# Patient Record
Sex: Male | Born: 1965 | Race: White | Hispanic: No | Smoking: Former smoker
Health system: Southern US, Community
[De-identification: ages and names within clinical notes are randomized; demographics above are authoritative.]

## PROBLEM LIST (undated history)

## (undated) DIAGNOSIS — D849 Immunodeficiency, unspecified: Secondary | ICD-10-CM

## (undated) DIAGNOSIS — I959 Hypotension, unspecified: Secondary | ICD-10-CM

## (undated) DIAGNOSIS — J154 Pneumonia due to other streptococci: Secondary | ICD-10-CM

## (undated) DIAGNOSIS — L738 Other specified follicular disorders: Secondary | ICD-10-CM

## (undated) DIAGNOSIS — J45909 Unspecified asthma, uncomplicated: Secondary | ICD-10-CM

## (undated) DIAGNOSIS — Z22322 Carrier or suspected carrier of Methicillin resistant Staphylococcus aureus: Secondary | ICD-10-CM

## (undated) DIAGNOSIS — Z21 Asymptomatic human immunodeficiency virus [HIV] infection status: Secondary | ICD-10-CM

## (undated) DIAGNOSIS — F329 Major depressive disorder, single episode, unspecified: Secondary | ICD-10-CM

## (undated) DIAGNOSIS — B20 Human immunodeficiency virus [HIV] disease: Secondary | ICD-10-CM

## (undated) DIAGNOSIS — A419 Sepsis, unspecified organism: Secondary | ICD-10-CM

## (undated) DIAGNOSIS — Z8619 Personal history of other infectious and parasitic diseases: Secondary | ICD-10-CM

## (undated) DIAGNOSIS — F32A Depression, unspecified: Secondary | ICD-10-CM

## (undated) DIAGNOSIS — E876 Hypokalemia: Secondary | ICD-10-CM

## (undated) DIAGNOSIS — M1990 Unspecified osteoarthritis, unspecified site: Secondary | ICD-10-CM

## (undated) DIAGNOSIS — Z86711 Personal history of pulmonary embolism: Secondary | ICD-10-CM

## (undated) DIAGNOSIS — F419 Anxiety disorder, unspecified: Secondary | ICD-10-CM

## (undated) HISTORY — DX: Hypokalemia: E87.6

## (undated) HISTORY — DX: Sepsis, unspecified organism: A41.9

## (undated) HISTORY — DX: Personal history of other infectious and parasitic diseases: Z86.19

## (undated) HISTORY — DX: Other specified follicular disorders: L73.8

## (undated) HISTORY — DX: Personal history of pulmonary embolism: Z86.711

## (undated) HISTORY — DX: Hypotension, unspecified: I95.9

---

## 2003-04-23 ENCOUNTER — Inpatient Hospital Stay (HOSPITAL_COMMUNITY): Admission: EM | Admit: 2003-04-23 | Discharge: 2003-04-27 | Payer: Self-pay | Admitting: *Deleted

## 2003-04-24 ENCOUNTER — Encounter: Payer: Self-pay | Admitting: Psychiatry

## 2008-11-27 ENCOUNTER — Emergency Department (HOSPITAL_BASED_OUTPATIENT_CLINIC_OR_DEPARTMENT_OTHER): Admission: EM | Admit: 2008-11-27 | Discharge: 2008-11-27 | Payer: Self-pay | Admitting: Emergency Medicine

## 2008-12-12 ENCOUNTER — Emergency Department (HOSPITAL_BASED_OUTPATIENT_CLINIC_OR_DEPARTMENT_OTHER): Admission: EM | Admit: 2008-12-12 | Discharge: 2008-12-12 | Payer: Self-pay | Admitting: Emergency Medicine

## 2009-09-19 ENCOUNTER — Emergency Department: Payer: Self-pay | Admitting: Unknown Physician Specialty

## 2010-04-06 ENCOUNTER — Encounter: Payer: Self-pay | Admitting: Emergency Medicine

## 2010-08-02 NOTE — Discharge Summary (Signed)
NAME:  [name], [name]                        ACCOUNT NO.:  [id number]   MEDICAL RECORD NO.:  [id number]                   PATIENT TYPE:  IPS   LOCATION:  0307                                 FACILITY:  [facility]   PHYSICIAN:  Jeanice Lim, M.D.              DATE OF BIRTH:  03/27/1965   DATE OF ADMISSION:  04/23/2003  DATE OF DISCHARGE:  04/27/2003                                 DISCHARGE SUMMARY   IDENTIFYING DATA:  This is a 45 year old Caucasian male, married,  involuntarily admitted status post potential overdose of 120 Ultrams,  reporting in the ER that he would be better off if he were dead.  The  patient had no recollection of this after being admitted to the hospital.  Had been using crack cocaine since age 64 and clean for a year at a time.  Current pattern was not clear.  Patient was minimizing any recent use,  stating that it was no longer a problem and again denied suicidal ideation  after being admitted, since the patient was an unreliable historian.   MEDICATIONS:  1. Keflex 500 mg b.i.d. for 10 days.  2. Rocephin.  3. Prozac 20 mg q.a.m. for depression; the patient had been off for 1 month.  4. Norvir 100 mg daily.   ALLERGIES:  No known drug allergies.   PHYSICAL EXAM:  The patient had a contusion of right eye and brow,  otherwise, within normal limits, neurologically nonfocal.   LABORATORY DATA:  Routine admission labs essentially within normal limits.   MEDICATIONS:  Medications include Videx, Reyataz, Dapsone and Flexeril.   MENTAL STATUS:  He seemed fully alert, pleasant, cooperative, claiming no  knowledge of events, no history of suicidal ideation, no history of previous  psychiatric hospitalizations; many of these facts turned out not to be  accurate when collateral history was obtained.  The patient's speech was  within normal limits.  Mood was reportedly euphoric, slightly irritable and  guarded.  The patient was evasive.  Thought process was  goal-directed,  negative for psychotic symptoms.  The patient denied dangerous ideation,  despite documented report of suicidal ideation in ER and now status post  overdose.  The patient did have a history of several overdoses in the past,  impulsivity often after using cocaine.  Wife was concerned about being safe  living with patient and the patient was not to return.  Patient would call  and threaten wife after talking with physician, despite denying any problems  in his marriage.  The patient was cognitively intact, judgment and insight  were poor and poor historian.  As per phone calls, the patient had  threatened to be violent towards wife, although the patient would deny this  when confronted.   ADMITTING DIAGNOSES:   AXES I:  1. Psychotic disorder, not otherwise specified.  2. Rule out steroid-induced psychosis.  3. Cocaine abuse.  4. Possible impulse control disorder.  AXIS II:  Questionable narcissistic antisocial traits.   AXIS III:  Status post Ultram overdose and seizure.   AXES IV:  1. Conflict at home, severe.  2. Severe stressors.  3. Limited support system.   AXIS V:  25/55.   HOSPITAL COURSE:  The patient was admitted, ordered routine p.r.n.  medications, underwent further monitoring and was encouraged to participate  in individual, group and milieu therapy.  The patient again minimized  cocaine use, reported no history of suicidal ideation, no history of  psychiatric treatment.  After collateral history was obtained, the patient  was confronted and the patient gradually reported that there had been some  cocaine use, some previous psychiatric hospitalizations, a history of  overdoses, although he still emphatically reported not recalling any events  prior to this hospitalization and not knowing why he was hospitalized.  The  patient, after being told he could not return home and that a restraining  order had been taken out, then agreed that he did need  treatment and that he  would do whatever was necessary to show that he was stable and abstinent  from substances and safe so that he would be able to have visitation with  child, if not returned home.  The patient was discharged to stay with  mother, who felt that the wife was part of the problem.  The patient  reported regret regarding overdose, remorse and intent to be compliant with  followup so that he would be able to see children, showing significantly  improved judgment and insight by the time of discharge.  The patient was  discharged in improved condition, mood more euthymic, affect brighter.  The  patient was more open and forthcoming with no dangerous ideation or  psychotic symptoms and motivation to remain abstinent, as per patient.   DISCHARGE MEDICATIONS:  He was discharged on:  1. Avelox 400 mg every 6 p.m.  2. Depakote ER 250 mg q.a.m. and 4 nightly.  3. Haldol 5 mg 1/2 q.a.m. and 1 nightly.  4. Ventolin inhaler 2 puffs q.6 h. p.r.n.  5. Vistaril 50 mg daily p.r.n.  6. Norvir 100 mg q.a.m.  7. Videx 100 mg 2-1/2 q.a.m.  8. Reyataz 150 mg q.a.m.  9. Dapsone 100 mg q.a.m.  10.      Prozac 20 mg q.a.m.   FOLLOWUP:  The patient is to follow up at [facility], Monday, May 01, 2003, at 10 a.m.   DISCHARGE DIAGNOSES:   AXES I:  1. Psychotic disorder, not otherwise specified.  2. Rule out steroid-induced psychosis.  3. Cocaine abuse.  4. Possible impulse control disorder.   AXIS II:  Questionable narcissistic antisocial traits.   AXIS III:  Status post Ultram overdose and seizure.   AXES IV:  1. Conflict at home, severe.  2. Severe stressors.  3. Limited support system.   AXIS V:  Global assessment of functioning on discharge was 55.                                               Jeanice Lim, M.D.    JEM/MEDQ  D:  05/13/2003  T:  05/15/2003  Job:  [id number]

## 2013-03-09 DIAGNOSIS — M87051 Idiopathic aseptic necrosis of right femur: Secondary | ICD-10-CM | POA: Insufficient documentation

## 2013-03-17 DIAGNOSIS — Z86711 Personal history of pulmonary embolism: Secondary | ICD-10-CM

## 2013-03-17 HISTORY — DX: Personal history of pulmonary embolism: Z86.711

## 2013-03-29 DIAGNOSIS — M160 Bilateral primary osteoarthritis of hip: Secondary | ICD-10-CM | POA: Insufficient documentation

## 2014-03-17 HISTORY — PX: OTHER SURGICAL HISTORY: SHX169

## 2015-01-24 DIAGNOSIS — B20 Human immunodeficiency virus [HIV] disease: Secondary | ICD-10-CM | POA: Insufficient documentation

## 2015-01-25 DIAGNOSIS — I959 Hypotension, unspecified: Secondary | ICD-10-CM

## 2015-01-25 DIAGNOSIS — K219 Gastro-esophageal reflux disease without esophagitis: Secondary | ICD-10-CM | POA: Insufficient documentation

## 2015-01-25 HISTORY — DX: Hypotension, unspecified: I95.9

## 2015-04-24 ENCOUNTER — Other Ambulatory Visit (HOSPITAL_COMMUNITY): Payer: Self-pay | Admitting: Orthopedic Surgery

## 2015-04-24 DIAGNOSIS — T849XXA Unspecified complication of internal orthopedic prosthetic device, implant and graft, initial encounter: Secondary | ICD-10-CM

## 2015-04-27 ENCOUNTER — Ambulatory Visit (HOSPITAL_COMMUNITY): Admission: RE | Admit: 2015-04-27 | Payer: Self-pay | Source: Ambulatory Visit

## 2015-04-27 DIAGNOSIS — F332 Major depressive disorder, recurrent severe without psychotic features: Secondary | ICD-10-CM | POA: Insufficient documentation

## 2015-05-02 ENCOUNTER — Other Ambulatory Visit (HOSPITAL_COMMUNITY): Payer: Self-pay | Admitting: Orthopedic Surgery

## 2015-05-02 ENCOUNTER — Ambulatory Visit (HOSPITAL_COMMUNITY): Admission: RE | Admit: 2015-05-02 | Payer: Self-pay | Source: Ambulatory Visit

## 2015-05-02 DIAGNOSIS — T849XXA Unspecified complication of internal orthopedic prosthetic device, implant and graft, initial encounter: Secondary | ICD-10-CM

## 2015-05-04 ENCOUNTER — Ambulatory Visit (HOSPITAL_COMMUNITY)
Admission: RE | Admit: 2015-05-04 | Discharge: 2015-05-04 | Disposition: A | Discharge status: Home / Self Care | Payer: Medicare Other | Source: Ambulatory Visit | Attending: Orthopedic Surgery | Admitting: Orthopedic Surgery

## 2015-05-04 DIAGNOSIS — M25552 Pain in left hip: Secondary | ICD-10-CM | POA: Insufficient documentation

## 2015-05-04 DIAGNOSIS — T849XXA Unspecified complication of internal orthopedic prosthetic device, implant and graft, initial encounter: Secondary | ICD-10-CM

## 2015-05-04 LAB — SYNOVIAL CELL COUNT + DIFF, W/ CRYSTALS
CRYSTALS FLUID: NONE SEEN
EOSINOPHILS-SYNOVIAL: 0 % (ref 0–1)
LYMPHOCYTES-SYNOVIAL FLD: 18 % (ref 0–20)
MONOCYTE-MACROPHAGE-SYNOVIAL FLUID: 19 % — AB (ref 50–90)
Neutrophil, Synovial: 63 % — ABNORMAL HIGH (ref 0–25)
WBC, Synovial: 500 /mm3 — ABNORMAL HIGH (ref 0–200)

## 2015-05-07 LAB — BODY FLUID CULTURE: CULTURE: NO GROWTH

## 2015-07-24 DIAGNOSIS — Z96649 Presence of unspecified artificial hip joint: Secondary | ICD-10-CM | POA: Insufficient documentation

## 2015-07-24 DIAGNOSIS — T859XXA Unspecified complication of internal prosthetic device, implant and graft, initial encounter: Secondary | ICD-10-CM | POA: Insufficient documentation

## 2016-03-19 DIAGNOSIS — Z01818 Encounter for other preprocedural examination: Secondary | ICD-10-CM | POA: Diagnosis not present

## 2016-04-10 DIAGNOSIS — Z96649 Presence of unspecified artificial hip joint: Secondary | ICD-10-CM | POA: Diagnosis not present

## 2016-04-10 DIAGNOSIS — B20 Human immunodeficiency virus [HIV] disease: Secondary | ICD-10-CM | POA: Diagnosis not present

## 2016-04-10 DIAGNOSIS — T8459XS Infection and inflammatory reaction due to other internal joint prosthesis, sequela: Secondary | ICD-10-CM | POA: Diagnosis not present

## 2016-11-21 DIAGNOSIS — Z882 Allergy status to sulfonamides status: Secondary | ICD-10-CM | POA: Diagnosis not present

## 2016-11-21 DIAGNOSIS — D72829 Elevated white blood cell count, unspecified: Secondary | ICD-10-CM | POA: Diagnosis not present

## 2016-11-21 DIAGNOSIS — Z79899 Other long term (current) drug therapy: Secondary | ICD-10-CM | POA: Diagnosis not present

## 2016-11-21 DIAGNOSIS — F17220 Nicotine dependence, chewing tobacco, uncomplicated: Secondary | ICD-10-CM | POA: Diagnosis not present

## 2016-11-21 DIAGNOSIS — Z86711 Personal history of pulmonary embolism: Secondary | ICD-10-CM | POA: Diagnosis not present

## 2016-11-21 DIAGNOSIS — B20 Human immunodeficiency virus [HIV] disease: Secondary | ICD-10-CM | POA: Diagnosis not present

## 2016-11-21 DIAGNOSIS — L97221 Non-pressure chronic ulcer of left calf limited to breakdown of skin: Secondary | ICD-10-CM | POA: Diagnosis not present

## 2016-11-21 DIAGNOSIS — J069 Acute upper respiratory infection, unspecified: Secondary | ICD-10-CM | POA: Diagnosis not present

## 2016-11-21 DIAGNOSIS — R05 Cough: Secondary | ICD-10-CM | POA: Diagnosis not present

## 2016-11-21 DIAGNOSIS — M7989 Other specified soft tissue disorders: Secondary | ICD-10-CM | POA: Diagnosis not present

## 2016-11-21 DIAGNOSIS — Z885 Allergy status to narcotic agent status: Secondary | ICD-10-CM | POA: Diagnosis not present

## 2017-01-29 ENCOUNTER — Emergency Department (HOSPITAL_COMMUNITY): Payer: Medicare Other

## 2017-01-29 ENCOUNTER — Other Ambulatory Visit: Payer: Self-pay

## 2017-01-29 ENCOUNTER — Encounter (HOSPITAL_COMMUNITY): Payer: Self-pay

## 2017-01-29 ENCOUNTER — Inpatient Hospital Stay (HOSPITAL_COMMUNITY)
Admission: EM | Admit: 2017-01-29 | Discharge: 2017-02-01 | DRG: 977 | Disposition: A | Discharge status: Home / Self Care | Payer: Medicare Other | Attending: Student in an Organized Health Care Education/Training Program | Admitting: Student in an Organized Health Care Education/Training Program

## 2017-01-29 DIAGNOSIS — T148XXA Other injury of unspecified body region, initial encounter: Secondary | ICD-10-CM

## 2017-01-29 DIAGNOSIS — D509 Iron deficiency anemia, unspecified: Secondary | ICD-10-CM | POA: Diagnosis present

## 2017-01-29 DIAGNOSIS — N492 Inflammatory disorders of scrotum: Secondary | ICD-10-CM | POA: Diagnosis not present

## 2017-01-29 DIAGNOSIS — D899 Disorder involving the immune mechanism, unspecified: Secondary | ICD-10-CM

## 2017-01-29 DIAGNOSIS — L739 Follicular disorder, unspecified: Secondary | ICD-10-CM | POA: Diagnosis present

## 2017-01-29 DIAGNOSIS — S81802A Unspecified open wound, left lower leg, initial encounter: Secondary | ICD-10-CM | POA: Diagnosis not present

## 2017-01-29 DIAGNOSIS — B999 Unspecified infectious disease: Secondary | ICD-10-CM | POA: Insufficient documentation

## 2017-01-29 DIAGNOSIS — Z91128 Patient's intentional underdosing of medication regimen for other reason: Secondary | ICD-10-CM

## 2017-01-29 DIAGNOSIS — Z23 Encounter for immunization: Secondary | ICD-10-CM

## 2017-01-29 DIAGNOSIS — Z885 Allergy status to narcotic agent status: Secondary | ICD-10-CM

## 2017-01-29 DIAGNOSIS — Z8614 Personal history of Methicillin resistant Staphylococcus aureus infection: Secondary | ICD-10-CM

## 2017-01-29 DIAGNOSIS — R238 Other skin changes: Secondary | ICD-10-CM

## 2017-01-29 DIAGNOSIS — B20 Human immunodeficiency virus [HIV] disease: Secondary | ICD-10-CM | POA: Diagnosis not present

## 2017-01-29 DIAGNOSIS — L738 Other specified follicular disorders: Secondary | ICD-10-CM | POA: Diagnosis present

## 2017-01-29 DIAGNOSIS — Z882 Allergy status to sulfonamides status: Secondary | ICD-10-CM

## 2017-01-29 DIAGNOSIS — L0390 Cellulitis, unspecified: Secondary | ICD-10-CM

## 2017-01-29 DIAGNOSIS — E876 Hypokalemia: Secondary | ICD-10-CM | POA: Diagnosis present

## 2017-01-29 DIAGNOSIS — T375X6A Underdosing of antiviral drugs, initial encounter: Secondary | ICD-10-CM | POA: Diagnosis present

## 2017-01-29 DIAGNOSIS — D849 Immunodeficiency, unspecified: Secondary | ICD-10-CM

## 2017-01-29 DIAGNOSIS — Z96642 Presence of left artificial hip joint: Secondary | ICD-10-CM | POA: Diagnosis present

## 2017-01-29 DIAGNOSIS — L989 Disorder of the skin and subcutaneous tissue, unspecified: Secondary | ICD-10-CM | POA: Diagnosis not present

## 2017-01-29 DIAGNOSIS — M79605 Pain in left leg: Secondary | ICD-10-CM | POA: Diagnosis not present

## 2017-01-29 HISTORY — DX: Asymptomatic human immunodeficiency virus (hiv) infection status: Z21

## 2017-01-29 HISTORY — DX: Human immunodeficiency virus (HIV) disease: B20

## 2017-01-29 HISTORY — DX: Carrier or suspected carrier of methicillin resistant Staphylococcus aureus: Z22.322

## 2017-01-29 LAB — CBC WITH DIFFERENTIAL/PLATELET
BASOS ABS: 0 10*3/uL (ref 0.0–0.1)
Basophils Relative: 0 %
EOS ABS: 0.5 10*3/uL (ref 0.0–0.7)
EOS PCT: 10 %
HCT: 35.1 % — ABNORMAL LOW (ref 39.0–52.0)
Hemoglobin: 11.9 g/dL — ABNORMAL LOW (ref 13.0–17.0)
LYMPHS PCT: 30 %
Lymphs Abs: 1.7 10*3/uL (ref 0.7–4.0)
MCH: 26.6 pg (ref 26.0–34.0)
MCHC: 33.9 g/dL (ref 30.0–36.0)
MCV: 78.5 fL (ref 78.0–100.0)
Monocytes Absolute: 0.6 10*3/uL (ref 0.1–1.0)
Monocytes Relative: 11 %
NEUTROS PCT: 49 %
Neutro Abs: 2.7 10*3/uL (ref 1.7–7.7)
Platelets: 190 10*3/uL (ref 150–400)
RBC: 4.47 MIL/uL (ref 4.22–5.81)
RDW: 16.9 % — AB (ref 11.5–15.5)
WBC: 5.5 10*3/uL (ref 4.0–10.5)

## 2017-01-29 LAB — COMPREHENSIVE METABOLIC PANEL
ALT: 17 U/L (ref 17–63)
AST: 22 U/L (ref 15–41)
Albumin: 3.5 g/dL (ref 3.5–5.0)
Alkaline Phosphatase: 73 U/L (ref 38–126)
Anion gap: 9 (ref 5–15)
BILIRUBIN TOTAL: 0.6 mg/dL (ref 0.3–1.2)
BUN: 9 mg/dL (ref 6–20)
CO2: 18 mmol/L — ABNORMAL LOW (ref 22–32)
CREATININE: 1.27 mg/dL — AB (ref 0.61–1.24)
Calcium: 8.6 mg/dL — ABNORMAL LOW (ref 8.9–10.3)
Chloride: 110 mmol/L (ref 101–111)
Glucose, Bld: 104 mg/dL — ABNORMAL HIGH (ref 65–99)
POTASSIUM: 3.1 mmol/L — AB (ref 3.5–5.1)
Sodium: 137 mmol/L (ref 135–145)
TOTAL PROTEIN: 7.8 g/dL (ref 6.5–8.1)

## 2017-01-29 LAB — I-STAT CG4 LACTIC ACID, ED: Lactic Acid, Venous: 1.27 mmol/L (ref 0.5–1.9)

## 2017-01-29 MED ORDER — SODIUM CHLORIDE 0.9 % IV BOLUS (SEPSIS)
1000.0000 mL | Freq: Once | INTRAVENOUS | Status: AC
  Administered 2017-01-30: 1000 mL via INTRAVENOUS

## 2017-01-29 MED ORDER — PIPERACILLIN-TAZOBACTAM 3.375 G IVPB 30 MIN
3.3750 g | Freq: Once | INTRAVENOUS | Status: AC
  Administered 2017-01-30: 3.375 g via INTRAVENOUS
  Filled 2017-01-29: qty 50

## 2017-01-29 MED ORDER — VANCOMYCIN HCL 10 G IV SOLR
1250.0000 mg | Freq: Once | INTRAVENOUS | Status: AC
  Administered 2017-01-30: 1250 mg via INTRAVENOUS
  Filled 2017-01-29: qty 1250

## 2017-01-29 MED ORDER — POTASSIUM CHLORIDE CRYS ER 20 MEQ PO TBCR
40.0000 meq | EXTENDED_RELEASE_TABLET | Freq: Once | ORAL | Status: AC
  Administered 2017-01-30: 40 meq via ORAL
  Filled 2017-01-29: qty 2

## 2017-01-29 NOTE — H&P (Signed)
Date: 01/30/2017               Patient Name:  [name] MRN: [id number]  DOB: 04/07/1965 Age / Sex: 51 y.o., male   PCP: Patient, No Pcp Per         Medical Service: Internal Medicine Teaching Service         Attending Physician: Dr. Rogelia BogaButcher, Joseph MilesElizabeth A, MD    First Contact: Dr. Levora DredgeJustin Helberg Pager: [phone]  Second Contact: Dr. Reymundo Pollarolyn Guilloud Pager: [phone]       After Hours (After 5p/  First Contact Pager: [phone]  weekends / holidays): Second Contact Pager: [phone]   Chief Complaint:  Generalized painful skin lesions  History of Present Illness:  [name] is Haas 51 yo with PMH of HIV and culture positive MRSA in 2009 who presents with Haas multi-month history of lower extremity wound and Haas 2-3 day history of multiple, painful lesions on his upper and lower extremities. The patient states that he first noticed the lesion on the shin of his left lower extremity back in April 2018. He denies trauma to the area and states that it looked as if he had developed another MRSA infection. To treat this skin infection, he performed an I&D on himself and he noticed Haas lot of pus coming from the area. Since the initial I&D the wound intermittently drains serosanguinous fluid and usually scabs over with Haas black scab. The scab usually falls off over time. This lesion is very painful and not typically pruritic. He describes the pain as Haas throbbing pain that occurs every time the blood rushes through his veins. The patient has been cleaning the wound with hydrogen peroxide and dressing the wound with Haas Band-Aid and antibacterial ointment.   Over the past 2-3 days he has noticed more and more bumps, that are similar to the first one that started on his LLE. These bumps are smaller than the one he noticed in April, but more widespread. They are mainly on his lower trunk, groin area, upper thighs, and forearms. The patient states that when Haas bump first starts it looks like Haas MRSA infection,  so he usually does an I&D on himself to drain the pus. Then he notices some worsening swelling in the area and serosanguinous discharge. After the I&D he generally notices new lesions popping up in the area around the original bump. When these widespread bumps started developing, the patient also noticed subjective fevers, chills, widespread arthralgias (particularly in LE), and myalgias. He denies chest pain, SOB, cough, congestion, nausea, vomiting and abdominal pain.  Patient has history of HIV but has not received treatment for at least two years, because he lost his primary care physician and ran out of refills on his medication. He does not regularly follow up with an infectious disease provider.  Upon arrival to the ED, the patient was afebrile, nontachycardic, normotensive in 100s/70s, and saturating >96% on room air. The patient's lactate was 1.27 and WBC count was 5.5. Blood cultures were obtained, vancomycin and zosyn was ordered, and he had Haas LLE Xray which did not show acute fractures, soft tissue swelling, or radiopaque foreign bodies. IMTS was called for admission.  Meds:  Current Meds  Medication Sig  . ibuprofen (ADVIL,MOTRIN) 200 MG tablet Take 800 mg every 4 (four) hours as needed by mouth for mild pain.   Allergies: Allergies as of 01/29/2017 - Review Complete 01/29/2017  Allergen Reaction Noted  . Sulfa antibiotics Rash 01/29/2017  Past Medical History: Past Medical History:  Diagnosis Date  . HIV (human immunodeficiency virus infection) (HCC)   . MRSA (methicillin resistant staph aureus) culture positive    Past Surgical History: Past Surgical History:  Procedure Laterality Date  . 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 No pertinent family history.  Social History:  Patient lives with Fiance who also has HIV. Patient endorses chewing tobacco use for decades. Denies  cigarette use, alcohol use, and IV drug use.   Review of Systems: Haas complete ROS was negative except as per HPI.  Physical Exam: Blood pressure 119/84, pulse (!) 52, temperature 98.1 F (36.7 C), temperature source Oral, resp. rate 18, height 6\' 1"  (1.854 m), weight 150 lb (68 kg), SpO2 100 %.  Physical Exam  Constitutional: He appears well-developed and well-nourished. No distress.  HENT:  Mouth/Throat: Oropharynx is clear and moist.  Cardiovascular: Normal rate, regular rhythm and normal heart sounds.  No murmur heard. 2+ radial pulses, 2+ posterior tibial, 2+ dorsalis pedis pulses bilaterally  Respiratory: Effort normal. No respiratory distress. He has no wheezes.  No crackles  GI: Soft. Bowel sounds are normal. He exhibits no distension. There is no tenderness. There is no rebound.  Musculoskeletal: He exhibits tenderness (of bilateral knees and ankles). He exhibits no edema (of bilateral knees, ankles) or deformity (of bilateral knees, ankles).  Lymphadenopathy:    He has no cervical adenopathy.  Neurological:  Gross upper extremity strength intact bilaterally. 5/5 dorsiflexion and plantarflexion of bilateral lower extremities. Gross sensation to light touch of upper and lower extremities intact bilaterally.  Skin: He is not diaphoretic.  Patient has multiple <1 cm pustular lesions in various stages of healing throughout trunk, groin, upper thighs, and forearms bilaterally. There are few larger, swollen lesions on forearms some with central ulcerations draining serosanguinous fluid. On his LLE there is Haas 1-2cm wound with dark central ulceration and surrounding erythema that is painful to the touch.        Assessment & Plan by Problem: Active Problems:   Infectious folliculitis  [name] is Haas 51 yo with PMH of HIV and culture positive MRSA in 2009 who presents with Haas multi-month history of lower extremity wound and Haas 2-3 day history of multiple, smaller lesions on his  torso, upper, and lower extremities, consistent with infectious folliculitis on physical exam. The patient was admitted to the internal medicine teaching service for management. The specific problems addressed on admission are as follows:  Infectious folliculitis: The patient has Haas multi-month history of one purulent wound on LLE and recent history of multiple, smaller purulent wounds on his torso, upper, and lower extremities. These wounds are in various stages of healing and, per patient's report, have been intermittently scratched and/or lanced on by the patient over the past few days. Given the patient's history of MRSA and the findings of small pustular lesions surrounding hair follicles on exam, it is likely that the patient's wounds are Haas result of bacterial folliculitis (with MRSA more likely than strep given hx and exam). This rash is less likely to be Haas viral folliculitis such as molluscum contagiosum or herpes zoster, since lesions do not have characteristic appearance/distribution on physical exam. It is also less likely that these lesions are Haas result of eosinophilic folliculitis given that they are nonpruritic, but this should be considered if patient does not clinically improve with antibiotics since HIV untreated  and last CD4 count on file <200. He was given broad spectrum IV antibiotics in ED, however patient currently appears stable for transition to PO medication. Antibiotics will likely need to cover MRSA. Patient did not report history of swimming pools/water exposure to suggest gram negative coverage needed, however since immune compromised this could be considered.  -Consider transition to doxycycline +/- ciprofloxacin or ampicillin  -Consult wound care -BMP in AM -Follow up blood cultures taken on admission  HIV: Patient diagnosed with HIV in mid 90s. He has been off medication for almost two years since he is no longer receiving care in [place]. Per chart review last HIV viral load  in 04/11/2016 = 143,000 and last CD4 count in 11/2016 = 136. Patient will need new ID physician to manage medications once leaving the hospital.  -Follow up CD4 count and HIV RNA obtained on admission -Consider ID consult and/or [place]  Fluids: None Electrolytes: K = 3.1, 40 mEq given Nutrition: Regular  VTE prophylaxis: Lovenox Code status: DNR  Dispo: Admit patient to Observation with expected length of stay less than 2 midnights.  SignedRozann Lesches: Frederico Gerling, MD 01/30/2017, 1:15 AM  Pager: [phone]

## 2017-01-29 NOTE — ED Triage Notes (Signed)
Pt endorses having a hx of mrsa, pt has sore to left lower leg that has been there since April and now pt has sores all over. VSS. Afebrile.

## 2017-01-29 NOTE — ED Provider Notes (Signed)
[facility] EMERGENCY [facility] Provider Note   CSN: [id number] Arrival date & time: 01/29/17  1905     History   Chief Complaint Chief Complaint  Patient presents with  . Recurrent Skin Infections    HPI [name] is a 51 y.o. male.  HPI  51 year old male with a history of HIV off of medications for the last 7 months, last CD4 136 9/10, history of left hip prosthetic hip septic arthritis, presents with concern for multiple skin lesions.  Reports that since April, he has had a skin wound to his left tibia which has been nonhealing, and appears black at times.  Reports a throbbing pain in that leg which has been worsening over the last few months.  Reports occasional drainage from the wound.  Reports that over the last 2-3 days he has developed multiple skin lesions across his bilateral arms, abdomen, and some areas on his legs.  These areas are painful.  2 of them he broke open and it is pus draining from the wound.  Reports subjective fevers at home off and on for the last 4 days.  Denies history of IV drug use.  Denies nausea, vomiting, abdominal pain, or other concerns.  Reports he has a history of MRSA.  Denies pain in his new prosthetic hip.    Past Medical History:  Diagnosis Date  . HIV (human immunodeficiency virus infection) (HCC)   . MRSA (methicillin resistant staph aureus) culture positive     There are no active problems to display for this patient.   Past Surgical History:  Procedure Laterality Date  . hip arthroplasty Left 2016   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, I&D with replacement in 2017       Home Medications    Prior to Admission medications   Not on File    Family History History reviewed. No pertinent family history.  Social History Social History   Tobacco Use  . Smoking status: Never Smoker  Substance Use Topics  . Alcohol use: No    Frequency: Never  . Drug use: No     Allergies   Sulfa  antibiotics   Review of Systems Review of Systems  Constitutional: Positive for chills, fatigue, fever and unexpected weight change (40lb in last few months).  HENT: Negative for sore throat.   Eyes: Negative for visual disturbance.  Respiratory: Negative for shortness of breath.   Cardiovascular: Negative for chest pain.  Gastrointestinal: Negative for abdominal pain, nausea and vomiting.  Genitourinary: Negative for difficulty urinating.  Musculoskeletal: Positive for arthralgias. Negative for back pain and neck stiffness.  Skin: Positive for rash and wound.  Neurological: Negative for syncope and headaches.     Physical Exam Updated Vital Signs BP 108/75 (BP Location: Right Arm)   Pulse 65   Temp 98.1 F (36.7 C) (Oral)   Resp 18   Ht 6\' 1"  (1.854 m)   Wt 68 kg (150 lb)   SpO2 96%   BMI 19.79 kg/m   Physical Exam  Constitutional: He is oriented to person, place, and time. He appears well-developed and well-nourished. No distress.  HENT:  Head: Normocephalic and atraumatic.  Eyes: Conjunctivae and EOM are normal.  Neck: Normal range of motion.  Cardiovascular: Normal rate, regular rhythm, normal heart sounds and intact distal pulses. Exam reveals no gallop and no friction rub.  No murmur heard. Pulmonary/Chest: Effort normal and breath sounds normal. No respiratory distress. He has no wheezes. He has no rales.  Abdominal: Soft. He exhibits no distension. There is no tenderness. There is no guarding.  Genitourinary:  Genitourinary Comments: Small nodule on scrotum, no sign of scrotal abscess, testes WNL Sacrum with erythema  Musculoskeletal: He exhibits no edema.  Tenderness around left tibia 2cm wound with black eschar, no surrounding fluctuance  Neurological: He is alert and oriented to person, place, and time.  Skin: Skin is warm and dry. He is not diaphoretic. There is erythema (groin).  Folliculitis pubis, erythema in groin Multiple erythematous papules,  cellulitis, small abscesses bilateral arms, legs, ulcerations, scabs, right forearm with 3cmx3cm area of erythema and induration, central ulceration, no fluctuance  Nursing note and vitals reviewed.    ED Treatments / Results  Labs (all labs ordered are listed, but only abnormal results are displayed) Labs Reviewed  COMPREHENSIVE METABOLIC PANEL - Abnormal; Notable for the following components:      Result Value   Potassium 3.1 (*)    CO2 18 (*)    Glucose, Bld 104 (*)    Creatinine, Ser 1.27 (*)    Calcium 8.6 (*)    All other components within normal limits  CBC WITH DIFFERENTIAL/PLATELET - Abnormal; Notable for the following components:   Hemoglobin 11.9 (*)    HCT 35.1 (*)    RDW 16.9 (*)    All other components within normal limits  CULTURE, BLOOD (ROUTINE X 2)  CULTURE, BLOOD (ROUTINE X 2)  T-HELPER CELLS (CD4) COUNT (NOT AT [facility])  HIV 1 RNA QUANT-NO REFLEX-BLD  I-STAT CG4 LACTIC ACID, ED    EKG  EKG Interpretation None       Radiology No results found.  Procedures Procedures (including critical care time)  Medications Ordered in ED Medications  piperacillin-tazobactam (ZOSYN) IVPB 3.375 g (not administered)  potassium chloride SA (K-DUR,KLOR-CON) CR tablet 40 mEq (not administered)  sodium chloride 0.9 % bolus 1,000 mL (not administered)  vancomycin (VANCOCIN) 1,250 mg in sodium chloride 0.9 % 250 mL IVPB (not administered)  vancomycin (VANCOCIN) IVPB 750 mg/150 ml premix (not administered)     Initial Impression / Assessment and Plan / ED Course  I have reviewed the triage vital signs and the nursing notes.  Pertinent labs & imaging results that were available during my care of the patient were reviewed by me and considered in my medical decision making (see chart for details).    51 year old male with a history of HIV off of medications for the last 7 months, last CD4 136 9/10, history of left hip prosthetic hip septic arthritis, presents with  concern for multiple skin lesions.  He has multiple skin lesions, persistent with small areas of folliculitis, cellulitis, and small abscesses.  A few lesions he had already drained, and there are no significant areas of fluctuance at this time for bedside incision and drainage, no sign of significant hand involvement or abscess.  He reports that these lesions are spontaneously appearing.  He has not nonhealing wounds on his left tibia, with severe throbbing pain, and I have some clinical concern for possible osteomyelitis. XR without findings, would consider inpt MRI.    Given patient's history of immunocompromise, multiple skin wounds, subjective fevers, will admit for blood cultures, IV antibiotics, and further evaluation for possible osteomyelitis of his left tibia.  He does not show signs of sepsis.  He is given vancomycin and Zosyn.  Admitted to internal medicine for further care.  Final Clinical Impressions(s) / ED Diagnoses   Final diagnoses:  Cellulitis, unspecified cellulitis site  Multiple wounds of skin  Immunocompromised [facility](HCC)    ED Discharge Orders    None       Alvira MondaySchlossman, Peniel Hass, MD 01/30/17 [phone]

## 2017-01-29 NOTE — Progress Notes (Signed)
Pharmacy Antibiotic Note  Joseph ReekLarry D Macaulay is a 51 y.o. male admitted on 01/29/2017 with chronic wound infection and noted history of MRSA per patient report.  Pharmacy has been consulted for Vancomycin dosing.  Temp and WBC wnl. Scr 1.27 for estimated CrCl ~ 65 mL/min.   Patient received zosyn 3.375g IV x1 in the ED.   Plan: Vancomycin 1250 mg IV x1, then 750 mg IV q12hr  Vancomycin trough at SS and PRN (goal ~15 mcg/mL) Monitor renal function, clinical picture, and culture data F/u length of therapy   Height: 6\' 1"  (185.4 cm) Weight: 150 lb (68 kg) IBW/kg (Calculated) : 79.9  Temp (24hrs), Avg:98.1 F (36.7 C), Min:98.1 F (36.7 C), Max:98.1 F (36.7 C)  Recent Labs  Lab 01/29/17 1921 01/29/17 2007  WBC 5.5  --   CREATININE 1.27*  --   LATICACIDVEN  --  1.27    Estimated Creatinine Clearance: 67.7 mL/min (A) (by C-G formula based on SCr of 1.27 mg/dL (H)).    Allergies  Allergen Reactions  . Sulfa Antibiotics Rash    Antimicrobials this admission: 11/15 Zosyn x1 11/15 Vanc >>   Microbiology results: pending   Einar CrowKatherine Weigle, PharmD Clinical Pharmacist 01/29/17 11:29 PM

## 2017-01-30 DIAGNOSIS — Z8614 Personal history of Methicillin resistant Staphylococcus aureus infection: Secondary | ICD-10-CM | POA: Diagnosis not present

## 2017-01-30 DIAGNOSIS — B20 Human immunodeficiency virus [HIV] disease: Secondary | ICD-10-CM | POA: Diagnosis not present

## 2017-01-30 DIAGNOSIS — R21 Rash and other nonspecific skin eruption: Secondary | ICD-10-CM

## 2017-01-30 DIAGNOSIS — D509 Iron deficiency anemia, unspecified: Secondary | ICD-10-CM | POA: Diagnosis not present

## 2017-01-30 DIAGNOSIS — Z96642 Presence of left artificial hip joint: Secondary | ICD-10-CM | POA: Diagnosis not present

## 2017-01-30 DIAGNOSIS — L738 Other specified follicular disorders: Secondary | ICD-10-CM | POA: Insufficient documentation

## 2017-01-30 DIAGNOSIS — Z8619 Personal history of other infectious and parasitic diseases: Secondary | ICD-10-CM | POA: Diagnosis not present

## 2017-01-30 DIAGNOSIS — B999 Unspecified infectious disease: Secondary | ICD-10-CM

## 2017-01-30 DIAGNOSIS — L739 Follicular disorder, unspecified: Secondary | ICD-10-CM | POA: Diagnosis not present

## 2017-01-30 DIAGNOSIS — Z21 Asymptomatic human immunodeficiency virus [HIV] infection status: Secondary | ICD-10-CM

## 2017-01-30 DIAGNOSIS — Z882 Allergy status to sulfonamides status: Secondary | ICD-10-CM

## 2017-01-30 DIAGNOSIS — Z66 Do not resuscitate: Secondary | ICD-10-CM | POA: Diagnosis not present

## 2017-01-30 DIAGNOSIS — E876 Hypokalemia: Secondary | ICD-10-CM

## 2017-01-30 DIAGNOSIS — F17220 Nicotine dependence, chewing tobacco, uncomplicated: Secondary | ICD-10-CM

## 2017-01-30 DIAGNOSIS — L989 Disorder of the skin and subcutaneous tissue, unspecified: Secondary | ICD-10-CM

## 2017-01-30 DIAGNOSIS — Z23 Encounter for immunization: Secondary | ICD-10-CM | POA: Diagnosis not present

## 2017-01-30 DIAGNOSIS — Z885 Allergy status to narcotic agent status: Secondary | ICD-10-CM | POA: Diagnosis not present

## 2017-01-30 DIAGNOSIS — Z91128 Patient's intentional underdosing of medication regimen for other reason: Secondary | ICD-10-CM | POA: Diagnosis not present

## 2017-01-30 DIAGNOSIS — T375X6A Underdosing of antiviral drugs, initial encounter: Secondary | ICD-10-CM | POA: Diagnosis present

## 2017-01-30 DIAGNOSIS — L986 Other infiltrative disorders of the skin and subcutaneous tissue: Secondary | ICD-10-CM | POA: Diagnosis not present

## 2017-01-30 DIAGNOSIS — Z888 Allergy status to other drugs, medicaments and biological substances status: Secondary | ICD-10-CM

## 2017-01-30 HISTORY — DX: Other specified follicular disorders: L73.8

## 2017-01-30 HISTORY — DX: Unspecified infectious disease: B99.9

## 2017-01-30 LAB — CBC
HCT: 32.5 % — ABNORMAL LOW (ref 39.0–52.0)
Hemoglobin: 10.8 g/dL — ABNORMAL LOW (ref 13.0–17.0)
MCH: 26 pg (ref 26.0–34.0)
MCHC: 33.2 g/dL (ref 30.0–36.0)
MCV: 78.3 fL (ref 78.0–100.0)
PLATELETS: 145 10*3/uL — AB (ref 150–400)
RBC: 4.15 MIL/uL — AB (ref 4.22–5.81)
RDW: 16.9 % — AB (ref 11.5–15.5)
WBC: 3.8 10*3/uL — AB (ref 4.0–10.5)

## 2017-01-30 LAB — T-HELPER CELLS (CD4) COUNT (NOT AT ARMC)
CD4 % Helper T Cell: 10 % — ABNORMAL LOW (ref 33–55)
CD4 T Cell Abs: 150 /uL — ABNORMAL LOW (ref 400–2700)

## 2017-01-30 LAB — BASIC METABOLIC PANEL
Anion gap: 5 (ref 5–15)
BUN: 9 mg/dL (ref 6–20)
CALCIUM: 8.3 mg/dL — AB (ref 8.9–10.3)
CO2: 20 mmol/L — ABNORMAL LOW (ref 22–32)
CREATININE: 1.11 mg/dL (ref 0.61–1.24)
Chloride: 114 mmol/L — ABNORMAL HIGH (ref 101–111)
GFR calc non Af Amer: >60 mL/min (ref >60)
Glucose, Bld: 108 mg/dL — ABNORMAL HIGH (ref 65–99)
Potassium: 3.1 mmol/L — ABNORMAL LOW (ref 3.5–5.1)
SODIUM: 139 mmol/L (ref 135–145)

## 2017-01-30 LAB — MAGNESIUM: Magnesium: 1.8 mg/dL (ref 1.7–2.4)

## 2017-01-30 LAB — RPR: RPR Ser Ql: NONREACTIVE

## 2017-01-30 MED ORDER — DOXYCYCLINE HYCLATE 100 MG PO TABS
100.0000 mg | ORAL_TABLET | Freq: Two times a day (BID) | ORAL | Status: AC
  Administered 2017-01-30 – 2017-02-01 (×5): 100 mg via ORAL
  Filled 2017-01-30 (×5): qty 1

## 2017-01-30 MED ORDER — ACETAMINOPHEN 325 MG PO TABS
650.0000 mg | ORAL_TABLET | Freq: Four times a day (QID) | ORAL | Status: DC | PRN
  Administered 2017-01-30: 650 mg via ORAL
  Filled 2017-01-30: qty 2

## 2017-01-30 MED ORDER — DARUNAVIR-COBICISTAT 800-150 MG PO TABS: 1.0000 | ORAL_TABLET | Freq: Every day | ORAL | Status: DC

## 2017-01-30 MED ORDER — FLUCONAZOLE 100 MG PO TABS
200.0000 mg | ORAL_TABLET | Freq: Every day | ORAL | Status: DC
  Administered 2017-01-30 – 2017-02-01 (×3): 200 mg via ORAL
  Filled 2017-01-30 (×4): qty 2

## 2017-01-30 MED ORDER — VANCOMYCIN HCL IN DEXTROSE 750-5 MG/150ML-% IV SOLN
750.0000 mg | Freq: Two times a day (BID) | INTRAVENOUS | Status: DC
  Filled 2017-01-30 (×2): qty 150

## 2017-01-30 MED ORDER — ATOVAQUONE 750 MG/5ML PO SUSP
1500.0000 mg | Freq: Every day | ORAL | Status: DC
  Administered 2017-01-30 – 2017-02-01 (×3): 1500 mg via ORAL
  Filled 2017-01-30 (×3): qty 10

## 2017-01-30 MED ORDER — DARUNAVIR-COBICISTAT 800-150 MG PO TABS
1.0000 | ORAL_TABLET | Freq: Every day | ORAL | Status: DC
  Administered 2017-01-30 – 2017-02-01 (×3): 1 via ORAL
  Filled 2017-01-30 (×3): qty 1

## 2017-01-30 MED ORDER — ENOXAPARIN SODIUM 40 MG/0.4ML ~~LOC~~ SOLN
40.0000 mg | SUBCUTANEOUS | Status: DC
  Filled 2017-01-30 (×2): qty 0.4

## 2017-01-30 MED ORDER — ACETAMINOPHEN 650 MG RE SUPP
650.0000 mg | Freq: Four times a day (QID) | RECTAL | Status: DC | PRN
Start: 2017-01-30 — End: 2017-02-01

## 2017-01-30 MED ORDER — EMTRICITABINE-TENOFOVIR AF 200-25 MG PO TABS
1.0000 | ORAL_TABLET | Freq: Every day | ORAL | Status: DC
  Administered 2017-01-30 – 2017-02-01 (×3): 1 via ORAL
  Filled 2017-01-30 (×3): qty 1

## 2017-01-30 MED ORDER — IBUPROFEN 600 MG PO TABS
600.0000 mg | ORAL_TABLET | Freq: Three times a day (TID) | ORAL | Status: DC | PRN
  Administered 2017-01-30 – 2017-02-01 (×5): 600 mg via ORAL
  Filled 2017-01-30 (×5): qty 1

## 2017-01-30 MED ORDER — POTASSIUM CHLORIDE CRYS ER 20 MEQ PO TBCR
40.0000 meq | EXTENDED_RELEASE_TABLET | Freq: Two times a day (BID) | ORAL | Status: AC
  Administered 2017-01-30 (×2): 40 meq via ORAL
  Filled 2017-01-30 (×2): qty 2

## 2017-01-30 NOTE — Progress Notes (Signed)
Patient received from ED via wheelchair.  patient is alert and oriented.vital signs are stable. Skin assessment done with another nurse. Given medicine for pain. Patient given instruction about call bell, phone and unit routine. Bed in low position and side rail up x2. Call bell and phone within reach.

## 2017-01-30 NOTE — Care Management Note (Addendum)
Case Management Note  Patient Details  Name: Lesle ReekLarry D Novacek MRN: [id number] Date of Birth: 05/17/1965  Subjective/Objective:        Presents with infectious folliculitis.  PMH of HIV and culture positive MRSA '09. From home with fiance. PTA independent with ADL's, no DME usage. Pt states currently without PCP or ID MD. Marchia MeiersFiance currently is hospitalized on same floor in room 5021.            Arabella MerlesKimberly  Elkins      (fiance')         Action/Plan: Return to home when medically stable...Marland Kitchen.CM following for disposition needs.  Post hospital follow up scheduled for 02/16/2017 at 1pm with Julianne HandlerLaChina Hollis NP [facility].  Expected Discharge Date:                  Expected Discharge Plan:  Home/Self Care  In-House Referral:     Discharge planning Services  CM Consult  Post Acute Care Choice:    Choice offered to:     DME Arranged:    DME Agency:     HH Arranged:    HH Agency:     Status of Service:  In process, will continue to follow  If discussed at Long Length of Stay Meetings, dates discussed:    Additional Comments:  Epifanio LeschesCole, Nesha Counihan Hudson, RN 01/30/2017, 8:48 AM

## 2017-01-30 NOTE — Consult Note (Signed)
[facility]   Reason for Consult: Diffuse rash, Untreated HIV    Referring Physician: Helberg  Active Problems:   Infectious folliculitis  . enoxaparin (LOVENOX) injection  40 mg Subcutaneous Q24H  . potassium chloride  40 mEq Oral BID   Recommendations: Discontinue vancomycin, no indication for antibiotics at this time.  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 Rash preceded by prodrome of fevers, chills, night sweats and sore throat. Rash started in groin, described as wet, and now on BL forearms, lower trunk and thighs. Suspect current cutaneous infection related to poor immune status. Doubt MRSA infection, likely fungal. Will treat both inguinal candidal infection & cutaneous infection with fluconazole for now, narrow based on biopsy result. Suspect resolution with improved immune status.  Antibiotics: Vancomycin 11/15 >>  HPI: [name] is a 51 y.o. male with untreated HIV >1, hx of MRSA abscesses, hx of L hip arthroplasty with periprosthetic infection 5/17 here with diffuse rash x3 days. He first noted a "wet" rash in his groin 1 week ago however has since spread to his lower trunk, upper thighs and forearms. States the rash is very painful and feels like a burning, throbbing pain. Worsens with palpation, "when the blood passes by it" and feels similar to when he had shingles. Tried to I&D one at home and removed a "white seed" with purulent & red discharge. Describes feeling feverish, night sweats and sore throat prior to rash onset.   He does have a non-healing LLE shin  wound first noticed as a small bump 4/18. He performed self I&D which yielded "lots" of purulent material per patient. Since then, the wound intermittently drains serosanguinous fluid and scabs over with a black scab which falls off over time. Describes the lesion as "very painful" throbbing pain. Has been cleaning wound at home with H2O2 and antibacterial ointment.   Since admission he has been afebrile and without leukocytosis. Blood cultures obtained and he was started on empiric Vancomycin. Swab of lesion swabbed and currently being evaluated for HSV1, HSV2 and VZV. Of note, his partner is +HSV1  Review of Systems: All other systems reviewed and are negative    Past Medical History:  Diagnosis Date  . HIV (human immunodeficiency virus infection) (HCC)   . MRSA (methicillin resistant staph aureus) culture positive    Social History   Tobacco Use  . Smoking status: Never Smoker  Substance Use Topics  . Alcohol use: No    Frequency: Never  . Drug use: No   History reviewed. No pertinent family history.  Allergies  Allergen Reactions  . Sulfa Antibiotics Rash  . Tramadol Nausea Only   Physical Exam: Constitutional: Pleasant caucasian male resting comfortably in bed. NAD.  Vitals:   01/30/17 0205 01/30/17 0521  BP: 114/66 103/65  Pulse: (!) 58 (!) 56  Resp: 18 18  Temp: 97.6 F (36.4 C) 98 F (36.7 C)  SpO2: 99% 98%  EYES: anicteric, no injection, no discharge. ENMT: No thrush. No cervical lymphadenopathy appreciated. Cardiovascular: RRR, no MGR appreciated. Respiratory: CTA Bl, normal WOB on RA GI: Soft, nontender and  not distended. +bs Skin: wet erythematous rash of inguinal folds. RLE with wound on shin with black eschar. Pustular rash in various stages of healing BL forearms, lower trunk, thighs. Tender to palpation.  Lab Results  Component Value Date   WBC 3.8 (L) 01/30/2017   HGB 10.8 (L) 01/30/2017   HCT 32.5 (L) 01/30/2017   MCV 78.3 01/30/2017   PLT 145 (L)  01/30/2017    Lab Results  Component Value Date   CREATININE 1.11 01/30/2017   BUN 9 01/30/2017   NA 139 01/30/2017   K 3.1 (L) 01/30/2017   CL 114 (H) 01/30/2017   CO2 20 (L) 01/30/2017    Lab Results  Component Value Date   ALT 17 01/29/2017   AST 22 01/29/2017   ALKPHOS 73 01/29/2017    Microbiology: No results found for this or any previous visit (from the past 240 hour(s)).  Noemi ChapelBethany Sharalee Witman, DO [facility] [email or web address] [phone] pager  [phone] 01/30/2017, 10:15 AM

## 2017-01-30 NOTE — Progress Notes (Signed)
   Subjective: Patient doing well this AM. States that his sores initially started in the pubic region and have since spread. The largest on being on the anterior shin of the left leg, which he preformed I&D. He states that the areas typically start as vesicles then progress to pimples. When he pops them they spread to surrounding areas. He endorses fever and chills. He has never had similar episodes aside from a MRSA bone infection several years prior. He has not received treatment for his HIV in approximately 6 months and is unsure of the medications he was on. Discussed consulting infectious disease. He agrees with the plan. All questions and concerns addressed.   Objective: Vital signs in last 24 hours: Vitals:   01/30/17 0100 01/30/17 0115 01/30/17 0205 01/30/17 0521  BP: 119/84 131/83 114/66 103/65  Pulse: (!) 52 (!) 47 (!) 58 (!) 56  Resp:   18 18  Temp:   97.6 F (36.4 C) 98 F (36.7 C)  TempSrc:   Oral Oral  SpO2: 100% 100% 99% 98%  Weight:      Height:   6' 1.2" (1.859 m)    General: Well nourished male in no acute distress Pulm: Good air movement with no wheezing or crackles CV: RRR, no murmurs, no rubs  Abdomen: Active bowel sounds, soft, no tenderness to palpation  GU: Multiple erythematous pustules/papules in the pubic region in various stages of healing  Extremities: Multiple pustules and scabs in various stages on healing on the upper extremities bilaterally. Healing lesion on the left anterior shin    Assessment/Plan:  1. Diffuse soft tissue infections  - DDx Infectious Folliculitis vs Furuncle vs Carbuncle vs HSV vs VZV  - Recurrent abscesses unlike to be secondary to a primary immunodeficiency but more likely a secondary immunodeficiency with this patient having untreated HIV with most recent CD < 200 - Will follow-up blood cultures  - HSV and VZV PCR sent  - Currently being treated with Doxycycline  - Have consulted infectious disease  2. HIV - Off medications  for over 2 years  - Most recent viral loan on 04/11/2016 was 143k, with his most recent CD4 count in 9/18 was 136  - CD4 and viral load reordered on admission, will follow-up  - Have consulted ID  3. Microcytic Anemia  - Hgb 10.8 with high RDW  - Likely iron deficiency  - Will check iron studies   4. Pancytopenia  - No pancytopenic on admission  - Will cotninue to monitor   5. Electrolyte Derangements  - Hypokalemia replacing with PO K-Dur  Dispo: Anticipated discharge in approximately >1 day(s).   Levora DredgeHelberg, Agastya Meister, MD 01/30/2017, 5:42 AM My Pager: [phone]

## 2017-01-30 NOTE — Progress Notes (Signed)
Pt refusing lovenox shots, educated on the use of the injection, pt verbalized understanding and refuses.

## 2017-01-30 NOTE — Plan of Care (Signed)
Patient progressing 

## 2017-01-30 NOTE — Progress Notes (Signed)
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 The lesion was removed in the usual manner by the biopsy method noted above. Hemostasis was assured. The patient tolerated the procedure well.   Closure: None, compression  Followup: The patient tolerated the procedure well without complications. Standard post-procedure care is explained and return precautions are given.

## 2017-01-31 LAB — CBC
HEMATOCRIT: 34.8 % — AB (ref 39.0–52.0)
HEMOGLOBIN: 11.6 g/dL — AB (ref 13.0–17.0)
MCH: 26.5 pg (ref 26.0–34.0)
MCHC: 33.3 g/dL (ref 30.0–36.0)
MCV: 79.6 fL (ref 78.0–100.0)
Platelets: 159 10*3/uL (ref 150–400)
RBC: 4.37 MIL/uL (ref 4.22–5.81)
RDW: 17.2 % — ABNORMAL HIGH (ref 11.5–15.5)
WBC: 5.2 10*3/uL (ref 4.0–10.5)

## 2017-01-31 LAB — BASIC METABOLIC PANEL
Anion gap: 5 (ref 5–15)
BUN: 9 mg/dL (ref 6–20)
CHLORIDE: 114 mmol/L — AB (ref 101–111)
CO2: 20 mmol/L — AB (ref 22–32)
Calcium: 8.5 mg/dL — ABNORMAL LOW (ref 8.9–10.3)
Creatinine, Ser: 0.99 mg/dL (ref 0.61–1.24)
GFR calc Af Amer: >60 mL/min (ref >60)
GLUCOSE: 90 mg/dL (ref 65–99)
POTASSIUM: 3.8 mmol/L (ref 3.5–5.1)
SODIUM: 139 mmol/L (ref 135–145)

## 2017-01-31 LAB — MRSA PCR SCREENING: MRSA by PCR: POSITIVE — AB

## 2017-01-31 LAB — IRON AND TIBC
IRON: 28 ug/dL — AB (ref 45–182)
SATURATION RATIOS: 10 % — AB (ref 17.9–39.5)
TIBC: 274 ug/dL (ref 250–450)
UIBC: 246 ug/dL

## 2017-01-31 LAB — FERRITIN: FERRITIN: 17 ng/mL — AB (ref 24–336)

## 2017-01-31 LAB — GLUCOSE, CAPILLARY: Glucose-Capillary: 98 mg/dL (ref 65–99)

## 2017-01-31 LAB — HIV-1 RNA QUANT-NO REFLEX-BLD
HIV 1 RNA Quant: 111000 copies/mL
LOG10 HIV-1 RNA: 5.045 log10copy/mL

## 2017-01-31 MED ORDER — ATOVAQUONE 750 MG/5ML PO SUSP: 1500.0000 mg | Freq: Every day | ORAL | 0 refills | Status: DC

## 2017-01-31 MED ORDER — ALUM & MAG HYDROXIDE-SIMETH 200-200-20 MG/5ML PO SUSP
30.0000 mL | ORAL | Status: DC | PRN
  Administered 2017-01-31 – 2017-02-01 (×2): 30 mL via ORAL
  Filled 2017-01-31 (×3): qty 30

## 2017-01-31 MED ORDER — FLUCONAZOLE 200 MG PO TABS: 200.0000 mg | ORAL_TABLET | Freq: Every day | ORAL | 0 refills | Status: DC

## 2017-01-31 MED ORDER — EMTRICITABINE-TENOFOVIR AF 200-25 MG PO TABS: 1.0000 | ORAL_TABLET | Freq: Every day | ORAL | 0 refills | Status: DC

## 2017-01-31 MED ORDER — INFLUENZA VAC SPLIT QUAD 0.5 ML IM SUSY
0.5000 mL | PREFILLED_SYRINGE | INTRAMUSCULAR | Status: AC
Start: 2017-02-01 — End: 2017-01-31
  Administered 2017-01-31: 0.5 mL via INTRAMUSCULAR
  Filled 2017-01-31: qty 0.5

## 2017-01-31 MED ORDER — DARUNAVIR-COBICISTAT 800-150 MG PO TABS: 1.0000 | ORAL_TABLET | Freq: Every day | ORAL | 0 refills | Status: DC

## 2017-01-31 MED ORDER — DOXYCYCLINE HYCLATE 100 MG PO TABS: 100.0000 mg | ORAL_TABLET | Freq: Two times a day (BID) | ORAL | 0 refills | Status: DC

## 2017-01-31 MED ORDER — MUPIROCIN 2 % EX OINT
1.0000 "application " | TOPICAL_OINTMENT | Freq: Two times a day (BID) | CUTANEOUS | Status: DC
  Administered 2017-01-31 – 2017-02-01 (×3): 1 via NASAL
  Filled 2017-01-31 (×2): qty 22

## 2017-01-31 MED ORDER — CHLORHEXIDINE GLUCONATE CLOTH 2 % EX PADS
6.0000 | MEDICATED_PAD | Freq: Every day | CUTANEOUS | Status: DC
  Administered 2017-01-31 – 2017-02-01 (×2): 6 via TOPICAL

## 2017-01-31 NOTE — Discharge Summary (Signed)
Name: [name] MRN: [id number] DOB: 10/06/1965 51 y.o. PCP: Patient, No Pcp Per  Date of Admission: 01/29/2017 10:18 PM Date of Discharge: 02/01/2017 Attending Physician: Tyson AliasVincent, Duncan Thomas, *  Discharge Diagnosis: 1. Folliculitis  2. HIV  Discharge Medications: Allergies as of 02/01/2017      Reactions   Sulfa Antibiotics Rash   Tramadol Nausea Only      Medication List    TAKE these medications   atovaquone 750 MG/5ML suspension Commonly known as:  MEPRON Take 10 mLs (1,500 mg total) daily by mouth.   darunavir-cobicistat 800-150 MG tablet Commonly known as:  PREZCOBIX Take 1 tablet daily by mouth. Swallow whole. Do NOT crush, break or chew tablets. Take with food.   doxycycline 100 MG tablet Commonly known as:  VIBRA-TABS Take 1 tablet (100 mg total) every 12 (twelve) hours by mouth.   emtricitabine-tenofovir AF 200-25 MG tablet Commonly known as:  DESCOVY Take 1 tablet daily by mouth.   fluconazole 200 MG tablet Commonly known as:  DIFLUCAN Take 1 tablet (200 mg total) daily by mouth.   ibuprofen 200 MG tablet Commonly known as:  ADVIL,MOTRIN Take 800 mg every 4 (four) hours as needed by mouth for mild pain.       Disposition and follow-up:   [name].  At the hospital follow up visit please address:  1.  Folliculitis: Discharged with a prescription for doxycycline and diflucan to complete a 5 day course. Punch biopsy, VZV, and HSV PCR pending on discharge. Please follow up.   2. HIV: CD4 count 150 this hospitalization, viral load 111,000. Restarted on antiretrovirals. Discharged on Prezcobix and Descovy and atovaquone for PCP prophylaxis. Please ensure patient has ID follow up scheduled.   3.  Labs / imaging needed at time of follow-up: None  4.  Pending labs/ test needing follow-up: 01/30/17 punch biopsy path results, HSV and VZV PCR, blood cultures, and  antifungal antibodies. HIV viral load.   Follow-up Appointments: Follow-up Information    [facility]. Go on 02/16/2017.   Specialty:  Internal Medicine Why:  Post hospital follow up scheduled for 02/16/2017 at 1pm with Julianne HandlerLaChina Hollis NP Contact information: [address] [place] [id number] [phone]          Hospital Course by problem list:  1. Folliculitis: 51 yo M with a pmhx of HIV off antiretroviral therapy x 2 years who presented with painful bumps on his skin for the past 4 days. He reported that the lesions are on his bilateral legs, backside, abdomen, groin, arms, and his face.  He confirms that he had chickenpox when he was a young child. He says that they do start out as little bumps, sometimes vesicles.  He frequently picks at them and tries to drain them on his own. On arrival to the ED he was afebrile and hemodynamically stable. CBC was unremarkable aside from chronic normocytic anemia at his baseline (hgb 11.9). BMP was unremarkable aside from a mild AKI (creatinine 1.27) and hypokalemia which both resolved the following day. On exam he has multiple pustules and scabs in various healing stages with excoriations (see pictures below). A punch biopsy of one of the lesions was performed. Path report was pending on discharge.  Test for HSV and VZV PCR was also sent and pending on discharge. 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  He was started on doxycycline 100 mg BID empirically and Diflucan for fungal coverage. He was discharged with a prescriptions to complete a 5 day course.      2. HIV: Patient has a history of HIV since the 1990s and has intermittently been on treatment in the past.  He reports discontinuing retrovirals about 6 months ago because he lost follow-up with his provider. CD4 count this hospitalization was 150, viral load 111,000. ID was consulted and he was started on  Prezcobix and Descovy daily. He was given a prescription for a 30 day supply and instructed to follow up with ID for an outpatient appointment next week. He was also started on atovaquone for PCP prophylaxis.    3. Chronic Normocytic Anemia: Labs consistent with iron deficiency. Will need iron supplements once he has completed his doxycycline course (interaction per pharmacy).   Discharge Vitals:   BP 122/82 (BP Location: Left Arm)   Pulse 70   Temp 97.6 F (36.4 C) (Oral)   Resp 16   Ht 6' 1.2" (1.859 m)   Wt 185 lb 6.5 oz (84.1 kg)   SpO2 99%   BMI 24.33 kg/m   Pertinent Labs, Studies, and Procedures:    Ref. Range 01/31/2017   Iron Latest Ref Range: 45 - 182 ug/dL 28 (L)  UIBC Latest Units: ug/dL 962246  TIBC Latest Ref Range: 250 - 450 ug/dL 952274  Saturation Ratios Latest Ref Range: 17.9 - 39.5 % 10 (L)  Ferritin Latest Ref Range: 24 - 336 ng/mL 17 (L)   01/30/2017 Blood Culture x 2 >> Pending   01/30/2017 Punch Biopsy >> Derm Path Pending   01/30/2017 HSV PCR >> Pending   01/30/2017 VZV PCR >> Pending   01/30/2017 Fungal Antibodies (blood) >> Pending   Discharge Instructions: Discharge Instructions    Call MD for:  redness, tenderness, or signs of infection (pain, swelling, redness, odor or green/yellow discharge around incision site)   Complete by:  As directed    Call MD for:  temperature >100.4   Complete by:  As directed    Diet - low sodium heart healthy   Complete by:  As directed    Discharge instructions   Complete by:  As directed    [name],  It was a pleasure taking care of you. I am glad you are feeling better. Please continue to take your doxycycline and diflucan as prescribed for the next 3 days to complete your treatment course. It is very important that you take your HIV medicines everyday. I have sent prescriptions to your pharmacy. Please follow up with a primary care provider in the next 2 weeks. We will call you with the results of your biopsy.  Thank you!  - Dr. Antony ContrasGuilloud   Increase activity slowly   Complete by:  As directed       Signed: Reymundo Haas, Joseph Swaim, MD 02/01/2017, 10:06 AM   Pager: [phone]

## 2017-01-31 NOTE — Plan of Care (Signed)
Patient progressing 

## 2017-01-31 NOTE — Progress Notes (Addendum)
Talked to patient about not wanting to go home; He has lots of questions concerning his care of treatment and want answers concerning what is going on with his health. CM called Attending MD, she was placed on speaker phone in the patient's room. Patient is still very disgruntle and is refusing to leave. CM gave patient paperwork concerning his Medicare rights and his right to appeal his discharge.  PCP Dr Albertina Senegaloty-  But he has left the practice. He sent him a letter to follow up with the Physician that will be seeing his patient but he has not decided if he wants to see him. Has private insurance with Medicare / Medicaid with prescription drug coverage; [facility]. Patient stated that he cannot pay for his medication, CM informed patient that he has prescription drug coverage and that CM cannot assist him with any of his medication. Abelino DerrickB Essynce Munsch [facility] HospitalRN,MHA,[name] [phone]  3:27 pm- CM talked to patient again, he stated that he filed an appeal / appealing his discharge; awaiting on Keppro QIO to contact CM department with an appeal number to initiate the process. Abelino DerrickB Manuela Halbur RN,MHA,[name]

## 2017-01-31 NOTE — Progress Notes (Signed)
   Subjective: Doing well this morning. Concerned about skin lesion diagnosis. Informed patient biopsy results are still pending. No improvement with doxy thus par per patient. Remains afebrile.   Objective:  Vital signs in last 24 hours: Vitals:   01/30/17 0205 01/30/17 0521 01/30/17 1510 01/31/17 0621  BP: 114/66 103/65 127/88 127/73  Pulse: (!) 58 (!) 56 66 (!) 52  Resp: 18 18 17 16   Temp: 97.6 F (36.4 C) 98 F (36.7 C) (!) 97.4 F (36.3 C) (!) 97.5 F (36.4 C)  TempSrc: Oral Oral Oral Oral  SpO2: 99% 98% 98% 99%  Weight: 185 lb 6.5 oz (84.1 kg)     Height: 6' 1.2" (1.859 m)      General: Well nourished male in no acute distress Pulm: Good air movement with no wheezing or crackles CV: RRR, no murmurs, no rubs  Abdomen: Active bowel sounds, soft, no tenderness to palpation  Extremities: Multiple pustules and scabs in various stages on healing on the upper extremities bilaterally, chest, and abdomen. Healing lesion on the left anterior shin        Assessment/Plan:  [name] is a 51 yo M with a pmhx of HIV not on antiretrovirals who presented on 11/15 with complaint of diffuse painful skin lesions x 4 days.   1. Diffuse Skin Lesions: With excoriations in various healing stages with scabs. S/p punch biopsy yesterday. Sent for Varicella-zoster PCR and HSV PCR pending. DDx includes infectious folliculitis vs eosinophilic folliculitis vs. furuncle vs carbuncle vs HSV vs VZV infection.  - F/u Blood cx >> pending   - F/u punch biopsy >> path pending - F/u HSV and VZV PCR   - Continue Doxycycline (Day 2/5)  - ID following, appreciate input   2. HIV: Off antiretrovirals x 2 years. CD4 count 180 this admission. Viral load pending.  - ID following, appreciate recs - On Atovaquone now for PCP ppx  - Continue Prezcobix & Descovy per ID   3. Microcytic Anemia: Stable at baseline. Iron low at 28, ferritin low at 17. Likely iron deficiency. Will start iron supplements on  discharge.   4. Hx of Pancytopenia  - Not pancytopenic on admission  - Will cotninue to monitor   5. Electrolyte Derangements  - resolved with potassium repletion yesterday   Dispo: Anticipated discharge today.   [name], Joseph Vantol, MD 01/31/2017, 11:22 AM Pager: [phone]

## 2017-01-31 NOTE — Progress Notes (Signed)
Patient don't want to get discharge from  the hospital,He Stated " I feel like same when I get admitted to hospital. Doctor is aware of this and charge nurse spoke to him about the discharge. Patient still has conflict about his discharge.Charge nurse spoke to care manager regarding his willingness to stay at hospital and don't want to get discharge.

## 2017-01-31 NOTE — Progress Notes (Signed)
Pt complaining of RLQ abd pain after eating his supper today. Explained to this RN that he was experiencing some pain after eating lunch, however it wasn't that bad. MD notified. No new orders obtained. Pt was cleared to be D/C'd home today, however he is appealing his D/C. Will ctm.

## 2017-02-01 LAB — GLUCOSE, CAPILLARY: GLUCOSE-CAPILLARY: 76 mg/dL (ref 65–99)

## 2017-02-01 NOTE — Progress Notes (Signed)
Patient is like to go home today.Updated info to care manager [name]( [name] ) by Page.

## 2017-02-01 NOTE — Care Management Note (Signed)
Case Management Note  Patient Details  Name: [name] MRN: [id number] Date of Birth: 10/02/1965  Subjective/Objective:  No further CM needs                  Action/Plan:CM will sign off for now but will be available should additional discharge needs arise or disposition change.    Expected Discharge Date:  02/01/17               Expected Discharge Plan:  Home/Self Care  In-House Referral:     Discharge planning Services  CM Consult  Post Acute Care Choice:    Choice offered to:     DME Arranged:    DME Agency:     HH Arranged:    HH Agency:     Status of Service:  Completed, signed off  If discussed at [employer] Length of Stay Meetings, dates discussed:    Additional Comments:  Yvone NeuCrutchfield, Tamyrah Burbage M, RN 02/01/2017, 11:48 AM

## 2017-02-01 NOTE — Progress Notes (Signed)
NURSING PROGRESS NOTE  Lesle ReekLarry D Ang [id number] Discharge Data: 02/01/2017 12:49 PM Attending Provider: Tyson AliasVincent, Duncan Thomas, * EXB:[id number]:Patient, No Pcp Per   Lesle ReekLarry D Maffett to be D/C'd Home per MD order.    All IV's will be discontinued and monitored for bleeding.  All belongings will be returned to patient for patient to take home.  Last Documented Vital Signs:  Blood pressure 122/82, pulse 70, temperature 97.6 F (36.4 C), temperature source Oral, resp. rate 16, height 6' 1.2" (1.859 m), weight 84.1 kg (185 lb 6.5 oz), SpO2 99 %.     Monico BlitzJameela RN

## 2017-02-02 LAB — HSV CULTURE AND TYPING

## 2017-02-04 LAB — CULTURE, BLOOD (ROUTINE X 2)
Culture: NO GROWTH
Culture: NO GROWTH
SPECIAL REQUESTS: ADEQUATE
Special Requests: ADEQUATE

## 2017-02-06 LAB — FUNGAL ANTIBODIES PANEL, ID-BLOOD
ASPERGILLUS FLAVUS: NEGATIVE
ASPERGILLUS FUMIGATUS IGG: NEGATIVE
ASPERGILLUS NIGER: NEGATIVE
BLASTOMYCES ABS, QN, DID: NEGATIVE
HISTOPLASMA AB ID: NEGATIVE

## 2017-02-12 ENCOUNTER — Inpatient Hospital Stay: Payer: Medicare Other | Admitting: Internal Medicine

## 2017-02-16 ENCOUNTER — Ambulatory Visit: Payer: Medicare Other | Admitting: Family Medicine

## 2017-04-13 ENCOUNTER — Ambulatory Visit (INDEPENDENT_AMBULATORY_CARE_PROVIDER_SITE_OTHER): Payer: Medicare Other | Admitting: Orthopaedic Surgery

## 2017-04-13 ENCOUNTER — Encounter (INDEPENDENT_AMBULATORY_CARE_PROVIDER_SITE_OTHER): Payer: Self-pay | Admitting: Orthopaedic Surgery

## 2017-04-13 ENCOUNTER — Ambulatory Visit (INDEPENDENT_AMBULATORY_CARE_PROVIDER_SITE_OTHER): Payer: Medicare Other

## 2017-04-13 ENCOUNTER — Telehealth (INDEPENDENT_AMBULATORY_CARE_PROVIDER_SITE_OTHER): Payer: Self-pay | Admitting: Orthopaedic Surgery

## 2017-04-13 DIAGNOSIS — B20 Human immunodeficiency virus [HIV] disease: Secondary | ICD-10-CM | POA: Diagnosis not present

## 2017-04-13 DIAGNOSIS — M1611 Unilateral primary osteoarthritis, right hip: Secondary | ICD-10-CM | POA: Diagnosis not present

## 2017-04-13 MED ORDER — GABAPENTIN 100 MG PO CAPS
100.0000 mg | ORAL_CAPSULE | Freq: Three times a day (TID) | ORAL | 3 refills | Status: DC
Start: 2017-04-13 — End: 2017-05-04

## 2017-04-13 MED ORDER — MELOXICAM 7.5 MG PO TABS: 7.5000 mg | ORAL_TABLET | Freq: Two times a day (BID) | ORAL | 2 refills | Status: DC | PRN

## 2017-04-13 NOTE — Progress Notes (Signed)
Office Visit Note   Patient: [name]           Date of Birth: 06/11/1965           MRN: [id number] Visit Date: 04/13/2017              Requested by: No referring provider defined for this encounter. PCP: Patient, No Pcp Per   Assessment & Plan: Visit Diagnoses:  1. Primary osteoarthritis of right hip     Plan: Impression is degenerative joint disease right hip and avascular necrosis.  I spent a long time talking to about the importance of being compliant with his HIV medicines.  There is no reason why he should not take them.  His insurance pays for them and he has no problems with access to the medicines.  We are going to get lab work to look at his CD4 count and viral load before we can determine if he is appropriate for surgery or not.  We also discussed postoperative pain management with narcotic pain medicines for up to 6 weeks.  Prescription for gabapentin and meloxicam today.  Questions encouraged and answered.  We discussed the risk benefits alternatives to surgery including infection, leg length discrepancy, dislocation, incomplete relief of pain. Total face to face encounter time was greater than 45 minutes and over half of this time was spent in counseling and/or coordination of care.  Follow-Up Instructions: Return if symptoms worsen or fail to improve.   Orders:  Orders Placed This Encounter  Procedures  . XR HIP UNILAT W OR W/O PELVIS 2-3 VIEWS RIGHT  . HIV 1 RNA quant-no reflex-bld  . T-helper cells (CD4) count (not at [facility])   Meds ordered this encounter  Medications  . gabapentin (NEURONTIN) 100 MG capsule    Sig: Take 1 capsule (100 mg total) by mouth 3 (three) times daily.    Dispense:  30 capsule    Refill:  3  . meloxicam (MOBIC) 7.5 MG tablet    Sig: Take 1 tablet (7.5 mg total) by mouth 2 (two) times daily as needed for pain.    Dispense:  30 tablet    Refill:  2      Procedures: No procedures performed   Clinical Data: No additional  findings.   Subjective: Chief Complaint  Patient presents with  . Right Hip - Pain    Patient is a 52 year old gentleman who comes in for evaluation of his right hip degenerative joint disease.  This is been progressively worse over the last several years.  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 Barrie LymeFehring.  He is continued to do well from this.  He does admit to being noncompliant with his HIV medicines.  He has been back on these medicines for about a month now because he knows that he needs to have a hip replacement    Review of Systems  Constitutional: Negative.   All other systems reviewed and are negative.    Objective: Vital Signs: There were no vitals taken for this visit.  Physical Exam  Constitutional: He is oriented to person, place, and time. He appears well-developed and well-nourished.  HENT:  Head: Normocephalic and atraumatic.  Eyes: Pupils are equal, round, and reactive to light.  Neck: Neck supple.  Pulmonary/Chest: Effort normal.  Abdominal: Soft.  Musculoskeletal: Normal range of motion.  Neurological: He is alert and oriented to person, place,  and time.  Skin: Skin is warm.  Psychiatric: He has a normal mood and affect. His behavior is normal. Judgment and thought content normal.  Nursing note and vitals reviewed.   Ortho Exam Right hip exam shows very limited range of motion is significant pain with any rotation.  Positive Stinchfield sign. Specialty Comments:  No specialty comments available.  Imaging: Xr Hip Unilat W Or W/o Pelvis 2-3 Views Right  Result Date: 04/13/2017 Stable left total hip replacement.  Advanced degenerative joint disease of right hip with collapse of femoral head    PMFS History: Patient Active Problem List   Diagnosis Date Noted  . Infectious folliculitis 01/30/2017  . Folliculitis 01/30/2017   Past Medical History:    Diagnosis Date  . HIV (human immunodeficiency virus infection) (HCC)   . MRSA (methicillin resistant staph aureus) culture positive     History reviewed. No pertinent family history.  Past Surgical History:  Procedure Laterality Date  . hip arthroplasty Left 2016   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, I&D with replacement in 2017   Social History   Occupational History  . Not on file  Tobacco Use  . Smoking status: Never Smoker  Substance and Sexual Activity  . Alcohol use: No    Frequency: Never  . Drug use: No  . Sexual activity: Not on file

## 2017-04-13 NOTE — Telephone Encounter (Signed)
Please call pt to discuss x-ray he was seen in the office today. I did discuss with pt about filling out medical release form. Pt stated he will need  a ride to get back to office. Pt is requesting x-rays of today's visit. Not sure how to help pt due to his limited access to transportation.

## 2017-04-13 NOTE — Telephone Encounter (Signed)
Contacted and informed patient we are unable to e-mail images.  Patient stated he will pick up CD of his right hip tomorrow at the front desk.

## 2017-04-14 ENCOUNTER — Telehealth (INDEPENDENT_AMBULATORY_CARE_PROVIDER_SITE_OTHER): Payer: Self-pay | Admitting: Orthopaedic Surgery

## 2017-04-14 ENCOUNTER — Encounter: Payer: Self-pay | Admitting: Pediatric Intensive Care

## 2017-04-14 NOTE — Telephone Encounter (Signed)
I called patient at number provided in chart. His mother is not happy, she does not know how to contact patient. Advised that letter at front desk. She advised patient needs to update his number. Advised ok.

## 2017-04-14 NOTE — Telephone Encounter (Signed)
Patient called wanting a letter of limitations from [employer].  Please call patient to advise

## 2017-04-14 NOTE — Telephone Encounter (Signed)
No prolonged standing or activity.  Basically sedentary work for now

## 2017-04-14 NOTE — Telephone Encounter (Signed)
Please advise 

## 2017-04-15 ENCOUNTER — Ambulatory Visit: Payer: Self-pay | Admitting: *Deleted

## 2017-04-15 ENCOUNTER — Ambulatory Visit (INDEPENDENT_AMBULATORY_CARE_PROVIDER_SITE_OTHER): Payer: Medicare Other | Admitting: Pharmacist Clinician (PhC)/ Clinical Pharmacy Specialist

## 2017-04-15 DIAGNOSIS — B20 Human immunodeficiency virus [HIV] disease: Secondary | ICD-10-CM

## 2017-04-15 LAB — HIV-1 RNA QUANT-NO REFLEX-BLD
HIV 1 RNA Quant: 930 copies/mL — ABNORMAL HIGH
HIV-1 RNA Quant, Log: 2.97 Log copies/mL — ABNORMAL HIGH

## 2017-04-15 MED ORDER — DARUNAVIR-COBICISTAT 800-150 MG PO TABS: 1.0000 | ORAL_TABLET | Freq: Every day | ORAL | 0 refills | Status: DC

## 2017-04-15 MED ORDER — EMTRICITABINE-TENOFOVIR AF 200-25 MG PO TABS: 1.0000 | ORAL_TABLET | Freq: Every day | ORAL | 0 refills | Status: DC

## 2017-04-15 NOTE — Progress Notes (Signed)
HPI: [name] is a 52 y.o. male who is here to see pharmacy for his hx of HIV.   Allergies: Allergies  Allergen Reactions  . Sulfa Antibiotics Rash  . Tramadol Nausea Only    Vitals:    Past Medical History: Past Medical History:  Diagnosis Date  . HIV (human immunodeficiency virus infection) (HCC)   . MRSA (methicillin resistant staph aureus) culture positive     Social History: Social History   Socioeconomic History  . Marital status: Legally Separated    Spouse name: Not on file  . Number of children: Not on file  . Years of education: Not on file  . Highest education level: Not on file  Social Needs  . Financial resource strain: Not on file  . Food insecurity - worry: Not on file  . Food insecurity - inability: Not on file  . Transportation needs - medical: Not on file  . Transportation needs - non-medical: Not on file  Occupational History  . Not on file  Tobacco Use  . Smoking status: Never Smoker  Substance and Sexual Activity  . Alcohol use: No    Frequency: Never  . Drug use: No  . Sexual activity: Not on file  Other Topics Concern  . Not on file  Social History Narrative  . Not on file    Previous Regimen: TRV, Prescobix, atazantavir/ritonavir, Triumeq, various others  Current Regimen: Prezcobix/Descovy  Labs: HIV 1 RNA Quant (copies/mL)  Date Value  04/13/2017 930 (H)  01/29/2017 111,000   CD4 T Cell Abs (/uL)  Date Value  01/29/2017 150 (L)    CrCl: CrCl cannot be calculated (Patient's most recent lab result is older than the maximum 21 days allowed.).  Lipids: No results found for: CHOL, TRIG, HDL, CHOLHDL, VLDL, LDLCALC  Assessment: [name] is a pt that was dx with HIV since 1995. He said that he has been various ART since then including regimen that has around 15 pills. He can't really remember which one even with the chart. He was previously seen by [facility] in [place]. He is here now and is staying at the [name]  until his housing is established. Joseph Poseymbre is involved with his care and would like to pharmacy to see him about his HIV meds today.  [facility]. 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 He has a very poor his of adherence to ART. He admitted to being off of ART for a while before the admission to the hospital. He was restarted on Prezcobix and Descovy at that time. We really don't have good idea of the resistance that he may have harbored since he has had HIV for a long time. At the ID visit with [facility] in 2/18, he empirically placed Tivicay/prescobix/Descovy pending a genotype. The genotype came back pan sensitive. This is not surprising since he was off of therapy for a while. It's probably picked wild type virus only. He stated that he has been on consistent therapy since then. He admitted to being more serious about taking his meds because of his stage in life now.   He still have lots of pain in his hip. He recently saw Dr[facility] Roda Ha[facility] at [facility]. His most recent VL on 1/28 came back at 930 from 111k and his CD4 is 150 which he is taking Mepron for. Since he was an addon today, I didn't see the VL  was drawn on the 28th. Got him to see [name] in 2 wks. Send him 1 more month of ART. We were hoping that we could be able to get a reflex back to genotype but it looks like we can't. Ortho would like to see his HIV under control before proceeding with the procedure. Tried to schedule him with one of the MDs but all of the slots were booked.   Recommendations:  Continue Prezcobix and Descovy  HIV VL with reflex, hep A, B, bmet F/u with [name] in 2 wks Will need more refills at the visit   [name], PharmD, BCPS, AAHIVP, CPP Clinical Infectious Disease Pharmacist [facility] 04/15/2017, 3:58 PM

## 2017-04-16 ENCOUNTER — Telehealth (INDEPENDENT_AMBULATORY_CARE_PROVIDER_SITE_OTHER): Payer: Self-pay | Admitting: Orthopaedic Surgery

## 2017-04-16 LAB — T-HELPER CELL (CD4) - (RCID CLINIC ONLY)
CD4 % Helper T Cell: 11 % — ABNORMAL LOW (ref 33–55)
CD4 T Cell Abs: 170 /uL — ABNORMAL LOW (ref 400–2700)

## 2017-04-16 LAB — COMPLETE METABOLIC PANEL WITH GFR
AG Ratio: 1.2 (calc) (ref 1.0–2.5)
ALBUMIN MSPROF: 3.6 g/dL (ref 3.6–5.1)
ALT: 21 U/L (ref 9–46)
AST: 22 U/L (ref 10–40)
Alkaline phosphatase (APISO): 70 U/L (ref 40–115)
BUN: 12 mg/dL (ref 7–25)
CO2: 21 mmol/L (ref 20–32)
CREATININE: 0.99 mg/dL (ref 0.60–1.35)
Calcium: 8.6 mg/dL (ref 8.6–10.3)
Chloride: 109 mmol/L (ref 98–110)
GFR, EST AFRICAN AMERICAN: 103 mL/min/{1.73_m2} (ref >=60)
GFR, EST NON AFRICAN AMERICAN: 89 mL/min/{1.73_m2} (ref >=60)
GLOBULIN: 3 g/dL (ref 1.9–3.7)
GLUCOSE: 89 mg/dL (ref 65–99)
Potassium: 4.1 mmol/L (ref 3.5–5.3)
Sodium: 140 mmol/L (ref 135–146)
TOTAL PROTEIN: 6.6 g/dL (ref 6.1–8.1)
Total Bilirubin: 0.3 mg/dL (ref 0.2–1.2)

## 2017-04-16 LAB — CBC
HEMATOCRIT: 33.5 % — AB (ref 38.5–50.0)
Hemoglobin: 11.3 g/dL — ABNORMAL LOW (ref 13.2–17.1)
MCH: 27.2 pg (ref 27.0–33.0)
MCHC: 33.7 g/dL (ref 32.0–36.0)
MCV: 80.7 fL (ref 80.0–100.0)
MPV: 10.1 fL (ref 7.5–12.5)
Platelets: 245 10*3/uL (ref 140–400)
RBC: 4.15 10*6/uL — ABNORMAL LOW (ref 4.20–5.80)
RDW: 16.3 % — ABNORMAL HIGH (ref 11.0–15.0)
WBC: 8 10*3/uL (ref 3.8–10.8)

## 2017-04-16 LAB — HEPATITIS C AB W/RFL RNA, PCR + GENO
Hepatitis C Ab: NONREACTIVE
SIGNAL TO CUT-OFF: 0.14 (ref <1.00)

## 2017-04-16 LAB — HEPATITIS A ANTIBODY, TOTAL: Hepatitis A AB,Total: NONREACTIVE

## 2017-04-16 NOTE — Congregational Nurse Program (Signed)
Congregational Nurse Program Note  Date of Encounter: 04/13/2017  Past Medical History: Past Medical History:  Diagnosis Date  . HIV (human immunodeficiency virus infection) (HCC)   . MRSA (methicillin resistant staph aureus) culture positive     Encounter Details: CNP Questionnaire - 04/13/17 1818      Questionnaire   Patient Status  Not Applicable    Race  White or Caucasian    Location Patient Served At  Not Applicable    Insurance  Medicaid    Uninsured  Not Applicable    Food  Yes, have food insecurities;Within past 12 months, worried food would run out with no money to buy more;Within past 12 months, food ran out with no money to buy more    Housing/Utilities  No permanent housing    Transportation  Yes, need transportation assistance    Interpersonal Safety  No, do not feel physically and emotionally safe where you currently live    Medication  No medication insecurities    Medical Provider  Yes    Referrals  Not Applicable    ED Visit Averted  Not Applicable    Life-Saving Intervention Made  Not Applicable      Requested assistance with obtaining records from recent doctor visit.  Assisted client with contacting [facility] for the information he was requesting

## 2017-04-16 NOTE — Progress Notes (Signed)
Please let him know that we can do the surgery once his HIV viral count is nondetectable.  It's currently too high

## 2017-04-16 NOTE — Telephone Encounter (Signed)
Patient called asking for the results of his CD4? [phone] # [phone]

## 2017-04-22 DIAGNOSIS — Z8619 Personal history of other infectious and parasitic diseases: Secondary | ICD-10-CM

## 2017-04-22 DIAGNOSIS — J449 Chronic obstructive pulmonary disease, unspecified: Secondary | ICD-10-CM | POA: Insufficient documentation

## 2017-04-22 DIAGNOSIS — J45909 Unspecified asthma, uncomplicated: Secondary | ICD-10-CM | POA: Insufficient documentation

## 2017-04-22 HISTORY — DX: Personal history of other infectious and parasitic diseases: Z86.19

## 2017-04-28 ENCOUNTER — Encounter: Payer: Self-pay | Admitting: *Deleted

## 2017-04-28 ENCOUNTER — Ambulatory Visit: Payer: Medicare Other | Admitting: Infectious Diseases

## 2017-04-28 ENCOUNTER — Encounter: Payer: Self-pay | Admitting: Pediatric Intensive Care

## 2017-05-02 NOTE — Congregational Nurse Program (Signed)
Congregational Nurse Program Note  Date of Encounter: 04/14/2017  Past Medical History: Past Medical History:  Diagnosis Date  . HIV (human immunodeficiency virus infection) (HCC)   . MRSA (methicillin resistant staph aureus) culture positive     Encounter Details: CNP Questionnaire - 04/14/17 0900      Questionnaire   Patient Status  Not Applicable    Race  White or Caucasian    Location Patient Served At  [facility]    Insurance  Medicaid    Uninsured  Not Applicable    Food  Yes, have food insecurities;Within past 12 months, food ran out with no money to buy more    Housing/Utilities  No permanent housing    Transportation  Yes, need transportation assistance    Interpersonal Safety  No, do not feel physically and emotionally safe where you currently live    Medication  Yes, have medication insecurities    Medical Provider  Yes    Referrals  Primary Care Provider/Clinic;Area Agency    Life-Saving Intervention Made  Not Applicable     Client would like CN to speak with shelter director regarding use of elevator. Client states he is preparing for hip replacement as soon as his viral levels have normalized. Client was previously connected with [facility]. CN will call Ambre NMcNeill for referral. SCAT application given.

## 2017-05-04 ENCOUNTER — Encounter: Payer: Self-pay | Admitting: Infectious Disease

## 2017-05-04 ENCOUNTER — Ambulatory Visit (INDEPENDENT_AMBULATORY_CARE_PROVIDER_SITE_OTHER): Payer: Medicare Other | Admitting: Infectious Disease

## 2017-05-04 VITALS — BP 104/71 | HR 71 | Temp 98.2°F | Ht 73.5 in | Wt 194.0 lb

## 2017-05-04 DIAGNOSIS — B20 Human immunodeficiency virus [HIV] disease: Secondary | ICD-10-CM

## 2017-05-04 DIAGNOSIS — L304 Erythema intertrigo: Secondary | ICD-10-CM | POA: Diagnosis not present

## 2017-05-04 DIAGNOSIS — K219 Gastro-esophageal reflux disease without esophagitis: Secondary | ICD-10-CM | POA: Diagnosis not present

## 2017-05-04 DIAGNOSIS — F332 Major depressive disorder, recurrent severe without psychotic features: Secondary | ICD-10-CM

## 2017-05-04 DIAGNOSIS — G909 Disorder of the autonomic nervous system, unspecified: Secondary | ICD-10-CM

## 2017-05-04 DIAGNOSIS — Z96649 Presence of unspecified artificial hip joint: Secondary | ICD-10-CM | POA: Diagnosis not present

## 2017-05-04 MED ORDER — GABAPENTIN 100 MG PO CAPS: 100.0000 mg | ORAL_CAPSULE | Freq: Three times a day (TID) | ORAL | 11 refills | Status: DC

## 2017-05-04 MED ORDER — VALACYCLOVIR HCL 500 MG PO TABS: 500.0000 mg | ORAL_TABLET | Freq: Every day | ORAL | 11 refills | Status: DC

## 2017-05-04 MED ORDER — DARUN-COBIC-EMTRICIT-TENOFAF 800-150-200-10 MG PO TABS: 1.0000 | ORAL_TABLET | Freq: Every day | ORAL | 5 refills | Status: DC

## 2017-05-04 MED ORDER — DOLUTEGRAVIR SODIUM 50 MG PO TABS: 50.0000 mg | ORAL_TABLET | Freq: Every day | ORAL | 5 refills | Status: DC

## 2017-05-04 MED ORDER — FLUCONAZOLE 200 MG PO TABS: 200.0000 mg | ORAL_TABLET | Freq: Every day | ORAL | 5 refills | Status: DC

## 2017-05-04 NOTE — Progress Notes (Signed)
Subjective:   Chief complaint: "I've got to get the virus under control so I can get this hip surgery"    Patient ID: [name], male    DOB: 05/18/1965, 52 y.o.   MRN: [id number]  HPI  52 year old with HIV/AIDS (though last CD4 170) who was dx in 1995 [facility], [facility] and seen by DecorahMinh. He has history of intermitent adherence and adherence had been poor-though he claimed to be taking meds reliably since meeting with Minh.  His viral load has come down from November when above 100K to 930 on January 28th vs 1340 on January 30th. It really should not have gone up ina few days though those different labs could have reflected assay variation.  I have anxiety that he may be failing with resistance. His Genotype ran for INSTI and no INSTI R but could not be run on the conventional genotype which is odd and means there must be 2 different tests being done  I will check a new genotype today as well with INSTI R.  I think he would benefit from going to [facility] and SYMTUZA to add in the INSTI.  He is also with multiple other complaints including intertrigo and asking for fluconazole as well as valtrex for HSV prevention, NSAID for pain, gabapentin  Past Medical History:  Diagnosis Date  . HIV (human immunodeficiency virus infection) (HCC)   . MRSA (methicillin resistant staph aureus) culture positive     Past Surgical History:  Procedure Laterality Date  . hip arthroplasty Left 2016   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, I&D with replacement in 2017    No family history on file.    Social History   Socioeconomic History  . Marital status: Legally Separated    Spouse name: None  . Number of children: None  . Years of education: None  . Highest education level: None  Social Needs  . Financial resource strain: None  . Food insecurity - worry: None  . Food insecurity - inability: None  . Transportation needs - medical: None  .  Transportation needs - non-medical: None  Occupational History  . None  Tobacco Use  . Smoking status: Never Smoker  . Smokeless tobacco: Current User    Types: Snuff  Substance and Sexual Activity  . Alcohol use: None  . Drug use: No  . Sexual activity: None  Other Topics Concern  . None  Social History Narrative  . None    Allergies  Allergen Reactions  . Sulfa Antibiotics Rash  . Tramadol Nausea Only     Current Outpatient Medications:  .  Darunavir-Cobicisctat-Emtricitabine-Tenofovir Alafenamide (SYMTUZA) 800-150-200-10 MG TABS, Take 1 tablet by mouth daily with breakfast., Disp: 30 tablet, Rfl: 5 .  dolutegravir (TIVICAY) 50 MG tablet, Take 1 tablet (50 mg total) by mouth daily., Disp: 30 tablet, Rfl: 5 .  fluconazole (DIFLUCAN) 200 MG tablet, Take 1 tablet (200 mg total) by mouth daily., Disp: 7 tablet, Rfl: 5 .  gabapentin (NEURONTIN) 100 MG capsule, Take 1 capsule (100 mg total) by mouth 3 (three) times daily., Disp: 30 capsule, Rfl: 11 .  ibuprofen (ADVIL,MOTRIN) 200 MG tablet, Take 800 mg every 4 (four) hours as needed by mouth for mild pain., Disp: , Rfl:    Review of Systems  Constitutional: Negative for activity change, appetite change, chills, diaphoresis, fatigue, fever and unexpected weight change.  HENT: Negative for congestion, rhinorrhea, sinus pressure, sneezing, sore throat and trouble  swallowing.   Eyes: Negative for photophobia and visual disturbance.  Respiratory: Negative for cough, chest tightness, shortness of breath, wheezing and stridor.   Cardiovascular: Negative for chest pain, palpitations and leg swelling.  Gastrointestinal: Negative for abdominal distention, abdominal pain, anal bleeding, blood in stool, constipation, diarrhea, nausea and vomiting.  Genitourinary: Negative for difficulty urinating, dysuria, flank pain and hematuria.  Musculoskeletal: Positive for myalgias. Negative for arthralgias, back pain, gait problem and joint swelling.    Skin: Positive for color change. Negative for pallor, rash and wound.  Neurological: Negative for dizziness, tremors, weakness and light-headedness.  Hematological: Negative for adenopathy. Does not bruise/bleed easily.  Psychiatric/Behavioral: Negative for agitation, behavioral problems, confusion, decreased concentration, dysphoric mood and sleep disturbance. The patient is nervous/anxious.        Objective:   Physical Exam  Constitutional: He is oriented to person, place, and time. He appears well-developed and well-nourished.  HENT:  Head: Normocephalic and atraumatic.  Eyes: Conjunctivae and EOM are normal.  Neck: Normal range of motion. Neck supple.  Cardiovascular: Normal rate and regular rhythm.  Pulmonary/Chest: Effort normal. No respiratory distress. He has no wheezes.  Abdominal: Soft. He exhibits no distension.  Musculoskeletal: Normal range of motion. He exhibits no edema or tenderness.  Neurological: He is alert and oriented to person, place, and time.  Skin: Skin is warm and dry. No rash noted. No erythema. No pallor.  Psychiatric: His behavior is normal. Thought content normal. His mood appears anxious. Cognition and memory are normal.          Assessment & Plan:   HIV/AIDS: we will recheck VL and genotype today. I will send TIvicay and SYMTUZA to [facility] on [address] for him where his wife will need to fill  HOPEFULLY he is not failing with R, but if he is we should be able to get data on this. IF we can suppress him I would like to run [employer] on him to look for archived R so we can optimize his regimen further.  His  CD4 has been high enough that I am not that worried aboutu PCP prophlyaxis which he was not taking in any case  Intertrigo: rx for fluconazole again  Pain: he can take ibuprofen or rx NSAID but not both  HSV : valtrex  HIp revision: he is trying to have further surgery with Dr. Roda Shutters. He has had history of hip  infection  Neuropathy: gabapentin written.   I spent greater than 45 minutes with the patient including greater than 50% of time in face to face counsel of the patient re concerns for resistant virus, re new regimen we would construct with hope that he is coming under control, vs need to construct a more complicated regimen and in coordination of his care with ID pharmacy.

## 2017-05-04 NOTE — Progress Notes (Signed)
HPI: [name] is a 52 y.o. male who is here to see Dr. Daiva EvesVan Dam for his HIV follow up.   Allergies: Allergies  Allergen Reactions  . Sulfa Antibiotics Rash  . Tramadol Nausea Only    Vitals: Temp: 98.2 F (36.8 C) (02/18 0904) Temp Source: Oral (02/18 0904) BP: 104/71 (02/18 0904) Pulse Rate: 71 (02/18 0904)  Past Medical History: Past Medical History:  Diagnosis Date  . HIV (human immunodeficiency virus infection) (HCC)   . MRSA (methicillin resistant staph aureus) culture positive     Social History: Social History   Socioeconomic History  . Marital status: Legally Separated    Spouse name: None  . Number of children: None  . Years of education: None  . Highest education level: None  Social Needs  . Financial resource strain: None  . Food insecurity - worry: None  . Food insecurity - inability: None  . Transportation needs - medical: None  . Transportation needs - non-medical: None  Occupational History  . None  Tobacco Use  . Smoking status: Never Smoker  . Smokeless tobacco: Current User    Types: Snuff  Substance and Sexual Activity  . Alcohol use: None  . Drug use: No  . Sexual activity: None  Other Topics Concern  . None  Social History Narrative  . None    Previous Regimen: TRV, Prescobix, atazantavir/ritonavir, Triumeq, various others  Current Regimen: Prezcobix/Descovy  Labs: HIV 1 RNA Quant (copies/mL)  Date Value  04/15/2017 1,340 (H)  04/13/2017 930 (H)  01/29/2017 111,000   CD4 T Cell Abs (/uL)  Date Value  04/15/2017 170 (L)  01/29/2017 150 (L)    CrCl: Estimated Creatinine Clearance: 103.5 mL/min (by C-G formula based on SCr of 0.99 mg/dL).  Lipids: No results found for: CHOL, TRIG, HDL, CHOLHDL, VLDL, LDLCALC  Assessment: [name] saw me a couple of weeks ago for his hospital follow up. Since that visit he has claimed to taking his meds religiously. His VL came back at around 1300 copies, therefore, they couldn't do  the genotype. He expects it to be way less now that he is on it consistently. Dr. Daiva EvesVan Dam will change him to Symtuza/Tivcay today. He requested to be sent to his [facility] pharmacy. Told him that it's likely that they don't have it in stock and he will have to wait till the next day or so. His partner is the same way for this lab issue. She is awaiting ADAP.   Forward all [facility] to [facility]. He would like to come back and see me in two weeks to discuss his labs again.   Recommendations:  Change to Symtuza/Tivicay 1 daily Labs today    Ulyses SouthwardMinh Aysel Gilchrest, PharmD, BCPS, AAHIVP, CPP Clinical Infectious Disease Pharmacist [facility] 05/04/2017, 10:10 AM

## 2017-05-05 LAB — HIV-1 GENOTYPE: HIV-1 Genotype: DETECTED — AB

## 2017-05-05 LAB — COMPLETE METABOLIC PANEL WITH GFR
AG RATIO: 1.3 (calc) (ref 1.0–2.5)
ALBUMIN MSPROF: 4.1 g/dL (ref 3.6–5.1)
ALT: 10 U/L (ref 9–46)
AST: 16 U/L (ref 10–40)
Alkaline phosphatase (APISO): 72 U/L (ref 40–115)
BUN: 15 mg/dL (ref 7–25)
CALCIUM: 9 mg/dL (ref 8.6–10.3)
CHLORIDE: 111 mmol/L — AB (ref 98–110)
CO2: 22 mmol/L (ref 20–32)
Creat: 1.12 mg/dL (ref 0.60–1.35)
GFR, EST AFRICAN AMERICAN: 89 mL/min/{1.73_m2} (ref >=60)
GFR, EST NON AFRICAN AMERICAN: 77 mL/min/{1.73_m2} (ref >=60)
Globulin: 3.1 g/dL (calc) (ref 1.9–3.7)
Glucose, Bld: 83 mg/dL (ref 65–99)
POTASSIUM: 4.1 mmol/L (ref 3.5–5.3)
Sodium: 140 mmol/L (ref 135–146)
TOTAL PROTEIN: 7.2 g/dL (ref 6.1–8.1)
Total Bilirubin: 0.3 mg/dL (ref 0.2–1.2)

## 2017-05-05 LAB — HIV RNA, RTPCR W/R GT (RTI, PI,INT)
HIV 1 RNA Quant: 1340 copies/mL — ABNORMAL HIGH
HIV-1 RNA Quant, Log: 3.13 Log copies/mL — ABNORMAL HIGH

## 2017-05-05 LAB — CBC WITH DIFFERENTIAL/PLATELET
Basophils Absolute: 43 cells/uL (ref 0–200)
Basophils Relative: 0.6 %
Eosinophils Absolute: 1138 cells/uL — ABNORMAL HIGH (ref 15–500)
Eosinophils Relative: 15.8 %
HCT: 34.6 % — ABNORMAL LOW (ref 38.5–50.0)
Hemoglobin: 11.8 g/dL — ABNORMAL LOW (ref 13.2–17.1)
Lymphs Abs: 1786 cells/uL (ref 850–3900)
MCH: 27.3 pg (ref 27.0–33.0)
MCHC: 34.1 g/dL (ref 32.0–36.0)
MCV: 79.9 fL — ABNORMAL LOW (ref 80.0–100.0)
MONOS PCT: 7.5 %
MPV: 10.2 fL (ref 7.5–12.5)
NEUTROS PCT: 51.3 %
Neutro Abs: 3694 cells/uL (ref 1500–7800)
PLATELETS: 222 10*3/uL (ref 140–400)
RBC: 4.33 10*6/uL (ref 4.20–5.80)
RDW: 15.5 % — AB (ref 11.0–15.0)
Total Lymphocyte: 24.8 %
WBC mixed population: 540 cells/uL (ref 200–950)
WBC: 7.2 10*3/uL (ref 3.8–10.8)

## 2017-05-05 LAB — T-HELPER CELL (CD4) - (RCID CLINIC ONLY)
CD4 % Helper T Cell: 14 % — ABNORMAL LOW (ref 33–55)
CD4 T Cell Abs: 250 /uL — ABNORMAL LOW (ref 400–2700)

## 2017-05-05 LAB — SPECIMEN COMPROMISED

## 2017-05-05 LAB — HIV-1 INTEGRASE GENOTYPE

## 2017-05-08 ENCOUNTER — Telehealth: Payer: Self-pay | Admitting: *Deleted

## 2017-05-08 NOTE — Telephone Encounter (Signed)
I approve of Mr Hulan FrayUpchurch plan of care as outlined.

## 2017-05-08 NOTE — Progress Notes (Signed)
I nitial Home visit made today at [facility] to offer assistance and assess the current needs. Currently [name] is concerned about the possible deterioration of his hip and the inability to have surgery. Per the notes surgery hasn't been scheduled  due to his poor medication adherence and instability.  He would like to have some assistance with housing, clothing and staying in care with Dr. Luciana Axeomer.  He states she has all her medications and continues to take the medications daily. Arranged a clinic visit for ADAP renewal        Initial Assessment Points to Consider for Care    Points are not all inclusive to services and educated provided but supports the patient's Individualized Plan of Care.  Is Home safe for visits?/ currently homeless and [facility] Are all firearms or weapons secure? yes  Insurance Coverage: no coverage  1st HIV Diagnosis: Not in [facility], patient stated he became HIV positive at the age of 52 28(1983)  Mode of HIV Transmission: heterosexual contact  Functional Status: gait instability due to deterioration of is hip  Current Housing/Needs: needs assistance with housing and clothing  Social Support/System: limited, patient has a significant other/[name]  Culture/Religion/Spirituality: not expressed  Educational Background: high school Chiropractoreducation  Legal Issues: none Dietitianpending  Access/Utilization of Community Resources: can assessed and linked  Mental Health Concerns/Diagnosis: none noted  Alcohol and/or Drug Use: none expressed  Risk and Knowledge of HIV and Reduction in Transmission: has basic understanding of HIV reduction and transmission  Nutritional Needs: unable to afford food and meals [facility] Order received on 04/14/16 by [place] RN/Dr Daiva EvesVan Dam to evaluate patient for [facility]).  Patient was evaluated on 04/15/16 for [facility]. Patient was consented to care at this time.   Frequency / Duration of [facility]N visits: Effective 04/15/17 571mo1, 713mo1, 272mo2 4 PRN's for complications with disease process/progression, medication changes or concerns  [facility]N will assess for learning needs related to diagnosis and treatment regimen, provide education as needed, fill pill box if needed, address any barriers which may be preventing medication compliance, and communicating with care team including physician and case manager.  Individualized Plan Of Care, Certification Period of 04/15/17 to 07/14/2017  a. Type of service(s) and care to be delivered: RN Case Management  b. Frequency and duration of service: Effective 04/15/17: 661mo1, 703mo1, 422mo2, 4 prns for complications with disease process/progression, medication changes or concerns . Visits/Contact may be conducted telephonically or in person to best suite the patient.  c. Activity restrictions: Pt may be up as tolerated and can safely ambulate without the need for a assistive device  d. Safety Measures: Standard Precautions/Infection Control  e. Service Objectives and Goals: Service Objectives are to assist the pt with HIV medication regimen adherence and staying in care with the Infectious Disease Clinic by identifying barriers to care. RN will address the barriers that are identified by the patient. Patient stated goals is to have a suppressed viral load so he can be approved for surgery to his hip, secure housing and clothing. Patient states he has his medications and has been adherent.  f. Equipment required: No additional equipment needs at this time  g. Functional Limitations: ambulation is altered due to his deteriorating hip  h. Rehabilitation potential: Guarded  i. Diet and Nutritional Needs: Regular Diet  j. Medications and treatments: Medications have been reconciled and reviewed and are a part of EPIC electronic file  k.  Specific therapies if needed: RN  l. Pertinent diagnoses: HIV disease, Hx of medication NonCompliance,  substance use, idiopathic aseptic necrosis of right femur, Hx of infected rt total hip replacement, osteoarthritis of both hips  m. Expected outcome: Guarded

## 2017-05-08 NOTE — Telephone Encounter (Addendum)
Initial Assessment Points to Consider for Care  Points are not all inclusive to services and educated provided but supports the patient's Individualized Plan of Care.  . Is Home safe for visits?/ currently homeless and [facility] Are all firearms or weapons secure? yes . Insurance Coverage: no coverage . 1st HIV Diagnosis: Not in [facility], patient stated he became HIV positive at the age of 52 27(1983) . Mode of HIV Transmission: heterosexual contact . Functional Status: gait instability due to deterioration of is hip . Current Housing/Needs: needs assistance with housing and clothing . Social Support/System: limited, patient has a significant other/Kimberly Elkins . Culture/Religion/Spirituality: not expressed . Educational Background: high school education . Legal Issues: none pending . Access/Utilization of Community Resources: can assessed and linked . Mental Health Concerns/Diagnosis: none noted . Alcohol and/or Drug Use: none expressed  . Risk and Knowledge of HIV and Reduction in Transmission: has basic understanding of HIV reduction and transmission . Nutritional Needs: unable to afford food and meals are provided through the [facility]  Order received on 04/14/16 by [place] RN/Dr Daiva EvesVan Dam  to evaluate patient [facility]).  Patient was evaluated on 04/15/16 for [facility]. Patient was consented to care at this time.   Frequency / Duration of CBHCN visits: Effective 04/15/17 291mo1, 193mo1, 512mo2  4 PRN's for complications with disease process/progression, medication changes or concerns   [facility]N will assess for learning needs related to diagnosis and treatment regimen, provide education as needed, fill pill box if needed, address any barriers which may be preventing medication compliance, and communicating with care team including physician and case manager.   Individualized Plan Of Care, Certification Period of 04/15/17 to  07/14/2017  a. Type of service(s) and care to be delivered: RN Case Management  b. Frequency and duration of service: Effective 04/15/17: 661mo1, 493mo1, 642mo2,  4 prns for complications with disease process/progression, medication changes or concerns . Visits/Contact may be  conducted telephonically or in person to  best suite the patient.  c. Activity restrictions: Pt may be up as tolerated and can safely ambulate without the need for a assistive device   d. Safety Measures: Standard Precautions/Infection Control   e. Service Objectives and Goals: Service Objectives are to assist the pt with HIV medication regimen adherence and staying in care with the Infectious Disease Clinic by identifying barriers to care. RN  will address the barriers that are identified by the patient. Patient stated goals is to have a suppressed viral load so he can be approved for surgery to his hip, secure housing and clothing. Patient states  he has his medications and has been adherent.   f. Equipment required: No additional equipment needs at this time   g. Functional Limitations: ambulation is altered due to his deteriorating hip  h. Rehabilitation potential: Guarded   i. Diet and Nutritional Needs: Regular Diet   j. Medications and treatments: Medications have been reconciled and reviewed and are a part of EPIC electronic file   k. Specific therapies if needed: RN   l. Pertinent diagnoses: HIV disease,  Hx of medication NonCompliance, substance use, idiopathic aseptic necrosis of right femur, Hx of infected rt total hip replacement, osteoarthritis of both hips  m. Expected outcome: Guarded

## 2017-05-09 LAB — HIV RNA, RTPCR W/R GT (RTI, PI,INT)
HIV 1 RNA Quant: 92 copies/mL — ABNORMAL HIGH
HIV-1 RNA QUANT, LOG: 1.96 {Log_copies}/mL — AB

## 2017-05-18 ENCOUNTER — Ambulatory Visit: Payer: Medicare Other

## 2017-05-18 ENCOUNTER — Ambulatory Visit (INDEPENDENT_AMBULATORY_CARE_PROVIDER_SITE_OTHER): Payer: Medicare Other | Admitting: Orthopaedic Surgery

## 2017-05-18 ENCOUNTER — Telehealth: Payer: Self-pay | Admitting: *Deleted

## 2017-05-18 NOTE — Telephone Encounter (Signed)
RN submitted a referral to [employer] for clothing donations. Received a call back from Ashley  [place] and we arranged a clothing pick up for this Thursday at 1pm 

## 2017-05-22 ENCOUNTER — Telehealth: Payer: Self-pay | Admitting: *Deleted

## 2017-05-22 ENCOUNTER — Encounter: Payer: Self-pay | Admitting: *Deleted

## 2017-05-22 NOTE — Telephone Encounter (Signed)
[facility] to [facility] but he no longer resides there. Call and text message sent to Mr [name] asking him to please return my call because I have his clothing.

## 2017-05-23 NOTE — Congregational Nurse Program (Signed)
Congregational Nurse Program Note  Date of Encounter: 04/28/2017  Past Medical History: Past Medical History:  Diagnosis Date  . HIV (human immunodeficiency virus infection) (HCC)   . MRSA (methicillin resistant staph aureus) culture positive     Encounter Details: CNP Questionnaire - 04/28/17 0930      Questionnaire   Patient Status  Not Applicable    Race  White or Caucasian    [facility]    Insurance  Medicaid    Food  Yes, have food insecurities;Within past 12 months, worried food would run out with no money to buy more    Housing/Utilities  No permanent housing    Transportation  Yes, need transportation assistance    Interpersonal Safety  No, do not feel physically and emotionally safe where you currently live    Medication  Yes, have medication insecurities    Medical Provider  Yes    Referrals  Area Agency;Other    ED Visit Averted  Not Applicable    Life-Saving Intervention Made  Not Applicable      Cn provided SCAT form. Client also needs medications form [facility]. CN will contact RN case manager for assistance.

## 2017-06-01 ENCOUNTER — Ambulatory Visit: Payer: Medicare Other | Admitting: Infectious Disease

## 2017-06-02 ENCOUNTER — Ambulatory Visit: Payer: Self-pay | Admitting: *Deleted

## 2017-06-02 DIAGNOSIS — B20 Human immunodeficiency virus [HIV] disease: Secondary | ICD-10-CM

## 2017-06-04 ENCOUNTER — Telehealth (INDEPENDENT_AMBULATORY_CARE_PROVIDER_SITE_OTHER): Payer: Self-pay | Admitting: Orthopaedic Surgery

## 2017-06-04 NOTE — Telephone Encounter (Signed)
It looks like his labs are trending in a positive direction but they're not completely where I want them to be.  When is he supposed to repeat his lab work?

## 2017-06-04 NOTE — Telephone Encounter (Signed)
Patient called stating that his levels are low and wanted to know if Dr. Roda ShuttersXu is now willing to do the surgery now that they are low.  He stated that his hip is really bad and it is almost unbearable.  CB#[phone].  Thank you.

## 2017-06-04 NOTE — Telephone Encounter (Signed)
See message below °

## 2017-06-05 NOTE — Telephone Encounter (Signed)
He doesn't need to go to ED.  This is not an emergency.

## 2017-06-05 NOTE — Telephone Encounter (Signed)
Patient called back and stated no labs currently scheduled.  Asked for Dr. Roda ShuttersXu to send order today so he could go tomorrow for labs.  I advised Dr. Roda ShuttersXu in surgery could send message for Monday.  Patient stated [facility] would [facility].  He will ask for labs and admission for emergency surgery for his hip.  He at least hopes to get labs done by going to ED.

## 2017-06-05 NOTE — Telephone Encounter (Signed)
I called patient to return a voice mail about this.  I left him a message to call back.

## 2017-06-07 ENCOUNTER — Emergency Department (HOSPITAL_COMMUNITY): Payer: Medicare Other

## 2017-06-07 ENCOUNTER — Encounter (HOSPITAL_COMMUNITY): Payer: Self-pay | Admitting: Emergency Medicine

## 2017-06-07 ENCOUNTER — Emergency Department (HOSPITAL_COMMUNITY)
Admission: EM | Admit: 2017-06-07 | Discharge: 2017-06-07 | Disposition: A | Discharge status: Home / Self Care | Payer: Medicare Other | Attending: Emergency Medicine | Admitting: Emergency Medicine

## 2017-06-07 DIAGNOSIS — B20 Human immunodeficiency virus [HIV] disease: Secondary | ICD-10-CM | POA: Diagnosis not present

## 2017-06-07 DIAGNOSIS — M25551 Pain in right hip: Secondary | ICD-10-CM | POA: Diagnosis not present

## 2017-06-07 DIAGNOSIS — Z79899 Other long term (current) drug therapy: Secondary | ICD-10-CM | POA: Diagnosis not present

## 2017-06-07 DIAGNOSIS — S3993XA Unspecified injury of pelvis, initial encounter: Secondary | ICD-10-CM | POA: Diagnosis not present

## 2017-06-07 DIAGNOSIS — M25561 Pain in right knee: Secondary | ICD-10-CM | POA: Insufficient documentation

## 2017-06-07 DIAGNOSIS — J45909 Unspecified asthma, uncomplicated: Secondary | ICD-10-CM | POA: Insufficient documentation

## 2017-06-07 DIAGNOSIS — Z86718 Personal history of other venous thrombosis and embolism: Secondary | ICD-10-CM | POA: Insufficient documentation

## 2017-06-07 DIAGNOSIS — M79651 Pain in right thigh: Secondary | ICD-10-CM | POA: Diagnosis not present

## 2017-06-07 DIAGNOSIS — M549 Dorsalgia, unspecified: Secondary | ICD-10-CM | POA: Diagnosis not present

## 2017-06-07 DIAGNOSIS — R52 Pain, unspecified: Secondary | ICD-10-CM

## 2017-06-07 MED ORDER — IBUPROFEN 600 MG PO TABS: 600.0000 mg | ORAL_TABLET | Freq: Four times a day (QID) | ORAL | 0 refills | Status: DC | PRN

## 2017-06-07 MED ORDER — OXYCODONE-ACETAMINOPHEN 5-325 MG PO TABS
1.0000 | ORAL_TABLET | Freq: Once | ORAL | Status: AC
  Administered 2017-06-07: 1 via ORAL
  Filled 2017-06-07: qty 1

## 2017-06-07 MED ORDER — OXYCODONE-ACETAMINOPHEN 5-325 MG PO TABS: 1.0000 | ORAL_TABLET | Freq: Four times a day (QID) | ORAL | 0 refills | Status: DC | PRN

## 2017-06-07 MED ORDER — HYDROMORPHONE HCL 1 MG/ML IJ SOLN
1.0000 mg | Freq: Once | INTRAMUSCULAR | Status: AC
  Administered 2017-06-07: 1 mg via INTRAMUSCULAR
  Filled 2017-06-07: qty 1

## 2017-06-07 NOTE — ED Provider Notes (Signed)
[facility] EMERGENCY DEPARTMENT Provider Note   CSN: [id number] Arrival date & time: 06/07/17  1907   History   Chief Complaint Chief Complaint  Patient presents with  . Hip Pain  . Knee Pain    HPI [name] is a 52 y.o. male who presents with right hip and knee pain.  Past medical history significant for HIV, arthritis of bilateral hips status post left total hip replacement, aseptic necrosis of the right femur.  Patient states that he has been seeing Dr. Roda Shutters with orthopedics.  Plan was to have a total hip replacement on the right side when his CD4 count was improved.  Sees Dr. Daiva Eves with ID. Tonight he was walking down steps and felt a acute onset of pain when stepping down.  He felt a "crunch or snap". The pain starts over his right knee and radiates to his right hip and to his back.  He has not been able to ambulate after the pain started.  He also has neck pain when moving his neck side to side.  He did not have a fall. He reports numbness of the right thigh. Also feels like his leg has been "vibrating" but this has been going on for a month.  HPI  Past Medical History:  Diagnosis Date  . HIV (human immunodeficiency virus infection) (HCC)   . MRSA (methicillin resistant staph aureus) culture positive     Patient Active Problem List   Diagnosis Date Noted  . Asthma 04/22/2017  . History of Clostridium difficile 04/22/2017  . Infectious folliculitis 01/30/2017  . Folliculitis 01/30/2017  . Complication of implanted device 07/24/2015  . 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 GERD (gastroesophageal reflux disease) 01/25/2015  . Hypotension 01/25/2015  . HIV disease (HCC) 01/24/2015  . Primary osteoarthritis of both hips 03/29/2013  . History of pulmonary embolus (PE) 03/17/2013  . 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 Authorizing Provider  Darunavir-Cobicisctat-Emtricitabine-Tenofovir Alafenamide [facility]) 800-150-200-10 MG TABS Take 1 tablet by mouth daily with breakfast. 05/04/17   Daiva Eves, Lisette Grinder, MD  dolutegravir (TIVICAY) 50 MG tablet Take 1 tablet (50 mg total) by mouth daily. 05/04/17   Randall Hiss, MD  fluconazole (DIFLUCAN) 200 MG tablet Take 1 tablet (200 mg total) by mouth daily. 05/04/17   Randall Hiss, MD  gabapentin (NEURONTIN) 100 MG capsule Take 1 capsule (100 mg total) by mouth 3 (three) times daily. 05/04/17   Randall Hiss, MD  ibuprofen (ADVIL,MOTRIN) 200 MG tablet Take 800 mg every 4 (four) hours as needed by mouth for mild pain.    [provider]  valACYclovir (VALTREX) 500 MG tablet Take 1 tablet (500 mg total) by mouth daily. 05/04/17   Randall Hiss, MD    Family History No family history on file.  Social History Social History   Tobacco Use  . Smoking status: Never Smoker  . Smokeless tobacco: Current User    Types: Snuff  Substance Use Topics  . Alcohol use: Not on file  . Drug use:  No     Allergies   Sulfa antibiotics and Tramadol   Review of Systems Review of Systems  Musculoskeletal: Positive for arthralgias and back pain.  Allergic/Immunologic: Positive for immunocompromised state.  All other systems reviewed and are negative.    Physical Exam Updated Vital Signs BP 124/71   Pulse 63   Temp 97.7 F (36.5 C) (Oral)   SpO2 99%   Physical Exam  Constitutional: He is oriented to person, place, and time. He appears well-developed and well-nourished. No distress.  Uncomfortable appearing. Calm, cooperative  HENT:  Head: Normocephalic and atraumatic.   Eyes: Pupils are equal, round, and reactive to light. Conjunctivae are normal. Right eye exhibits no discharge. Left eye exhibits no discharge. No scleral icterus.  Neck: Normal range of motion.  Cardiovascular: Normal rate.  Pulmonary/Chest: Effort normal. No respiratory distress.  Abdominal: He exhibits no distension.  Musculoskeletal:  Back: Inspection: No masses, deformity, or rash Palpation: Diffuse low back tenderness Strength: Unable to assess in right lower Sensation: Reported numbness of the right thigh  Right hip: Tenderness over lateral hip. No anterior tenderness. Pt refuses ROM.  Right knee: No obvious swelling, deformity, or warmth. Tenderness to palpation of lateral knee. Pt refuses ROM. N/V intact.    Neurological: He is alert and oriented to person, place, and time.  Skin: Skin is warm and dry.  Psychiatric: He has a normal mood and affect. His behavior is normal.  Nursing note and vitals reviewed.    ED Treatments / Results  Labs (all labs ordered are listed, but only abnormal results are displayed) Labs Reviewed  HIV 1 RNA QUANT-NO REFLEX-BLD  T-HELPER CELLS (CD4) COUNT (NOT AT [facility])    EKG None  Radiology Ct Pelvis Wo Contrast  Result Date: 06/07/2017 CLINICAL DATA:  52 year old male with right hip pain. EXAM: CT PELVIS WITHOUT CONTRAST TECHNIQUE: Multidetector CT imaging of the pelvis was performed following the standard protocol without intravenous contrast. COMPARISON:  Pelvic radiograph dated 04/13/2017 and CT of the abdomen pelvis dated 10/21/2013. FINDINGS: Urinary Tract: The visualized distal ureters and urinary bladder appear unremarkable. Bowel: No bowel dilatation or active inflammation within the pelvis. Vascular/Lymphatic: The visualized vasculature appear unremarkable. A top-normal left external iliac chain lymph node measures 1 cm in short axis, of indeterminate clinical significance, likely reactive. Reproductive: The prostate and seminal  vesicles are grossly unremarkable. Other:  None Musculoskeletal: There is a total left arthroplasty. The arthroplasty components appear intact and in anatomic alignment. There is heterotopic bone formation adjacent to the left greater trochanter. The appearance of the arthroplasty and acetabular screws are similar to the prior CT. There is osteopenia of the left hip primarily adjacent to the acetabular cup, increased since the prior CT. There is severe osteoarthritis of the right hip with slight flattening of the right femoral head and narrowing of the joint space similar or slightly progressed compared to the CT of 2015. There is no acute fracture. No dislocation. IMPRESSION: Severe osteoarthritic changes of the right hip similar or slightly progressed compared to the prior CT. Mild flattening and fragmentation of the right femoral head may be sequela of underlying avascular necrosis. Left hip arthroplasty appears intact. Progression of osteopenia surrounding the acetabular cup of the left hip. No acute fracture. Electronically Signed   By: Elgie CollardArash  Radparvar M.D.   On: 06/07/2017 22:29   Dg Knee Complete 4 Views Right  Result Date: 06/07/2017 CLINICAL DATA:  Knee pain EXAM: RIGHT KNEE - COMPLETE 4+ VIEW COMPARISON:  None.  FINDINGS: No evidence of fracture, dislocation, or joint effusion. No evidence of arthropathy or other focal bone abnormality. Soft tissues are unremarkable. IMPRESSION: Negative. Electronically Signed   By: Kennith Center M.D.   On: 06/07/2017 22:38   Dg Femur, Min 2 Views Right  Result Date: 06/07/2017 CLINICAL DATA:  Pain. EXAM: RIGHT FEMUR 2 VIEWS COMPARISON:  None. FINDINGS: Marked loss of joint space with subchondral sclerosis and subchondral cyst formation noted in the acetabulum and femoral head. Bones are demineralized. No evidence for femur fracture. No suspicious lytic or sclerotic osseous abnormality within the femur. IMPRESSION: Degenerative changes in the hip without acute bony  abnormality. Electronically Signed   By: Kennith Center M.D.   On: 06/07/2017 20:35    Procedures Procedures (including critical care time)  Medications Ordered in ED Medications  oxyCODONE-acetaminophen (PERCOCET/ROXICET) 5-325 MG per tablet 1 tablet (1 tablet Oral Given 06/07/17 2035)  HYDROmorphone (DILAUDID) injection 1 mg (1 mg Intramuscular Given 06/07/17 2348)     Initial Impression / Assessment and Plan / ED Course  I have reviewed the triage vital signs and the nursing notes.  Pertinent labs & imaging results that were available during my care of the patient were reviewed by me and considered in my medical decision making (see chart for details).  52 year old male presents with acute on chronic right hip and knee pain. His exam is overall unremarkable. He reports significant pain with what sounds like a very minor injury. After chart review, suspect he may have come to expedite things with his hip replacement. He feels like Orthopedics are taking too much time to fix his hip. Xray of femur and right knee are negative. CT of pelvis is remarkable for mildly progressed arthritis but no acute fracture. He was given pain control and crutches and advised to follow up with orthopedics. He is requesting that his CD4 count and viral load are retested. I advised him that these are not emergency tests but will send them off. Return precautions given.  Final Clinical Impressions(s) / ED Diagnoses   Final diagnoses:  Right hip pain  Acute pain of right knee    ED Discharge Orders    None       Bethel Born, PA-C 06/08/17 0050    Vanetta Mulders, MD 06/11/17 [phone]

## 2017-06-07 NOTE — ED Notes (Signed)
Pt departed in NAD.  

## 2017-06-07 NOTE — ED Notes (Signed)
Patient transported to CT 

## 2017-06-07 NOTE — Discharge Instructions (Addendum)
Take pain medicine as needed for severe pain. Take with Ibuprofen Follow up with either either Dr. Roda ShuttersXu or Dr. Everardo PacificVarkey

## 2017-06-07 NOTE — ED Triage Notes (Signed)
Pt reports right hip pain for the past three hours.  Pt reports he felt a "crunch or click" as he was coming down some stairs and has had pain ever since.

## 2017-06-08 ENCOUNTER — Ambulatory Visit: Payer: Medicare Other | Admitting: Infectious Disease

## 2017-06-08 LAB — T-HELPER CELLS (CD4) COUNT (NOT AT ARMC)
CD4 % Helper T Cell: 14 % — ABNORMAL LOW (ref 33–55)
CD4 T Cell Abs: 280 /uL — ABNORMAL LOW (ref 400–2700)

## 2017-06-08 NOTE — Progress Notes (Signed)
RN received a call from Ms. Elkins/Sarvis stating [facility] and have a room at [facility] ([address] Room [address] and would like to know if I can bring her the donated clothes. I agreed to meet them.  We meet in the parking lot of the hotel since the environment is not safe. The couple stated they are doing much better. We reviewed their upcoming appts with Dr Daiva EvesVan Dam for the 25th of March. At this time we also reviewed their most recent viral loads with education on the goals of less than 200/viral load and a CD4 that continues to rise based off how healthy of a lifestyle they maintain and medication adherence.

## 2017-06-09 LAB — HIV-1 RNA QUANT-NO REFLEX-BLD
HIV 1 RNA Quant: <20 copies/mL
LOG10 HIV-1 RNA: UNDETERMINED log10copy/mL

## 2017-06-14 ENCOUNTER — Inpatient Hospital Stay (HOSPITAL_COMMUNITY)
Admission: EM | Admit: 2017-06-14 | Discharge: 2017-06-18 | DRG: 871 | Disposition: A | Discharge status: Home / Self Care | Payer: Medicare Other | Attending: Family Medicine | Admitting: Family Medicine

## 2017-06-14 ENCOUNTER — Emergency Department (HOSPITAL_COMMUNITY): Payer: Medicare Other

## 2017-06-14 ENCOUNTER — Encounter (HOSPITAL_COMMUNITY): Payer: Self-pay | Admitting: Emergency Medicine

## 2017-06-14 ENCOUNTER — Other Ambulatory Visit: Payer: Self-pay

## 2017-06-14 DIAGNOSIS — J189 Pneumonia, unspecified organism: Secondary | ICD-10-CM

## 2017-06-14 DIAGNOSIS — M160 Bilateral primary osteoarthritis of hip: Secondary | ICD-10-CM | POA: Diagnosis present

## 2017-06-14 DIAGNOSIS — R7881 Bacteremia: Secondary | ICD-10-CM | POA: Diagnosis not present

## 2017-06-14 DIAGNOSIS — J13 Pneumonia due to Streptococcus pneumoniae: Secondary | ICD-10-CM | POA: Diagnosis not present

## 2017-06-14 DIAGNOSIS — Z66 Do not resuscitate: Secondary | ICD-10-CM | POA: Diagnosis present

## 2017-06-14 DIAGNOSIS — Z86711 Personal history of pulmonary embolism: Secondary | ICD-10-CM

## 2017-06-14 DIAGNOSIS — A403 Sepsis due to Streptococcus pneumoniae: Principal | ICD-10-CM | POA: Diagnosis present

## 2017-06-14 DIAGNOSIS — J851 Abscess of lung with pneumonia: Secondary | ICD-10-CM | POA: Diagnosis present

## 2017-06-14 DIAGNOSIS — A419 Sepsis, unspecified organism: Secondary | ICD-10-CM | POA: Insufficient documentation

## 2017-06-14 DIAGNOSIS — Z79899 Other long term (current) drug therapy: Secondary | ICD-10-CM

## 2017-06-14 DIAGNOSIS — I959 Hypotension, unspecified: Secondary | ICD-10-CM | POA: Diagnosis present

## 2017-06-14 DIAGNOSIS — J45909 Unspecified asthma, uncomplicated: Secondary | ICD-10-CM | POA: Diagnosis present

## 2017-06-14 DIAGNOSIS — Z96642 Presence of left artificial hip joint: Secondary | ICD-10-CM | POA: Diagnosis present

## 2017-06-14 DIAGNOSIS — F332 Major depressive disorder, recurrent severe without psychotic features: Secondary | ICD-10-CM | POA: Diagnosis not present

## 2017-06-14 DIAGNOSIS — N179 Acute kidney failure, unspecified: Secondary | ICD-10-CM | POA: Diagnosis not present

## 2017-06-14 DIAGNOSIS — B20 Human immunodeficiency virus [HIV] disease: Secondary | ICD-10-CM | POA: Diagnosis present

## 2017-06-14 DIAGNOSIS — D649 Anemia, unspecified: Secondary | ICD-10-CM | POA: Diagnosis present

## 2017-06-14 DIAGNOSIS — R918 Other nonspecific abnormal finding of lung field: Secondary | ICD-10-CM

## 2017-06-14 DIAGNOSIS — Z21 Asymptomatic human immunodeficiency virus [HIV] infection status: Secondary | ICD-10-CM

## 2017-06-14 DIAGNOSIS — J181 Lobar pneumonia, unspecified organism: Secondary | ICD-10-CM

## 2017-06-14 DIAGNOSIS — F17210 Nicotine dependence, cigarettes, uncomplicated: Secondary | ICD-10-CM | POA: Diagnosis present

## 2017-06-14 DIAGNOSIS — E8809 Other disorders of plasma-protein metabolism, not elsewhere classified: Secondary | ICD-10-CM | POA: Diagnosis present

## 2017-06-14 DIAGNOSIS — R05 Cough: Secondary | ICD-10-CM | POA: Diagnosis not present

## 2017-06-14 DIAGNOSIS — Z885 Allergy status to narcotic agent status: Secondary | ICD-10-CM

## 2017-06-14 DIAGNOSIS — R042 Hemoptysis: Secondary | ICD-10-CM | POA: Diagnosis present

## 2017-06-14 DIAGNOSIS — M879 Osteonecrosis, unspecified: Secondary | ICD-10-CM | POA: Diagnosis present

## 2017-06-14 DIAGNOSIS — R0602 Shortness of breath: Secondary | ICD-10-CM | POA: Diagnosis not present

## 2017-06-14 DIAGNOSIS — R1084 Generalized abdominal pain: Secondary | ICD-10-CM | POA: Diagnosis not present

## 2017-06-14 DIAGNOSIS — K219 Gastro-esophageal reflux disease without esophagitis: Secondary | ICD-10-CM | POA: Diagnosis present

## 2017-06-14 DIAGNOSIS — R079 Chest pain, unspecified: Secondary | ICD-10-CM | POA: Diagnosis not present

## 2017-06-14 DIAGNOSIS — Z882 Allergy status to sulfonamides status: Secondary | ICD-10-CM | POA: Diagnosis not present

## 2017-06-14 DIAGNOSIS — R000 Tachycardia, unspecified: Secondary | ICD-10-CM | POA: Diagnosis not present

## 2017-06-14 DIAGNOSIS — E876 Hypokalemia: Secondary | ICD-10-CM | POA: Insufficient documentation

## 2017-06-14 LAB — DIFFERENTIAL
BASOS PCT: 0 %
Basophils Absolute: 0 10*3/uL (ref 0.0–0.1)
Eosinophils Absolute: 0.3 10*3/uL (ref 0.0–0.7)
Eosinophils Relative: 2 %
Lymphocytes Relative: 7 %
Lymphs Abs: 1.2 10*3/uL (ref 0.7–4.0)
Monocytes Absolute: 1.2 10*3/uL (ref 0.1–1.0)
Monocytes Relative: 7 %
NEUTROS ABS: 14.6 10*3/uL (ref 1.7–7.7)
NEUTROS PCT: 84 %

## 2017-06-14 LAB — BASIC METABOLIC PANEL
Anion gap: 10 (ref 5–15)
BUN: 17 mg/dL (ref 6–20)
CHLORIDE: 110 mmol/L (ref 101–111)
CO2: 17 mmol/L — AB (ref 22–32)
CREATININE: 1.28 mg/dL — AB (ref 0.61–1.24)
Calcium: 8.6 mg/dL — ABNORMAL LOW (ref 8.9–10.3)
GFR calc non Af Amer: >60 mL/min (ref >60)
GLUCOSE: 100 mg/dL — AB (ref 65–99)
Potassium: 2.9 mmol/L — ABNORMAL LOW (ref 3.5–5.1)
Sodium: 137 mmol/L (ref 135–145)

## 2017-06-14 LAB — PROTIME-INR
INR: 1.46
PROTHROMBIN TIME: 17.6 s — AB (ref 11.4–15.2)

## 2017-06-14 LAB — HEPATIC FUNCTION PANEL
ALBUMIN: 2.9 g/dL — AB (ref 3.5–5.0)
ALK PHOS: 105 U/L (ref 38–126)
ALT: 27 U/L (ref 17–63)
AST: 28 U/L (ref 15–41)
BILIRUBIN DIRECT: 0.5 mg/dL (ref 0.1–0.5)
BILIRUBIN INDIRECT: 0.5 mg/dL (ref 0.3–0.9)
BILIRUBIN TOTAL: 1 mg/dL (ref 0.3–1.2)
Total Protein: 7.2 g/dL (ref 6.5–8.1)

## 2017-06-14 LAB — CBC
HCT: 33.9 % — ABNORMAL LOW (ref 39.0–52.0)
HEMOGLOBIN: 11.7 g/dL — AB (ref 13.0–17.0)
MCH: 26.5 pg (ref 26.0–34.0)
MCHC: 34.5 g/dL (ref 30.0–36.0)
MCV: 76.7 fL — AB (ref 78.0–100.0)
PLATELETS: 170 10*3/uL (ref 150–400)
RBC: 4.42 MIL/uL (ref 4.22–5.81)
RDW: 15.6 % — ABNORMAL HIGH (ref 11.5–15.5)
WBC: 17.5 10*3/uL — ABNORMAL HIGH (ref 4.0–10.5)

## 2017-06-14 LAB — I-STAT TROPONIN, ED: Troponin i, poc: 0.03 ng/mL (ref 0.00–0.08)

## 2017-06-14 LAB — LIPASE, BLOOD: Lipase: 18 U/L (ref 11–51)

## 2017-06-14 LAB — APTT: APTT: 31 s (ref 24–36)

## 2017-06-14 LAB — I-STAT CG4 LACTIC ACID, ED
LACTIC ACID, VENOUS: 1.22 mmol/L (ref 0.5–1.9)
LACTIC ACID, VENOUS: 0.85 mmol/L (ref 0.5–1.9)

## 2017-06-14 LAB — TROPONIN I

## 2017-06-14 MED ORDER — DARUNAVIR-COBICISTAT 800-150 MG PO TABS
1.0000 | ORAL_TABLET | Freq: Every day | ORAL | Status: DC
  Administered 2017-06-15 – 2017-06-18 (×4): 1 via ORAL
  Filled 2017-06-14 (×5): qty 1

## 2017-06-14 MED ORDER — SODIUM CHLORIDE 0.9 % IV SOLN
1.0000 g | Freq: Three times a day (TID) | INTRAVENOUS | Status: DC
  Administered 2017-06-14 – 2017-06-15 (×2): 1 g via INTRAVENOUS
  Filled 2017-06-14 (×3): qty 1

## 2017-06-14 MED ORDER — METHOCARBAMOL 1000 MG/10ML IJ SOLN
1000.0000 mg | Freq: Once | INTRAMUSCULAR | Status: AC
  Administered 2017-06-14: 1000 mg via INTRAVENOUS
  Filled 2017-06-14: qty 10

## 2017-06-14 MED ORDER — EMTRICITABINE-TENOFOVIR AF 200-25 MG PO TABS
1.0000 | ORAL_TABLET | Freq: Every day | ORAL | Status: DC
  Administered 2017-06-15 – 2017-06-18 (×4): 1 via ORAL
  Filled 2017-06-14 (×4): qty 1

## 2017-06-14 MED ORDER — HYDROXYZINE HCL 25 MG PO TABS
25.0000 mg | ORAL_TABLET | Freq: Every evening | ORAL | Status: DC | PRN
  Administered 2017-06-14: 25 mg via ORAL
  Filled 2017-06-14: qty 1

## 2017-06-14 MED ORDER — ADULT MULTIVITAMIN W/MINERALS CH
1.0000 | ORAL_TABLET | Freq: Every day | ORAL | Status: DC
  Administered 2017-06-14 – 2017-06-18 (×5): 1 via ORAL
  Filled 2017-06-14 (×5): qty 1

## 2017-06-14 MED ORDER — ACETAMINOPHEN 650 MG RE SUPP: 650.0000 mg | Freq: Four times a day (QID) | RECTAL | Status: DC | PRN

## 2017-06-14 MED ORDER — POTASSIUM CHLORIDE CRYS ER 20 MEQ PO TBCR
40.0000 meq | EXTENDED_RELEASE_TABLET | Freq: Once | ORAL | Status: AC
  Administered 2017-06-14: 40 meq via ORAL
  Filled 2017-06-14: qty 2

## 2017-06-14 MED ORDER — PANTOPRAZOLE SODIUM 40 MG PO TBEC
40.0000 mg | DELAYED_RELEASE_TABLET | Freq: Every day | ORAL | Status: DC
  Administered 2017-06-14 – 2017-06-18 (×5): 40 mg via ORAL
  Filled 2017-06-14 (×5): qty 1

## 2017-06-14 MED ORDER — KETOROLAC TROMETHAMINE 30 MG/ML IJ SOLN
30.0000 mg | Freq: Once | INTRAMUSCULAR | Status: AC
  Administered 2017-06-14: 30 mg via INTRAVENOUS
  Filled 2017-06-14: qty 1

## 2017-06-14 MED ORDER — MAGNESIUM SULFATE 2 GM/50ML IV SOLN
2.0000 g | Freq: Once | INTRAVENOUS | Status: AC
  Administered 2017-06-14: 2 g via INTRAVENOUS
  Filled 2017-06-14: qty 50

## 2017-06-14 MED ORDER — FOLIC ACID 1 MG PO TABS
1.0000 mg | ORAL_TABLET | Freq: Every day | ORAL | Status: DC
  Administered 2017-06-14 – 2017-06-18 (×5): 1 mg via ORAL
  Filled 2017-06-14 (×5): qty 1

## 2017-06-14 MED ORDER — PROCHLORPERAZINE EDISYLATE 5 MG/ML IJ SOLN
10.0000 mg | Freq: Once | INTRAMUSCULAR | Status: AC
  Administered 2017-06-14: 10 mg via INTRAVENOUS
  Filled 2017-06-14: qty 2

## 2017-06-14 MED ORDER — SODIUM CHLORIDE 0.9 % IV SOLN
2.0000 g | Freq: Once | INTRAVENOUS | Status: AC
  Administered 2017-06-14: 2 g via INTRAVENOUS
  Filled 2017-06-14: qty 2

## 2017-06-14 MED ORDER — DIPHENHYDRAMINE HCL 50 MG/ML IJ SOLN
25.0000 mg | Freq: Once | INTRAMUSCULAR | Status: AC
  Administered 2017-06-14: 25 mg via INTRAVENOUS
  Filled 2017-06-14: qty 1

## 2017-06-14 MED ORDER — IOPAMIDOL (ISOVUE-370) INJECTION 76%
INTRAVENOUS | Status: AC
  Administered 2017-06-14: 08:00:00
  Filled 2017-06-14: qty 100

## 2017-06-14 MED ORDER — VANCOMYCIN HCL 10 G IV SOLR
1500.0000 mg | Freq: Once | INTRAVENOUS | Status: AC
  Administered 2017-06-14: 1500 mg via INTRAVENOUS
  Filled 2017-06-14: qty 1500

## 2017-06-14 MED ORDER — SODIUM CHLORIDE 0.9 % IV BOLUS (SEPSIS)
1000.0000 mL | Freq: Once | INTRAVENOUS | Status: AC
  Administered 2017-06-14: 1000 mL via INTRAVENOUS

## 2017-06-14 MED ORDER — VANCOMYCIN HCL IN DEXTROSE 1-5 GM/200ML-% IV SOLN
1000.0000 mg | Freq: Once | INTRAVENOUS | Status: DC
  Filled 2017-06-14: qty 200

## 2017-06-14 MED ORDER — ACETAMINOPHEN 325 MG PO TABS
650.0000 mg | ORAL_TABLET | Freq: Four times a day (QID) | ORAL | Status: DC | PRN
  Administered 2017-06-14 – 2017-06-16 (×2): 650 mg via ORAL
  Filled 2017-06-14 (×2): qty 2

## 2017-06-14 MED ORDER — FLUCONAZOLE 100 MG PO TABS
200.0000 mg | ORAL_TABLET | Freq: Every day | ORAL | Status: DC
  Administered 2017-06-15 – 2017-06-16 (×2): 200 mg via ORAL
  Filled 2017-06-14 (×3): qty 2

## 2017-06-14 MED ORDER — VANCOMYCIN HCL IN DEXTROSE 1-5 GM/200ML-% IV SOLN
1000.0000 mg | Freq: Three times a day (TID) | INTRAVENOUS | Status: DC
  Administered 2017-06-14 – 2017-06-15 (×2): 1000 mg via INTRAVENOUS
  Filled 2017-06-14 (×3): qty 200

## 2017-06-14 MED ORDER — GABAPENTIN 100 MG PO CAPS
100.0000 mg | ORAL_CAPSULE | Freq: Three times a day (TID) | ORAL | Status: DC
  Administered 2017-06-14 – 2017-06-18 (×12): 100 mg via ORAL
  Filled 2017-06-14 (×12): qty 1

## 2017-06-14 MED ORDER — HALOPERIDOL LACTATE 5 MG/ML IJ SOLN: 3.0000 mg | Freq: Once | INTRAMUSCULAR | Status: DC

## 2017-06-14 MED ORDER — DARUN-COBIC-EMTRICIT-TENOFAF 800-150-200-10 MG PO TABS: 1.0000 | ORAL_TABLET | Freq: Every day | ORAL | Status: DC

## 2017-06-14 MED ORDER — ENOXAPARIN SODIUM 40 MG/0.4ML ~~LOC~~ SOLN
40.0000 mg | SUBCUTANEOUS | Status: DC
  Filled 2017-06-14 (×3): qty 0.4

## 2017-06-14 MED ORDER — ASPIRIN EC 81 MG PO TBEC
81.0000 mg | DELAYED_RELEASE_TABLET | Freq: Every day | ORAL | Status: DC
  Administered 2017-06-14 – 2017-06-18 (×5): 81 mg via ORAL
  Filled 2017-06-14 (×5): qty 1

## 2017-06-14 MED ORDER — SODIUM CHLORIDE 0.9% FLUSH
3.0000 mL | Freq: Two times a day (BID) | INTRAVENOUS | Status: DC
  Administered 2017-06-14 – 2017-06-17 (×7): 3 mL via INTRAVENOUS

## 2017-06-14 MED ORDER — VITAMIN B-1 100 MG PO TABS
100.0000 mg | ORAL_TABLET | Freq: Every day | ORAL | Status: DC
  Administered 2017-06-14 – 2017-06-18 (×5): 100 mg via ORAL
  Filled 2017-06-14 (×5): qty 1

## 2017-06-14 MED ORDER — IPRATROPIUM-ALBUTEROL 0.5-2.5 (3) MG/3ML IN SOLN
3.0000 mL | Freq: Once | RESPIRATORY_TRACT | Status: AC
  Administered 2017-06-14: 3 mL via RESPIRATORY_TRACT
  Filled 2017-06-14: qty 3

## 2017-06-14 MED ORDER — DOLUTEGRAVIR SODIUM 50 MG PO TABS
50.0000 mg | ORAL_TABLET | Freq: Every day | ORAL | Status: DC
  Administered 2017-06-15 – 2017-06-16 (×2): 50 mg via ORAL
  Filled 2017-06-14 (×2): qty 1

## 2017-06-14 MED ORDER — VALACYCLOVIR HCL 500 MG PO TABS
500.0000 mg | ORAL_TABLET | Freq: Every day | ORAL | Status: DC
  Administered 2017-06-15 – 2017-06-18 (×4): 500 mg via ORAL
  Filled 2017-06-14 (×5): qty 1

## 2017-06-14 NOTE — ED Notes (Signed)
Attempted report 

## 2017-06-14 NOTE — ED Triage Notes (Signed)
Pt reports chest pain, L shoulder pain, shob, neck pain, migraines, shortness of breath, bloody/yellow sputum since Wednesday. States "I got scared" prompting visit to ED today. Drinking coffee from [employer]. Hx HIV

## 2017-06-14 NOTE — Progress Notes (Signed)
Pharmacy Antibiotic Note  [name] is a 52 y.o. male admitted on 06/14/2017 with pneumonia.  Pharmacy has been consulted for cefepime and vancomycin dosing.  CXR shows airspace disease / consolidation most likely representing PNA. SCr 1.28, CrCl ~3380ml/min.  Plan: Give cefepime 2g IV x 1, then start cefepime 1g IV Q8h Give vancomycin 1,500mg  IV x 1, then start vancomycin 1g IV Q8h Monitor clinical picture, renal function, VT prn F/U C&S, abx deescalation / LOT   Height: 6\' 2"  (188 cm) Weight: 198 lb (89.8 kg) IBW/kg (Calculated) : 82.2  Temp (24hrs), Avg:98.2 F (36.8 C), Min:98.2 F (36.8 C), Max:98.2 F (36.8 C)  Recent Labs  Lab 06/14/17 0700 06/14/17 0803  WBC 17.5*  --   CREATININE 1.28*  --   LATICACIDVEN  --  1.22    Estimated Creatinine Clearance: 81.2 mL/min (A) (by C-G formula based on SCr of 1.28 mg/dL (H)).    Allergies  Allergen Reactions  . Sulfa Antibiotics Rash  . Tramadol Nausea Only   Thank you for allowing pharmacy to be a part of this patient's care.  Armandina StammerBATCHELDER,Aizah Gehlhausen J 06/14/2017 8:06 AM

## 2017-06-14 NOTE — ED Notes (Signed)
Per Dr. Elesa MassedWard- do not order protocol meds until seen by MD d/t EKG. No meds in triage for this reason.

## 2017-06-14 NOTE — H&P (Addendum)
Family Medicine Teaching [facility] Admission History and Physical Service Pager: [phone]  Patient name: [name] Medical record number: [id number] Date of birth: [birth year] Age: 52 y.o. Gender: male  Primary Care Provider: Patient, No Pcp Per Consultants: none Code Status: DNR, patient states if he codes, "it is his time to go," and he would want to go  Chief Complaint: chest pain, SOB  Assessment and Plan: [name] is a 52 y.o. male presenting with chest pain, shortness of breath. PMH is significant for HIV (last viral count 3/24 undetectable and CD4 count 280, follows with Dr. Daiva Eves), asthma, GERD, hx of PE, aseptic necrosis of R femur, OA of bilateral hips.   PNA, sepsis: s/p vanc, cefepime in the ED. [place] O2 given 2/2 tachypnea, no desaturations. Tachy on presentation, resolved after IVF. No lactic acidosis. Leukocytosis to 17.5. Blood cultures sent prior to antibiotics in ED. Less likely opportunistic infection given CD4 count and recent viral load. Low threshold to escalate antibiotics and/or consult pulm or ID if worsening. -admit to med surg, Dr. Deirdre Priest attending -continue vanc and cefepime per pharmacy -vitals per floor -regular diet -O2 PRN sats <89%  Chest pain: patient states chest pain for several days. Hx of early family MI in brother at 54. History atypical (worse with sitting, dull, at lung base), but given early family hx will do ACS workup. EKG on admission with [facility] in AVR, no prior to compare. -EKG in am and PRN chest pain -trend troponins  Hx of PE: initially complained of hemoptysis. -CTA negative -monitor clinically  HIV: last CD4 count was 280 on 06/07/17. Follows with [facility].  -no need to repeat viral load, done last week -continue home meds  Hypokalemia: K 2.9 on admit.  -kdur x2   AKI: baseline Cr normal, patient reports minimal p.o. intake over the last several days.  Supposed prerenal etiology at this point, workup if  unresolved after hydration. -monitor on daily BMP  Anemia: Hgb 11.7, appears to be at baseline.  Could consider iron studies, likely anemia of chronic disease.  -monitor on daily CBC  Lung nodules: 13mm RUL and RLL nodules, needs outpatient followup.  -outpatient CT chest in 3 months   Depression: patient states not on any meds at present.  -Monitor  Bilateral hip OA: has been using percocet for pain and rx ibuprofen.  -hold ibuprofen given AKI -tylenol for pain  GERD: has been using prilosec OTC.  -PPI while here  FEN/GI: regular diet, home PPI Prophylaxis: lovenox  Disposition: admit to med surg  History of Present Illness:  [name] is a 52 y.o. male presenting with chest pain, abdominal pain, hemoptysis.  States this all began on Wednesday, also endorses 2 isolated episodes of vomiting, nonbloody nonbilious.  No sick contacts, states this does not feel similar to his previous PE.  He reports at that time, he did not have any chest pain.  He also endorses bilateral hip pain.  States the chest pain is dull, it began in his shoulder.  Has been going on for several days, and is worse when lying down.  Additionally worse when sitting he reports.  He does not think it is associated with exertion.  Also endorse some left arm numbness.  He does have a brother who had an MI at age 23.  Also endorses chills over the last 5 days.  Has never had symptoms like this before.  Reports a dull abdominal pain for 2 days, last  bowel movement last night and normal.  [facility], states he does not have a pillbox and cannot recall the names of his medicines.  States he takes these from multiple bottles per day.  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  History of alcohol use last years ago.  States he smokes 1-2 cigarettes/week.  History of marijuana in the past, denies IV drug use.  Regarding CODE STATUS, he states he does not  want to be resuscitated in any fashion. He states when asked why he decided this that "when it is my time to go it is my time to go."  Review Of Systems: Per HPI with the following additions: Endorses chills, chest pain, shortness of breath, abdominal pain denies dysuria or changes in bowel movement.  ROS  Patient Active Problem List   Diagnosis Date Noted  . Lung nodule, multiple 06/14/2017  . Pneumonia 06/14/2017  . Asthma 04/22/2017  . History of Clostridium difficile 04/22/2017  . Infectious folliculitis 01/30/2017  . Folliculitis 01/30/2017  . Complication of implanted device 07/24/2015  . 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 GERD (gastroesophageal reflux disease) 01/25/2015  . Hypotension 01/25/2015  . HIV disease (HCC) 01/24/2015  . Primary osteoarthritis of both hips 03/29/2013  . History of pulmonary embolus (PE) 03/17/2013  . 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 HIV (human immunodeficiency virus infection) (HCC)   . MRSA (methicillin resistant staph aureus) culture positive     Past Surgical History: Past Surgical History:  Procedure Laterality Date  . hip arthroplasty Left 2016   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, I&D with replacement in 2017    Social History: Social History   Tobacco Use  . Smoking status: Never Smoker  . Smokeless tobacco: Current User    Types: Snuff  Substance Use Topics  . Alcohol use: Not on file  . Drug use: No   Additional social history: Lives with wife, in a hotel Please also refer to relevant sections of EMR.  Family History: History reviewed. No pertinent family history. Brother with MI at age 50  Allergies and Medications: Allergies  Allergen Reactions  . Sulfa Antibiotics Rash  . Tramadol Nausea Only   No current  facility-administered medications on file prior to encounter.    Current Outpatient Medications on File Prior to Encounter  Medication Sig Dispense Refill  . Darunavir-Cobicisctat-Emtricitabine-Tenofovir Alafenamide (SYMTUZA) 800-150-200-10 MG TABS Take 1 tablet by mouth daily with breakfast. 30 tablet 5  . dolutegravir (TIVICAY) 50 MG tablet Take 1 tablet (50 mg total) by mouth daily. 30 tablet 5  . fluconazole (DIFLUCAN) 200 MG tablet Take 1 tablet (200 mg total) by mouth daily. 7 tablet 5  . gabapentin (NEURONTIN) 100 MG capsule Take 1 capsule (100 mg total) by mouth 3 (three) times daily. 30 capsule 11  . ibuprofen (ADVIL,MOTRIN) 200 MG tablet Take 800 mg every 4 (four) hours as needed by mouth for mild pain.    Marland Kitchen. ibuprofen (ADVIL,MOTRIN) 600 MG tablet Take 1 tablet (600 mg total) by mouth every 6 (six) hours as needed. (Patient taking differently: Take 600 mg by mouth every 6 (six) hours as needed for moderate pain. ) 30 tablet 0  . oxyCODONE-acetaminophen (PERCOCET/ROXICET) 5-325 MG tablet Take 1 tablet by mouth  every 6 (six) hours as needed for severe pain. 15 tablet 0  . valACYclovir (VALTREX) 500 MG tablet Take 1 tablet (500 mg total) by mouth daily. 30 tablet 11    Objective: BP 122/75   Pulse 94   Temp 97.7 F (36.5 C) (Oral)   Resp (!) 24   Ht 6\' 2"  (1.88 m)   Wt 198 lb (89.8 kg)   SpO2 99%   BMI 25.42 kg/m  Exam: General: Appears stated age, sitting up in bed in no acute distress mild tachypnea Eyes: EOMI, PERRLA RLA ENTM: Dry mucous membranes Neck: Supple, normal range of motion Cardiovascular: Regular rate and rhythm, no murmur Respiratory: Decreased breath sounds in left base, mild tachypnea without increased work of breathing, no crackles Gastrointestinal: Soft, nontender, nondistended, positive bowel sounds MSK: Normal muscle bulk for age Derm: No rashes over exposed skin Neuro: Cranial nerves grossly intact, AO x3 Psych: Mood and affect appropriate  Labs and  Imaging: CBC BMET  Recent Labs  Lab 06/14/17 0700  WBC 17.5*  HGB 11.7*  HCT 33.9*  PLT 170   Recent Labs  Lab 06/14/17 0700  NA 137  K 2.9*  CL 110  CO2 17*  BUN 17  CREATININE 1.28*  GLUCOSE 100*  CALCIUM 8.6*     Dg Chest 2 View  Result Date: 06/14/2017 CLINICAL DATA:  Cough, chest pain and shortness of breath for 4 days. EXAM: CHEST - 2 VIEW COMPARISON:  09/19/2009 and prior chest radiographs FINDINGS: Airspace disease/consolidation involving much of the LEFT LOWER lobe identified likely representing pneumonia. Mild ill-defined opacities within the mid and lower RIGHT lung may represent developing airspace disease/pneumonia. Cardiomediastinal silhouette is otherwise unchanged. There may be a trace LEFT pleural effusion present. There is no evidence of pneumothorax or acute bony abnormality. IMPRESSION: Airspace disease/consolidation involving the majority of the LEFT LOWER lobe likely representing pneumonia. Mild opacities within the mid and lower RIGHT lung which may represent developing airspace disease/pneumonia. Radiographic follow-up to resolution is recommended. Electronically Signed   By: Harmon Pier M.D.   On: 06/14/2017 07:42   Ct Angio Chest Pe W And/or Wo Contrast  Result Date: 06/14/2017 CLINICAL DATA:  Acute generalized abdominal pain EXAM: CT ANGIOGRAPHY CHEST CT ABDOMEN AND PELVIS WITH CONTRAST TECHNIQUE: Multidetector CT imaging of the chest was performed using the standard protocol during bolus administration of intravenous contrast. Multiplanar CT image reconstructions and MIPs were obtained to evaluate the vascular anatomy. Multidetector CT imaging of the abdomen and pelvis was performed using the standard protocol during bolus administration of intravenous contrast. CONTRAST:  ISOVUE-370 IOPAMIDOL (ISOVUE-370) INJECTION 76% COMPARISON:  None. FINDINGS: CTA CHEST FINDINGS Cardiovascular: Satisfactory opacification of the pulmonary arteries to the segmental level. No  evidence of pulmonary embolism. Normal heart size. No pericardial effusion. Mediastinum/Nodes: Enlarged subcarinal lymph node measuring 13 mm. Mild left hilar lymphadenopathy likely reactive. No axillary lymphadenopathy. Normal trachea and esophagus. Normal thyroid gland. Lungs/Pleura: Dense left lower lobe consolidation. 13 mm right lower lobe pulmonary nodule. 13 mm right upper lobe pulmonary nodule. Hazy airspace disease in the right upper lobe. Small left pleural effusion. No pneumothorax. Musculoskeletal: No acute osseous abnormality. No aggressive osseous lesion. Review of the MIP images confirms the above findings. CT ABDOMEN and PELVIS FINDINGS Hepatobiliary: No focal liver abnormality is seen. No gallstones, gallbladder wall thickening, or biliary dilatation. Pancreas: Unremarkable. No pancreatic ductal dilatation or surrounding inflammatory changes. Spleen: Multiple calcifications in the spleen as can be seen with prior granulomatous disease. Adrenals/Urinary Tract:  Adrenal glands are unremarkable. No urolithiasis or obstructive uropathy. 18 mm hypodense, fluid attenuating right interpolar renal mass most consistent with a cyst. Bladder is unremarkable. Stomach/Bowel: Small hiatal hernia. Stomach is otherwise within normal limits. No evidence of bowel wall thickening, distention, or inflammatory changes. Vascular/Lymphatic: Normal caliber abdominal aorta. Bilateral periaortic calcified lymph node as can be seen with prior granulomatous disease. Reproductive: Prostate is unremarkable. Other: No abdominal wall hernia or abnormality. No abdominopelvic ascites. Musculoskeletal: No aggressive osseous lesion. Left total hip arthroplasty. No acute osseous abnormality. Severe osteoarthritis of the right hip. Review of the MIP images confirms the above findings. IMPRESSION: 1. No pulmonary embolus. 2. Dense left lower lobe pneumonia. 3. Mild hazy right upper lobe airspace disease concerning for pneumonia. 4. 13 mm  right upper lobe and right lower lobe pulmonary nodules which may be secondary to an inflammatory or neoplastic etiology. Recommend follow-up CT of the chest in 3 months. Electronically Signed   By: Elige Ko   On: 06/14/2017 12:41   Ct Pelvis Wo Contrast  Result Date: 06/07/2017 CLINICAL DATA:  52 year old male with right hip pain. EXAM: CT PELVIS WITHOUT CONTRAST TECHNIQUE: Multidetector CT imaging of the pelvis was performed following the standard protocol without intravenous contrast. COMPARISON:  Pelvic radiograph dated 04/13/2017 and CT of the abdomen pelvis dated 10/21/2013. FINDINGS: Urinary Tract: The visualized distal ureters and urinary bladder appear unremarkable. Bowel: No bowel dilatation or active inflammation within the pelvis. Vascular/Lymphatic: The visualized vasculature appear unremarkable. A top-normal left external iliac chain lymph node measures 1 cm in short axis, of indeterminate clinical significance, likely reactive. Reproductive: The prostate and seminal vesicles are grossly unremarkable. Other:  None Musculoskeletal: There is a total left arthroplasty. The arthroplasty components appear intact and in anatomic alignment. There is heterotopic bone formation adjacent to the left greater trochanter. The appearance of the arthroplasty and acetabular screws are similar to the prior CT. There is osteopenia of the left hip primarily adjacent to the acetabular cup, increased since the prior CT. There is severe osteoarthritis of the right hip with slight flattening of the right femoral head and narrowing of the joint space similar or slightly progressed compared to the CT of 2015. There is no acute fracture. No dislocation. IMPRESSION: Severe osteoarthritic changes of the right hip similar or slightly progressed compared to the prior CT. Mild flattening and fragmentation of the right femoral head may be sequela of underlying avascular necrosis. Left hip arthroplasty appears intact.  Progression of osteopenia surrounding the acetabular cup of the left hip. No acute fracture. Electronically Signed   By: Elgie Collard M.D.   On: 06/07/2017 22:29   Ct Abdomen Pelvis W Contrast  Result Date: 06/14/2017 CLINICAL DATA:  Acute generalized abdominal pain EXAM: CT ANGIOGRAPHY CHEST CT ABDOMEN AND PELVIS WITH CONTRAST TECHNIQUE: Multidetector CT imaging of the chest was performed using the standard protocol during bolus administration of intravenous contrast. Multiplanar CT image reconstructions and MIPs were obtained to evaluate the vascular anatomy. Multidetector CT imaging of the abdomen and pelvis was performed using the standard protocol during bolus administration of intravenous contrast. CONTRAST:  ISOVUE-370 IOPAMIDOL (ISOVUE-370) INJECTION 76% COMPARISON:  None. FINDINGS: CTA CHEST FINDINGS Cardiovascular: Satisfactory opacification of the pulmonary arteries to the segmental level. No evidence of pulmonary embolism. Normal heart size. No pericardial effusion. Mediastinum/Nodes: Enlarged subcarinal lymph node measuring 13 mm. Mild left hilar lymphadenopathy likely reactive. No axillary lymphadenopathy. Normal trachea and esophagus. Normal thyroid gland. Lungs/Pleura: Dense left lower lobe consolidation. 13 mm  right lower lobe pulmonary nodule. 13 mm right upper lobe pulmonary nodule. Hazy airspace disease in the right upper lobe. Small left pleural effusion. No pneumothorax. Musculoskeletal: No acute osseous abnormality. No aggressive osseous lesion. Review of the MIP images confirms the above findings. CT ABDOMEN and PELVIS FINDINGS Hepatobiliary: No focal liver abnormality is seen. No gallstones, gallbladder wall thickening, or biliary dilatation. Pancreas: Unremarkable. No pancreatic ductal dilatation or surrounding inflammatory changes. Spleen: Multiple calcifications in the spleen as can be seen with prior granulomatous disease. Adrenals/Urinary Tract: Adrenal glands are unremarkable.  No urolithiasis or obstructive uropathy. 18 mm hypodense, fluid attenuating right interpolar renal mass most consistent with a cyst. Bladder is unremarkable. Stomach/Bowel: Small hiatal hernia. Stomach is otherwise within normal limits. No evidence of bowel wall thickening, distention, or inflammatory changes. Vascular/Lymphatic: Normal caliber abdominal aorta. Bilateral periaortic calcified lymph node as can be seen with prior granulomatous disease. Reproductive: Prostate is unremarkable. Other: No abdominal wall hernia or abnormality. No abdominopelvic ascites. Musculoskeletal: No aggressive osseous lesion. Left total hip arthroplasty. No acute osseous abnormality. Severe osteoarthritis of the right hip. Review of the MIP images confirms the above findings. IMPRESSION: 1. No pulmonary embolus. 2. Dense left lower lobe pneumonia. 3. Mild hazy right upper lobe airspace disease concerning for pneumonia. 4. 13 mm right upper lobe and right lower lobe pulmonary nodules which may be secondary to an inflammatory or neoplastic etiology. Recommend follow-up CT of the chest in 3 months. Electronically Signed   By: Elige Ko   On: 06/14/2017 12:41   Dg Knee Complete 4 Views Right  Result Date: 06/07/2017 CLINICAL DATA:  Knee pain EXAM: RIGHT KNEE - COMPLETE 4+ VIEW COMPARISON:  None. FINDINGS: No evidence of fracture, dislocation, or joint effusion. No evidence of arthropathy or other focal bone abnormality. Soft tissues are unremarkable. IMPRESSION: Negative. Electronically Signed   By: Kennith Center M.D.   On: 06/07/2017 22:38   Dg Femur, Min 2 Views Right  Result Date: 06/07/2017 CLINICAL DATA:  Pain. EXAM: RIGHT FEMUR 2 VIEWS COMPARISON:  None. FINDINGS: Marked loss of joint space with subchondral sclerosis and subchondral cyst formation noted in the acetabulum and femoral head. Bones are demineralized. No evidence for femur fracture. No suspicious lytic or sclerotic osseous abnormality within the femur.  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 Electronically Signed   By: Kennith Center M.D.   On: 06/07/2017 20:35     Garth Bigness, MD 06/14/2017, 12:56 PM PGY-2, [facility] FPTS Intern pager: [phone], text pages welcome

## 2017-06-14 NOTE — ED Provider Notes (Signed)
[facility] Provider Note   CSN: [id number] Arrival date & time: 06/14/17  0636     History   Chief Complaint Chief Complaint  Patient presents with  . multiple complaints    cp, shob, emesis, bloody sputum, shoulder pain, neck pains, migraines    HPI Lesle ReekLarry D Shiel is a 52 y.o. male.  HPI   Wednesday began to have left shoulder pain, then after 20min spread from chest.  Shortness of breath started Thursday.  Cough since Thursday, coughing up yellow, white and bloody sputum.  Blood mixed with mucus.  Fever thurs Friday and Saturday, subjective. Pain radiates to back. Also notes migraines.  No dysuria. No leg pain or swelling.  Reports hx of PE, was briefly on anticoagulation but no longer.    Past Medical History:  Diagnosis Date  . HIV (human immunodeficiency virus infection) (HCC)   . MRSA (methicillin resistant staph aureus) culture positive     Patient Active Problem List   Diagnosis Date Noted  . Asthma 04/22/2017  . History of Clostridium difficile 04/22/2017  . Infectious folliculitis 01/30/2017  . Folliculitis 01/30/2017  . Complication of implanted device 07/24/2015  . 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 GERD (gastroesophageal reflux disease) 01/25/2015  . Hypotension 01/25/2015  . HIV disease (HCC) 01/24/2015  . Primary osteoarthritis of both hips 03/29/2013  . History of pulmonary embolus (PE) 03/17/2013  . 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 Authorizing Provider  Darunavir-Cobicisctat-Emtricitabine-Tenofovir Alafenamide  [facility](SYMTUZA) 800-150-200-10 MG TABS Take 1 tablet by mouth daily with breakfast. 05/04/17   Daiva EvesVan Dam, Lisette Grinderornelius N, MD  dolutegravir (TIVICAY) 50 MG tablet Take 1 tablet (50 mg total) by mouth daily. 05/04/17   Randall HissVan Dam, Cornelius N, MD  fluconazole (DIFLUCAN) 200 MG tablet Take 1 tablet (200 mg total) by mouth daily. 05/04/17   Randall HissVan Dam, Cornelius N, MD  gabapentin (NEURONTIN) 100 MG capsule Take 1 capsule (100 mg total) by mouth 3 (three) times daily. Patient taking differently: Take 100 mg by mouth 2 (two) times daily with breakfast and lunch.  05/04/17   Randall HissVan Dam, Cornelius N, MD  ibuprofen (ADVIL,MOTRIN) 200 MG tablet Take 800 mg every 4 (four) hours as needed by mouth for mild pain.    [provider]  ibuprofen (ADVIL,MOTRIN) 600 MG tablet Take 1 tablet (600 mg total) by mouth every 6 (six) hours as needed. 06/07/17   Bethel BornGekas, Kelly Marie, PA-C  oxyCODONE-acetaminophen (PERCOCET/ROXICET) 5-325 MG tablet Take 1 tablet by mouth every 6 (six) hours as needed for severe pain. 06/07/17   Bethel BornGekas, Kelly Marie, PA-C  valACYclovir (VALTREX) 500 MG tablet Take 1 tablet (500 mg total) by mouth daily. 05/04/17   Randall HissVan Dam, Cornelius N, MD    Family History History reviewed. No pertinent family history.  Social History Social History   Tobacco Use  . Smoking status: Never Smoker  . Smokeless tobacco: Current User    Types: Snuff  Substance Use Topics  . Alcohol use: Not on file  . Drug use: No     Allergies   Sulfa  antibiotics and Tramadol   Review of Systems Review of Systems  Constitutional: Positive for fatigue and fever.  HENT: Negative for sore throat.   Eyes: Negative for visual disturbance.  Respiratory: Positive for cough and shortness of breath.   Cardiovascular: Positive for chest pain.  Gastrointestinal: Positive for abdominal pain, nausea and vomiting.  Genitourinary: Negative for difficulty urinating.  Musculoskeletal: Positive for arthralgias, back pain and neck pain. Negative  for neck stiffness.  Skin: Negative for rash.  Neurological: Positive for headaches. Negative for syncope.     Physical Exam Updated Vital Signs BP 109/66   Pulse 94   Temp 98.2 F (36.8 C) (Oral)   Resp (!) 24   Ht 6\' 2"  (1.88 m)   Wt 89.8 kg (198 lb)   SpO2 97%   BMI 25.42 kg/m   Physical Exam  Constitutional: He is oriented to person, place, and time. He appears well-developed and well-nourished. He appears distressed.  HENT:  Head: Normocephalic and atraumatic.  Eyes: Conjunctivae and EOM are normal.  Neck: Normal range of motion.  Cardiovascular: Normal rate, regular rhythm, normal heart sounds and intact distal pulses. Exam reveals no gallop and no friction rub.  No murmur heard. Pulmonary/Chest: Effort normal and breath sounds normal. Tachypnea noted. No respiratory distress. He has no wheezes. He has no rales.  Abdominal: He exhibits no distension. There is tenderness (diffuse). There is guarding.  Musculoskeletal: He exhibits no edema.  Neurological: He is alert and oriented to person, place, and time.  Skin: Skin is warm and dry. He is not diaphoretic.  Nursing note and vitals reviewed.    ED Treatments / Results  Labs (all labs ordered are listed, but only abnormal results are displayed) Labs Reviewed  BASIC METABOLIC PANEL - Abnormal; Notable for the following components:      Result Value   Potassium 2.9 (*)    CO2 17 (*)    Glucose, Bld 100 (*)    Creatinine, Ser 1.28 (*)    Calcium 8.6 (*)    All other components within normal limits  CBC - Abnormal; Notable for the following components:   WBC 17.5 (*)    Hemoglobin 11.7 (*)    HCT 33.9 (*)    MCV 76.7 (*)    RDW 15.6 (*)    All other components within normal limits  CULTURE, BLOOD (ROUTINE X 2)  CULTURE, BLOOD (ROUTINE X 2)  PROTIME-INR  APTT  HEPATIC FUNCTION PANEL  DIFFERENTIAL  LIPASE, BLOOD  I-STAT TROPONIN, ED  I-STAT CG4 LACTIC ACID, ED    EKG EKG  Interpretation  Date/Time:  Sunday June 14 2017 06:44:30 EDT Ventricular Rate:  103 PR Interval:  136 QRS Duration: 96 QT Interval:  434 QTC Calculation: 568 R Axis:   58 Text Interpretation:  Sinus tachycardia Prolonged QT Abnormal ECG No previous ECGs available Confirmed by Alvira Monday ([id number]) on 06/14/2017 7:10:57 AM   Radiology Dg Chest 2 View  Result Date: 06/14/2017 CLINICAL DATA:  Cough, chest pain and shortness of breath for 4 days. EXAM: CHEST - 2 VIEW COMPARISON:  09/19/2009 and prior chest radiographs FINDINGS: Airspace disease/consolidation involving much of the LEFT LOWER lobe identified likely representing pneumonia. Mild ill-defined opacities within the mid and lower RIGHT lung may represent developing airspace disease/pneumonia. Cardiomediastinal silhouette is otherwise unchanged. There may be a trace LEFT pleural effusion present. There is no evidence of pneumothorax or acute bony abnormality. IMPRESSION: Airspace disease/consolidation involving the majority of the LEFT LOWER lobe likely  representing pneumonia. Mild opacities within the mid and lower RIGHT lung which may represent developing airspace disease/pneumonia. Radiographic follow-up to resolution is recommended. Electronically Signed   By: Harmon Pier M.D.   On: 06/14/2017 07:42    Procedures .Critical Care Performed by: Alvira Monday, MD Authorized by: Alvira Monday, MD   Critical care provider statement:    Critical care time (minutes):  30   Critical care was necessary to treat or prevent imminent or life-threatening deterioration of the following conditions:  Sepsis   Critical care was time spent personally by me on the following activities:  Evaluation of patient's response to treatment, obtaining history from patient or surrogate, development of treatment plan with patient or surrogate, ordering and review of laboratory studies, ordering and review of radiographic studies, pulse oximetry,  re-evaluation of patient's condition and review of old charts   (including critical care time)  Medications Ordered in ED Medications  sodium chloride 0.9 % bolus 1,000 mL (1,000 mLs Intravenous New Bag/Given 06/14/17 0813)    And  sodium chloride 0.9 % bolus 1,000 mL (1,000 mLs Intravenous New Bag/Given 06/14/17 0816)    And  sodium chloride 0.9 % bolus 1,000 mL (has no administration in time range)  ceFEPIme (MAXIPIME) 2 g in sodium chloride 0.9 % 100 mL IVPB (has no administration in time range)  methocarbamol (ROBAXIN) injection 1,000 mg (has no administration in time range)  iopamidol (ISOVUE-370) 76 % injection (has no administration in time range)  vancomycin (VANCOCIN) 1,500 mg in sodium chloride 0.9 % 500 mL IVPB (has no administration in time range)  ceFEPIme (MAXIPIME) 1 g in sodium chloride 0.9 % 100 mL IVPB (has no administration in time range)  vancomycin (VANCOCIN) IVPB 1000 mg/200 mL premix (has no administration in time range)  prochlorperazine (COMPAZINE) injection 10 mg (has no administration in time range)  diphenhydrAMINE (BENADRYL) injection 25 mg (has no administration in time range)  magnesium sulfate IVPB 2 g 50 mL (has no administration in time range)  potassium chloride SA (K-DUR,KLOR-CON) CR tablet 40 mEq (40 mEq Oral Given 06/14/17 0806)     Initial Impression / Assessment and Plan / ED Course  I have reviewed the triage vital signs and the nursing notes.  Pertinent labs & imaging results that were available during my care of the patient were reviewed by me and considered in my medical decision making (see chart for details).    52 year old male with a history of HIV, last CD4 count 280, history of pulmonary embolus no longer on anticoagulation presents with multiple concerns including cough productive of bloody sputum, shortness of breath and chest pain radiating to the back.  Patient also reports headache or migraine.  He was given headache cocktail.  Chest  x-ray shows left lower lobe pneumonia.  Patient is tachypneic, with leukocytosis, concerning for sepsis.  Lactic acid is within normal limits.  He was given 30 cc/kg of normal saline, vancomycin and cefepime.  Given patient's history of pulmonary embolus and hemoptysis, did order CT angio to evaluate for underlying PE or pulmonary infarct and given patient with severe abdominal tenderness on exam ordered CT abdomen.  CT shows pneumonia without other acute abnormalities.   Will admit for care of pneumonia, sepsis in setting of immunocompromise.  Final Clinical Impressions(s) / ED Diagnoses   Final diagnoses:  Sepsis, due to unspecified organism [facility])  Pneumonia of left lower lobe due to infectious organism [facility])    ED Discharge Orders    None  Alvira Monday, MD 06/14/17 2136

## 2017-06-14 NOTE — ED Notes (Signed)
ED Provider at bedside. 

## 2017-06-15 DIAGNOSIS — R7881 Bacteremia: Secondary | ICD-10-CM

## 2017-06-15 LAB — BLOOD CULTURE ID PANEL (REFLEXED)
Acinetobacter baumannii: NOT DETECTED
CANDIDA TROPICALIS: NOT DETECTED
Candida albicans: NOT DETECTED
Candida glabrata: NOT DETECTED
Candida krusei: NOT DETECTED
Candida parapsilosis: NOT DETECTED
ENTEROBACTERIACEAE SPECIES: NOT DETECTED
Enterobacter cloacae complex: NOT DETECTED
Enterococcus species: NOT DETECTED
Escherichia coli: NOT DETECTED
HAEMOPHILUS INFLUENZAE: NOT DETECTED
KLEBSIELLA PNEUMONIAE: NOT DETECTED
Klebsiella oxytoca: NOT DETECTED
Listeria monocytogenes: NOT DETECTED
NEISSERIA MENINGITIDIS: NOT DETECTED
PROTEUS SPECIES: NOT DETECTED
PSEUDOMONAS AERUGINOSA: NOT DETECTED
STAPHYLOCOCCUS SPECIES: NOT DETECTED
STREPTOCOCCUS AGALACTIAE: NOT DETECTED
STREPTOCOCCUS SPECIES: DETECTED — AB
Serratia marcescens: NOT DETECTED
Staphylococcus aureus (BCID): NOT DETECTED
Streptococcus pneumoniae: DETECTED — AB
Streptococcus pyogenes: NOT DETECTED

## 2017-06-15 LAB — CBC
HEMATOCRIT: 29.8 % — AB (ref 39.0–52.0)
Hemoglobin: 10.1 g/dL — ABNORMAL LOW (ref 13.0–17.0)
MCH: 25.8 pg — ABNORMAL LOW (ref 26.0–34.0)
MCHC: 33.9 g/dL (ref 30.0–36.0)
MCV: 76 fL — AB (ref 78.0–100.0)
PLATELETS: 166 10*3/uL (ref 150–400)
RBC: 3.92 MIL/uL — ABNORMAL LOW (ref 4.22–5.81)
RDW: 16 % — AB (ref 11.5–15.5)
WBC: 11.6 10*3/uL — ABNORMAL HIGH (ref 4.0–10.5)

## 2017-06-15 LAB — COMPREHENSIVE METABOLIC PANEL
ALBUMIN: 2.2 g/dL — AB (ref 3.5–5.0)
ALT: 30 U/L (ref 17–63)
AST: 25 U/L (ref 15–41)
Alkaline Phosphatase: 95 U/L (ref 38–126)
Anion gap: 8 (ref 5–15)
BILIRUBIN TOTAL: 1.1 mg/dL (ref 0.3–1.2)
BUN: 16 mg/dL (ref 6–20)
CO2: 18 mmol/L — ABNORMAL LOW (ref 22–32)
Calcium: 8.4 mg/dL — ABNORMAL LOW (ref 8.9–10.3)
Chloride: 114 mmol/L — ABNORMAL HIGH (ref 101–111)
Creatinine, Ser: 1.26 mg/dL — ABNORMAL HIGH (ref 0.61–1.24)
GFR calc Af Amer: >60 mL/min (ref >60)
GFR calc non Af Amer: >60 mL/min (ref >60)
Glucose, Bld: 92 mg/dL (ref 65–99)
POTASSIUM: 2.5 mmol/L — AB (ref 3.5–5.1)
Sodium: 140 mmol/L (ref 135–145)
TOTAL PROTEIN: 6.1 g/dL — AB (ref 6.5–8.1)

## 2017-06-15 LAB — TROPONIN I
TROPONIN I: 0.05 ng/mL — AB (ref <0.03)
Troponin I: <0.03 ng/mL (ref <0.03)

## 2017-06-15 LAB — POTASSIUM: Potassium: 2.7 mmol/L — CL (ref 3.5–5.1)

## 2017-06-15 MED ORDER — POTASSIUM CHLORIDE CRYS ER 20 MEQ PO TBCR: 40.0000 meq | EXTENDED_RELEASE_TABLET | Freq: Two times a day (BID) | ORAL | Status: DC

## 2017-06-15 MED ORDER — SODIUM CHLORIDE 0.9 % IV SOLN
2.0000 g | INTRAVENOUS | Status: AC
  Administered 2017-06-15: 2 g via INTRAVENOUS
  Filled 2017-06-15: qty 20

## 2017-06-15 MED ORDER — DM-GUAIFENESIN ER 30-600 MG PO TB12
1.0000 | ORAL_TABLET | Freq: Two times a day (BID) | ORAL | Status: DC
  Administered 2017-06-15 – 2017-06-18 (×7): 1 via ORAL
  Filled 2017-06-15 (×7): qty 1

## 2017-06-15 MED ORDER — BENZONATATE 100 MG PO CAPS
200.0000 mg | ORAL_CAPSULE | Freq: Three times a day (TID) | ORAL | Status: DC | PRN
  Administered 2017-06-15: 200 mg via ORAL
  Filled 2017-06-15: qty 2

## 2017-06-15 MED ORDER — POTASSIUM CHLORIDE CRYS ER 20 MEQ PO TBCR
40.0000 meq | EXTENDED_RELEASE_TABLET | ORAL | Status: AC
  Administered 2017-06-15 (×2): 40 meq via ORAL
  Filled 2017-06-15 (×2): qty 2

## 2017-06-15 NOTE — Progress Notes (Signed)
Paged Family Medicine about inhaler to "open him up" which they had discussed.  Family Medicine stated they were not giving albuterol inhaler at this time.  Stated they would look into it and maybe prescribe something for cough later.  Mucinex was given.

## 2017-06-15 NOTE — Progress Notes (Signed)
Criticl KCL 2.7 Paged Family Medicine FYI.

## 2017-06-15 NOTE — Discharge Summary (Addendum)
[facility] Discharge Summary  Patient name: [name] Medical record number: [id number] Date of birth: [birth year] Age: 52 y.o. Gender: male Date of Admission: 06/14/2017  Date of Discharge: 06/18/17 Admitting Physician: Lind Covert, MD  Primary Care Provider: Patient, No Pcp Per Consultants: Infectious Disease  Indication for Hospitalization: Pneumonia  Discharge Diagnoses/Problem List:   Diagnosis  Lung nodule, multiple  Pneumonia  Sepsis ([place])  Hypokalemia  Asthma  Severe episode of recurrent major depressive disorder, without psychotic features ([place])  GERD (gastroesophageal reflux disease)  Hypotension  HIV (human immunodeficiency virus infection) ([place])  Primary osteoarthritis of both hips  History of pulmonary embolus ([place])  Idiopathic aseptic necrosis of right femur ([place])  Pneumococcal Bacteremia    Disposition: Home  Discharge Condition: Improved  Discharge Exam:  General: NAD, lying in bed CVS: RRR, no MRG Lungs: diffuse crackles in LLL, no wheezes. Normal WOB at rest. Some rib pain on palpation Abdomen: Soft, nontender, nondistended MSK: No lower extremity edema, 2+ dp  Brief Hospital Course:  Pneumococcal Pneumonia and Bacteremia [name] is a 52 y.o. male who presents with Shortness of breath and chest pain. In the ED was placed on due to tachypnia. CXR showed LLL PNA. CT Chest showed similar presentation of LLL and had incidental findings of 80m RUL and RLL nodules.  WBC on admit was 17. Patient met sepsis criteria with SIRS 2/4 with known source. Patient was initially placed on 1 L [place] due to tachypnea, but was later discontinued due to maintaining adequate O2 Saturation. Patient was placed on IV vancomycin and cefepime for 1 day. Blood cultures grew Strept pneumoniae. Antibiotics were narrowed to CTX for 1 dose. Patient tolerated this well and was transitioned to oral cefdinir for 1 day. ID was consulted as  partient has HIV and follow with them. ID recommended switching to levaquin for better tissue penetration. and to obtain and echocardiogram to rule out valvular pathology. Patient had Levaquin 750 mg for 2 days.  Just prior to discharge, consult with pharmacy and ID made allowance to switch antibiotic to Augmentin complete a 2 week course. Echocardiogram showed normal EF fuction without abnormality.WBC trended down to 11 and remained stable upon discharge. Upon discharge, patient was SORA, pain was well controlled on oral medications, and patient was able to take a shower without assistance.    HIV Patient recent CD4 Count was 280. Home antiretrovirals were continued inpatient.   Issues for Follow Up:  1. Lung Nodules - 144mRUL and RLL nodules, needs CT chest in 3 months 2. Patient has history of asthma without controller medication. Patient WOB improved with albuterol nebulizer. Patient was discharged with albuterol rescue inhaler for prn use. Consider putting patient on controller medication for asthma.  3. Patient has baseline normocytic anemia of Hg 10. Consider iron studies after resolution of Pneumonia  Significant Procedures: None  Significant Labs and Imaging:  Recent Labs  Lab 06/16/17 0731 06/17/17 0702 06/18/17 0613  WBC 11.0* 11.4* 10.9*  HGB 9.8* 10.0* 10.0*  HCT 29.0* 29.2* 29.9*  PLT 156 155 166   Recent Labs  Lab 06/14/17 0700 06/15/17 0746  06/16/17 0731 06/16/17 1410 06/16/17 1842 06/17/17 0702 06/17/17 0730 06/18/17 0613  NA 137 140  --  138 135 135 137  --  135  K 2.9* 2.5*   < > 2.5* 2.8* 2.8* 3.4*  --  3.6  CL 110 114*  --  112* 110 110 112*  --  108  CO2 17* 18*  --  20* 18* 17* 17*  --  20*  GLUCOSE 100* 92  --  109* 117* 130* 120*  --  104*  BUN 17 16  --  '16 15 16 13  ' --  14  CREATININE 1.28* 1.26*  --  1.30* 1.21 1.36* 1.05  --  0.99  CALCIUM 8.6* 8.4*  --  8.3* 8.1* 8.2* 8.3*  --  8.4*  MG  --   --   --  1.9  --   --   --  1.7  --   ALKPHOS 105  95  --   --   --   --   --   --   --   AST 28 25  --   --   --   --   --   --   --   ALT 27 30  --   --   --   --   --   --   --   ALBUMIN 2.9* 2.2*  --   --   --   --   --   --   --    < > = values in this interval not displayed.    Dg Chest 2 View  Result Date: 06/14/2017 CLINICAL DATA:  Cough, chest pain and shortness of breath for 4 days. EXAM: CHEST - 2 VIEW COMPARISON:  09/19/2009 and prior chest radiographs FINDINGS: Airspace disease/consolidation involving much of the LEFT LOWER lobe identified likely representing pneumonia. Mild ill-defined opacities within the mid and lower RIGHT lung may represent developing airspace disease/pneumonia. Cardiomediastinal silhouette is otherwise unchanged. There may be a trace LEFT pleural effusion present. There is no evidence of pneumothorax or acute bony abnormality. IMPRESSION: Airspace disease/consolidation involving the majority of the LEFT LOWER lobe likely representing pneumonia. Mild opacities within the mid and lower RIGHT lung which may represent developing airspace disease/pneumonia. Radiographic follow-up to resolution is recommended. Electronically Signed   By: Margarette Canada M.D.   On: 06/14/2017 07:42   Ct Angio Chest Pe W And/or Wo Contrast  Result Date: 06/14/2017 CLINICAL DATA:  Acute generalized abdominal pain EXAM: CT ANGIOGRAPHY CHEST CT ABDOMEN AND PELVIS WITH CONTRAST TECHNIQUE: Multidetector CT imaging of the chest was performed using the standard protocol during bolus administration of intravenous contrast. Multiplanar CT image reconstructions and MIPs were obtained to evaluate the vascular anatomy. Multidetector CT imaging of the abdomen and pelvis was performed using the standard protocol during bolus administration of intravenous contrast. CONTRAST:  ISOVUE-370 IOPAMIDOL (ISOVUE-370) INJECTION 76% COMPARISON:  None. FINDINGS: CTA CHEST FINDINGS Cardiovascular: Satisfactory opacification of the pulmonary arteries to the segmental level.  No evidence of pulmonary embolism. Normal heart size. No pericardial effusion. Mediastinum/Nodes: Enlarged subcarinal lymph node measuring 13 mm. Mild left hilar lymphadenopathy likely reactive. No axillary lymphadenopathy. Normal trachea and esophagus. Normal thyroid gland. Lungs/Pleura: Dense left lower lobe consolidation. 13 mm right lower lobe pulmonary nodule. 13 mm right upper lobe pulmonary nodule. Hazy airspace disease in the right upper lobe. Small left pleural effusion. No pneumothorax. Musculoskeletal: No acute osseous abnormality. No aggressive osseous lesion. Review of the MIP images confirms the above findings. CT ABDOMEN and PELVIS FINDINGS Hepatobiliary: No focal liver abnormality is seen. No gallstones, gallbladder wall thickening, or biliary dilatation. Pancreas: Unremarkable. No pancreatic ductal dilatation or surrounding inflammatory changes. Spleen: Multiple calcifications in the spleen as can be seen with prior granulomatous disease. Adrenals/Urinary Tract: Adrenal glands are unremarkable. No urolithiasis or obstructive uropathy.  18 mm hypodense, fluid attenuating right interpolar renal mass most consistent with a cyst. Bladder is unremarkable. Stomach/Bowel: Small hiatal hernia. Stomach is otherwise within normal limits. No evidence of bowel wall thickening, distention, or inflammatory changes. Vascular/Lymphatic: Normal caliber abdominal aorta. Bilateral periaortic calcified lymph node as can be seen with prior granulomatous disease. Reproductive: Prostate is unremarkable. Other: No abdominal wall hernia or abnormality. No abdominopelvic ascites. Musculoskeletal: No aggressive osseous lesion. Left total hip arthroplasty. No acute osseous abnormality. Severe osteoarthritis of the right hip. Review of the MIP images confirms the above findings. IMPRESSION: 1. No pulmonary embolus. 2. Dense left lower lobe pneumonia. 3. Mild hazy right upper lobe airspace disease concerning for pneumonia. 4. 13  mm right upper lobe and right lower lobe pulmonary nodules which may be secondary to an inflammatory or neoplastic etiology. Recommend follow-up CT of the chest in 3 months. Electronically Signed   By: Kathreen Devoid   On: 06/14/2017 12:41   Ct Abdomen Pelvis W Contrast  Result Date: 06/14/2017 CLINICAL DATA:  Acute generalized abdominal pain EXAM: CT ANGIOGRAPHY CHEST CT ABDOMEN AND PELVIS WITH CONTRAST TECHNIQUE: Multidetector CT imaging of the chest was performed using the standard protocol during bolus administration of intravenous contrast. Multiplanar CT image reconstructions and MIPs were obtained to evaluate the vascular anatomy. Multidetector CT imaging of the abdomen and pelvis was performed using the standard protocol during bolus administration of intravenous contrast. CONTRAST:  ISOVUE-370 IOPAMIDOL (ISOVUE-370) INJECTION 76% COMPARISON:  None. FINDINGS: CTA CHEST FINDINGS Cardiovascular: Satisfactory opacification of the pulmonary arteries to the segmental level. No evidence of pulmonary embolism. Normal heart size. No pericardial effusion. Mediastinum/Nodes: Enlarged subcarinal lymph node measuring 13 mm. Mild left hilar lymphadenopathy likely reactive. No axillary lymphadenopathy. Normal trachea and esophagus. Normal thyroid gland. Lungs/Pleura: Dense left lower lobe consolidation. 13 mm right lower lobe pulmonary nodule. 13 mm right upper lobe pulmonary nodule. Hazy airspace disease in the right upper lobe. Small left pleural effusion. No pneumothorax. Musculoskeletal: No acute osseous abnormality. No aggressive osseous lesion. Review of the MIP images confirms the above findings. CT ABDOMEN and PELVIS FINDINGS Hepatobiliary: No focal liver abnormality is seen. No gallstones, gallbladder wall thickening, or biliary dilatation. Pancreas: Unremarkable. No pancreatic ductal dilatation or surrounding inflammatory changes. Spleen: Multiple calcifications in the spleen as can be seen with prior  granulomatous disease. Adrenals/Urinary Tract: Adrenal glands are unremarkable. No urolithiasis or obstructive uropathy. 18 mm hypodense, fluid attenuating right interpolar renal mass most consistent with a cyst. Bladder is unremarkable. Stomach/Bowel: Small hiatal hernia. Stomach is otherwise within normal limits. No evidence of bowel wall thickening, distention, or inflammatory changes. Vascular/Lymphatic: Normal caliber abdominal aorta. Bilateral periaortic calcified lymph node as can be seen with prior granulomatous disease. Reproductive: Prostate is unremarkable. Other: No abdominal wall hernia or abnormality. No abdominopelvic ascites. Musculoskeletal: No aggressive osseous lesion. Left total hip arthroplasty. No acute osseous abnormality. Severe osteoarthritis of the right hip. Review of the MIP images confirms the above findings. IMPRESSION: 1. No pulmonary embolus. 2. Dense left lower lobe pneumonia. 3. Mild hazy right upper lobe airspace disease concerning for pneumonia. 4. 13 mm right upper lobe and right lower lobe pulmonary nodules which may be secondary to an inflammatory or neoplastic etiology. Recommend follow-up CT of the chest in 3 months. Electronically Signed   By: Kathreen Devoid   On: 06/14/2017 12:41    Echocardiogram 06/17/2017  LV EF: 60% -   65% ------------------------------------------------------------------- Study Conclusions - Left ventricle: The cavity size was normal. Wall  thickness was   normal. Systolic function was normal. The estimated ejection   fraction was in the range of 60% to 65%. Wall motion was normal;   there were no regional wall motion abnormalities. Left   ventricular diastolic function parameters were normal. - Aortic valve: There was no stenosis. - Aorta: Mildly dilated aortic root. Aortic root dimension: 38 mm   (ED). - Mitral valve: There was no regurgitation. - Left atrium: The atrium was mildly dilated. - Right ventricle: The cavity size was  normal. Systolic function   was normal. - Pulmonary arteries: No complete TR doppler jet so unable to   estimate PA systolic pressure. - Systemic veins: IVC measured 2.2 cm with > 50% respirophasic   variation, suggesting RA pressure 8 mmHg.  Impressions: - Normal LV size with EF 60-65%. Normal RV size and systolic   function. No significant valvular abnormalities.  Results/Tests Pending at Time of Discharge: None  Discharge Medications:  Allergies as of 06/18/2017      Reactions   Sulfa Antibiotics Rash   Tramadol Nausea Only      Medication List    TAKE these medications   albuterol 108 (90 Base) MCG/ACT inhaler Commonly known as:  PROVENTIL HFA;VENTOLIN HFA Inhale 2 puffs into the lungs every 6 (six) hours as needed for wheezing or shortness of breath.   amoxicillin 500 MG capsule Commonly known as:  AMOXIL Take 1 capsule (500 mg total) by mouth every 8 (eight) hours for 12 days.   benzonatate 200 MG capsule Commonly known as:  TESSALON Take 1 capsule (200 mg total) by mouth 3 (three) times daily.   Darunavir-Cobicisctat-Emtricitabine-Tenofovir Alafenamide 800-150-200-10 MG Tabs Commonly known as:  SYMTUZA Take 1 tablet by mouth daily with breakfast.   dextromethorphan-guaiFENesin 30-600 MG 12hr tablet Commonly known as:  MUCINEX DM Take 1 tablet by mouth 2 (two) times daily.   dolutegravir 50 MG tablet Commonly known as:  TIVICAY Take 1 tablet (50 mg total) by mouth daily.   feeding supplement (ENSURE ENLIVE) Liqd Take 237 mLs by mouth 2 (two) times daily between meals.   fluconazole 200 MG tablet Commonly known as:  DIFLUCAN Take 1 tablet (200 mg total) by mouth daily.   folic acid 1 MG tablet Commonly known as:  FOLVITE Take 1 tablet (1 mg total) by mouth daily.   gabapentin 100 MG capsule Commonly known as:  NEURONTIN Take 1 capsule (100 mg total) by mouth 3 (three) times daily.   ibuprofen 800 MG tablet Commonly known as:  ADVIL,MOTRIN Take 1  tablet (800 mg total) by mouth every 8 (eight) hours as needed for moderate pain. What changed:    medication strength  when to take this  reasons to take this  Another medication with the same name was removed. Continue taking this medication, and follow the directions you see here.   lidocaine 5 % Commonly known as:  LIDODERM Place 1 patch onto the skin daily. Remove & Discard patch within 12 hours or as directed by MD   multivitamin with minerals Tabs tablet Take 1 tablet by mouth daily.   oxyCODONE-acetaminophen 5-325 MG tablet Commonly known as:  PERCOCET/ROXICET Take 1 tablet by mouth every 6 (six) hours as needed for severe pain.   pantoprazole 40 MG tablet Commonly known as:  PROTONIX Take 1 tablet (40 mg total) by mouth daily.   valACYclovir 500 MG tablet Commonly known as:  VALTREX Take 1 tablet (500 mg total) by mouth daily.  Durable Medical Equipment  (From admission, onward)        Start     Ordered   06/18/17 1009  For home use only DME Cane  Once     06/18/17 1008      Discharge Instructions: Please refer to Patient Instructions section of EMR for full details.  Patient was counseled important signs and symptoms that should prompt return to medical care, changes in medications, dietary instructions, activity restrictions, and follow up appointments.   Follow-Up Appointments: Follow-up Information    [facility]              Follow up.   Contact information: [address] [place] [address]       [facility]. Call.   Why:  You can call to establish Primary Care Provider Contact information: [address] [place] [id number] [place] Follow up.   Why:  Arranged to have cane delivered prior to discharge Contact information: [place] [address] [phone]           Bonnita Hollow, MD 06/18/2017, 10:48 AM PGY-1, [place]

## 2017-06-15 NOTE — Progress Notes (Signed)
PHARMACY - PHYSICIAN COMMUNICATION CRITICAL VALUE ALERT - BLOOD CULTURE IDENTIFICATION (BCID)  [name] is an 52 y.o. male [facility] with a chief complaint of  chest pain and shortness of breath  Assessment:  2/2 blood cultures growing Strep Pneumo  Name of physician (or Provider) Contacted: Dr. Chanetta Marshallimberlake  Current antibiotics: Vancomycin and Cefepime   Changes to prescribed antibiotics recommended:   Suggest narrowing antibiotics to Rocephin 2 g IV q24h  Results for orders placed or performed during the hospital encounter of 06/14/17  Blood Culture ID Panel (Reflexed) (Collected: 06/14/2017  7:55 AM)  Result Value Ref Range   Enterococcus species NOT DETECTED NOT DETECTED   Listeria monocytogenes NOT DETECTED NOT DETECTED   Staphylococcus species NOT DETECTED NOT DETECTED   Staphylococcus aureus NOT DETECTED NOT DETECTED   Streptococcus species DETECTED (A) NOT DETECTED   Streptococcus agalactiae NOT DETECTED NOT DETECTED   Streptococcus pneumoniae DETECTED (A) NOT DETECTED   Streptococcus pyogenes NOT DETECTED NOT DETECTED   Acinetobacter baumannii NOT DETECTED NOT DETECTED   Enterobacteriaceae species NOT DETECTED NOT DETECTED   Enterobacter cloacae complex NOT DETECTED NOT DETECTED   Escherichia coli NOT DETECTED NOT DETECTED   Klebsiella oxytoca NOT DETECTED NOT DETECTED   Klebsiella pneumoniae NOT DETECTED NOT DETECTED   Proteus species NOT DETECTED NOT DETECTED   Serratia marcescens NOT DETECTED NOT DETECTED   Haemophilus influenzae NOT DETECTED NOT DETECTED   Neisseria meningitidis NOT DETECTED NOT DETECTED   Pseudomonas aeruginosa NOT DETECTED NOT DETECTED   Candida albicans NOT DETECTED NOT DETECTED   Candida glabrata NOT DETECTED NOT DETECTED   Candida krusei NOT DETECTED NOT DETECTED   Candida parapsilosis NOT DETECTED NOT DETECTED   Candida tropicalis NOT DETECTED NOT DETECTED    [name], [name] 06/15/2017  3:21  AM

## 2017-06-15 NOTE — Progress Notes (Signed)
Critical lab KCL 2.5.  Paged Dr. Kathe Bectonhombah KCL 2.5 this am.

## 2017-06-15 NOTE — Progress Notes (Signed)
Family Medicine Teaching Service Daily Progress Note Intern Pager: [phone]  Patient name: [name] Medical record number: [id number] Date of birth: 1965/04/12 Age: 52 y.o. Gender: male  Primary Care Provider: Patient, No Pcp Per Consultants: None Code Status: DNR  Pt Overview and Major Events to Date:  [name] is a 52 y.o. male presenting with chest pain, shortness of breath. PMH is significant for HIV (last viral count 3/24 undetectable and CD4 count 280, follows with Dr. Daiva Eves), asthma, GERD, hx of PE, aseptic necrosis of R femur, OA of bilateral hips.   Assessment and Plan:  PNA with pneumococcal bacteremia Remains afebrile, SORA. Blood cultures show pneumococcal bacteremia. Narrowing abx to CTX per pharm, likely transition to PO tomorrow. WBC improved from 17 to 11.  -follow blood cultures, CBC, fever curve  Chest pain: improved Likely due to PNA. Troponin mildly elevated to 0.05, possibly demand ischemia. Repeat EKG showed NSR.  - cont to monitor  HIV: stable, cont to monitor clinically   Hypokalemia: s/p oral repleatment labs still pending  AKI: Last Cr 1.28. Baseline ~1.1.  -monitor on daily BMP  Anemia: stable, mild at Hgb 11.7 - monitor CBC,  - consider iron studies outpatient  Lung nodules: 13mm RUL and RLL nodules, needs outpatient followup.  -outpatient CT chest in 3 months   Depression: stable, cont to monitor -Monitor  Bilateral hip OA: stable -tylenol prn  GERD: cont PPI  FEN/GI: regular diet, home PPI Prophylaxis: lovenox  Disposition: Monitor on IV CTX, likely PO abx tomorrow  Subjective:  Report SOB and chest pain improved. Denies any chills. Still has cough with some hemoptysis.   Objective: Temp:  [97.7 F (36.5 C)-98.6 F (37 C)] 98 F (36.7 C) (04/01 0541) Pulse Rate:  [71-95] 71 (04/01 0541) Resp:  [18-29] 18 (04/01 0541) BP: (98-131)/(58-80) 102/59 (04/01 0541) SpO2:  [93 %-100 %] 98 % (04/01 0541) Weight:   [194 lb 14.2 oz (88.4 kg)-198 lb (89.8 kg)] 194 lb 14.2 oz (88.4 kg) (03/31 2122) Physical Exam: General: NAD, lying in bed CVS: RRR, no MRG Lungs: lung sounds heard in all lung fields, no increase work of breathing while resting, appears to become tachypnic after prolonged coughing and talking Abdomen: Soft, nontender, nondistended MSK: No lower extremity edema, 2+ dp  Laboratory: Recent Labs  Lab 06/14/17 0700  WBC 17.5*  HGB 11.7*  HCT 33.9*  PLT 170   Recent Labs  Lab 06/14/17 0700  NA 137  K 2.9*  CL 110  CO2 17*  BUN 17  CREATININE 1.28*  CALCIUM 8.6*  PROT 7.2  BILITOT 1.0  ALKPHOS 105  ALT 27  AST 28  GLUCOSE 100*    Imaging/Diagnostic Tests: Dg Chest 2 View  Result Date: 06/14/2017 CLINICAL DATA:  Cough, chest pain and shortness of breath for 4 days. EXAM: CHEST - 2 VIEW COMPARISON:  09/19/2009 and prior chest radiographs FINDINGS: Airspace disease/consolidation involving much of the LEFT LOWER lobe identified likely representing pneumonia. Mild ill-defined opacities within the mid and lower RIGHT lung may represent developing airspace disease/pneumonia. Cardiomediastinal silhouette is otherwise unchanged. There may be a trace LEFT pleural effusion present. There is no evidence of pneumothorax or acute bony abnormality. IMPRESSION: Airspace disease/consolidation involving the majority of the LEFT LOWER lobe likely representing pneumonia. Mild opacities within the mid and lower RIGHT lung which may represent developing airspace disease/pneumonia. Radiographic follow-up to resolution is recommended. Electronically Signed   By: Harmon Pier M.D.   On: 06/14/2017  07:42   Ct Angio Chest Pe W And/or Wo Contrast  Result Date: 06/14/2017 CLINICAL DATA:  Acute generalized abdominal pain EXAM: CT ANGIOGRAPHY CHEST CT ABDOMEN AND PELVIS WITH CONTRAST TECHNIQUE: Multidetector CT imaging of the chest was performed using the standard protocol during bolus administration of  intravenous contrast. Multiplanar CT image reconstructions and MIPs were obtained to evaluate the vascular anatomy. Multidetector CT imaging of the abdomen and pelvis was performed using the standard protocol during bolus administration of intravenous contrast. CONTRAST:  ISOVUE-370 IOPAMIDOL (ISOVUE-370) INJECTION 76% COMPARISON:  None. FINDINGS: CTA CHEST FINDINGS Cardiovascular: Satisfactory opacification of the pulmonary arteries to the segmental level. No evidence of pulmonary embolism. Normal heart size. No pericardial effusion. Mediastinum/Nodes: Enlarged subcarinal lymph node measuring 13 mm. Mild left hilar lymphadenopathy likely reactive. No axillary lymphadenopathy. Normal trachea and esophagus. Normal thyroid gland. Lungs/Pleura: Dense left lower lobe consolidation. 13 mm right lower lobe pulmonary nodule. 13 mm right upper lobe pulmonary nodule. Hazy airspace disease in the right upper lobe. Small left pleural effusion. No pneumothorax. Musculoskeletal: No acute osseous abnormality. No aggressive osseous lesion. Review of the MIP images confirms the above findings. CT ABDOMEN and PELVIS FINDINGS Hepatobiliary: No focal liver abnormality is seen. No gallstones, gallbladder wall thickening, or biliary dilatation. Pancreas: Unremarkable. No pancreatic ductal dilatation or surrounding inflammatory changes. Spleen: Multiple calcifications in the spleen as can be seen with prior granulomatous disease. Adrenals/Urinary Tract: Adrenal glands are unremarkable. No urolithiasis or obstructive uropathy. 18 mm hypodense, fluid attenuating right interpolar renal mass most consistent with a cyst. Bladder is unremarkable. Stomach/Bowel: Small hiatal hernia. Stomach is otherwise within normal limits. No evidence of bowel wall thickening, distention, or inflammatory changes. Vascular/Lymphatic: Normal caliber abdominal aorta. Bilateral periaortic calcified lymph node as can be seen with prior granulomatous disease.  Reproductive: Prostate is unremarkable. Other: No abdominal wall hernia or abnormality. No abdominopelvic ascites. Musculoskeletal: No aggressive osseous lesion. Left total hip arthroplasty. No acute osseous abnormality. Severe osteoarthritis of the right hip. Review of the MIP images confirms the above findings. IMPRESSION: 1. No pulmonary embolus. 2. Dense left lower lobe pneumonia. 3. Mild hazy right upper lobe airspace disease concerning for pneumonia. 4. 13 mm right upper lobe and right lower lobe pulmonary nodules which may be secondary to an inflammatory or neoplastic etiology. Recommend follow-up CT of the chest in 3 months. Electronically Signed   By: Elige Ko   On: 06/14/2017 12:41   Ct Abdomen Pelvis W Contrast  Result Date: 06/14/2017 CLINICAL DATA:  Acute generalized abdominal pain EXAM: CT ANGIOGRAPHY CHEST CT ABDOMEN AND PELVIS WITH CONTRAST TECHNIQUE: Multidetector CT imaging of the chest was performed using the standard protocol during bolus administration of intravenous contrast. Multiplanar CT image reconstructions and MIPs were obtained to evaluate the vascular anatomy. Multidetector CT imaging of the abdomen and pelvis was performed using the standard protocol during bolus administration of intravenous contrast. CONTRAST:  ISOVUE-370 IOPAMIDOL (ISOVUE-370) INJECTION 76% COMPARISON:  None. FINDINGS: CTA CHEST FINDINGS Cardiovascular: Satisfactory opacification of the pulmonary arteries to the segmental level. No evidence of pulmonary embolism. Normal heart size. No pericardial effusion. Mediastinum/Nodes: Enlarged subcarinal lymph node measuring 13 mm. Mild left hilar lymphadenopathy likely reactive. No axillary lymphadenopathy. Normal trachea and esophagus. Normal thyroid gland. Lungs/Pleura: Dense left lower lobe consolidation. 13 mm right lower lobe pulmonary nodule. 13 mm right upper lobe pulmonary nodule. Hazy airspace disease in the right upper lobe. Small left pleural effusion. No  pneumothorax. Musculoskeletal: No acute osseous abnormality. No aggressive osseous lesion.  Review of the MIP images confirms the above findings. CT ABDOMEN and PELVIS FINDINGS Hepatobiliary: No focal liver abnormality is seen. No gallstones, gallbladder wall thickening, or biliary dilatation. Pancreas: Unremarkable. No pancreatic ductal dilatation or surrounding inflammatory changes. Spleen: Multiple calcifications in the spleen as can be seen with prior granulomatous disease. Adrenals/Urinary Tract: Adrenal glands are unremarkable. No urolithiasis or obstructive uropathy. 18 mm hypodense, fluid attenuating right interpolar renal mass most consistent with a cyst. Bladder is unremarkable. Stomach/Bowel: Small hiatal hernia. Stomach is otherwise within normal limits. No evidence of bowel wall thickening, distention, or inflammatory changes. Vascular/Lymphatic: Normal caliber abdominal aorta. Bilateral periaortic calcified lymph node as can be seen with prior granulomatous disease. Reproductive: Prostate is unremarkable. Other: No abdominal wall hernia or abnormality. No abdominopelvic ascites. Musculoskeletal: No aggressive osseous lesion. Left total hip arthroplasty. No acute osseous abnormality. Severe osteoarthritis of the right hip. Review of the MIP images confirms the above findings. IMPRESSION: 1. No pulmonary embolus. 2. Dense left lower lobe pneumonia. 3. Mild hazy right upper lobe airspace disease concerning for pneumonia. 4. 13 mm right upper lobe and right lower lobe pulmonary nodules which may be secondary to an inflammatory or neoplastic etiology. Recommend follow-up CT of the chest in 3 months. Electronically Signed   By: Elige KoHetal  Patel   On: 06/14/2017 12:41    Garnette Gunnerhompson, Aaron B, MD 06/15/2017, 7:05 AM PGY-1, [facility] FPT[facility] Intern pager: [phone], text pages welcome

## 2017-06-16 ENCOUNTER — Inpatient Hospital Stay (HOSPITAL_COMMUNITY): Payer: Medicare Other

## 2017-06-16 LAB — CBC
HCT: 29 % — ABNORMAL LOW (ref 39.0–52.0)
Hemoglobin: 9.8 g/dL — ABNORMAL LOW (ref 13.0–17.0)
MCH: 25.8 pg — ABNORMAL LOW (ref 26.0–34.0)
MCHC: 33.8 g/dL (ref 30.0–36.0)
MCV: 76.3 fL — AB (ref 78.0–100.0)
PLATELETS: 156 10*3/uL (ref 150–400)
RBC: 3.8 MIL/uL — ABNORMAL LOW (ref 4.22–5.81)
RDW: 16.4 % — ABNORMAL HIGH (ref 11.5–15.5)
WBC: 11 10*3/uL — ABNORMAL HIGH (ref 4.0–10.5)

## 2017-06-16 LAB — CULTURE, BLOOD (ROUTINE X 2)

## 2017-06-16 LAB — BASIC METABOLIC PANEL
Anion gap: 6 (ref 5–15)
Anion gap: 7 (ref 5–15)
Anion gap: 8 (ref 5–15)
BUN: 16 mg/dL (ref 6–20)
BUN: 15 mg/dL (ref 6–20)
BUN: 16 mg/dL (ref 6–20)
CALCIUM: 8.1 mg/dL — AB (ref 8.9–10.3)
CHLORIDE: 112 mmol/L — AB (ref 101–111)
CHLORIDE: 110 mmol/L (ref 101–111)
CHLORIDE: 110 mmol/L (ref 101–111)
CO2: 20 mmol/L — ABNORMAL LOW (ref 22–32)
CO2: 18 mmol/L — ABNORMAL LOW (ref 22–32)
CO2: 17 mmol/L — AB (ref 22–32)
CREATININE: 1.3 mg/dL — AB (ref 0.61–1.24)
CREATININE: 1.21 mg/dL (ref 0.61–1.24)
Calcium: 8.3 mg/dL — ABNORMAL LOW (ref 8.9–10.3)
Calcium: 8.2 mg/dL — ABNORMAL LOW (ref 8.9–10.3)
Creatinine, Ser: 1.36 mg/dL — ABNORMAL HIGH (ref 0.61–1.24)
GFR calc Af Amer: >60 mL/min (ref >60)
GFR calc non Af Amer: >60 mL/min (ref >60)
GFR calc non Af Amer: >60 mL/min (ref >60)
GFR calc non Af Amer: 60 mL/min — ABNORMAL LOW (ref >60)
Glucose, Bld: 109 mg/dL — ABNORMAL HIGH (ref 65–99)
Glucose, Bld: 117 mg/dL — ABNORMAL HIGH (ref 65–99)
Glucose, Bld: 130 mg/dL — ABNORMAL HIGH (ref 65–99)
POTASSIUM: 2.8 mmol/L — AB (ref 3.5–5.1)
Potassium: 2.5 mmol/L — CL (ref 3.5–5.1)
Potassium: 2.8 mmol/L — ABNORMAL LOW (ref 3.5–5.1)
SODIUM: 135 mmol/L (ref 135–145)
SODIUM: 135 mmol/L (ref 135–145)
Sodium: 138 mmol/L (ref 135–145)

## 2017-06-16 LAB — MRSA PCR SCREENING: MRSA BY PCR: NEGATIVE

## 2017-06-16 LAB — MAGNESIUM: Magnesium: 1.9 mg/dL (ref 1.7–2.4)

## 2017-06-16 MED ORDER — DOLUTEGRAVIR SODIUM 50 MG PO TABS
50.0000 mg | ORAL_TABLET | Freq: Every day | ORAL | Status: DC
  Administered 2017-06-17 – 2017-06-18 (×2): 50 mg via ORAL
  Filled 2017-06-16 (×2): qty 1

## 2017-06-16 MED ORDER — CEFDINIR 300 MG PO CAPS
300.0000 mg | ORAL_CAPSULE | Freq: Two times a day (BID) | ORAL | Status: DC
  Filled 2017-06-16: qty 1

## 2017-06-16 MED ORDER — LEVOFLOXACIN 750 MG PO TABS
750.0000 mg | ORAL_TABLET | Freq: Every day | ORAL | Status: DC
  Administered 2017-06-16 – 2017-06-17 (×2): 750 mg via ORAL
  Filled 2017-06-16 (×2): qty 1

## 2017-06-16 MED ORDER — BENZONATATE 100 MG PO CAPS
200.0000 mg | ORAL_CAPSULE | Freq: Three times a day (TID) | ORAL | Status: DC
  Administered 2017-06-16 – 2017-06-18 (×7): 200 mg via ORAL
  Filled 2017-06-16 (×7): qty 2

## 2017-06-16 MED ORDER — POTASSIUM CHLORIDE 10 MEQ/50ML IV SOLN
10.0000 meq | INTRAVENOUS | Status: DC
  Filled 2017-06-16 (×6): qty 50

## 2017-06-16 MED ORDER — ALBUTEROL SULFATE (2.5 MG/3ML) 0.083% IN NEBU
2.5000 mg | INHALATION_SOLUTION | RESPIRATORY_TRACT | Status: DC
  Administered 2017-06-16: 2.5 mg via RESPIRATORY_TRACT
  Filled 2017-06-16 (×2): qty 3

## 2017-06-16 MED ORDER — HYDROCODONE-ACETAMINOPHEN 7.5-325 MG/15ML PO SOLN
10.0000 mL | ORAL | Status: DC | PRN
  Administered 2017-06-16 – 2017-06-18 (×5): 10 mL via ORAL
  Filled 2017-06-16 (×6): qty 15

## 2017-06-16 MED ORDER — ENSURE ENLIVE PO LIQD
237.0000 mL | Freq: Two times a day (BID) | ORAL | Status: DC
  Administered 2017-06-16 – 2017-06-17 (×3): 237 mL via ORAL

## 2017-06-16 MED ORDER — POTASSIUM CHLORIDE CRYS ER 20 MEQ PO TBCR: 40.0000 meq | EXTENDED_RELEASE_TABLET | Freq: Two times a day (BID) | ORAL | Status: DC

## 2017-06-16 MED ORDER — ALBUTEROL SULFATE (2.5 MG/3ML) 0.083% IN NEBU: 2.5000 mg | INHALATION_SOLUTION | RESPIRATORY_TRACT | Status: AC

## 2017-06-16 MED ORDER — POTASSIUM CHLORIDE CRYS ER 20 MEQ PO TBCR: 40.0000 meq | EXTENDED_RELEASE_TABLET | ORAL | Status: DC

## 2017-06-16 MED ORDER — HYDROCODONE-ACETAMINOPHEN 7.5-325 MG/15ML PO SOLN
10.0000 mL | ORAL | Status: AC
  Administered 2017-06-16: 10 mL via ORAL
  Filled 2017-06-16: qty 15

## 2017-06-16 MED ORDER — POTASSIUM CHLORIDE CRYS ER 20 MEQ PO TBCR
40.0000 meq | EXTENDED_RELEASE_TABLET | Freq: Once | ORAL | Status: AC
  Administered 2017-06-16: 40 meq via ORAL
  Filled 2017-06-16: qty 2

## 2017-06-16 MED ORDER — MAGNESIUM SULFATE 2 GM/50ML IV SOLN
2.0000 g | Freq: Once | INTRAVENOUS | Status: AC
  Administered 2017-06-16: 2 g via INTRAVENOUS
  Filled 2017-06-16: qty 50

## 2017-06-16 MED ORDER — IBUPROFEN 400 MG PO TABS
400.0000 mg | ORAL_TABLET | Freq: Four times a day (QID) | ORAL | Status: DC | PRN
  Administered 2017-06-16 – 2017-06-17 (×2): 400 mg via ORAL
  Filled 2017-06-16 (×2): qty 1

## 2017-06-16 MED ORDER — ALBUTEROL SULFATE (2.5 MG/3ML) 0.083% IN NEBU: 2.5000 mg | INHALATION_SOLUTION | RESPIRATORY_TRACT | Status: DC | PRN

## 2017-06-16 MED ORDER — POTASSIUM CHLORIDE 10 MEQ/100ML IV SOLN
10.0000 meq | INTRAVENOUS | Status: DC
  Filled 2017-06-16: qty 100

## 2017-06-16 MED ORDER — POTASSIUM CHLORIDE 10 MEQ/100ML IV SOLN
10.0000 meq | INTRAVENOUS | Status: AC
  Administered 2017-06-16 (×6): 10 meq via INTRAVENOUS
  Filled 2017-06-16 (×6): qty 100

## 2017-06-16 MED ORDER — CEFDINIR 300 MG PO CAPS
600.0000 mg | ORAL_CAPSULE | Freq: Every day | ORAL | Status: DC
  Administered 2017-06-16: 600 mg via ORAL
  Filled 2017-06-16: qty 2

## 2017-06-16 MED ORDER — CEFDINIR 300 MG PO CAPS: 600.0000 mg | ORAL_CAPSULE | Freq: Every day | ORAL | Status: DC

## 2017-06-16 NOTE — Progress Notes (Signed)
Spoke with patient's infectious disease doctor Dr. Daiva EvesVan Dam regarding [name]'s current hospitalization.  Dr. Daiva EvesVan Dam recommended switching to 750 mg of Levaquin for 2 weeks for higher bioavailability for patient's pneumonia in the setting of bacteremia.  Also recommended getting 2D echo to evaluate cardiac leaflet valves.  We will place these orders.  Appreciate infectious disease recommendations.

## 2017-06-16 NOTE — Progress Notes (Signed)
Initial Nutrition Assessment  DOCUMENTATION CODES:   Not applicable  INTERVENTION:  Recommend: Ensure Enlive po BID, each supplement provides 350 kcal and 20 grams of protein   NUTRITION DIAGNOSIS:   Increased nutrient needs related to chronic illness(HIV) as evidenced by estimated needs.  GOAL:   Patient will meet greater than or equal to 90% of their needs  MONITOR:   PO intake, Supplement acceptance, Weight trends, Labs, I & O's  REASON FOR ASSESSMENT:   Consult Assessment of nutrition requirement/status  ASSESSMENT:   52 y.o. M admitted for pneumonia with sepsis. PMH of HIV, anemia, MRSA, C. Dif, osteoarthritis to b/l hips, PE, depression, hypotension, and asthma.  PSH of L hip replacement. Pt is a current smoker.   Spoke with pt regarding current appetite and appetite PTA. Pt reports that he has a good appetite; here he eats two trays a meal and that at home he would eat 4-5 large meals a day.   Pt reports that his weight has been relatively stable and suspects that he may have gained some weight recently; UBW 198-205 lbs, pt believes he is now 194 lbs. He states that he tries to get moving every day walking 3-10 miles per day, but has very stiff legs and does not ambulate easily due to past hip replacement and chronic bilateral osteoarthritis. Due to bilateral LE muscle wasting, difficulty ambulating and bilateral stiffness suspect that pt may not be walking reported distance.   Due to increased needs, pt would benefit from a nutrition supplement during admission.    Medications reviewed: cefdinir, folvite, MVI with minerals, thiamine, protonix, magnesium IV 50 ml, K + 100 ml D/C'd.   Labs reviewed: K + 2.5 (L), Cl 112 (H), CO2 (L), BG 109 (H), creatinine 1.30 (H), WBC 11 (H), RBC 3.8 (L), hemoglobin 9.8 (L), HCT 29 (L).    Intake/Output Summary (Last 24 hours) at 06/16/2017 1209 Last data filed at 06/16/2017 1000 Gross per 24 hour  Intake 820 ml  Output 0 ml  Net  820 ml     NUTRITION - FOCUSED PHYSICAL EXAM:    Most Recent Value  Orbital Region  No depletion  Upper Arm Region  No depletion  Thoracic and Lumbar Region  No depletion  Buccal Region  No depletion  Temple Region  No depletion  Clavicle Bone Region  No depletion  Clavicle and Acromion Bone Region  No depletion  Scapular Bone Region  No depletion  Dorsal Hand  No depletion  Patellar Region  Mild depletion  Anterior Thigh Region  Mild depletion  Posterior Calf Region  Mild depletion  Edema (RD Assessment)  None  Hair  Reviewed  Eyes  Reviewed  Mouth  Reviewed  Skin  Reviewed  Nails  Reviewed       Diet Order:  Diet regular Room service appropriate? Yes; Fluid consistency: Thin  EDUCATION NEEDS:   Education needs have been addressed(Spoke with pt about the importance of nutrition in helping him recover )  Skin:  Skin Assessment: Reviewed RN Assessment  Last BM:  06/13/17  Height:   Ht Readings from Last 1 Encounters:  06/14/17 6\' 2"  (1.88 m)    Weight:   Wt Readings from Last 1 Encounters:  06/15/17 195 lb (88.5 kg)   UBW: % UBW:  Ideal Body Weight:  86.36 kg  BMI:  Body mass index is 25.04 kg/m.  Estimated Nutritional Needs:   Kcal:  2200-2400 kcal (MSJ x 1.2-1.3)  Protein:  105- 120 grams (  x 1.2-1.35)  Fluid:  > 1.8 L (ml/kcal)    Sherrine MaplesMelissa Noheli Melder, Dietetic Intern

## 2017-06-16 NOTE — Care Management Note (Addendum)
Case Management Note  Patient Details  Name: [name] MRN: [id number] Date of Birth: 10/07/1965  Subjective/Objective:  HIV ( follows w/ Dr. Daiva EvesVan Dam), asthma, GERD,PE, aseptic necrosis of R femur, OA of bilateral hips; Admitted for Strep Pneumo        Action/Plan: Prior to admission patient lived at home.  Will be returning to the same living situation after discharge.  At discharge, patient has transportation home.  Patient has the ability to pay for  Medications/food.  NCM will continue to follow for discharge transition needs.  Expected Discharge Date:    06/17/2017              Expected Discharge Plan:  Home/Self Care  In-House Referral:   Nutrition Discharge planning Services  CM Consult  Status of Service:  In process, will continue to follow  Yancey FlemingsKimberly R Becton, RN  Nurse case manager [place] 06/16/2017, 12:52 PM

## 2017-06-16 NOTE — Progress Notes (Addendum)
Family Medicine Teaching Service Daily Progress Note Intern Pager: [phone]  Patient name: [name] Medical record number: [id number] Date of birth: 11-27-1965 Age: 52 y.o. Gender: male  Primary Care Provider: Patient, No Pcp Per Consultants: None Code Status: DNR  Pt Overview and Major Events to Date:  [name] is a 52 y.o. male presenting with chest pain, shortness of breath. PMH is significant for HIV (last viral count 3/24 undetectable and CD4 count 280, follows with Dr. Daiva Eves), asthma, GERD, hx of PE, aseptic necrosis of R femur, OA of bilateral hips.   Assessment and Plan:  PNA with pneumococcal bacteremia Remains afebrile, SORA. CXR shows no change. Blood cultures show pneumococcal bacteremia. WBC stable at 11.  -Transition to oral cefdinir 600 mg -follow blood cultures, CBC, fever curve  H/o of Asthma. Reports worse after last pneumonia. Not on any controller. Becomes SOB after coughing. No wheeze on exam -albuterol PRN   H/o malnutrition and hypoalbuminemia Albumin low at 2.2. -Appreciate Nutrition consult  Chest pain:  Likely due to courgh PNA. Troponin <0.03>0.05><0.03, Repeat EKG showed NSR.  - cont to monitor -mucinex and tessalon perles  HIV: stable, cont to monitor clinically   Hypokalemia: 2.7 this AM.  -replete with IV x6 +40 kdur. -repete bmp this AM  Hypomagnasemia. Mild @ 1.9. Will replete x1 with 2g IV  AKI: Cr 1.3. Baseline ~1.1.  -monitor on daily BMP  Anemia: stable, mild at Hgb 10.8 - monitor CBC,  - consider iron studies outpatient  Lung nodules: 13mm RUL and RLL nodules, needs outpatient followup.  -outpatient CT chest in 3 months   Depression: stable, cont to monitor -Monitor  Bilateral hip OA: stable -tylenol prn  GERD: cont PPI  FEN/GI: regular diet, home PPI Prophylaxis: lovenox  Disposition: Monitor on IV CTX, likely PO abx tomorrow  Subjective:  Report SOB and chest pain improved. Denies  any chills. Still has cough with some hemoptysis.   Objective: Temp:  [98.1 F (36.7 C)-98.6 F (37 C)] 98.6 F (37 C) (04/02 1041) Pulse Rate:  [66-85] 85 (04/02 1041) Resp:  [17-18] 17 (04/02 1041) BP: (117-133)/(66-76) 127/76 (04/02 1041) SpO2:  [98 %-100 %] 99 % (04/02 1041) Weight:  [195 lb (88.5 kg)] 195 lb (88.5 kg) (04/01 2116) Physical Exam: General: NAD, lying in bed CVS: RRR, no MRG Lungs: normal lung sounds heard in all lung fields, no increase work of breathing while resting, appears to become tachypnic after prolonged coughing and talking Abdomen: Soft, nontender, nondistended MSK: No lower extremity edema, 2+ dp  Laboratory: Recent Labs  Lab 06/14/17 0700 06/15/17 0746 06/16/17 0731  WBC 17.5* 11.6* 11.0*  HGB 11.7* 10.1* 9.8*  HCT 33.9* 29.8* 29.0*  PLT 170 166 156   Recent Labs  Lab 06/14/17 0700 06/15/17 0746 06/15/17 1548 06/16/17 0731  NA 137 140  --  138  K 2.9* 2.5* 2.7* 2.5*  CL 110 114*  --  112*  CO2 17* 18*  --  20*  BUN 17 16  --  16  CREATININE 1.28* 1.26*  --  1.30*  CALCIUM 8.6* 8.4*  --  8.3*  PROT 7.2 6.1*  --   --   BILITOT 1.0 1.1  --   --   ALKPHOS 105 95  --   --   ALT 27 30  --   --   AST 28 25  --   --   GLUCOSE 100* 92  --  109*  Imaging/Diagnostic Tests: Dg Chest 2 View  Result Date: 06/14/2017 CLINICAL DATA:  Cough, chest pain and shortness of breath for 4 days. EXAM: CHEST - 2 VIEW COMPARISON:  09/19/2009 and prior chest radiographs FINDINGS: Airspace disease/consolidation involving much of the LEFT LOWER lobe identified likely representing pneumonia. Mild ill-defined opacities within the mid and lower RIGHT lung may represent developing airspace disease/pneumonia. Cardiomediastinal silhouette is otherwise unchanged. There may be a trace LEFT pleural effusion present. There is no evidence of pneumothorax or acute bony abnormality. IMPRESSION: Airspace disease/consolidation involving the majority of the LEFT LOWER lobe  likely representing pneumonia. Mild opacities within the mid and lower RIGHT lung which may represent developing airspace disease/pneumonia. Radiographic follow-up to resolution is recommended. Electronically Signed   By: Harmon PierJeffrey  Hu M.D.   On: 06/14/2017 07:42   Ct Angio Chest Pe W And/or Wo Contrast  Result Date: 06/14/2017 CLINICAL DATA:  Acute generalized abdominal pain EXAM: CT ANGIOGRAPHY CHEST CT ABDOMEN AND PELVIS WITH CONTRAST TECHNIQUE: Multidetector CT imaging of the chest was performed using the standard protocol during bolus administration of intravenous contrast. Multiplanar CT image reconstructions and MIPs were obtained to evaluate the vascular anatomy. Multidetector CT imaging of the abdomen and pelvis was performed using the standard protocol during bolus administration of intravenous contrast. CONTRAST:  ISOVUE-370 IOPAMIDOL (ISOVUE-370) INJECTION 76% COMPARISON:  None. FINDINGS: CTA CHEST FINDINGS Cardiovascular: Satisfactory opacification of the pulmonary arteries to the segmental level. No evidence of pulmonary embolism. Normal heart size. No pericardial effusion. Mediastinum/Nodes: Enlarged subcarinal lymph node measuring 13 mm. Mild left hilar lymphadenopathy likely reactive. No axillary lymphadenopathy. Normal trachea and esophagus. Normal thyroid gland. Lungs/Pleura: Dense left lower lobe consolidation. 13 mm right lower lobe pulmonary nodule. 13 mm right upper lobe pulmonary nodule. Hazy airspace disease in the right upper lobe. Small left pleural effusion. No pneumothorax. Musculoskeletal: No acute osseous abnormality. No aggressive osseous lesion. Review of the MIP images confirms the above findings. CT ABDOMEN and PELVIS FINDINGS Hepatobiliary: No focal liver abnormality is seen. No gallstones, gallbladder wall thickening, or biliary dilatation. Pancreas: Unremarkable. No pancreatic ductal dilatation or surrounding inflammatory changes. Spleen: Multiple calcifications in the spleen  as can be seen with prior granulomatous disease. Adrenals/Urinary Tract: Adrenal glands are unremarkable. No urolithiasis or obstructive uropathy. 18 mm hypodense, fluid attenuating right interpolar renal mass most consistent with a cyst. Bladder is unremarkable. Stomach/Bowel: Small hiatal hernia. Stomach is otherwise within normal limits. No evidence of bowel wall thickening, distention, or inflammatory changes. Vascular/Lymphatic: Normal caliber abdominal aorta. Bilateral periaortic calcified lymph node as can be seen with prior granulomatous disease. Reproductive: Prostate is unremarkable. Other: No abdominal wall hernia or abnormality. No abdominopelvic ascites. Musculoskeletal: No aggressive osseous lesion. Left total hip arthroplasty. No acute osseous abnormality. Severe osteoarthritis of the right hip. Review of the MIP images confirms the above findings. IMPRESSION: 1. No pulmonary embolus. 2. Dense left lower lobe pneumonia. 3. Mild hazy right upper lobe airspace disease concerning for pneumonia. 4. 13 mm right upper lobe and right lower lobe pulmonary nodules which may be secondary to an inflammatory or neoplastic etiology. Recommend follow-up CT of the chest in 3 months. Electronically Signed   By: Elige KoHetal  Patel   On: 06/14/2017 12:41   Ct Abdomen Pelvis W Contrast  Result Date: 06/14/2017 CLINICAL DATA:  Acute generalized abdominal pain EXAM: CT ANGIOGRAPHY CHEST CT ABDOMEN AND PELVIS WITH CONTRAST TECHNIQUE: Multidetector CT imaging of the chest was performed using the standard protocol during bolus administration of intravenous contrast. Multiplanar  CT image reconstructions and MIPs were obtained to evaluate the vascular anatomy. Multidetector CT imaging of the abdomen and pelvis was performed using the standard protocol during bolus administration of intravenous contrast. CONTRAST:  ISOVUE-370 IOPAMIDOL (ISOVUE-370) INJECTION 76% COMPARISON:  None. FINDINGS: CTA CHEST FINDINGS Cardiovascular:  Satisfactory opacification of the pulmonary arteries to the segmental level. No evidence of pulmonary embolism. Normal heart size. No pericardial effusion. Mediastinum/Nodes: Enlarged subcarinal lymph node measuring 13 mm. Mild left hilar lymphadenopathy likely reactive. No axillary lymphadenopathy. Normal trachea and esophagus. Normal thyroid gland. Lungs/Pleura: Dense left lower lobe consolidation. 13 mm right lower lobe pulmonary nodule. 13 mm right upper lobe pulmonary nodule. Hazy airspace disease in the right upper lobe. Small left pleural effusion. No pneumothorax. Musculoskeletal: No acute osseous abnormality. No aggressive osseous lesion. Review of the MIP images confirms the above findings. CT ABDOMEN and PELVIS FINDINGS Hepatobiliary: No focal liver abnormality is seen. No gallstones, gallbladder wall thickening, or biliary dilatation. Pancreas: Unremarkable. No pancreatic ductal dilatation or surrounding inflammatory changes. Spleen: Multiple calcifications in the spleen as can be seen with prior granulomatous disease. Adrenals/Urinary Tract: Adrenal glands are unremarkable. No urolithiasis or obstructive uropathy. 18 mm hypodense, fluid attenuating right interpolar renal mass most consistent with a cyst. Bladder is unremarkable. Stomach/Bowel: Small hiatal hernia. Stomach is otherwise within normal limits. No evidence of bowel wall thickening, distention, or inflammatory changes. Vascular/Lymphatic: Normal caliber abdominal aorta. Bilateral periaortic calcified lymph node as can be seen with prior granulomatous disease. Reproductive: Prostate is unremarkable. Other: No abdominal wall hernia or abnormality. No abdominopelvic ascites. Musculoskeletal: No aggressive osseous lesion. Left total hip arthroplasty. No acute osseous abnormality. Severe osteoarthritis of the right hip. Review of the MIP images confirms the above findings. IMPRESSION: 1. No pulmonary embolus. 2. Dense left lower lobe pneumonia. 3.  Mild hazy right upper lobe airspace disease concerning for pneumonia. 4. 13 mm right upper lobe and right lower lobe pulmonary nodules which may be secondary to an inflammatory or neoplastic etiology. Recommend follow-up CT of the chest in 3 months. Electronically Signed   By: Elige Ko   On: 06/14/2017 12:41    Garnette Gunner, MD 06/16/2017, 11:01 AM PGY-1, [facility] Family Medicine FPTS Intern pager: [phone], text pages welcome

## 2017-06-17 ENCOUNTER — Inpatient Hospital Stay (HOSPITAL_COMMUNITY): Payer: Medicare Other

## 2017-06-17 ENCOUNTER — Other Ambulatory Visit: Payer: Self-pay

## 2017-06-17 DIAGNOSIS — R079 Chest pain, unspecified: Secondary | ICD-10-CM

## 2017-06-17 LAB — CBC
HCT: 29.2 % — ABNORMAL LOW (ref 39.0–52.0)
Hemoglobin: 10 g/dL — ABNORMAL LOW (ref 13.0–17.0)
MCH: 26.2 pg (ref 26.0–34.0)
MCHC: 34.2 g/dL (ref 30.0–36.0)
MCV: 76.6 fL — AB (ref 78.0–100.0)
Platelets: 155 10*3/uL (ref 150–400)
RBC: 3.81 MIL/uL — ABNORMAL LOW (ref 4.22–5.81)
RDW: 17 % — AB (ref 11.5–15.5)
WBC: 11.4 10*3/uL — ABNORMAL HIGH (ref 4.0–10.5)

## 2017-06-17 LAB — BASIC METABOLIC PANEL
Anion gap: 8 (ref 5–15)
BUN: 13 mg/dL (ref 6–20)
CHLORIDE: 112 mmol/L — AB (ref 101–111)
CO2: 17 mmol/L — AB (ref 22–32)
CREATININE: 1.05 mg/dL (ref 0.61–1.24)
Calcium: 8.3 mg/dL — ABNORMAL LOW (ref 8.9–10.3)
GFR calc Af Amer: >60 mL/min (ref >60)
GFR calc non Af Amer: >60 mL/min (ref >60)
GLUCOSE: 120 mg/dL — AB (ref 65–99)
Potassium: 3.4 mmol/L — ABNORMAL LOW (ref 3.5–5.1)
Sodium: 137 mmol/L (ref 135–145)

## 2017-06-17 LAB — ECHOCARDIOGRAM COMPLETE
HEIGHTINCHES: 74 in
Weight: 3111.13 oz

## 2017-06-17 LAB — MAGNESIUM: Magnesium: 1.7 mg/dL (ref 1.7–2.4)

## 2017-06-17 MED ORDER — ALBUTEROL SULFATE (2.5 MG/3ML) 0.083% IN NEBU
2.5000 mg | INHALATION_SOLUTION | RESPIRATORY_TRACT | Status: DC
  Administered 2017-06-17 – 2017-06-18 (×4): 2.5 mg via RESPIRATORY_TRACT
  Filled 2017-06-17 (×4): qty 3

## 2017-06-17 MED ORDER — ONDANSETRON 4 MG PO TBDP
4.0000 mg | ORAL_TABLET | Freq: Once | ORAL | Status: AC
  Administered 2017-06-17: 4 mg via ORAL
  Filled 2017-06-17: qty 1

## 2017-06-17 MED ORDER — ALBUTEROL SULFATE (2.5 MG/3ML) 0.083% IN NEBU
2.5000 mg | INHALATION_SOLUTION | RESPIRATORY_TRACT | Status: DC
  Administered 2017-06-17 (×2): 2.5 mg via RESPIRATORY_TRACT
  Filled 2017-06-17 (×2): qty 3

## 2017-06-17 MED ORDER — MAGNESIUM SULFATE 2 GM/50ML IV SOLN: 2.0000 g | Freq: Once | INTRAVENOUS | Status: DC

## 2017-06-17 MED ORDER — BENZONATATE 100 MG PO CAPS: 200.0000 mg | ORAL_CAPSULE | Freq: Three times a day (TID) | ORAL | Status: DC

## 2017-06-17 MED ORDER — LIDOCAINE 5 % EX PTCH
1.0000 | MEDICATED_PATCH | CUTANEOUS | Status: DC
  Administered 2017-06-17: 1 via TRANSDERMAL
  Filled 2017-06-17: qty 1

## 2017-06-17 MED ORDER — POTASSIUM CHLORIDE CRYS ER 20 MEQ PO TBCR
40.0000 meq | EXTENDED_RELEASE_TABLET | Freq: Once | ORAL | Status: AC
  Administered 2017-06-17: 40 meq via ORAL
  Filled 2017-06-17: qty 2

## 2017-06-17 MED ORDER — MAGNESIUM SULFATE 2 GM/50ML IV SOLN
2.0000 g | Freq: Once | INTRAVENOUS | Status: AC
  Administered 2017-06-17: 2 g via INTRAVENOUS
  Filled 2017-06-17: qty 50

## 2017-06-17 NOTE — Progress Notes (Signed)
  Echocardiogram 2D Echocardiogram was attempted but patient wanted to eat lunch. We will try again later.   Joseph SavoyCasey N Abraham Entwistle 06/17/2017, 1:14 PM

## 2017-06-17 NOTE — Progress Notes (Signed)
FPTS Interim Progress Note  Paged by nursing. Patient refusing runs of KCl overnight. Already received 6 x 10 mEq KCl during the day today with mild improvement in K to 2.8. Patient is expressing understanding of the need for potassium supplementation but he wants to sleep.  - We can cancel additional IV K for now and give 40 mEq KDUR, recheck AM K.   - Will likely need further repletion in AM.  Howard PouchFeng, Jaylnn Ullery, MD 06/17/2017, 12:39 AM PGY-2, [facility] pager [phone]

## 2017-06-17 NOTE — Progress Notes (Signed)
  Echocardiogram 2D Echocardiogram has been performed.  Joseph SavoyCasey N Daysy Haas 06/17/2017, 2:52 PM

## 2017-06-17 NOTE — Evaluation (Signed)
Physical Therapy Evaluation Patient Details Name: [name] MRN: [id number] DOB: [birth year] Today's Date: 06/17/2017   History of Present Illness  Pt is a 52 y/o male admitted seocondary to chest pain and SOB. CT of chest negative for PE, however, showed dense L lower lobe pneumonia, and R upper/lower lobe pulmonary nodules. PMH includes HIV, asthma, PE, and aseptic necrosis of R femur.   Clinical Impression  Pt admitted secondary to problem above with deficits below. Pt with SOB during gait with DOE at 2-3/4, however, oxygen sats maintained at 94-95% on RA. Slightly unsteady and min guard A provided throughout gait. Will continue to follow acutely to maximize functional mobility independence and safety.     Follow Up Recommendations No PT follow up    Equipment Recommendations  None recommended by PT    Recommendations for Other Services       Precautions / Restrictions Precautions Precautions: None Restrictions Weight Bearing Restrictions: No      Mobility  Bed Mobility Overal bed mobility: Independent             General bed mobility comments: Upon sitting up pt coughing for ~30 seconds. Gave pillow to support L chest secondary to increased pain. Oxygen sats at 95% on RA.   Transfers Overall transfer level: Needs assistance Equipment used: None Transfers: Sit to/from Stand Sit to Stand: Supervision         General transfer comment: Supervision for safety.   Ambulation/Gait Ambulation/Gait assistance: Min guard Ambulation Distance (Feet): 200 Feet Assistive device: None Gait Pattern/deviations: Step-through pattern;Decreased stride length Gait velocity: Decreased  Gait velocity interpretation: Below normal speed for age/gender General Gait Details: Slow, guarded gait. Mild unsteadiness noted however, no LOB noted. Notable SOB, however, oxygen sats at 94-95% on RA.   Stairs            Wheelchair Mobility    Modified Rankin (Stroke Patients  Only)       Balance Overall balance assessment: Needs assistance Sitting-balance support: No upper extremity supported;Feet supported Sitting balance-Leahy Scale: Normal     Standing balance support: No upper extremity supported;During functional activity Standing balance-Leahy Scale: Fair                               Pertinent Vitals/Pain Pain Assessment: 0-10 Pain Score: 10-Worst pain ever Pain Location: L ribs when he coughs, otherwise does not have pain  Pain Descriptors / Indicators: Sharp Pain Intervention(s): Limited activity within patient's tolerance;Monitored during session;Repositioned    Home Living Family/patient expects to be discharged to:: Private residence Living Arrangements: Spouse/significant other Available Help at Discharge: Family;Available 24 hours/day Type of Home: House Home Access: Stairs to enter Entrance Stairs-Rails: Left Entrance Stairs-Number of Steps: 4 Home Layout: One level Home Equipment: None      Prior Function Level of Independence: Independent               Hand Dominance   Dominant Hand: Right    Extremity/Trunk Assessment   Upper Extremity Assessment Upper Extremity Assessment: Overall WFL for tasks assessed    Lower Extremity Assessment Lower Extremity Assessment: Overall WFL for tasks assessed    Cervical / Trunk Assessment Cervical / Trunk Assessment: Normal  Communication   Communication: No difficulties  Cognition Arousal/Alertness: Awake/alert Behavior During Therapy: WFL for tasks assessed/performed Overall Cognitive Status: Within Functional Limits for tasks assessed  General Comments General comments (skin integrity, edema, etc.): Pt's wife present during session.     Exercises Other Exercises Other Exercises: Reviewed incentive spirometry technique and frequency to perform everyday. Had pt practice X10 breaths.     Assessment/Plan    PT Assessment Patient needs continued PT services  PT Problem List Decreased activity tolerance;Decreased mobility;Cardiopulmonary status limiting activity;Pain       PT Treatment Interventions Gait training;Stair training;Functional mobility training;Therapeutic activities;Therapeutic exercise;Balance training;Neuromuscular re-education;Patient/family education    PT Goals (Current goals can be found in the Care Plan section)  Acute Rehab PT Goals Patient Stated Goal: to go home  PT Goal Formulation: With patient Time For Goal Achievement: 06/24/17 Potential to Achieve Goals: Good    Frequency Min 3X/week   Barriers to discharge        Co-evaluation               AM-PAC PT "6 Clicks" Daily Activity  Outcome Measure Difficulty turning over in bed (including adjusting bedclothes, sheets and blankets)?: None Difficulty moving from lying on back to sitting on the side of the bed? : None Difficulty sitting down on and standing up from a chair with arms (e.g., wheelchair, bedside commode, etc,.)?: A Little Help needed moving to and from a bed to chair (including a wheelchair)?: A Little Help needed walking in hospital room?: A Little Help needed climbing 3-5 steps with a railing? : A Little 6 Click Score: 20    End of Session Equipment Utilized During Treatment: Gait belt Activity Tolerance: Patient tolerated treatment well Patient left: in bed;with call bell/phone within reach;with family/visitor present Nurse Communication: Mobility status PT Visit Diagnosis: Other abnormalities of gait and mobility (R26.89);Pain Pain - Right/Left: Left Pain - part of body: (chest )    Time: [id number] PT Time Calculation (min) (ACUTE ONLY): 26 min   Charges:   PT Evaluation $PT Eval Low Complexity: 1 Low PT Treatments $Gait Training: 8-22 mins   PT G Codes:        Gladys DammeBrittany Danylle Ouk, PT, DPT  Acute Rehabilitation Services  Pager:  [phone]   Lehman PromBrittany S Shamira Toutant 06/17/2017, 12:08 PM

## 2017-06-17 NOTE — Progress Notes (Signed)
Family Medicine Teaching Service Daily Progress Note Intern Pager: [phone]  Patient name: [name] Medical record number: [id number] Date of birth: 10/02/1965 Age: 52 y.o. Gender: male  Primary Care Provider: Patient, No Pcp Per Consultants: None Code Status: DNR  Pt Overview and Major Events to Date:  [name] is a 52 y.o. male presenting with chest pain, shortness of breath. PMH is significant for HIV (last viral count 3/24 undetectable and CD4 count 280, follows with Dr. Daiva EvesVan Dam), asthma, GERD, hx of PE, aseptic necrosis of R femur, OA of bilateral hips.   Assessment and Plan:  PNA with pneumococcal bacteremia Remains afebrile, SORA. CXR shows no change. Blood cultures show pneumococcal bacteremia. WBC stable at 11.4.  - Continues on Levaquin 750mg  x2 weeks per ID recommendations - follow blood cultures, CBC, fever curve - mucinex  H/o of Asthma. Reports worse after last pneumonia. Not on any controller. Becomes SOB after coughing. No wheezes or crackles appreciated on exam -albuterol PRN  - tessalon   H/o malnutrition and hypoalbuminemia Albumin low at 2.2. -Appreciate Nutrition consult  Chest pain:  Likely due to cough PNA. Troponin <0.03>0.05><0.03, Repeat EKG showed NSR. Consistent with pleuritic chest pain. - cont to monitor -mucinex and tessalon perles  HIV: stable, cont to monitor clinically  - continue home meds  Hypokalemia: 3.4 this AM. S/p K run 10meq x6 yesterday afternoon, repeat K 2.8, refused midnight K run, received 40meq Kdur. - will replete with additional 40meq Kdur   Hypomagnasemia. Mild @ 1.7. Will replete x1 with 2g IV  AKI: Cr 1.3. Baseline ~1.1.  -monitor on daily BMP  Anemia: stable, mild at Hgb 10.0 - monitor CBC,  - consider iron studies outpatient  Lung nodules: 13mm RUL and RLL nodules, needs outpatient followup.  -outpatient CT chest in 3 months   Depression: stable, cont to monitor -Monitor  Bilateral hip  OA: stable -tylenol prn  GERD: cont PPI  FEN/GI: regular diet, home PPI Prophylaxis: lovenox  Disposition: likely home today  Subjective:  Patient continues to feel SOB, states he doesn't feel a change in his status since admission. Reports SOB when walking to bathroom.  Objective: Temp:  [98.3 F (36.8 C)-98.6 F (37 C)] 98.5 F (36.9 C) (04/03 0540) Pulse Rate:  [77-85] 79 (04/03 0540) Resp:  [17-20] 20 (04/03 0540) BP: (116-137)/(64-78) 125/76 (04/03 0540) SpO2:  [96 %-99 %] 99 % (04/03 0540) Weight:  [194 lb 7.1 oz (88.2 kg)] 194 lb 7.1 oz (88.2 kg) (04/02 2151) Physical Exam: General: NAD, lying in bed CVS: RRR, no MRG Lungs: CTAB bilaterally, no wheezes or crackles appreciated. Normal WOB at rest. Some rib pain on palpation Abdomen: Soft, nontender, nondistended MSK: No lower extremity edema, 2+ dp  Laboratory: Recent Labs  Lab 06/14/17 0700 06/15/17 0746 06/16/17 0731  WBC 17.5* 11.6* 11.0*  HGB 11.7* 10.1* 9.8*  HCT 33.9* 29.8* 29.0*  PLT 170 166 156   Recent Labs  Lab 06/14/17 0700 06/15/17 0746  06/16/17 0731 06/16/17 1410 06/16/17 1842  NA 137 140  --  138 135 135  K 2.9* 2.5*   < > 2.5* 2.8* 2.8*  CL 110 114*  --  112* 110 110  CO2 17* 18*  --  20* 18* 17*  BUN 17 16  --  16 15 16   CREATININE 1.28* 1.26*  --  1.30* 1.21 1.36*  CALCIUM 8.6* 8.4*  --  8.3* 8.1* 8.2*  PROT 7.2 6.1*  --   --   --   --  BILITOT 1.0 1.1  --   --   --   --   ALKPHOS 105 95  --   --   --   --   ALT 27 30  --   --   --   --   AST 28 25  --   --   --   --   GLUCOSE 100* 92  --  109* 117* 130*   < > = values in this interval not displayed.    Imaging/Diagnostic Tests: Dg Chest 2 View  Result Date: 06/14/2017 CLINICAL DATA:  Cough, chest pain and shortness of breath for 4 days. EXAM: CHEST - 2 VIEW COMPARISON:  09/19/2009 and prior chest radiographs FINDINGS: Airspace disease/consolidation involving much of the LEFT LOWER lobe identified likely representing  pneumonia. Mild ill-defined opacities within the mid and lower RIGHT lung may represent developing airspace disease/pneumonia. Cardiomediastinal silhouette is otherwise unchanged. There may be a trace LEFT pleural effusion present. There is no evidence of pneumothorax or acute bony abnormality. IMPRESSION: Airspace disease/consolidation involving the majority of the LEFT LOWER lobe likely representing pneumonia. Mild opacities within the mid and lower RIGHT lung which may represent developing airspace disease/pneumonia. Radiographic follow-up to resolution is recommended. Electronically Signed   By: Harmon Pier M.D.   On: 06/14/2017 07:42   Ct Angio Chest Pe W And/or Wo Contrast  Result Date: 06/14/2017 CLINICAL DATA:  Acute generalized abdominal pain EXAM: CT ANGIOGRAPHY CHEST CT ABDOMEN AND PELVIS WITH CONTRAST TECHNIQUE: Multidetector CT imaging of the chest was performed using the standard protocol during bolus administration of intravenous contrast. Multiplanar CT image reconstructions and MIPs were obtained to evaluate the vascular anatomy. Multidetector CT imaging of the abdomen and pelvis was performed using the standard protocol during bolus administration of intravenous contrast. CONTRAST:  ISOVUE-370 IOPAMIDOL (ISOVUE-370) INJECTION 76% COMPARISON:  None. FINDINGS: CTA CHEST FINDINGS Cardiovascular: Satisfactory opacification of the pulmonary arteries to the segmental level. No evidence of pulmonary embolism. Normal heart size. No pericardial effusion. Mediastinum/Nodes: Enlarged subcarinal lymph node measuring 13 mm. Mild left hilar lymphadenopathy likely reactive. No axillary lymphadenopathy. Normal trachea and esophagus. Normal thyroid gland. Lungs/Pleura: Dense left lower lobe consolidation. 13 mm right lower lobe pulmonary nodule. 13 mm right upper lobe pulmonary nodule. Hazy airspace disease in the right upper lobe. Small left pleural effusion. No pneumothorax. Musculoskeletal: No acute osseous  abnormality. No aggressive osseous lesion. Review of the MIP images confirms the above findings. CT ABDOMEN and PELVIS FINDINGS Hepatobiliary: No focal liver abnormality is seen. No gallstones, gallbladder wall thickening, or biliary dilatation. Pancreas: Unremarkable. No pancreatic ductal dilatation or surrounding inflammatory changes. Spleen: Multiple calcifications in the spleen as can be seen with prior granulomatous disease. Adrenals/Urinary Tract: Adrenal glands are unremarkable. No urolithiasis or obstructive uropathy. 18 mm hypodense, fluid attenuating right interpolar renal mass most consistent with a cyst. Bladder is unremarkable. Stomach/Bowel: Small hiatal hernia. Stomach is otherwise within normal limits. No evidence of bowel wall thickening, distention, or inflammatory changes. Vascular/Lymphatic: Normal caliber abdominal aorta. Bilateral periaortic calcified lymph node as can be seen with prior granulomatous disease. Reproductive: Prostate is unremarkable. Other: No abdominal wall hernia or abnormality. No abdominopelvic ascites. Musculoskeletal: No aggressive osseous lesion. Left total hip arthroplasty. No acute osseous abnormality. Severe osteoarthritis of the right hip. Review of the MIP images confirms the above findings. IMPRESSION: 1. No pulmonary embolus. 2. Dense left lower lobe pneumonia. 3. Mild hazy right upper lobe airspace disease concerning for pneumonia. 4. 13 mm right upper  lobe and right lower lobe pulmonary nodules which may be secondary to an inflammatory or neoplastic etiology. Recommend follow-up CT of the chest in 3 months. Electronically Signed   By: Elige Ko   On: 06/14/2017 12:41   Ct Abdomen Pelvis W Contrast  Result Date: 06/14/2017 CLINICAL DATA:  Acute generalized abdominal pain EXAM: CT ANGIOGRAPHY CHEST CT ABDOMEN AND PELVIS WITH CONTRAST TECHNIQUE: Multidetector CT imaging of the chest was performed using the standard protocol during bolus administration of  intravenous contrast. Multiplanar CT image reconstructions and MIPs were obtained to evaluate the vascular anatomy. Multidetector CT imaging of the abdomen and pelvis was performed using the standard protocol during bolus administration of intravenous contrast. CONTRAST:  ISOVUE-370 IOPAMIDOL (ISOVUE-370) INJECTION 76% COMPARISON:  None. FINDINGS: CTA CHEST FINDINGS Cardiovascular: Satisfactory opacification of the pulmonary arteries to the segmental level. No evidence of pulmonary embolism. Normal heart size. No pericardial effusion. Mediastinum/Nodes: Enlarged subcarinal lymph node measuring 13 mm. Mild left hilar lymphadenopathy likely reactive. No axillary lymphadenopathy. Normal trachea and esophagus. Normal thyroid gland. Lungs/Pleura: Dense left lower lobe consolidation. 13 mm right lower lobe pulmonary nodule. 13 mm right upper lobe pulmonary nodule. Hazy airspace disease in the right upper lobe. Small left pleural effusion. No pneumothorax. Musculoskeletal: No acute osseous abnormality. No aggressive osseous lesion. Review of the MIP images confirms the above findings. CT ABDOMEN and PELVIS FINDINGS Hepatobiliary: No focal liver abnormality is seen. No gallstones, gallbladder wall thickening, or biliary dilatation. Pancreas: Unremarkable. No pancreatic ductal dilatation or surrounding inflammatory changes. Spleen: Multiple calcifications in the spleen as can be seen with prior granulomatous disease. Adrenals/Urinary Tract: Adrenal glands are unremarkable. No urolithiasis or obstructive uropathy. 18 mm hypodense, fluid attenuating right interpolar renal mass most consistent with a cyst. Bladder is unremarkable. Stomach/Bowel: Small hiatal hernia. Stomach is otherwise within normal limits. No evidence of bowel wall thickening, distention, or inflammatory changes. Vascular/Lymphatic: Normal caliber abdominal aorta. Bilateral periaortic calcified lymph node as can be seen with prior granulomatous disease.  Reproductive: Prostate is unremarkable. Other: No abdominal wall hernia or abnormality. No abdominopelvic ascites. Musculoskeletal: No aggressive osseous lesion. Left total hip arthroplasty. No acute osseous abnormality. Severe osteoarthritis of the right hip. Review of the MIP images confirms the above findings. IMPRESSION: 1. No pulmonary embolus. 2. Dense left lower lobe pneumonia. 3. Mild hazy right upper lobe airspace disease concerning for pneumonia. 4. 13 mm right upper lobe and right lower lobe pulmonary nodules which may be secondary to an inflammatory or neoplastic etiology. Recommend follow-up CT of the chest in 3 months. Electronically Signed   By: Elige Ko   On: 06/14/2017 12:41    Ellwood Dense, DO 06/17/2017, 7:15 AM PGY-1, [facility] FPTS Intern pager: [phone], text pages welcome

## 2017-06-17 NOTE — Progress Notes (Signed)
SATURATION QUALIFICATIONS: (This note is used to comply with regulatory documentation for home oxygen)  Patient Saturations on Room Air at Rest = 95%  Patient Saturations on Room Air while Ambulating = 94%  Please briefly explain why patient needs home oxygen: Pt sats maintained at 94-95% on RA throughout gait, despite notable SOB. Did not require supplemental oxygen to maintain.   Gladys DammeBrittany Netty Sullivant, PT, DPT  Acute Rehabilitation Services  Pager: [phone]

## 2017-06-18 LAB — BASIC METABOLIC PANEL
ANION GAP: 7 (ref 5–15)
BUN: 14 mg/dL (ref 6–20)
CO2: 20 mmol/L — AB (ref 22–32)
Calcium: 8.4 mg/dL — ABNORMAL LOW (ref 8.9–10.3)
Chloride: 108 mmol/L (ref 101–111)
Creatinine, Ser: 0.99 mg/dL (ref 0.61–1.24)
GFR calc non Af Amer: >60 mL/min (ref >60)
GLUCOSE: 104 mg/dL — AB (ref 65–99)
Potassium: 3.6 mmol/L (ref 3.5–5.1)
Sodium: 135 mmol/L (ref 135–145)

## 2017-06-18 LAB — CBC
HCT: 29.9 % — ABNORMAL LOW (ref 39.0–52.0)
HEMOGLOBIN: 10 g/dL — AB (ref 13.0–17.0)
MCH: 26.5 pg (ref 26.0–34.0)
MCHC: 33.4 g/dL (ref 30.0–36.0)
MCV: 79.1 fL (ref 78.0–100.0)
Platelets: 166 10*3/uL (ref 150–400)
RBC: 3.78 MIL/uL — AB (ref 4.22–5.81)
RDW: 17.8 % — ABNORMAL HIGH (ref 11.5–15.5)
WBC: 10.9 10*3/uL — ABNORMAL HIGH (ref 4.0–10.5)

## 2017-06-18 MED ORDER — ENSURE ENLIVE PO LIQD: 237.0000 mL | Freq: Two times a day (BID) | ORAL | 12 refills | Status: DC

## 2017-06-18 MED ORDER — PANTOPRAZOLE SODIUM 40 MG PO TBEC: 40.0000 mg | DELAYED_RELEASE_TABLET | Freq: Every day | ORAL | 0 refills | Status: DC

## 2017-06-18 MED ORDER — BENZONATATE 200 MG PO CAPS: 200.0000 mg | ORAL_CAPSULE | Freq: Three times a day (TID) | ORAL | 0 refills | Status: DC

## 2017-06-18 MED ORDER — FOLIC ACID 1 MG PO TABS: 1.0000 mg | ORAL_TABLET | Freq: Every day | ORAL | 0 refills | Status: DC

## 2017-06-18 MED ORDER — IBUPROFEN 800 MG PO TABS: 800.0000 mg | ORAL_TABLET | Freq: Three times a day (TID) | ORAL | 0 refills | Status: DC | PRN

## 2017-06-18 MED ORDER — ADULT MULTIVITAMIN W/MINERALS CH: 1.0000 | ORAL_TABLET | Freq: Every day | ORAL | 0 refills | Status: DC

## 2017-06-18 MED ORDER — LIDOCAINE 5 % EX PTCH: 1.0000 | MEDICATED_PATCH | CUTANEOUS | 0 refills | Status: DC

## 2017-06-18 MED ORDER — AMOXICILLIN 500 MG PO CAPS
500.0000 mg | ORAL_CAPSULE | Freq: Three times a day (TID) | ORAL | Status: DC
  Administered 2017-06-18: 500 mg via ORAL
  Filled 2017-06-18: qty 1

## 2017-06-18 MED ORDER — ALBUTEROL SULFATE HFA 108 (90 BASE) MCG/ACT IN AERS: 2.0000 | INHALATION_SPRAY | Freq: Four times a day (QID) | RESPIRATORY_TRACT | 2 refills | Status: DC | PRN

## 2017-06-18 MED ORDER — AMOXICILLIN 500 MG PO CAPS: 500.0000 mg | ORAL_CAPSULE | Freq: Three times a day (TID) | ORAL | 0 refills | Status: DC

## 2017-06-18 MED ORDER — DM-GUAIFENESIN ER 30-600 MG PO TB12: 1.0000 | ORAL_TABLET | Freq: Two times a day (BID) | ORAL | 0 refills | Status: AC

## 2017-06-18 NOTE — Evaluation (Signed)
Occupational Therapy Evaluation and Discharge Patient Details Name: [name] MRN: [id number] DOB: 12/10/1965 Today's Date: 06/18/2017    History of Present Illness Pt is a 52 y/o male admitted seocondary to chest pain and SOB. CT of chest negative for PE, however, showed dense L lower lobe pneumonia, and R upper/lower lobe pulmonary nodules. PMH includes HIV, asthma, PE, and aseptic necrosis of R femur.    Clinical Impression   Pt with mildly unsteady due to R hip. Requesting cane. Pt is independent in self care. No OT needs.    Follow Up Recommendations  No OT follow up    Equipment Recommendations  (cane)    Recommendations for Other Services       Precautions / Restrictions Precautions Precautions: None Restrictions Weight Bearing Restrictions: No      Mobility Bed Mobility Overal bed mobility: Modified Independent             General bed mobility comments: HOB up  Transfers Overall transfer level: Modified independent Equipment used: None                  Balance     Sitting balance-Leahy Scale: Normal       Standing balance-Leahy Scale: Fair                             ADL either performed or assessed with clinical judgement   ADL Overall ADL's : Independent                                       General ADL Comments: pt reports no difficulty showering this morning, pt with some mild instability, appropriate for a cane, pt is agreeable     Vision Baseline Vision/History: Wears glasses Wears Glasses: Reading only Patient Visual Report: No change from baseline       Perception     Praxis      Pertinent Vitals/Pain Pain Assessment: No/denies pain     Hand Dominance Right   Extremity/Trunk Assessment Upper Extremity Assessment Upper Extremity Assessment: Overall WFL for tasks assessed   Lower Extremity Assessment Lower Extremity Assessment: RLE deficits/detail RLE Deficits / Details: 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   Cervical / Trunk Assessment Cervical / Trunk Assessment: Normal   Communication Communication Communication: No difficulties   Cognition Arousal/Alertness: Awake/alert Behavior During Therapy: WFL for tasks assessed/performed Overall Cognitive Status: Within Functional Limits for tasks assessed                                     General Comments       Exercises     Shoulder Instructions      Home Living Family/patient expects to be discharged to:: Private residence Living Arrangements: Spouse/significant other Available Help at Discharge: Family;Available 24 hours/day Type of Home: House Home Access: Stairs to enter Entergy CorporationEntrance Stairs-Number of Steps: 4 Entrance Stairs-Rails: Left Home Layout: One level     Bathroom Shower/Tub: Chief Strategy OfficerTub/shower unit   Bathroom Toilet: Standard     Home Equipment: None          Prior Functioning/Environment Level of Independence: Independent                 OT Problem List:        OT  Treatment/Interventions:      OT Goals(Current goals can be found in the care plan section) Acute Rehab OT Goals Patient Stated Goal: to go home   OT Frequency:     Barriers to D/C:            Co-evaluation              AM-PAC PT "6 Clicks" Daily Activity     Outcome Measure Help from another person eating meals?: None Help from another person taking care of personal grooming?: None Help from another person toileting, which includes using toliet, bedpan, or urinal?: None Help from another person bathing (including washing, rinsing, drying)?: None Help from another person to put on and taking off regular upper body clothing?: None Help from another person to put on and taking off regular lower body clothing?: None 6 Click Score: 24   End of Session Equipment Utilized During Treatment: Gait belt Nurse Communication: Other (comment)(needs a cane)  Activity Tolerance: Patient tolerated treatment  well Patient left: in bed;with call bell/phone within reach;with family/visitor present  OT Visit Diagnosis: Unsteadiness on feet (R26.81)                Time: 1610-9604 OT Time Calculation (min): 13 min Charges:  OT General Charges $OT Visit: 1 Visit OT Evaluation $OT Eval Low Complexity: 1 Low G-Codes:     06/25/2017 Martie Round, OTR/L Pager: [phone]  Iran Planas, [place] Bailiff 06-25-17, 10:17 AM

## 2017-06-18 NOTE — Progress Notes (Signed)
[facility] liaison, arranged for delivery prior to discharge.

## 2017-06-18 NOTE — Care Management Important Message (Signed)
Important Message  Patient Details  Name: Joseph ReekLarry D Meloy MRN: [id number] Date of Birth: 11/08/1965   Medicare Important Message Given:  Yes    Torryn Fiske 06/18/2017, 11:00 AM

## 2017-06-18 NOTE — Progress Notes (Signed)
PT Cancellation Note  Patient Details Name: Joseph ReekLarry D Whitfill MRN: [id number] DOB: 10/13/1965   Cancelled Treatment:    Reason Eval/Treat Not Completed: Other (comment). Pt currently in shower. Pt's family member reporting that pt is to d/c home today. PT will continue to f/u with pt as available.    Alessandra BevelsJennifer M Sabirin Baray 06/18/2017, 8:55 AM

## 2017-06-18 NOTE — Progress Notes (Signed)
Family Medicine Teaching Service Daily Progress Note Intern Pager: [phone]  Patient name: [name] Medical record number: [id number] Date of birth: 12/05/1965 Age: 52 y.o. Gender: male  Primary Care Provider: Patient, No Pcp Per Consultants: None Code Status: DNR  Pt Overview and Major Events to Date:  [name] is a 52 y.o. male presenting with chest pain, shortness of breath. PMH is significant for HIV (last viral count 3/24 undetectable and CD4 count 280, follows with Dr. Daiva EvesVan Dam), asthma, GERD, hx of PE, aseptic necrosis of R femur, OA of bilateral hips.   Assessment and Plan:  PNA with pneumococcal bacteremia Remains afebrile, SORA. CXR shows no change. Blood cultures show pneumococcal bacteremia. WBC stable at 11.  - Continues on Levaquin 750mg  x2 weeks per ID recommendations - mucinex -Echo cardiogram 60-65%, no wall motion or valvular abnormalities  H/o of Asthma, improved -albuterol PRN  - tessalon   H/o malnutrition and hypoalbuminemia -on nutrition supplaments  Chest pain: improved Likely due to cough PNA.  -mucinex and tessalon perles -ibuprofen and tylenol, lidocaine patch  HIV: stable, cont to monitor clinically  - continue home meds  Hypokalemia, resolved 3.6 on 4/4.  Hypomagnasemia. Mild @ 1.7. Will replete x1 with 2g IV  AKI: resolved. Cr 1.05 -monitor on daily BMP  Anemia: stable, mild at Hgb 10.0 - monitor CBC,  - consider iron studies outpatient after resolution   Lung nodules: 13mm RUL and RLL nodules, needs outpatient followup.  -outpatient CT chest in 3 months   Depression: stable, cont to monitor -Monitor  Bilateral hip OA: stable -tylenol prn  GERD: cont PPI  FEN/GI: regular diet, home PPI Prophylaxis: lovenox  Disposition: likely home today  Subjective:  Patient continues to feel SOB, states he doesn't feel a change in his status since admission. Reports SOB when walking to bathroom.  Objective: Temp:   [98.3 F (36.8 C)-98.9 F (37.2 C)] 98.9 F (37.2 C) (04/04 0514) Pulse Rate:  [75-83] 75 (04/04 0514) Resp:  [18-21] 20 (04/04 0514) BP: (105-115)/(65-76) 114/76 (04/04 0514) SpO2:  [98 %-100 %] 99 % (04/04 0514) Weight:  [194 lb 10.7 oz (88.3 kg)] 194 lb 10.7 oz (88.3 kg) (04/03 2115) Physical Exam: General: NAD, lying in bed CVS: RRR, no MRG Lungs: diffuse crackles in LLL, no wheezes. Normal WOB at rest. Some rib pain on palpation Abdomen: Soft, nontender, nondistended MSK: No lower extremity edema, 2+ dp  Laboratory: Recent Labs  Lab 06/15/17 0746 06/16/17 0731 06/17/17 0702  WBC 11.6* 11.0* 11.4*  HGB 10.1* 9.8* 10.0*  HCT 29.8* 29.0* 29.2*  PLT 166 156 155   Recent Labs  Lab 06/14/17 0700 06/15/17 0746  06/16/17 1410 06/16/17 1842 06/17/17 0702  NA 137 140   < > 135 135 137  K 2.9* 2.5*   < > 2.8* 2.8* 3.4*  CL 110 114*   < > 110 110 112*  CO2 17* 18*   < > 18* 17* 17*  BUN 17 16   < > 15 16 13   CREATININE 1.28* 1.26*   < > 1.21 1.36* 1.05  CALCIUM 8.6* 8.4*   < > 8.1* 8.2* 8.3*  PROT 7.2 6.1*  --   --   --   --   BILITOT 1.0 1.1  --   --   --   --   ALKPHOS 105 95  --   --   --   --   ALT 27 30  --   --   --   --  AST 28 25  --   --   --   --   GLUCOSE 100* 92   < > 117* 130* 120*   < > = values in this interval not displayed.    Garnette Gunner, MD 06/18/2017, 6:59 AM PGY-1, [facility] Family Medicine FPTS Intern pager: [phone], text pages welcome

## 2017-06-18 NOTE — Progress Notes (Signed)
[name] to be D/C'd Home per MD order.  Discussed prescriptions and follow up appointments with the patient. Prescriptions given to patient, medication list explained in detail. Pt verbalized understanding.  Allergies as of 06/18/2017      Reactions   Sulfa Antibiotics Rash   Tramadol Nausea Only      Medication List    TAKE these medications   albuterol 108 (90 Base) MCG/ACT inhaler Commonly known as:  PROVENTIL HFA;VENTOLIN HFA Inhale 2 puffs into the lungs every 6 (six) hours as needed for wheezing or shortness of breath.   amoxicillin 500 MG capsule Commonly known as:  AMOXIL Take 1 capsule (500 mg total) by mouth every 8 (eight) hours for 12 days.   benzonatate 200 MG capsule Commonly known as:  TESSALON Take 1 capsule (200 mg total) by mouth 3 (three) times daily.   Darunavir-Cobicisctat-Emtricitabine-Tenofovir Alafenamide 800-150-200-10 MG Tabs Commonly known as:  SYMTUZA Take 1 tablet by mouth daily with breakfast.   dextromethorphan-guaiFENesin 30-600 MG 12hr tablet Commonly known as:  MUCINEX DM Take 1 tablet by mouth 2 (two) times daily.   dolutegravir 50 MG tablet Commonly known as:  TIVICAY Take 1 tablet (50 mg total) by mouth daily.   feeding supplement (ENSURE ENLIVE) Liqd Take 237 mLs by mouth 2 (two) times daily between meals.   fluconazole 200 MG tablet Commonly known as:  DIFLUCAN Take 1 tablet (200 mg total) by mouth daily.   folic acid 1 MG tablet Commonly known as:  FOLVITE Take 1 tablet (1 mg total) by mouth daily.   gabapentin 100 MG capsule Commonly known as:  NEURONTIN Take 1 capsule (100 mg total) by mouth 3 (three) times daily.   ibuprofen 800 MG tablet Commonly known as:  ADVIL,MOTRIN Take 1 tablet (800 mg total) by mouth every 8 (eight) hours as needed for moderate pain. What changed:    medication strength  when to take this  reasons to take this  Another medication with the same name was removed. Continue taking this  medication, and follow the directions you see here.   lidocaine 5 % Commonly known as:  LIDODERM Place 1 patch onto the skin daily. Remove & Discard patch within 12 hours or as directed by MD   multivitamin with minerals Tabs tablet Take 1 tablet by mouth daily.   oxyCODONE-acetaminophen 5-325 MG tablet Commonly known as:  PERCOCET/ROXICET Take 1 tablet by mouth every 6 (six) hours as needed for severe pain.   pantoprazole 40 MG tablet Commonly known as:  PROTONIX Take 1 tablet (40 mg total) by mouth daily.   valACYclovir 500 MG tablet Commonly known as:  VALTREX Take 1 tablet (500 mg total) by mouth daily.            Durable Medical Equipment  (From admission, onward)        Start     Ordered   06/18/17 1009  For home use only DME Cane  Once     06/18/17 1008      Vitals:   06/18/17 0818 06/18/17 0838  BP: (!) 113/59   Pulse: 84   Resp: 18   Temp: (!) 97.4 F (36.3 C)   SpO2: 99% 98%    Skin clean, dry and intact without evidence of skin break down, no evidence of skin tears noted. IV catheter discontinued intact. Site without signs and symptoms of complications. Dressing and pressure applied. Pt denies pain at this time. No complaints noted.  An After Visit  Summary was printed and given to the patient. Patient escorted via WC, and D/C home via private auto.  Nelma RothmanNatalie Tuesday Terlecki, RN [facility] Phone [phone]

## 2017-06-18 NOTE — Discharge Instructions (Signed)

## 2017-06-18 NOTE — Care Management Note (Addendum)
Case Management Note  Patient Details  Name: [name] MRN: [id number] Date of Birth: 05/28/1965  Expected Discharge Date:  06/18/17               Expected Discharge Plan:  Home/Self Care In-House Referral:  Nutrition Discharge planning Services  CM Consult Post Acute Care Choice:  Durable Medical Equipment Choice offered to:  Patient DME Arranged:  Gilmer Morane, Patient refused services DME Agency:  [facility]  Status of Service:  Completed, signed off  Notified by staff nurse patient has been discharged before cane could be delivered.  Stated patient is going to purchase himself.  Yancey FlemingsKimberly R Becton, RN  Nurse case manager [place] 06/18/2017, 12:18 PM

## 2017-06-21 ENCOUNTER — Other Ambulatory Visit: Payer: Self-pay

## 2017-06-21 ENCOUNTER — Emergency Department (HOSPITAL_COMMUNITY): Payer: Medicare Other

## 2017-06-21 ENCOUNTER — Encounter (HOSPITAL_COMMUNITY): Payer: Self-pay

## 2017-06-21 ENCOUNTER — Inpatient Hospital Stay (HOSPITAL_COMMUNITY)
Admission: EM | Admit: 2017-06-21 | Discharge: 2017-06-30 | DRG: 177 | Disposition: A | Discharge status: Home / Self Care | Payer: Medicare Other | Attending: Internal Medicine | Admitting: Internal Medicine

## 2017-06-21 DIAGNOSIS — E876 Hypokalemia: Secondary | ICD-10-CM | POA: Diagnosis present

## 2017-06-21 DIAGNOSIS — Z720 Tobacco use: Secondary | ICD-10-CM | POA: Diagnosis not present

## 2017-06-21 DIAGNOSIS — J851 Abscess of lung with pneumonia: Principal | ICD-10-CM

## 2017-06-21 DIAGNOSIS — J90 Pleural effusion, not elsewhere classified: Secondary | ICD-10-CM

## 2017-06-21 DIAGNOSIS — F17220 Nicotine dependence, chewing tobacco, uncomplicated: Secondary | ICD-10-CM | POA: Diagnosis not present

## 2017-06-21 DIAGNOSIS — K219 Gastro-esophageal reflux disease without esophagitis: Secondary | ICD-10-CM | POA: Diagnosis present

## 2017-06-21 DIAGNOSIS — J181 Lobar pneumonia, unspecified organism: Secondary | ICD-10-CM | POA: Diagnosis not present

## 2017-06-21 DIAGNOSIS — D509 Iron deficiency anemia, unspecified: Secondary | ICD-10-CM | POA: Diagnosis not present

## 2017-06-21 DIAGNOSIS — Z8619 Personal history of other infectious and parasitic diseases: Secondary | ICD-10-CM | POA: Diagnosis not present

## 2017-06-21 DIAGNOSIS — I10 Essential (primary) hypertension: Secondary | ICD-10-CM

## 2017-06-21 DIAGNOSIS — J45909 Unspecified asthma, uncomplicated: Secondary | ICD-10-CM | POA: Diagnosis present

## 2017-06-21 DIAGNOSIS — Z21 Asymptomatic human immunodeficiency virus [HIV] infection status: Secondary | ICD-10-CM | POA: Diagnosis present

## 2017-06-21 DIAGNOSIS — Z8249 Family history of ischemic heart disease and other diseases of the circulatory system: Secondary | ICD-10-CM

## 2017-06-21 DIAGNOSIS — Z66 Do not resuscitate: Secondary | ICD-10-CM | POA: Diagnosis present

## 2017-06-21 DIAGNOSIS — R0602 Shortness of breath: Secondary | ICD-10-CM

## 2017-06-21 DIAGNOSIS — K921 Melena: Secondary | ICD-10-CM | POA: Diagnosis not present

## 2017-06-21 DIAGNOSIS — R778 Other specified abnormalities of plasma proteins: Secondary | ICD-10-CM | POA: Diagnosis present

## 2017-06-21 DIAGNOSIS — B20 Human immunodeficiency virus [HIV] disease: Secondary | ICD-10-CM | POA: Diagnosis present

## 2017-06-21 DIAGNOSIS — R7881 Bacteremia: Secondary | ICD-10-CM | POA: Diagnosis not present

## 2017-06-21 DIAGNOSIS — Z86711 Personal history of pulmonary embolism: Secondary | ICD-10-CM | POA: Diagnosis not present

## 2017-06-21 DIAGNOSIS — R05 Cough: Secondary | ICD-10-CM | POA: Diagnosis not present

## 2017-06-21 DIAGNOSIS — J852 Abscess of lung without pneumonia: Secondary | ICD-10-CM | POA: Diagnosis present

## 2017-06-21 DIAGNOSIS — Z885 Allergy status to narcotic agent status: Secondary | ICD-10-CM | POA: Diagnosis not present

## 2017-06-21 DIAGNOSIS — J13 Pneumonia due to Streptococcus pneumoniae: Secondary | ICD-10-CM | POA: Diagnosis present

## 2017-06-21 DIAGNOSIS — Z882 Allergy status to sulfonamides status: Secondary | ICD-10-CM | POA: Diagnosis not present

## 2017-06-21 HISTORY — DX: Pneumonia due to other streptococci: J15.4

## 2017-06-21 LAB — RAPID URINE DRUG SCREEN, HOSP PERFORMED
Amphetamines: NOT DETECTED
BENZODIAZEPINES: NOT DETECTED
Barbiturates: NOT DETECTED
Cocaine: NOT DETECTED
Opiates: NOT DETECTED
Tetrahydrocannabinol: NOT DETECTED

## 2017-06-21 LAB — COMPREHENSIVE METABOLIC PANEL
ALK PHOS: 120 U/L (ref 38–126)
ALT: 27 U/L (ref 17–63)
AST: 20 U/L (ref 15–41)
Albumin: 3 g/dL — ABNORMAL LOW (ref 3.5–5.0)
Anion gap: 12 (ref 5–15)
BUN: 15 mg/dL (ref 6–20)
CALCIUM: 9.1 mg/dL (ref 8.9–10.3)
CHLORIDE: 106 mmol/L (ref 101–111)
CO2: 17 mmol/L — AB (ref 22–32)
CREATININE: 0.9 mg/dL (ref 0.61–1.24)
GFR calc Af Amer: >60 mL/min (ref >60)
Glucose, Bld: 98 mg/dL (ref 65–99)
Potassium: 3.2 mmol/L — ABNORMAL LOW (ref 3.5–5.1)
SODIUM: 135 mmol/L (ref 135–145)
Total Bilirubin: 0.6 mg/dL (ref 0.3–1.2)
Total Protein: 9.3 g/dL — ABNORMAL HIGH (ref 6.5–8.1)

## 2017-06-21 LAB — TROPONIN I
Troponin I: 0.14 ng/mL (ref <0.03)
Troponin I: <0.03 ng/mL (ref <0.03)

## 2017-06-21 LAB — CBC
HCT: 33.6 % — ABNORMAL LOW (ref 39.0–52.0)
HEMOGLOBIN: 11.4 g/dL — AB (ref 13.0–17.0)
MCH: 26.8 pg (ref 26.0–34.0)
MCHC: 33.9 g/dL (ref 30.0–36.0)
MCV: 78.9 fL (ref 78.0–100.0)
Platelets: 407 10*3/uL — ABNORMAL HIGH (ref 150–400)
RBC: 4.26 MIL/uL (ref 4.22–5.81)
RDW: 16.7 % — ABNORMAL HIGH (ref 11.5–15.5)
WBC: 21.6 10*3/uL — AB (ref 4.0–10.5)

## 2017-06-21 LAB — INFLUENZA PANEL BY PCR (TYPE A & B)
INFLAPCR: NEGATIVE
Influenza B By PCR: NEGATIVE

## 2017-06-21 MED ORDER — KETOROLAC TROMETHAMINE 30 MG/ML IJ SOLN
30.0000 mg | Freq: Four times a day (QID) | INTRAMUSCULAR | Status: AC | PRN
  Administered 2017-06-21 – 2017-06-25 (×12): 30 mg via INTRAVENOUS
  Filled 2017-06-21 (×14): qty 1

## 2017-06-21 MED ORDER — FLUCONAZOLE 200 MG PO TABS
200.0000 mg | ORAL_TABLET | Freq: Every day | ORAL | Status: DC
  Administered 2017-06-21 – 2017-06-30 (×10): 200 mg via ORAL
  Filled 2017-06-21 (×2): qty 1
  Filled 2017-06-21 (×2): qty 2
  Filled 2017-06-21 (×2): qty 1
  Filled 2017-06-21 (×2): qty 2
  Filled 2017-06-21: qty 1
  Filled 2017-06-21 (×3): qty 2
  Filled 2017-06-21: qty 1
  Filled 2017-06-21: qty 2
  Filled 2017-06-21: qty 1
  Filled 2017-06-21 (×2): qty 2
  Filled 2017-06-21 (×3): qty 1

## 2017-06-21 MED ORDER — POTASSIUM CHLORIDE CRYS ER 20 MEQ PO TBCR
40.0000 meq | EXTENDED_RELEASE_TABLET | Freq: Once | ORAL | Status: AC
  Administered 2017-06-21: 40 meq via ORAL
  Filled 2017-06-21: qty 2

## 2017-06-21 MED ORDER — ENOXAPARIN SODIUM 40 MG/0.4ML ~~LOC~~ SOLN
40.0000 mg | SUBCUTANEOUS | Status: DC
  Filled 2017-06-21 (×7): qty 0.4

## 2017-06-21 MED ORDER — NITROGLYCERIN 0.4 MG SL SUBL: 0.4000 mg | SUBLINGUAL_TABLET | SUBLINGUAL | Status: DC | PRN

## 2017-06-21 MED ORDER — ACETAMINOPHEN 650 MG RE SUPP: 650.0000 mg | Freq: Four times a day (QID) | RECTAL | Status: DC | PRN

## 2017-06-21 MED ORDER — GUAIFENESIN-DM 100-10 MG/5ML PO SYRP
5.0000 mL | ORAL_SOLUTION | ORAL | Status: DC | PRN
  Administered 2017-06-21 – 2017-06-26 (×17): 5 mL via ORAL
  Filled 2017-06-21 (×17): qty 10

## 2017-06-21 MED ORDER — IBUPROFEN 800 MG PO TABS
800.0000 mg | ORAL_TABLET | Freq: Three times a day (TID) | ORAL | Status: DC | PRN
  Administered 2017-06-26 – 2017-06-29 (×3): 800 mg via ORAL
  Filled 2017-06-21 (×3): qty 1

## 2017-06-21 MED ORDER — BISACODYL 5 MG PO TBEC: 5.0000 mg | DELAYED_RELEASE_TABLET | Freq: Every day | ORAL | Status: DC | PRN

## 2017-06-21 MED ORDER — DM-GUAIFENESIN ER 30-600 MG PO TB12
1.0000 | ORAL_TABLET | Freq: Two times a day (BID) | ORAL | Status: DC
  Administered 2017-06-21 – 2017-06-30 (×19): 1 via ORAL
  Filled 2017-06-21 (×19): qty 1

## 2017-06-21 MED ORDER — SODIUM CHLORIDE 0.9 % IV SOLN
3.0000 g | Freq: Four times a day (QID) | INTRAVENOUS | Status: DC
  Administered 2017-06-21 – 2017-06-30 (×35): 3 g via INTRAVENOUS
  Filled 2017-06-21 (×37): qty 3

## 2017-06-21 MED ORDER — IPRATROPIUM-ALBUTEROL 0.5-2.5 (3) MG/3ML IN SOLN
3.0000 mL | Freq: Three times a day (TID) | RESPIRATORY_TRACT | Status: DC
  Administered 2017-06-21 – 2017-06-25 (×11): 3 mL via RESPIRATORY_TRACT
  Filled 2017-06-21 (×11): qty 3

## 2017-06-21 MED ORDER — MAGNESIUM OXIDE 400 (241.3 MG) MG PO TABS
400.0000 mg | ORAL_TABLET | Freq: Two times a day (BID) | ORAL | Status: DC
  Administered 2017-06-21 – 2017-06-30 (×19): 400 mg via ORAL
  Filled 2017-06-21 (×19): qty 1

## 2017-06-21 MED ORDER — ONDANSETRON HCL 4 MG/2ML IJ SOLN
4.0000 mg | Freq: Four times a day (QID) | INTRAMUSCULAR | Status: DC | PRN
  Administered 2017-06-21: 4 mg via INTRAVENOUS
  Filled 2017-06-21: qty 2

## 2017-06-21 MED ORDER — IOHEXOL 300 MG/ML  SOLN
75.0000 mL | Freq: Once | INTRAMUSCULAR | Status: AC | PRN
  Administered 2017-06-21: 75 mL via INTRAVENOUS

## 2017-06-21 MED ORDER — IPRATROPIUM-ALBUTEROL 0.5-2.5 (3) MG/3ML IN SOLN: 3.0000 mL | Freq: Four times a day (QID) | RESPIRATORY_TRACT | Status: DC

## 2017-06-21 MED ORDER — ACETAMINOPHEN 325 MG PO TABS: 650.0000 mg | ORAL_TABLET | Freq: Four times a day (QID) | ORAL | Status: DC | PRN

## 2017-06-21 MED ORDER — POLYETHYLENE GLYCOL 3350 17 G PO PACK
17.0000 g | PACK | Freq: Every day | ORAL | Status: DC | PRN
  Filled 2017-06-21: qty 1

## 2017-06-21 MED ORDER — ONDANSETRON HCL 4 MG PO TABS: 4.0000 mg | ORAL_TABLET | Freq: Four times a day (QID) | ORAL | Status: DC | PRN

## 2017-06-21 MED ORDER — ASPIRIN EC 325 MG PO TBEC
325.0000 mg | DELAYED_RELEASE_TABLET | Freq: Every day | ORAL | Status: DC
  Administered 2017-06-21 – 2017-06-30 (×10): 325 mg via ORAL
  Filled 2017-06-21 (×10): qty 1

## 2017-06-21 MED ORDER — VANCOMYCIN HCL 10 G IV SOLR
1250.0000 mg | Freq: Two times a day (BID) | INTRAVENOUS | Status: DC
  Administered 2017-06-21 – 2017-06-29 (×16): 1250 mg via INTRAVENOUS
  Filled 2017-06-21 (×16): qty 1250

## 2017-06-21 MED ORDER — VANCOMYCIN HCL IN DEXTROSE 1-5 GM/200ML-% IV SOLN
1000.0000 mg | Freq: Once | INTRAVENOUS | Status: AC
  Administered 2017-06-21: 1000 mg via INTRAVENOUS
  Filled 2017-06-21: qty 200

## 2017-06-21 MED ORDER — DOLUTEGRAVIR SODIUM 50 MG PO TABS
50.0000 mg | ORAL_TABLET | Freq: Every day | ORAL | Status: DC
  Administered 2017-06-21 – 2017-06-30 (×10): 50 mg via ORAL
  Filled 2017-06-21 (×10): qty 1

## 2017-06-21 MED ORDER — ALBUTEROL SULFATE (2.5 MG/3ML) 0.083% IN NEBU
5.0000 mg | INHALATION_SOLUTION | Freq: Once | RESPIRATORY_TRACT | Status: AC
  Administered 2017-06-21: 5 mg via RESPIRATORY_TRACT
  Filled 2017-06-21: qty 6

## 2017-06-21 MED ORDER — DARUN-COBIC-EMTRICIT-TENOFAF 800-150-200-10 MG PO TABS
1.0000 | ORAL_TABLET | Freq: Every day | ORAL | Status: DC
  Administered 2017-06-21 – 2017-06-30 (×10): 1 via ORAL
  Filled 2017-06-21 (×14): qty 1

## 2017-06-21 MED ORDER — GABAPENTIN 100 MG PO CAPS
100.0000 mg | ORAL_CAPSULE | Freq: Three times a day (TID) | ORAL | Status: DC
  Administered 2017-06-21 – 2017-06-30 (×27): 100 mg via ORAL
  Filled 2017-06-21 (×27): qty 1

## 2017-06-21 MED ORDER — PIPERACILLIN-TAZOBACTAM 3.375 G IVPB 30 MIN
3.3750 g | Freq: Once | INTRAVENOUS | Status: AC
  Administered 2017-06-21: 3.375 g via INTRAVENOUS
  Filled 2017-06-21: qty 50

## 2017-06-21 MED ORDER — FERROUS SULFATE 325 (65 FE) MG PO TABS
325.0000 mg | ORAL_TABLET | Freq: Every day | ORAL | Status: DC
  Administered 2017-06-22 – 2017-06-30 (×9): 325 mg via ORAL
  Filled 2017-06-21 (×9): qty 1

## 2017-06-21 MED ORDER — SODIUM CHLORIDE 0.9 % IJ SOLN
INTRAMUSCULAR | Status: AC
  Administered 2017-06-21: 11:00:00
  Filled 2017-06-21: qty 50

## 2017-06-21 MED ORDER — VALACYCLOVIR HCL 500 MG PO TABS
500.0000 mg | ORAL_TABLET | Freq: Every day | ORAL | Status: DC
  Administered 2017-06-21 – 2017-06-30 (×10): 500 mg via ORAL
  Filled 2017-06-21 (×10): qty 1

## 2017-06-21 NOTE — Progress Notes (Signed)
Pharmacy Antibiotic Note  [name] is a 52 y.o. male with hx HIV and recently hospitalized for strep pneumococcal bacteremia/PNA.  He was discharged on 06/18/17 with 12 day course of augmentin. He presented back to the ED on 06/21/2017 with c/o SOB.  To start vancomycin and unasyn for PNA/lung abscess.  - 4/7 Chest CT: Left lower lobe pneumonia. There is now a large rounded collection within the infiltrate containing air-fluid levels consistent with necrosis/abscess formation. - afeb, wbc 21.6, scr 0.90 (crcl~100)   Plan: - unasyn 3gm IV q6h - vancomycin 1250 mg IV q12h for est AUC 460 (goal 400-500)  ___________________________________  Height: 6' (182.9 cm) Weight: 194 lb (88 kg) IBW/kg (Calculated) : 77.6  Temp (24hrs), Avg:98.5 F (36.9 C), Min:98.1 F (36.7 C), Max:98.8 F (37.1 C)  Recent Labs  Lab 06/15/17 0746 06/16/17 0731 06/16/17 1410 06/16/17 1842 06/17/17 0702 06/18/17 0613 06/21/17 0748  WBC 11.6* 11.0*  --   --  11.4* 10.9* 21.6*  CREATININE 1.26* 1.30* 1.21 1.36* 1.05 0.99 0.90    Estimated Creatinine Clearance: 109 mL/min (by C-G formula based on SCr of 0.9 mg/dL).    Allergies  Allergen Reactions  . Sulfa Antibiotics Rash  . Tramadol Nausea Only   Antimicrobials this admission:  4/7 zosyn x1 4/7 vanc>> 4/7 unasyn>>  Dose adjustments this admission:  --  Microbiology results:  3/31 bcx x2: 2/2 strep pneum (pans sens) 4/2 MRSA PCR: neg  4/7 BCx x2:    Thank you for allowing pharmacy to be a part of this patient's care.  Lucia Gaskinsham, Murad Staples P 06/21/2017 2:03 PM

## 2017-06-21 NOTE — ED Notes (Signed)
ED TO INPATIENT HANDOFF REPORT  Name/Age/Gender [name] 52 y.o. male  Code Status    Code Status Orders  (From admission, onward)        Start     Ordered   06/21/17 1348  Do not attempt resuscitation (DNR)  Continuous    Question Answer Comment  In the event of cardiac or respiratory ARREST Do not call a "code blue"   In the event of cardiac or respiratory ARREST Do not perform Intubation, CPR, defibrillation or ACLS   In the event of cardiac or respiratory ARREST Use medication by any route, position, wound care, and other measures to relive pain and suffering. May use oxygen, suction and manual treatment of airway obstruction as needed for comfort.      06/21/17 1347    Code Status History    Date Active Date Inactive Code Status Order ID Comments User Context   06/21/2017 1347 06/21/2017 1347 Full Code [id number]  Janece Canterbury, MD ED   06/14/2017 1658 06/18/2017 1500 DNR [id number]  Sela Hilding, MD Inpatient   01/30/2017 0031 02/01/2017 1702 DNR [id number]  Lorella Nimrod, MD ED      Home/SNF/Other Home  Chief Complaint pnemonia  Level of Care/Admitting Diagnosis ED Disposition    ED Disposition Condition [facility] Area: [facility] [[id number]]  Level of Care: Telemetry [5]  Admit to tele based on following criteria: Monitor for Ischemic changes  Diagnosis: Lung abscess [facility]) [[id number]]  Admitting Physician: Janece Canterbury [phone]  Attending Physician: Janece Canterbury [phone]  Estimated length of stay: past midnight tomorrow  Certification:: I certify this patient will need inpatient services for at least 2 midnights  PT Class (Do Not Modify): Inpatient [101]  PT Acc Code (Do Not Modify): Private [1]       Medical History Past Medical History:  Diagnosis Date  . HIV (human immunodeficiency virus infection) ([place])   . MRSA (methicillin resistant staph aureus) culture positive   . Streptococcal pneumonia ([facility])      Allergies Allergies  Allergen Reactions  . Sulfa Antibiotics Rash  . Tramadol Nausea Only    IV Location/Drains/Wounds Patient Lines/Drains/Airways Status   Active Line/Drains/Airways    Name:   Placement date:   Placement time:   Site:   Days:   Peripheral IV 06/21/17 Right;Upper;Medial Arm   06/21/17    0830    Arm   less than 1   Peripheral IV 06/21/17 Right Wrist   06/21/17    0852    Wrist   less than 1          Labs/Imaging Results for orders placed or performed during the hospital encounter of 06/21/17 (from the past 48 hour(s))  CBC     Status: Abnormal   Collection Time: 06/21/17  7:48 AM  Result Value Ref Range   WBC 21.6 (H) 4.0 - 10.5 K/uL   RBC 4.26 4.22 - 5.81 MIL/uL   Hemoglobin 11.4 (L) 13.0 - 17.0 g/dL   HCT 33.6 (L) 39.0 - 52.0 %   MCV 78.9 78.0 - 100.0 fL   MCH 26.8 26.0 - 34.0 pg   MCHC 33.9 30.0 - 36.0 g/dL   RDW 16.7 (H) 11.5 - 15.5 %   Platelets 407 (H) 150 - 400 K/uL    Comment: Performed at [facility], [place] [address] [place], [place] [address]  Comprehensive metabolic panel     Status: Abnormal   Collection Time: 06/21/17  7:48 AM  Result Value Ref Range   Sodium 135 135 - 145 mmol/L   Potassium 3.2 (L) 3.5 - 5.1 mmol/L   Chloride 106 101 - 111 mmol/L   CO2 17 (L) 22 - 32 mmol/L   Glucose, Bld 98 65 - 99 mg/dL   BUN 15 6 - 20 mg/dL   Creatinine, Ser 0.90 0.61 - 1.24 mg/dL   Calcium 9.1 8.9 - 10.3 mg/dL   Total Protein 9.3 (H) 6.5 - 8.1 g/dL   Albumin 3.0 (L) 3.5 - 5.0 g/dL   AST 20 15 - 41 U/L   ALT 27 17 - 63 U/L   Alkaline Phosphatase 120 38 - 126 U/L   Total Bilirubin 0.6 0.3 - 1.2 mg/dL   GFR calc non Af Amer >60 >60 mL/min   GFR calc Af Amer >60 >60 mL/min    Comment: (NOTE) The eGFR has been calculated using the CKD EPI equation. This calculation has not been validated in all clinical situations. eGFR's persistently <60 mL/min signify possible Chronic Kidney Disease.    Anion gap 12 5 - 15     Comment: Performed at [facility], [place] [address] [place], [place] [address]  Troponin I     Status: Abnormal   Collection Time: 06/21/17  7:48 AM  Result Value Ref Range   Troponin I 0.14 (HH) <0.03 ng/mL    Comment: CRITICAL RESULT CALLED TO, READ BACK BY AND VERIFIED WITH: K.PENN,RN 06/21/17 '@1009'  BY V.WILKINS Performed at [place] [address], [place], [place] [address]   Influenza panel by PCR (type A & B)     Status: None   Collection Time: 06/21/17 10:05 AM  Result Value Ref Range   Influenza A By PCR NEGATIVE NEGATIVE   Influenza B By PCR NEGATIVE NEGATIVE    Comment: (NOTE) The Xpert Xpress Flu assay is intended as an aid in the diagnosis of  influenza and should not be used as a sole basis for treatment.  This  assay is FDA approved for nasopharyngeal swab specimens only. Nasal  washings and aspirates are unacceptable for Xpert Xpress Flu testing. Performed at [facility], [place] [address], [place],  [address]    Dg Chest 2 View  Result Date: 06/21/2017 CLINICAL DATA:  Pneumonia.  Shortness of breath. EXAM: CHEST - 2 VIEW COMPARISON:  June 16, 2017 FINDINGS: The infiltrate in the left lower lobe appears mildly worsened in the interval. Mild opacity in the medial right base could represent subtle infiltrate versus vascular crowding. No pneumothorax. The heart, hila, and mediastinum are normal. Air-fluid levels are seen behind the heart on the lateral view. No hiatal hernia was seen on the CT scan from June 14, 2017. IMPRESSION: 1. The infiltrate in the left base appears mildly worsened in the interval. Vascular crowding versus subtle infiltrate in the medial right base. 2. An air-fluid level is projected behind the heart on the lateral view is identified. No hiatal hernia was identified on the CT scan from June 14, 2017 an air-fluid level could be associated with the infiltrate. Recommend a CT scan for better  evaluation. Electronically Signed   By: Dorise Bullion III M.D   On: 06/21/2017 07:56   Ct Chest W Contrast  Result Date: 06/21/2017 CLINICAL DATA:  Pneumonia.  HIV positive. EXAM: CT CHEST WITH CONTRAST TECHNIQUE: Multidetector CT imaging of the chest was performed during intravenous contrast administration. CONTRAST:  64m OMNIPAQUE IOHEXOL 300 MG/ML  SOLN COMPARISON:  CT scan June 14, 2017 FINDINGS: Cardiovascular: The thoracic aorta is nonaneurysmal with no dissection or atherosclerotic change. The heart is unchanged. The central pulmonary artery measures 3.8 cm. No obvious filling defects. Mediastinum/Nodes: Thyroid and esophagus are normal. Shotty nodes in the mediastinum without adenopathy are likely reactive. Lungs/Pleura: Central airways are normal. The patient's known left lower lobe pneumonia is again identified. There is a rounded consolidated component as seen on series 7, image 100 measuring 9.5 by 7.8 by 10.3 cm containing an air-fluid level consistent with necrotic lung/abscess. This accounts for the recent chest x-ray finding. Mild patchy ground-glass opacity in the right upper lobe is likely infectious, less focal in the interval. There is a left-sided pleural effusion which is small, extending into the left fissure. Upper Abdomen: No acute abnormality. Musculoskeletal: No chest wall abnormality. No acute or significant osseous findings. IMPRESSION: 1. Left lower lobe pneumonia. There is now a large rounded collection within the infiltrate containing air-fluid levels consistent with necrosis/abscess formation. 2. Mild patchy infectious change in the right upper lobe. 3. Reactive nodes in the mediastinum. 4. The main pulmonary artery measures 3.8 cm which is mildly dilated raising the possibility of pulmonary hypertension. 5. No other abnormalities. Electronically Signed   By: Dorise Bullion III M.D   On: 06/21/2017 11:03    Pending Labs Unresulted Labs (From admission, onward)   Start      Ordered   06/23/17 0500  Creatinine, serum  Daily,   R     06/21/17 1418   06/22/17 1655  Basic metabolic panel  Tomorrow morning,   R     06/21/17 1347   06/22/17 0500  CBC  Tomorrow morning,   R     06/21/17 1347   06/22/17 0500  Lipid panel  Tomorrow morning,   R    Question:  Specimen collection method  Answer:  IV Team   06/21/17 1348   06/22/17 0500  Hemoglobin A1c  Tomorrow morning,   R    Question:  Specimen collection method  Answer:  IV Team   06/21/17 1348   06/21/17 1350  Urine rapid drug screen (hosp performed)  STAT,   R     06/21/17 1350   06/21/17 1349  Troponin I (q 6hr x 3)  Now then every 6 hours,   R    Question:  Specimen collection method  Answer:  IV Team   06/21/17 1348   06/21/17 1347  MRSA PCR Screening  Once,   R     06/21/17 1347   06/21/17 0749  Blood culture (routine x 2)  BLOOD CULTURE X 2,   STAT     06/21/17 0748      Vitals/Pain Today's Vitals   06/21/17 1041 06/21/17 1100 06/21/17 1230 06/21/17 1400  BP: 139/78 119/70 139/74 102/69  Pulse: 93 92 94 87  Resp: (!) 24 (!) 26 (!) 27 (!) 23  Temp:      TempSrc:      SpO2: 96% 98% 99% 98%  Weight:      Height:      PainSc:        Isolation Precautions No active isolations  Medications Medications  ipratropium-albuterol (DUONEB) 0.5-2.5 (3) MG/3ML nebulizer solution 3 mL (has no administration in time range)  Darunavir-Cobicisctat-Emtricitabine-Tenofovir Alafenamide (SYMTUZA) 800-150-200-10 MG TABS 1 tablet (has no administration in time range)  dextromethorphan-guaiFENesin (MUCINEX DM) 30-600 MG per 12 hr tablet 1 tablet (has no administration in time range)  dolutegravir (TIVICAY) tablet 50 mg (has no administration in time range)  fluconazole (DIFLUCAN) tablet 200 mg (has no administration in time range)  gabapentin (NEURONTIN) capsule 100 mg (has no administration in time range)  ibuprofen (ADVIL,MOTRIN) tablet 800 mg (has no administration in time range)  magnesium oxide (MAG-OX)  tablet 400 mg (has no administration in time range)  valACYclovir (VALTREX) tablet 500 mg (has no administration in time range)  enoxaparin (LOVENOX) injection 40 mg (has no administration in time range)  acetaminophen (TYLENOL) tablet 650 mg (has no administration in time range)    Or  acetaminophen (TYLENOL) suppository 650 mg (has no administration in time range)  ketorolac (TORADOL) 30 MG/ML injection 30 mg (has no administration in time range)  polyethylene glycol (MIRALAX / GLYCOLAX) packet 17 g (has no administration in time range)  bisacodyl (DULCOLAX) EC tablet 5 mg (has no administration in time range)  ondansetron (ZOFRAN) tablet 4 mg (has no administration in time range)    Or  ondansetron (ZOFRAN) injection 4 mg (has no administration in time range)  aspirin EC tablet 325 mg (has no administration in time range)  nitroGLYCERIN (NITROSTAT) SL tablet 0.4 mg (has no administration in time range)  potassium chloride SA (K-DUR,KLOR-CON) CR tablet 40 mEq (has no administration in time range)  ferrous sulfate tablet 325 mg (has no administration in time range)  Ampicillin-Sulbactam (UNASYN) 3 g in sodium chloride 0.9 % 100 mL IVPB (has no administration in time range)  vancomycin (VANCOCIN) 1,250 mg in sodium chloride 0.9 % 250 mL IVPB (has no administration in time range)  albuterol (PROVENTIL) (2.5 MG/3ML) 0.083% nebulizer solution 5 mg (5 mg Nebulization Given 06/21/17 0643)  vancomycin (VANCOCIN) IVPB 1000 mg/200 mL premix (0 mg Intravenous Stopped 06/21/17 1233)  piperacillin-tazobactam (ZOSYN) IVPB 3.375 g (0 g Intravenous Stopped 06/21/17 1233)  sodium chloride 0.9 % injection (  Given 06/21/17 1046)  iohexol (OMNIPAQUE) 300 MG/ML solution 75 mL (75 mLs Intravenous Contrast Given 06/21/17 1020)    Mobility walks

## 2017-06-21 NOTE — ED Triage Notes (Signed)
States he has pneumonia and is now short of breath rr 28

## 2017-06-21 NOTE — ED Notes (Signed)
Attempted to call report. RN was transferred to receptionist. Was informed that RN needed to call back in 5-10 minutes to give report.

## 2017-06-21 NOTE — ED Notes (Signed)
ED Provider at bedside. 

## 2017-06-21 NOTE — ED Notes (Signed)
Patient transported to CT 

## 2017-06-21 NOTE — H&P (Signed)
History and Physical    EMIN FOREE XLK:[id number] DOB: 08-18-1965 DOA: 06/21/2017  Referring MD/NP/PA: Azalia Bilis PCP: Patient, No Pcp Per   Patient coming from: home  Chief Complaint: SOB  HPI: [name] is a 52 y.o. male with history of HIV with undetectable viral load and CD4 count of 280 on 06/07/2017, asthma, GERD, history of pulmonary embolism and aseptic necrosis of the right femur, who was hospitalized from 3/31-4/4 with sepsis, pneumonia, bacteremia with pansensitive strep pneumo.  Echocardiogram was negative for vegetation.  He was placed on IV antibiotics during his hospitalization and then transitioned to oral amoxicillin at the time of discharge.  He filled his prescription and was feeling well at the time that he went home.  The day prior to admission he started developing a worsening cough that was initially productive of some foamy phlegm but is now productive of a brownish green phlegm.  He has had worsening shortness of breath was unable to sleep last night.  He feels wiped out and exhausted today.  Albuterol has not helped him.  He is also had some posttussive emesis and possibly some emesis aside from that but he denies diarrhea.  Even prior to his last hospitalization he has had some left shoulder pain that radiates across his chest.  Currently he is having mostly epigastric soreness and some mid back soreness/pain that he attributes to coughing.  He has shortness of breath, nausea.  He has a brother who has had quadruple bypass in his 49s.  ED Course: Vital signs: Patient is afebrile and hemodynamically stable but tachypneic to the mid 20s satting 100% on room air.  Labs: Mild hypokalemia, troponin of 0.14, white blood cell count of 21.6 which is up from 10.9 on 4/4.  CT of the chest demonstrated his previously known left lower lobe pneumonia but now he has a 9.5 x 7.8 x 10.3 cm necrotic lung with abscess.  Normal sinus rhythm on EKG.  Review of Systems:  Headache Complete 12 point review of systems reviewed with patient and negative except as mentioned above.    Past Medical History:  Diagnosis Date  . HIV (human immunodeficiency virus infection) (HCC)   . MRSA (methicillin resistant staph aureus) culture positive   . Streptococcal pneumonia [facility])     Past Surgical History:  Procedure Laterality Date  . 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 His smokeless tobacco use includes snuff. He reports that he does not drink alcohol or use drugs.  Allergies  Allergen Reactions  . Sulfa Antibiotics Rash  . Tramadol Nausea Only    Family History  Problem Relation Age of Onset  . Cancer Mother   . CAD Brother     Prior to Admission medications   Medication Sig Start Date End Date Taking? Authorizing Provider  albuterol (PROVENTIL HFA;VENTOLIN HFA) 108 (90 Base) MCG/ACT inhaler Inhale 2 puffs into the lungs every 6 (six) hours as needed for wheezing or shortness of breath. 06/18/17  Yes Garnette Gunner, MD  amoxicillin (AMOXIL) 500 MG capsule Take 1 capsule (500 mg total) by mouth every 8 (eight) hours for 12 days. 06/18/17 06/30/17 Yes Garnette Gunner, MD  Darunavir-Cobicisctat-Emtricitabine-Tenofovir Alafenamide [facility]) 800-150-200-10 MG TABS Take 1 tablet by mouth daily with breakfast. 05/04/17  Yes Daiva Eves, Lisette Grinder, MD  dextromethorphan-guaiFENesin [facility] DM) 30-600 MG 12hr tablet Take 1 tablet by mouth  2 (two) times daily. 06/18/17 07/18/17 Yes Garnette Gunner, MD  dolutegravir (TIVICAY) 50 MG tablet Take 1 tablet (50 mg total) by mouth daily. 05/04/17  Yes Daiva Eves, Lisette Grinder, MD  fluconazole (DIFLUCAN) 200 MG tablet Take 1 tablet (200 mg total) by mouth daily. 05/04/17  Yes Daiva Eves, Lisette Grinder, MD  gabapentin (NEURONTIN) 100 MG capsule Take 1 capsule (100 mg total) by mouth 3 (three) times daily. 05/04/17  Yes Daiva Eves, Lisette Grinder, MD   ibuprofen (ADVIL,MOTRIN) 800 MG tablet Take 1 tablet (800 mg total) by mouth every 8 (eight) hours as needed for moderate pain. 06/18/17  Yes Garnette Gunner, MD  MAGNESIUM OXIDE PO Take 2 tablets by mouth 2 (two) times daily.   Yes [provider]  valACYclovir (VALTREX) 500 MG tablet Take 1 tablet (500 mg total) by mouth daily. 05/04/17  Yes Daiva Eves, Lisette Grinder, MD  VITAMIN D, ERGOCALCIFEROL, PO Take 1 tablet by mouth daily.   Yes [provider]  benzonatate (TESSALON) 200 MG capsule Take 1 capsule (200 mg total) by mouth 3 (three) times daily. Patient not taking: Reported on 06/21/2017 06/18/17   Garnette Gunner, MD  feeding supplement, ENSURE ENLIVE, (ENSURE ENLIVE) LIQD Take 237 mLs by mouth 2 (two) times daily between meals. Patient not taking: Reported on 06/21/2017 06/18/17   Garnette Gunner, MD  folic acid (FOLVITE) 1 MG tablet Take 1 tablet (1 mg total) by mouth daily. Patient not taking: Reported on 06/21/2017 06/18/17   Garnette Gunner, MD  lidocaine (LIDODERM) 5 % Place 1 patch onto the skin daily. Remove & Discard patch within 12 hours or as directed by MD Patient not taking: Reported on 06/21/2017 06/18/17   Garnette Gunner, MD  Multiple Vitamin (MULTIVITAMIN WITH MINERALS) TABS tablet Take 1 tablet by mouth daily. Patient not taking: Reported on 06/21/2017 06/18/17   Garnette Gunner, MD  oxyCODONE-acetaminophen (PERCOCET/ROXICET) 5-325 MG tablet Take 1 tablet by mouth every 6 (six) hours as needed for severe pain. Patient not taking: Reported on 06/21/2017 06/07/17   Bethel Born, PA-C  pantoprazole (PROTONIX) 40 MG tablet Take 1 tablet (40 mg total) by mouth daily. Patient not taking: Reported on 06/21/2017 06/18/17   Garnette Gunner, MD    Physical Exam: Vitals:   06/21/17 0900 06/21/17 1041 06/21/17 1100 06/21/17 1230  BP: 131/71 139/78 119/70 139/74  Pulse: 83 93 92 94  Resp: (!) 21 (!) 24 (!) 26 (!) 27  Temp:      TempSrc:      SpO2: 100% 96% 98% 99%   Weight:      Height:        Constitutional: Mild respiratory distress with tachypnea, occasional SCM retractions, flushed and diaphoretic Eyes: PERRL, lids and conjunctivae normal ENMT:  Moist mucous membranes.  Oropharynx nonerythematous, no exudates.   Neck:  No nuchal rigidity, no masses Respiratory: Very diminished at the left lower lobe and left mid back with egophony, dull to percussion, no wheezes or rhonchi  cardiovascular: Regular rate and rhythm, no murmurs / rubs / gallops.  2+ radial pulses. Abdomen:  Normal active bowel sounds, soft, nondistended, nontender Musculoskeletal: Normal muscle tone and bulk.  No contractures.  Skin:  no rashes, abrasions, or ulcers Neurologic:  CN 2-12 grossly intact. Sensation intact to light touch, strength 5/5 throughout Psychiatric:  Alert and oriented x 3. Normal affect.   Labs on Admission: I have personally reviewed following labs and imaging studies  CBC: Recent Labs  Lab 06/15/17 0746 06/16/17 0731 06/17/17 0702 06/18/17 0613 06/21/17 0748  WBC 11.6* 11.0* 11.4* 10.9* 21.6*  HGB 10.1* 9.8* 10.0* 10.0* 11.4*  HCT 29.8* 29.0* 29.2* 29.9* 33.6*  MCV 76.0* 76.3* 76.6* 79.1 78.9  PLT 166 156 155 166 407*   Basic Metabolic Panel: Recent Labs  Lab 06/16/17 0731 06/16/17 1410 06/16/17 1842 06/17/17 0702 06/17/17 0730 06/18/17 0613 06/21/17 0748  NA 138 135 135 137  --  135 135  K 2.5* 2.8* 2.8* 3.4*  --  3.6 3.2*  CL 112* 110 110 112*  --  108 106  CO2 20* 18* 17* 17*  --  20* 17*  GLUCOSE 109* 117* 130* 120*  --  104* 98  BUN 16 15 16 13   --  14 15  CREATININE 1.30* 1.21 1.36* 1.05  --  0.99 0.90  CALCIUM 8.3* 8.1* 8.2* 8.3*  --  8.4* 9.1  MG 1.9  --   --   --  1.7  --   --    GFR: Estimated Creatinine Clearance: 109 mL/min (by C-G formula based on SCr of 0.9 mg/dL). Liver Function Tests: Recent Labs  Lab 06/15/17 0746 06/21/17 0748  AST 25 20  ALT 30 27  ALKPHOS 95 120  BILITOT 1.1 0.6  PROT 6.1* 9.3*   ALBUMIN 2.2* 3.0*   No results for input(s): LIPASE, AMYLASE in the last 168 hours. No results for input(s): AMMONIA in the last 168 hours. Coagulation Profile: Recent Labs  Lab 06/14/17 1440  INR 1.46   Cardiac Enzymes: Recent Labs  Lab 06/14/17 1716 06/15/17 0030 06/15/17 0746 06/21/17 0748  TROPONINI <0.03 0.05* <0.03 0.14*   BNP (last 3 results) No results for input(s): PROBNP in the last 8760 hours. HbA1C: No results for input(s): HGBA1C in the last 72 hours. CBG: No results for input(s): GLUCAP in the last 168 hours. Lipid Profile: No results for input(s): CHOL, HDL, LDLCALC, TRIG, CHOLHDL, LDLDIRECT in the last 72 hours. Thyroid Function Tests: No results for input(s): TSH, T4TOTAL, FREET4, T3FREE, THYROIDAB in the last 72 hours. Anemia Panel: No results for input(s): VITAMINB12, FOLATE, FERRITIN, TIBC, IRON, RETICCTPCT in the last 72 hours. Urine analysis: No results found for: COLORURINE, APPEARANCEUR, LABSPEC, PHURINE, GLUCOSEU, HGBUR, BILIRUBINUR, KETONESUR, PROTEINUR, UROBILINOGEN, NITRITE, LEUKOCYTESUR Sepsis Labs: @LABRCNTIP (procalcitonin:4,lacticidven:4) ) Recent Results (from the past 240 hour(s))  Culture, blood (x 2)     Status: Abnormal   Collection Time: 06/14/17  7:55 AM  Result Value Ref Range Status   Specimen Description BLOOD BLOOD RIGHT FOREARM  Final   Special Requests   Final    IN BOTH AEROBIC AND ANAEROBIC BOTTLES Blood Culture adequate volume   Culture  Setup Time   Final    AEROBIC BOTTLE ONLY GRAM POSITIVE COCCI IN CHAINS CRITICAL RESULT CALLED TO, READ BACK BY AND VERIFIED WITH: TO GREG ABBOTT PHARMD BY [facility] 06/15/2017 AT 3:08AM Performed at [facility], [address], [place], [place] [address]    Culture STREPTOCOCCUS PNEUMONIAE (A)  Final   Report Status 06/16/2017 FINAL  Final   Organism ID, Bacteria STREPTOCOCCUS PNEUMONIAE  Final      Susceptibility   Streptococcus pneumoniae - MIC*    ERYTHROMYCIN <=0.12  SENSITIVE Sensitive     LEVOFLOXACIN 0.5 SENSITIVE Sensitive     PENICILLIN (meningitis) <=0.06 SENSITIVE Sensitive     PENICILLIN (non-meningitis) <=0.06 SENSITIVE Sensitive     CEFTRIAXONE (non-meningitis) <=0.12 SENSITIVE Sensitive     CEFTRIAXONE (  meningitis) <=0.12 SENSITIVE Sensitive     * STREPTOCOCCUS PNEUMONIAE  Blood Culture ID Panel (Reflexed)     Status: Abnormal   Collection Time: 06/14/17  7:55 AM  Result Value Ref Range Status   Enterococcus species NOT DETECTED NOT DETECTED Final   Listeria monocytogenes NOT DETECTED NOT DETECTED Final   Staphylococcus species NOT DETECTED NOT DETECTED Final   Staphylococcus aureus NOT DETECTED NOT DETECTED Final   Streptococcus species DETECTED (A) NOT DETECTED Final    Comment: CRITICAL RESULT CALLED TO, READ BACK BY AND VERIFIED WITH: TO GREG ABBOTT PHARMD BY [facility] 06/15/2017 AT 3:08AM`    Streptococcus agalactiae NOT DETECTED NOT DETECTED Final   Streptococcus pneumoniae DETECTED (A) NOT DETECTED Final    Comment: CRITICAL RESULT CALLED TO, READ BACK BY AND VERIFIED WITH: TO GREG ABBOTT PHARMD BY [facility] 06/15/2017 AT 3:08AM    Streptococcus pyogenes NOT DETECTED NOT DETECTED Final   Acinetobacter baumannii NOT DETECTED NOT DETECTED Final   Enterobacteriaceae species NOT DETECTED NOT DETECTED Final   Enterobacter cloacae complex NOT DETECTED NOT DETECTED Final   Escherichia coli NOT DETECTED NOT DETECTED Final   Klebsiella oxytoca NOT DETECTED NOT DETECTED Final   Klebsiella pneumoniae NOT DETECTED NOT DETECTED Final   Proteus species NOT DETECTED NOT DETECTED Final   Serratia marcescens NOT DETECTED NOT DETECTED Final   Haemophilus influenzae NOT DETECTED NOT DETECTED Final   Neisseria meningitidis NOT DETECTED NOT DETECTED Final   Pseudomonas aeruginosa NOT DETECTED NOT DETECTED Final   Candida albicans NOT DETECTED NOT DETECTED Final   Candida glabrata NOT DETECTED NOT DETECTED Final   Candida krusei NOT DETECTED NOT  DETECTED Final   Candida parapsilosis NOT DETECTED NOT DETECTED Final   Candida tropicalis NOT DETECTED NOT DETECTED Final    Comment: Performed at [facility], [address] [place], [place] [address]  Culture, blood (x 2)     Status: Abnormal   Collection Time: 06/14/17  8:00 AM  Result Value Ref Range Status   Specimen Description BLOOD LEFT ANTECUBITAL  Final   Special Requests   Final    IN BOTH AEROBIC AND ANAEROBIC BOTTLES Blood Culture adequate volume   Culture  Setup Time   Final    AEROBIC BOTTLE ONLY GRAM POSITIVE COCCI IN CHAINS CRITICAL RESULT CALLED TO, READ BACK BY AND VERIFIED WITH: TO GREG ABBOTT PHARMD BY [facility] 06/15/2017 AT 3:08AM    Culture (A)  Final    STREPTOCOCCUS PNEUMONIAE SUSCEPTIBILITIES PERFORMED ON PREVIOUS CULTURE WITHIN THE LAST 5 DAYS. Performed at [facility], [address], [place], [place] [address]    Report Status 06/16/2017 FINAL  Final  MRSA PCR Screening     Status: None   Collection Time: 06/16/17  5:37 AM  Result Value Ref Range Status   MRSA by PCR NEGATIVE NEGATIVE Final    Comment:        The GeneXpert MRSA Assay (FDA approved for NASAL specimens only), is one component of a comprehensive MRSA colonization surveillance program. It is not intended to diagnose MRSA infection nor to guide or monitor treatment for MRSA infections. Performed at [facility], [address], [place], [place] [address]      Radiological Exams on Admission: Dg Chest 2 View  Result Date: 06/21/2017 CLINICAL DATA:  Pneumonia.  Shortness of breath. EXAM: CHEST - 2 VIEW COMPARISON:  June 16, 2017 FINDINGS: The infiltrate in the left lower lobe appears mildly worsened in the interval. Mild  opacity in the medial right base could represent subtle infiltrate versus vascular crowding. No pneumothorax. The heart, hila, and mediastinum are normal. Air-fluid levels are seen behind the heart on the lateral view. No hiatal hernia was seen on  the CT scan from June 14, 2017. IMPRESSION: 1. The infiltrate in the left base appears mildly worsened in the interval. Vascular crowding versus subtle infiltrate in the medial right base. 2. An air-fluid level is projected behind the heart on the lateral view is identified. No hiatal hernia was identified on the CT scan from June 14, 2017 an air-fluid level could be associated with the infiltrate. Recommend a CT scan for better evaluation. Electronically Signed   By: Gerome Samavid  Williams III M.D   On: 06/21/2017 07:56   Ct Chest W Contrast  Result Date: 06/21/2017 CLINICAL DATA:  Pneumonia.  HIV positive. EXAM: CT CHEST WITH CONTRAST TECHNIQUE: Multidetector CT imaging of the chest was performed during intravenous contrast administration. CONTRAST:  75mL OMNIPAQUE IOHEXOL 300 MG/ML  SOLN COMPARISON:  CT scan June 14, 2017 FINDINGS: Cardiovascular: The thoracic aorta is nonaneurysmal with no dissection or atherosclerotic change. The heart is unchanged. The central pulmonary artery measures 3.8 cm. No obvious filling defects. Mediastinum/Nodes: Thyroid and esophagus are normal. Shotty nodes in the mediastinum without adenopathy are likely reactive. Lungs/Pleura: Central airways are normal. The patient's known left lower lobe pneumonia is again identified. There is a rounded consolidated component as seen on series 7, image 100 measuring 9.5 by 7.8 by 10.3 cm containing an air-fluid level consistent with necrotic lung/abscess. This accounts for the recent chest x-ray finding. Mild patchy ground-glass opacity in the right upper lobe is likely infectious, less focal in the interval. There is a left-sided pleural effusion which is small, extending into the left fissure. Upper Abdomen: No acute abnormality. Musculoskeletal: No chest wall abnormality. No acute or significant osseous findings. IMPRESSION: 1. Left lower lobe pneumonia. There is now a large rounded collection within the infiltrate containing air-fluid levels  consistent with necrosis/abscess formation. 2. Mild patchy infectious change in the right upper lobe. 3. Reactive nodes in the mediastinum. 4. The main pulmonary artery measures 3.8 cm which is mildly dilated raising the possibility of pulmonary hypertension. 5. No other abnormalities. Electronically Signed   By: Gerome Samavid  Williams III M.D   On: 06/21/2017 11:03    EKG: Independently reviewed. NSR, no acute ischemic changes  Assessment/Plan Active Problems:   Lung abscess (HCC)   Respiratory distress due to lung abscess related to strep pneumo pneumonia bacteremia.  He has been previously colonized with MRSA also.  - MRSA PCR - Start vancomycin and unasyn.  Unlikely this is pseudomonal infection given clinical history - Spoke with Dr. Ninetta LightsHatcher:  Consider PICC line for long course of outpatient therapy with close follow up with ID vs. Fluoroquinolone orally.  Patient denies history of IVDA so PICC line would be preferable.  I did not formally request consult today.  Can consult in a few days once culture data is back for final recommendations regarding abx.   -  Trend WBC  Elevated troponin, likely related to lung abscess/infection, but has a FHx of early CAD.  He has had vague chest pains which he attributes to his pneumonia.   -  Just had ECHO done a couple days ago -  Telemetry -  Cycle troponins -  A1c and lipid panel - check UDS   Asthma, stable.   -  Start scheduled duonebs  HIV: stable, cont to  monitor clinically  - continue symtuza and tivicay  Hypokalemia, mild -  Oral KCl repletion  Iron deficiency anemia (ferritin low in 01/2017) - start oral iron supplementation -  Repeat anemia panel in a month  Lung nodules:59mm RUL and RLL nodules, needs outpatient followup.  -outpatient CT chest in 3 months (but will likely have another one sooner for f/u of abscess)   Hx of C. Diff -  Monitor for diarrhea  GERD, stable, cont protonix  DVT prophylaxis: lovenox  Code Status:  DNR Family Communication:  Patient and his partner Disposition Plan:  Likely to home with PICC line in a few days  Consults called: none  Admission status: inpatient, telemetry   Renae Fickle MD [facility] Pager [phone]  If 7PM-7AM, please contact night-coverage [email or web address] Password [email or web address]  06/21/2017, 2:18 PM

## 2017-06-21 NOTE — ED Provider Notes (Signed)
[facility] HOSPITAL TELEMETRY/UROLOGY WEST Provider Note   CSN: [id number] Arrival date & time: 06/21/17  9147     History   Chief Complaint Chief Complaint  Patient presents with  . Shortness of Breath    HPI [name] is a 52 y.o. male.  HPI Patient is a 52 year old male with a history of HIV and intermittent compliance with his anti-retroviral medications who presents the emergency department with worsening cough and shortness of breath and feels as though his pneumonia is worsening.  He was recently admitted to hospital and treated with IV antibiotics for a left lower lobe pneumonia.  He states he feels worse since discharge 2 days ago.  At that time he was on IV antibiotics in the hospital and was transitioned to Augmentin at time of discharge.  Ongoing fevers at home.  Denies nausea vomiting.  No diarrhea.  No recent influenza contacts.   Past Medical History:  Diagnosis Date  . HIV (human immunodeficiency virus infection) (HCC)   . MRSA (methicillin resistant staph aureus) culture positive   . Streptococcal pneumonia [facility])     Patient Active Problem List   Diagnosis Date Noted  . Lung abscess (HCC) 06/21/2017  . Bacteremia   . Lung nodule, multiple 06/14/2017  . Pneumonia 06/14/2017  . Sepsis (HCC)   . Hypokalemia   . Asthma 04/22/2017  . History of Clostridium difficile colitis 04/22/2017  . Infectious folliculitis 01/30/2017  . Folliculitis 01/30/2017  . Complication of implanted device 07/24/2015  . 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 GERD (gastroesophageal reflux disease) 01/25/2015  . Hypotension 01/25/2015  . HIV (human immunodeficiency virus infection) (HCC) 01/24/2015  . Primary osteoarthritis of both hips 03/29/2013  . History of pulmonary embolus (PE) 03/17/2013  . 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 Authorizing Provider  albuterol (PROVENTIL HFA;VENTOLIN HFA) 108 (90 Base) MCG/ACT inhaler Inhale 2 puffs into the lungs every 6 (six) hours as needed for wheezing or shortness of breath. 06/18/17  Yes Garnette Gunner, MD  amoxicillin (AMOXIL) 500 MG capsule Take 1 capsule (500 mg total) by mouth every 8 (eight) hours for 12 days. 06/18/17 06/30/17 Yes Garnette Gunner, MD  Darunavir-Cobicisctat-Emtricitabine-Tenofovir Alafenamide [facility]) 800-150-200-10 MG TABS Take 1 tablet by mouth daily with breakfast. 05/04/17  Yes Daiva Eves, Lisette Grinder, MD  dextromethorphan-guaiFENesin [facility] DM) 30-600 MG 12hr tablet Take 1 tablet by mouth 2 (two) times daily. 06/18/17 07/18/17 Yes Garnette Gunner, MD  dolutegravir (TIVICAY) 50 MG tablet Take 1 tablet (50 mg total) by mouth daily. 05/04/17  Yes Daiva Eves, Lisette Grinder, MD  fluconazole (DIFLUCAN) 200 MG tablet Take 1 tablet (200 mg total) by mouth daily. 05/04/17  Yes Daiva Eves, Lisette Grinder, MD  gabapentin (NEURONTIN) 100 MG capsule Take 1 capsule (100 mg total) by mouth 3 (three) times daily. 05/04/17  Yes Daiva Eves, Lisette Grinder, MD  ibuprofen (ADVIL,MOTRIN) 800 MG tablet Take 1 tablet (800 mg total) by mouth every 8 (eight) hours as needed for moderate pain. 06/18/17  Yes Garnette Gunner, MD  MAGNESIUM OXIDE PO Take  2 tablets by mouth 2 (two) times daily.   Yes [provider]  valACYclovir (VALTREX) 500 MG tablet Take 1 tablet (500 mg total) by mouth daily. 05/04/17  Yes Daiva EvesVan Dam, Lisette Grinderornelius N, MD  VITAMIN D, ERGOCALCIFEROL, PO Take 1 tablet by mouth daily.   Yes [provider]    Family History Family History  Problem Relation Age of Onset  . Cancer Mother   . CAD Brother     Social  History Social History   Tobacco Use  . Smoking status: Former Games developermoker  . Smokeless tobacco: Current User    Types: Snuff  Substance Use Topics  . Alcohol use: Never    Frequency: Never  . Drug use: No     Allergies   Sulfa antibiotics and Tramadol   Review of Systems Review of Systems  All other systems reviewed and are negative.    Physical Exam Updated Vital Signs BP 123/66 (BP Location: Left Arm)   Pulse 82   Temp 98.4 F (36.9 C) (Oral)   Resp (!) 27   Ht 6\' 2"  (1.88 m)   Wt 83.9 kg (185 lb)   SpO2 99%   BMI 23.75 kg/m   Physical Exam  Constitutional: He is oriented to person, place, and time. He appears well-developed and well-nourished.  HENT:  Head: Normocephalic and atraumatic.  Eyes: EOM are normal.  Neck: Normal range of motion.  Cardiovascular: Normal rate, regular rhythm, normal heart sounds and intact distal pulses.  Pulmonary/Chest: Effort normal and breath sounds normal. No respiratory distress.  Abdominal: Soft. He exhibits no distension. There is no tenderness.  Musculoskeletal: Normal range of motion.  Neurological: He is alert and oriented to person, place, and time.  Skin: Skin is warm and dry.  Psychiatric: He has a normal mood and affect. Judgment normal.  Nursing note and vitals reviewed.    ED Treatments / Results  Labs (all labs ordered are listed, but only abnormal results are displayed) Labs Reviewed  CBC - Abnormal; Notable for the following components:      Result Value   WBC 21.6 (*)    Hemoglobin 11.4 (*)    HCT 33.6 (*)    RDW 16.7 (*)    Platelets 407 (*)    All other components within normal limits  COMPREHENSIVE METABOLIC PANEL - Abnormal; Notable for the following components:   Potassium 3.2 (*)    CO2 17 (*)    Total Protein 9.3 (*)    Albumin 3.0 (*)    All other components within normal limits  TROPONIN I - Abnormal; Notable for the following components:   Troponin I 0.14 (*)    All other components  within normal limits  CULTURE, BLOOD (ROUTINE X 2)  CULTURE, BLOOD (ROUTINE X 2)  MRSA PCR SCREENING  INFLUENZA PANEL BY PCR (TYPE A & B)  TROPONIN I  TROPONIN I  TROPONIN I  RAPID URINE DRUG SCREEN, HOSP PERFORMED  BASIC METABOLIC PANEL  CBC  LIPID PANEL  HEMOGLOBIN A1C    EKG EKG Interpretation  Date/Time:  Sunday June 21 2017 08:56:05 EDT Ventricular Rate:  89 PR Interval:    QRS Duration: 107 QT Interval:  366 QTC Calculation: 446 R Axis:   27 Text Interpretation:  Sinus rhythm No significant change was found Confirmed by Azalia Bilisampos, Pravin Perezperez ([id number]) on 06/21/2017 9:57:08 AM Also confirmed by Azalia Bilisampos, Vinette Crites ([id number]), editor Sheppard EvensSimpson, Miranda [phone]  on 06/21/2017 9:58:39 AM   Radiology Dg Chest 2 View  Result Date: 06/21/2017 CLINICAL DATA:  Pneumonia.  Shortness of breath. EXAM: CHEST - 2 VIEW COMPARISON:  June 16, 2017 FINDINGS: The infiltrate in the left lower lobe appears mildly worsened in the interval. Mild opacity in the medial right base could represent subtle infiltrate versus vascular crowding. No pneumothorax. The heart, hila, and mediastinum are normal. Air-fluid levels are seen behind the heart on the lateral view. No hiatal hernia was seen on the CT scan from June 14, 2017. IMPRESSION: 1. The infiltrate in the left base appears mildly worsened in the interval. Vascular crowding versus subtle infiltrate in the medial right base. 2. An air-fluid level is projected behind the heart on the lateral view is identified. No hiatal hernia was identified on the CT scan from June 14, 2017 an air-fluid level could be associated with the infiltrate. Recommend a CT scan for better evaluation. Electronically Signed   By: Gerome Sam III M.D   On: 06/21/2017 07:56   Ct Chest W Contrast  Result Date: 06/21/2017 CLINICAL DATA:  Pneumonia.  HIV positive. EXAM: CT CHEST WITH CONTRAST TECHNIQUE: Multidetector CT imaging of the chest was performed during intravenous contrast administration.  CONTRAST:  75mL OMNIPAQUE IOHEXOL 300 MG/ML  SOLN COMPARISON:  CT scan June 14, 2017 FINDINGS: Cardiovascular: The thoracic aorta is nonaneurysmal with no dissection or atherosclerotic change. The heart is unchanged. The central pulmonary artery measures 3.8 cm. No obvious filling defects. Mediastinum/Nodes: Thyroid and esophagus are normal. Shotty nodes in the mediastinum without adenopathy are likely reactive. Lungs/Pleura: Central airways are normal. The patient's known left lower lobe pneumonia is again identified. There is a rounded consolidated component as seen on series 7, image 100 measuring 9.5 by 7.8 by 10.3 cm containing an air-fluid level consistent with necrotic lung/abscess. This accounts for the recent chest x-ray finding. Mild patchy ground-glass opacity in the right upper lobe is likely infectious, less focal in the interval. There is a left-sided pleural effusion which is small, extending into the left fissure. Upper Abdomen: No acute abnormality. Musculoskeletal: No chest wall abnormality. No acute or significant osseous findings. IMPRESSION: 1. Left lower lobe pneumonia. There is now a large rounded collection within the infiltrate containing air-fluid levels consistent with necrosis/abscess formation. 2. Mild patchy infectious change in the right upper lobe. 3. Reactive nodes in the mediastinum. 4. The main pulmonary artery measures 3.8 cm which is mildly dilated raising the possibility of pulmonary hypertension. 5. No other abnormalities. Electronically Signed   By: Gerome Sam III M.D   On: 06/21/2017 11:03    Procedures Procedures (including critical care time)  Medications Ordered in ED Medications  Darunavir-Cobicisctat-Emtricitabine-Tenofovir Alafenamide (SYMTUZA) 800-150-200-10 MG TABS 1 tablet (has no administration in time range)  dextromethorphan-guaiFENesin (MUCINEX DM) 30-600 MG per 12 hr tablet 1 tablet (has no administration in time range)  dolutegravir (TIVICAY)  tablet 50 mg (has no administration in time range)  fluconazole (DIFLUCAN) tablet 200 mg (has no administration in time range)  gabapentin (NEURONTIN) capsule 100 mg (has no administration in time range)  ibuprofen (ADVIL,MOTRIN) tablet 800 mg (has no administration in time range)  magnesium oxide (MAG-OX) tablet 400 mg (has no administration in time range)  valACYclovir (VALTREX) tablet 500 mg (has no administration in time range)  enoxaparin (LOVENOX) injection 40 mg (has no administration in time range)  acetaminophen (TYLENOL) tablet 650 mg (has no administration in time range)    Or  acetaminophen (TYLENOL) suppository 650 mg (has no administration in time range)  ketorolac (TORADOL)  30 MG/ML injection 30 mg (has no administration in time range)  polyethylene glycol (MIRALAX / GLYCOLAX) packet 17 g (has no administration in time range)  bisacodyl (DULCOLAX) EC tablet 5 mg (has no administration in time range)  ondansetron (ZOFRAN) tablet 4 mg (has no administration in time range)    Or  ondansetron (ZOFRAN) injection 4 mg (has no administration in time range)  aspirin EC tablet 325 mg (has no administration in time range)  nitroGLYCERIN (NITROSTAT) SL tablet 0.4 mg (has no administration in time range)  potassium chloride SA (K-DUR,KLOR-CON) CR tablet 40 mEq (has no administration in time range)  ferrous sulfate tablet 325 mg (has no administration in time range)  Ampicillin-Sulbactam (UNASYN) 3 g in sodium chloride 0.9 % 100 mL IVPB (has no administration in time range)  vancomycin (VANCOCIN) 1,250 mg in sodium chloride 0.9 % 250 mL IVPB (has no administration in time range)  ipratropium-albuterol (DUONEB) 0.5-2.5 (3) MG/3ML nebulizer solution 3 mL (has no administration in time range)  albuterol (PROVENTIL) (2.5 MG/3ML) 0.083% nebulizer solution 5 mg (5 mg Nebulization Given 06/21/17 0643)  vancomycin (VANCOCIN) IVPB 1000 mg/200 mL premix (0 mg Intravenous Stopped 06/21/17 1233)    piperacillin-tazobactam (ZOSYN) IVPB 3.375 g (0 g Intravenous Stopped 06/21/17 1233)  sodium chloride 0.9 % injection (  Given 06/21/17 1046)  iohexol (OMNIPAQUE) 300 MG/ML solution 75 mL (75 mLs Intravenous Contrast Given 06/21/17 1020)     Initial Impression / Assessment and Plan / ED Course  I have reviewed the triage vital signs and the nursing notes.  Pertinent labs & imaging results that were available during my care of the patient were reviewed by me and considered in my medical decision making (see chart for details).     Worsening left lower lobe pneumonia now with evidence of left lower lobe lung abscess.  IV antibiotics now.  Blood cultures obtained.  Strep pneumo growing out of his last blood culture.  Patient will be admitted to hospitalist service  Final Clinical Impressions(s) / ED Diagnoses   Final diagnoses:  Abscess of lower lobe of left lung with pneumonia [facility])    ED Discharge Orders    None       Azalia Bilis, MD 06/21/17 1721

## 2017-06-22 DIAGNOSIS — J851 Abscess of lung with pneumonia: Principal | ICD-10-CM

## 2017-06-22 DIAGNOSIS — Z8619 Personal history of other infectious and parasitic diseases: Secondary | ICD-10-CM

## 2017-06-22 DIAGNOSIS — B20 Human immunodeficiency virus [HIV] disease: Secondary | ICD-10-CM

## 2017-06-22 DIAGNOSIS — E876 Hypokalemia: Secondary | ICD-10-CM

## 2017-06-22 LAB — LIPID PANEL
CHOLESTEROL: 94 mg/dL (ref 0–200)
HDL: 13 mg/dL — ABNORMAL LOW (ref >40)
LDL Cholesterol: 71 mg/dL (ref 0–99)
Total CHOL/HDL Ratio: 7.2 RATIO
Triglycerides: 52 mg/dL (ref <150)
VLDL: 10 mg/dL (ref 0–40)

## 2017-06-22 LAB — CBC
HCT: 31.2 % — ABNORMAL LOW (ref 39.0–52.0)
Hemoglobin: 10 g/dL — ABNORMAL LOW (ref 13.0–17.0)
MCH: 26.2 pg (ref 26.0–34.0)
MCHC: 32.1 g/dL (ref 30.0–36.0)
MCV: 81.7 fL (ref 78.0–100.0)
PLATELETS: 427 10*3/uL — AB (ref 150–400)
RBC: 3.82 MIL/uL — ABNORMAL LOW (ref 4.22–5.81)
RDW: 17.5 % — AB (ref 11.5–15.5)
WBC: 23.5 10*3/uL — AB (ref 4.0–10.5)

## 2017-06-22 LAB — BASIC METABOLIC PANEL
Anion gap: 10 (ref 5–15)
BUN: 12 mg/dL (ref 6–20)
CALCIUM: 8.3 mg/dL — AB (ref 8.9–10.3)
CO2: 15 mmol/L — ABNORMAL LOW (ref 22–32)
CREATININE: 0.9 mg/dL (ref 0.61–1.24)
Chloride: 112 mmol/L — ABNORMAL HIGH (ref 101–111)
GFR calc Af Amer: >60 mL/min (ref >60)
Glucose, Bld: 93 mg/dL (ref 65–99)
Potassium: 3.5 mmol/L (ref 3.5–5.1)
SODIUM: 137 mmol/L (ref 135–145)

## 2017-06-22 LAB — HEMOGLOBIN A1C
HEMOGLOBIN A1C: 5.1 % (ref 4.8–5.6)
Mean Plasma Glucose: 99.67 mg/dL

## 2017-06-22 LAB — TROPONIN I: Troponin I: 0.03 ng/mL (ref <0.03)

## 2017-06-22 LAB — MRSA PCR SCREENING: MRSA BY PCR: NEGATIVE

## 2017-06-22 MED ORDER — ZOLPIDEM TARTRATE 5 MG PO TABS
5.0000 mg | ORAL_TABLET | Freq: Every evening | ORAL | Status: DC | PRN
  Administered 2017-06-22 – 2017-06-29 (×8): 10 mg via ORAL
  Filled 2017-06-22 (×8): qty 2

## 2017-06-22 MED ORDER — ALBUTEROL SULFATE (2.5 MG/3ML) 0.083% IN NEBU
2.5000 mg | INHALATION_SOLUTION | Freq: Four times a day (QID) | RESPIRATORY_TRACT | Status: DC | PRN
  Administered 2017-06-22 – 2017-06-24 (×3): 2.5 mg via RESPIRATORY_TRACT
  Filled 2017-06-22 (×3): qty 3

## 2017-06-22 NOTE — Progress Notes (Signed)
[facility] Progress Note  Subjective: no new c/o's today, coughing a lot, tmax 100  Vitals:   06/22/17 0509 06/22/17 0916 06/22/17 0932 06/22/17 1300  BP: (!) 114/45   (!) 122/57  Pulse: 82   89  Resp: (!) 26   (!) 24  Temp: 98.1 F (36.7 C)   97.8 F (36.6 C)  TempSrc: Oral   Oral  SpO2: 97% 97% 97% 100%  Weight:      Height:        Inpatient medications: . aspirin EC  325 mg Oral Daily  . Darunavir-Cobicisctat-Emtricitabine-Tenofovir Alafenamide  1 tablet Oral Q breakfast  . dextromethorphan-guaiFENesin  1 tablet Oral BID  . dolutegravir  50 mg Oral Daily  . enoxaparin (LOVENOX) injection  40 mg Subcutaneous Q24H  . ferrous sulfate  325 mg Oral Q breakfast  . fluconazole  200 mg Oral Daily  . gabapentin  100 mg Oral TID  . ipratropium-albuterol  3 mL Nebulization TID  . magnesium oxide  400 mg Oral BID  . valACYclovir  500 mg Oral Daily   . ampicillin-sulbactam (UNASYN) IV Stopped (06/22/17 1215)  . vancomycin Stopped (06/22/17 0640)   acetaminophen **OR** acetaminophen, albuterol, bisacodyl, guaiFENesin-dextromethorphan, ibuprofen, ketorolac, nitroGLYCERIN, ondansetron **OR** ondansetron (ZOFRAN) IV, polyethylene glycol  Exam: Constitutional: no distress, significant wet coughing  Neck:  No nuchal rigidity, no masses Respiratory: diminished at the left lower lobe and left mid back with egophony, dull to percussion, no wheezes or rhonchi  CV: Regular rate and rhythm, no murmurs / rubs / gallops.  2+ radial pulses. Abdomen:  Normal active bowel sounds, soft, nondistended, nontender Musculoskeletal: Normal muscle tone and bulk.  No contractures.  Skin:  no rashes, abrasions, or ulcers Neurologic:  CN 2-12 grossly intact. Sensation intact to light touch, strength 5/5 throughout Psychiatric:  Alert and oriented x 3. Normal affect.      Brief Summary: 52 y.o. 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  Echocardiogram was negative for vegetation.  He was placed on IV antibiotics during his hospitalization and then transitioned to oral amoxicillin at the time of discharge.  He filled his prescription and was feeling well at the time that he went home.  The day prior to admission he started developing a worsening cough that was initially productive of some foamy phlegm then productive of a brownish green phlegm.  He has had worsening shortness of breath was unable to sleep.  He felt wiped out and exhausted.  Albuterol did not help him.  He also had some posttussive emesis and possibly some emesis aside from that but he denied diarrhea. Prior to his last hospitalization he has had some left shoulder pain that radiates across his chest.  On admit eval was having mostly epigastric soreness and some mid back soreness/pain that he attributes to coughing.  He did have shortness of breath, nausea.  He has a brother who has had quadruple bypass in his 4s.  ED Course: Vital signs: Patient was afebrile and hemodynamically stable but tachypneic to the mid 20s satting 100% on room air.  Labs: Mild hypokalemia, troponin of 0.14, white blood cell count of 21.6 which is up from 10.9 on 4/4.  CT of the chest demonstrated his previously known left lower lobe pneumonia but now he has  a 9.5 x 7.8 x 10.3 cm necrotic lung with abscess.  Normal sinus rhythm on EKG.       Impression/Plan:  Active Problems:   Lung abscess (HCC)   1) Respiratory distress/ recent PNA due to strep PNA/ new lung abscess by CT - no longer having any distress - cont vancomycin and unasyn IV - patient refusing to be seen by ID - have consulted Pulm / CCM - trend WBC  2) Elevated troponin, likely related to lung abscess/infection.  Repeat trops x 3 were all negative. No  further w/u. Just had ECHO done a couple days ago  3) Asthma, stable.   -  Started scheduled duonebs  4) AOZ:HYQMVHHIV:stable, cont to monitor clinically. Last CD4 and viral counts were good.  - continue symtuza and tivicay  5) Hypokalemia, mild -  Oral KCl repletion  6) Iron deficiency anemia (ferritin low in 01/2017) - start oral iron supplementation -  Repeat anemia panel in a month  7)  Lung nodules:3613mm RUL and RLL nodules, needs outpatient followup.  -outpatient CT chest in 3 months recommended. Will see if CCM comments on this  8) Hx of C. Diff -  Monitor for diarrhea  9) GERD, stable, cont protonix  DVT prophylaxis: lovenox  Code Status: DNR Family Communication:  Patient and his partner Disposition Plan:  not clear, await CCM rec's Consults called: PULM Admission status: inpatient, telemetry     Vinson Moselleob Jamaar Howes MD [facility] pgr [phone] 06/22/2017, 2:16 PM   Recent Labs  Lab 06/18/17 0613 06/21/17 0748 06/22/17 0222  NA 135 135 137  K 3.6 3.2* 3.5  CL 108 106 112*  CO2 20* 17* 15*  GLUCOSE 104* 98 93  BUN 14 15 12   CREATININE 0.99 0.90 0.90  CALCIUM 8.4* 9.1 8.3*   Recent Labs  Lab 06/21/17 0748  AST 20  ALT 27  ALKPHOS 120  BILITOT 0.6  PROT 9.3*  ALBUMIN 3.0*   Recent Labs  Lab 06/18/17 0613 06/21/17 0748 06/22/17 0222  WBC 10.9* 21.6* 23.5*  HGB 10.0* 11.4* 10.0*  HCT 29.9* 33.6* 31.2*  MCV 79.1 78.9 81.7  PLT 166 407* 427*   Iron/TIBC/Ferritin/ %Sat    Component Value Date/Time   IRON 28 (L) 01/31/2017 0440   TIBC 274 01/31/2017 0440   FERRITIN 17 (L) 01/31/2017 0440   IRONPCTSAT 10 (L) 01/31/2017 0440

## 2017-06-22 NOTE — Progress Notes (Signed)
PULMONARY / CRITICAL CARE MEDICINE   Name: [name] MRN: [id number] DOB: 12/24/1965    ADMISSION DATE:  06/21/2017 CONSULTATION DATE: June 22, 2017  REFERRING MD: Dr. Arlean Hopping  CHIEF COMPLAINT: Shortness of breath, cough  HISTORY OF PRESENT ILLNESS:   52 year old male with HIV who was just hospitalized at [facility] for strep community-acquired pneumonia with bacteremia came to our facility with increasing cough and shortness of breath.  He tells me that in late March 2019 he developed pneumonia.  At that time he had cough, yellow to gray mucus production fevers and chills.  During his hospitalization at [facility] he was found to be bacteremic from strep pneumonia.  Infectious diseases was consulted.  He was found to have a left lower lobe infiltrate and some patchy consolidation in the right lung as well.  During that time he had an echocardiogram which showed no evidence of endocarditis.  Blood cultures eventually cleared.  He was transitioned to oral Levaquin from IV Levaquin and was discharged home.  He says around the time that he was discharged home he felt "okay" but he developed worsening shortness of breath and cough.  He said some fevers and chills at home.  He says he is now producing a dark brown mucus which she did not have in the past.  Since coming to our facility he has been treated with IV antibiotics and he says he is starting to feel better again.  He continues to have a cough and some mucus production but is not coughing up that much mucus.  He has headache body aches and pain in the left side of his chest.  PAST MEDICAL HISTORY :  He  has a past medical history of HIV (human immunodeficiency virus infection) (HCC), MRSA (methicillin resistant staph aureus) culture positive, and Streptococcal pneumonia (HCC).  PAST SURGICAL HISTORY: He  has a past surgical history that includes hip arthroplasty (Left, 2016).  Allergies  Allergen Reactions  . Sulfa Antibiotics  Rash  . Tramadol Nausea Only    No current facility-administered medications on file prior to encounter.    Current Outpatient Medications on File Prior to Encounter  Medication Sig  . albuterol (PROVENTIL HFA;VENTOLIN HFA) 108 (90 Base) MCG/ACT inhaler Inhale 2 puffs into the lungs every 6 (six) hours as needed for wheezing or shortness of breath.  Marland Kitchen amoxicillin (AMOXIL) 500 MG capsule Take 1 capsule (500 mg total) by mouth every 8 (eight) hours for 12 days.  . Darunavir-Cobicisctat-Emtricitabine-Tenofovir Alafenamide (SYMTUZA) 800-150-200-10 MG TABS Take 1 tablet by mouth daily with breakfast.  . dextromethorphan-guaiFENesin (MUCINEX DM) 30-600 MG 12hr tablet Take 1 tablet by mouth 2 (two) times daily.  . dolutegravir (TIVICAY) 50 MG tablet Take 1 tablet (50 mg total) by mouth daily.  . fluconazole (DIFLUCAN) 200 MG tablet Take 1 tablet (200 mg total) by mouth daily.  Marland Kitchen gabapentin (NEURONTIN) 100 MG capsule Take 1 capsule (100 mg total) by mouth 3 (three) times daily.  Marland Kitchen ibuprofen (ADVIL,MOTRIN) 800 MG tablet Take 1 tablet (800 mg total) by mouth every 8 (eight) hours as needed for moderate pain.  Marland Kitchen MAGNESIUM OXIDE PO Take 2 tablets by mouth 2 (two) times daily.  . valACYclovir (VALTREX) 500 MG tablet Take 1 tablet (500 mg total) by mouth daily.  Marland Kitchen VITAMIN D, ERGOCALCIFEROL, PO Take 1 tablet by mouth daily.    FAMILY HISTORY:  His indicated that the status of his mother is unknown. He indicated that his father is deceased.  He indicated that the status of his brother is unknown.   SOCIAL HISTORY: He  reports that he has quit smoking. His smokeless tobacco use includes snuff. He reports that he does not drink alcohol or use drugs.  REVIEW OF SYSTEMS:   Gen: per HPI HEENT: Denies blurred vision, double vision, hearing loss, tinnitus, sinus congestion, rhinorrhea, sore throat, neck stiffness, dysphagia PULM: per HPI CV: Denies chest pain, edema, orthopnea, paroxysmal nocturnal dyspnea,  palpitations GI: Denies abdominal pain, nausea, vomiting, diarrhea, hematochezia, melena, constipation, change in bowel habits GU: Denies dysuria, hematuria, polyuria, oliguria, urethral discharge Endocrine: Denies hot or cold intolerance, polyuria, polyphagia or appetite change Derm: Denies rash, dry skin, scaling or peeling skin change Heme: Denies easy bruising, bleeding, bleeding gums Neuro: Denies headache, numbness, weakness, slurred speech, loss of memory or consciousness   SUBJECTIVE:  As above  VITAL SIGNS: BP (!) 122/57 (BP Location: Left Arm)   Pulse 89   Temp 97.8 F (36.6 C) (Oral)   Resp (!) 24   Ht 6\' 2"  (1.88 m)   Wt 185 lb (83.9 kg)   SpO2 100%   BMI 23.75 kg/m   HEMODYNAMICS:    VENTILATOR SETTINGS:    INTAKE / OUTPUT: I/O last 3 completed shifts: In: 1290 [P.O.:240; IV Piggyback:1050] Out: 850 [Urine:850]  PHYSICAL EXAMINATION:  General:  Resting comfortably in bed HENT: NCAT OP clear PULM: Diminished left base B, normal effort CV: RRR, no mgr GI: BS+, soft, nontender MSK: normal bulk and tone Neuro: awake, alert, no distress, MAEW   LABS:  BMET Recent Labs  Lab 06/18/17 0613 06/21/17 0748 06/22/17 0222  NA 135 135 137  K 3.6 3.2* 3.5  CL 108 106 112*  CO2 20* 17* 15*  BUN 14 15 12   CREATININE 0.99 0.90 0.90  GLUCOSE 104* 98 93    Electrolytes Recent Labs  Lab 06/16/17 0731  06/17/17 0730 06/18/17 0613 06/21/17 0748 06/22/17 0222  CALCIUM 8.3*   < >  --  8.4* 9.1 8.3*  MG 1.9  --  1.7  --   --   --    < > = values in this interval not displayed.    CBC Recent Labs  Lab 06/18/17 0613 06/21/17 0748 06/22/17 0222  WBC 10.9* 21.6* 23.5*  HGB 10.0* 11.4* 10.0*  HCT 29.9* 33.6* 31.2*  PLT 166 407* 427*    Coag's No results for input(s): APTT, INR in the last 168 hours.  Sepsis Markers No results for input(s): LATICACIDVEN, PROCALCITON, O2SATVEN in the last 168 hours.  ABG No results for input(s): PHART,  PCO2ART, PO2ART in the last 168 hours.  Liver Enzymes Recent Labs  Lab 06/21/17 0748  AST 20  ALT 27  ALKPHOS 120  BILITOT 0.6  ALBUMIN 3.0*    Cardiac Enzymes Recent Labs  Lab 06/21/17 1538 06/21/17 1915 06/22/17 0222  TROPONINI <0.03 <0.03 0.03*    Glucose No results for input(s): GLUCAP in the last 168 hours.  Imaging No results found.   STUDIES:  June 21, 2017 CT chest images independently reviewed showing a large central abscess in the left lower lobe, it is not clear to me if this communicates with the pleural space, there is a small pleural effusion posteriorly there is still dense consolidation in the left lower lobe and some patchy airspace disease in the right  CULTURES: April 7 blood culture  ANTIBIOTICS: April 7 Unasyn April 7 vancomycin  SIGNIFICANT EVENTS:   LINES/TUBES:   DISCUSSION: This is  a pleasant 52 year old male with HIV who comes to our hospital with recurrent respiratory trouble, specifically acute respiratory distress and cough.  He has a new left lower lobe abscess and consolidation in the left lower lobe.  He has a trace pleural effusion on that side.  I believe that this is likely just a left lower lobe abscess but I have concerned that it may communicate with the pleural space.  It is very difficult to tell based on the location on the CT scan.  At this point the best treatment is IV antibiotics but if he develops signs and symptoms of an empyema he will need to have that drained.  Left lower lobe infiltrate  ASSESSMENT / PLAN:  PULMONARY A: Left lower lobe infiltrate/Community-acquired pneumonia Left lower lobe abscess Small pleural effusion on the left P:   Plan ultrasound of left chest morning of April 9, if there is a pleural effusion we will need to perform a thoracentesis If pleural fluid is suggestive of an empyema then he will need drainage, given the lung abscess would prefer surgical drainage Continue IV antibiotics as  you are doing, I agree with broad-spectrum coverage with Unasyn and staph coverage with MRSA. Clarified CODE STATUS with patient, he says that if he starts to die "then he thinks it is time for guide to take him home and he does not want [place] to interfere".  Heber [place], MD [facility] Pager: [phone] Cell: [phone] After 3pm or if no response, call [phone]    06/22/2017, 4:18 PM

## 2017-06-22 NOTE — Progress Notes (Signed)
Asked to deliver treatment to patient who has clear bbs and normal vital signs on room air.  Pt. Begins to say he has labored breathing, he claims to stop breathing and then has distress.  Questioned of pt. Has OSA or other sleep disorders but denies any of the above.  Pt. Has no signs of distress.  Treatment given as requested.  RN aware and agrees with the writer regarding no signs of distress.

## 2017-06-22 NOTE — Progress Notes (Signed)
Patient has albuterol inhaler at the bedside and states that he uses it when he cant breath. RT asked him to let someone know when he uses it. RT explained to patient that we have a PRN treatment if he needs it.

## 2017-06-22 NOTE — Progress Notes (Signed)
CRITICAL VALUE ALERT  Critical Value:  TROPONIN 0.03  Date & Time Notied:  06/22/17 16100814  Provider Notified: RUEAVWUSCHERTZ   Orders Received/Actions taken: PROVIDER TO ASSESS

## 2017-06-23 ENCOUNTER — Inpatient Hospital Stay (HOSPITAL_COMMUNITY): Payer: Medicare Other

## 2017-06-23 DIAGNOSIS — B20 Human immunodeficiency virus [HIV] disease: Secondary | ICD-10-CM

## 2017-06-23 DIAGNOSIS — J851 Abscess of lung with pneumonia: Secondary | ICD-10-CM

## 2017-06-23 LAB — BASIC METABOLIC PANEL
Anion gap: 10 (ref 5–15)
BUN: 10 mg/dL (ref 6–20)
CO2: 17 mmol/L — ABNORMAL LOW (ref 22–32)
Calcium: 8.2 mg/dL — ABNORMAL LOW (ref 8.9–10.3)
Chloride: 110 mmol/L (ref 101–111)
Creatinine, Ser: 0.91 mg/dL (ref 0.61–1.24)
GFR calc Af Amer: >60 mL/min (ref >60)
GLUCOSE: 102 mg/dL — AB (ref 65–99)
POTASSIUM: 3.2 mmol/L — AB (ref 3.5–5.1)
Sodium: 137 mmol/L (ref 135–145)

## 2017-06-23 LAB — CBC
HCT: 24.9 % — ABNORMAL LOW (ref 39.0–52.0)
Hemoglobin: 8.1 g/dL — ABNORMAL LOW (ref 13.0–17.0)
MCH: 25.8 pg — AB (ref 26.0–34.0)
MCHC: 32.5 g/dL (ref 30.0–36.0)
MCV: 79.3 fL (ref 78.0–100.0)
PLATELETS: 450 10*3/uL — AB (ref 150–400)
RBC: 3.14 MIL/uL — AB (ref 4.22–5.81)
RDW: 16.8 % — ABNORMAL HIGH (ref 11.5–15.5)
WBC: 10.7 10*3/uL — ABNORMAL HIGH (ref 4.0–10.5)

## 2017-06-23 MED ORDER — BOOST / RESOURCE BREEZE PO LIQD CUSTOM: 1.0000 | Freq: Three times a day (TID) | ORAL | Status: DC

## 2017-06-23 MED ORDER — POTASSIUM CHLORIDE CRYS ER 20 MEQ PO TBCR
30.0000 meq | EXTENDED_RELEASE_TABLET | Freq: Two times a day (BID) | ORAL | Status: AC
  Administered 2017-06-23 – 2017-06-24 (×4): 30 meq via ORAL
  Filled 2017-06-23 (×4): qty 1

## 2017-06-23 MED ORDER — BOOST PLUS PO LIQD
237.0000 mL | Freq: Three times a day (TID) | ORAL | Status: DC
  Administered 2017-06-23 – 2017-06-28 (×4): 237 mL via ORAL
  Filled 2017-06-23 (×22): qty 237

## 2017-06-23 NOTE — Progress Notes (Signed)
[facility] Progress Note  Subjective: no new c/o's today, for procedure soon  Vitals:   06/22/17 1951 06/23/17 0129 06/23/17 0506 06/23/17 0847  BP: (!) 116/51  108/66   Pulse: 92  77 83  Resp: (!) 30  20 17   Temp: 98.3 F (36.8 C)  98.4 F (36.9 C)   TempSrc: Oral  Oral   SpO2: 98% 98% 98% 92%  Weight:      Height:        Inpatient medications: . aspirin EC  325 mg Oral Daily  . Darunavir-Cobicisctat-Emtricitabine-Tenofovir Alafenamide  1 tablet Oral Q breakfast  . dextromethorphan-guaiFENesin  1 tablet Oral BID  . dolutegravir  50 mg Oral Daily  . enoxaparin (LOVENOX) injection  40 mg Subcutaneous Q24H  . ferrous sulfate  325 mg Oral Q breakfast  . fluconazole  200 mg Oral Daily  . gabapentin  100 mg Oral TID  . ipratropium-albuterol  3 mL Nebulization TID  . magnesium oxide  400 mg Oral BID  . valACYclovir  500 mg Oral Daily   . ampicillin-sulbactam (UNASYN) IV Stopped (06/23/17 1109)  . vancomycin Stopped (06/23/17 0541)   acetaminophen **OR** acetaminophen, albuterol, bisacodyl, guaiFENesin-dextromethorphan, ibuprofen, ketorolac, nitroGLYCERIN, ondansetron **OR** ondansetron (ZOFRAN) IV, polyethylene glycol, zolpidem  Exam: Constitutional: no distress, significant wet coughing  Neck:  No nuchal rigidity, no masses Respiratory: diminished at the left lower lobe and left mid back with egophony, dull to percussion, no wheezes or rhonchi  CV: Regular rate and rhythm, no murmurs / rubs / gallops.  2+ radial pulses. Abdomen:  Normal active bowel sounds, soft, nondistended, nontender Musculoskeletal: Normal muscle tone and bulk.  No contractures.  Skin:  no rashes, abrasions, or ulcers Neurologic:  CN 2-12 grossly intact. Sensation intact to light touch, strength 5/5 throughout Psychiatric:  Alert and oriented x 3. Normal affect.      Brief Summary: 52 y.o. 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  Echocardiogram was negative for vegetation.  He was placed on IV antibiotics during his hospitalization and then transitioned to oral amoxicillin at the time of discharge.  He filled his prescription and was feeling well at the time that he went home.  The day prior to admission he started developing a worsening cough that was initially productive of some foamy phlegm then productive of a brownish green phlegm.  He has had worsening shortness of breath was unable to sleep.  He felt wiped out and exhausted.  Albuterol did not help him.  He also had some posttussive emesis and possibly some emesis aside from that but he denied diarrhea. Prior to his last hospitalization he has had some left shoulder pain that radiates across his chest.  On admit eval was having mostly epigastric soreness and some mid back soreness/pain that he attributes to coughing.  He did have shortness of breath, nausea.  He has a brother who has had quadruple bypass in his 6140s.  ED Course: Vital signs: Patient was afebrile and hemodynamically stable but tachypneic to the mid 20s satting 100% on room air.  Labs: Mild hypokalemia, troponin of 0.14, white blood cell count of 21.6 which is up from 10.9 on 4/4.  CT of the chest demonstrated his previously known left lower lobe pneumonia but now he has a 9.5 x  7.8 x 10.3 cm necrotic lung with abscess.  Normal sinus rhythm on EKG.       Impression/Plan:  Active Problems:   Lung abscess (HCC)   1) Respiratory distress/ recent PNA due to strep PNA/ new lung abscess by CT - no longer in distress - WBC down to 10K today, from 23K ! - cont vancomycin and unasyn IV - patient refusing to be seen by ID - appreciate Pulm service assistance - for procedure today, cont same IV abx per CCM  2) Elevated troponin- likely  related to lung abscess/infection.  Repeat trops x 3 were all negative. No further w/u. Just had ECHO done a couple days ago  3) Asthma- stable.   -  Started scheduled duonebs  4) WUJ:WJXBJY, cont to monitor clinically. Last CD4 and viral counts were good.  - continue symtuza and tivicay  5) Hypokalemia, mild -  still low, ^ oral KCl repletion  6) Iron deficiency anemia (ferritin low in 01/2017) - start oral iron supplementation - repeat anemia panel in a month  7)  Lung nodules:53mm RUL and RLL nodules, needs outpatient followup.  -outpatient CT chest in 3 months recommended. Will see if CCM comments on this  8) Hx of C. Diff -  Monitor for diarrhea  9) GERD, stable, cont protonix  DVT prophylaxis: lovenox  Code Status: DNR Family Communication: no family here Disposition Plan:  not clear, await CCM rec's Consults called: PULM Admission status: inpatient, telemetry     Vinson Moselle MD [facility] pgr [phone] 06/23/2017, 11:23 AM   Recent Labs  Lab 06/21/17 0748 06/22/17 0222 06/23/17 0454  NA 135 137 137  K 3.2* 3.5 3.2*  CL 106 112* 110  CO2 17* 15* 17*  GLUCOSE 98 93 102*  BUN 15 12 10   CREATININE 0.90 0.90 0.91  CALCIUM 9.1 8.3* 8.2*   Recent Labs  Lab 06/21/17 0748  AST 20  ALT 27  ALKPHOS 120  BILITOT 0.6  PROT 9.3*  ALBUMIN 3.0*   Recent Labs  Lab 06/21/17 0748 06/22/17 0222 06/23/17 0454  WBC 21.6* 23.5* 10.7*  HGB 11.4* 10.0* 8.1*  HCT 33.6* 31.2* 24.9*  MCV 78.9 81.7 79.3  PLT 407* 427* 450*   Iron/TIBC/Ferritin/ %Sat    Component Value Date/Time   IRON 28 (L) 01/31/2017 0440   TIBC 274 01/31/2017 0440   FERRITIN 17 (L) 01/31/2017 0440   IRONPCTSAT 10 (L) 01/31/2017 0440

## 2017-06-23 NOTE — Progress Notes (Addendum)
PULMONARY / CRITICAL CARE MEDICINE   Name: [name] MRN: [id number] DOB: 02/16/1966    ADMISSION DATE:  06/21/2017 CONSULTATION DATE: June 22, 2017  REFERRING MD: Dr. Arlean HoppingSchertz  CHIEF COMPLAINT: Shortness of breath, cough  HISTORY OF PRESENT ILLNESS:   52 y/o male with HIV and a lung abscess.    SUBJECTIVE:  Feels a little better Can take a deeper breath today Coughing out brown mucus  VITAL SIGNS: BP 118/62 (BP Location: Left Arm)   Pulse 76   Temp 97.8 F (36.6 C) (Oral)   Resp 18   Ht 6\' 2"  (1.88 m)   Wt 83.9 kg (185 lb)   SpO2 99%   BMI 23.75 kg/m   HEMODYNAMICS:    VENTILATOR SETTINGS:    INTAKE / OUTPUT: I/O last 3 completed shifts: In: 4700 [P.O.:3000; IV Piggyback:1700] Out: 3775 [Urine:3775]  PHYSICAL EXAMINATION:  Gen: well appearing HENT: OP clear, TM's clear, neck supple PULM: Diminished R base B, normal percussion CV: RRR, no mgr, trace edema GI: BS+, soft, nontender Derm: no cyanosis or rash Psyche: normal mood and affect    LABS:  BMET Recent Labs  Lab 06/21/17 0748 06/22/17 0222 06/23/17 0454  NA 135 137 137  K 3.2* 3.5 3.2*  CL 106 112* 110  CO2 17* 15* 17*  BUN 15 12 10   CREATININE 0.90 0.90 0.91  GLUCOSE 98 93 102*    Electrolytes Recent Labs  Lab 06/17/17 0730  06/21/17 0748 06/22/17 0222 06/23/17 0454  CALCIUM  --    < > 9.1 8.3* 8.2*  MG 1.7  --   --   --   --    < > = values in this interval not displayed.    CBC Recent Labs  Lab 06/21/17 0748 06/22/17 0222 06/23/17 0454  WBC 21.6* 23.5* 10.7*  HGB 11.4* 10.0* 8.1*  HCT 33.6* 31.2* 24.9*  PLT 407* 427* 450*    Coag's No results for input(s): APTT, INR in the last 168 hours.  Sepsis Markers No results for input(s): LATICACIDVEN, PROCALCITON, O2SATVEN in the last 168 hours.  ABG No results for input(s): PHART, PCO2ART, PO2ART in the last 168 hours.  Liver Enzymes Recent Labs  Lab 06/21/17 0748  AST 20  ALT 27  ALKPHOS 120  BILITOT  0.6  ALBUMIN 3.0*    Cardiac Enzymes Recent Labs  Lab 06/21/17 1538 06/21/17 1915 06/22/17 0222  TROPONINI <0.03 <0.03 0.03*    Glucose No results for input(s): GLUCAP in the last 168 hours.  Imaging No results found.   STUDIES:  June 21, 2017 CT chest images independently reviewed showing a large central abscess in the left lower lobe, it is not clear to me if this communicates with the pleural space, there is a small pleural effusion posteriorly there is still dense consolidation in the left lower lobe and some patchy airspace disease in the right April 9 Ultrasound R chest : only trace pleural fluid seen  CULTURES: April 7 blood culture  ANTIBIOTICS: April 7 Unasyn April 7 vancomycin  SIGNIFICANT EVENTS:   LINES/TUBES:   DISCUSSION: 52 y/o male with HIV and now a right lower lobe lung abscess who feels a little better on IV antibiotics. No evidence of a significant effusion on ultrasound chest on 4/9.  So presumably we are just dealing with a lung abscess and not empyema.  Lung abscess should be treated medically unless he develops an empyema.    ASSESSMENT / PLAN:  PULMONARY A: Left  lower lobe infiltrate/Community-acquired pneumonia Left lower lobe abscess Small pleural effusion on the left P:   Plan every other day CXR until 4/15, then repeat CT chest> if abscess small then place PICC and have him d/c on 3 weeks of IV antibiotics with ID and pulmonary follow up If he develops evidence of a new pleural effusion, would perform ultrasound chest and perform thoracentesis if appropriate Nutrition is important: add boost with meals Mobility is important: walk 3 times per day, mobilize secretions  Will be available PRN  Heber [place], MD [facility] Pager: [phone] Cell: [phone] After 3pm or if no response, call [phone]    06/23/2017, 3:40 PM

## 2017-06-23 NOTE — Progress Notes (Signed)
Patient ID: Joseph ReekLarry D Rojek, male   DOB: 08/01/1965, 52 y.o.   MRN: [id number] Pt presented to US dept today for left thoracentesis. On limited US left post chest there is only a trace effusion noted, not safely accessible at this time for aspiration. Pt informed.

## 2017-06-24 ENCOUNTER — Inpatient Hospital Stay (HOSPITAL_COMMUNITY): Payer: Medicare Other

## 2017-06-24 LAB — BASIC METABOLIC PANEL
ANION GAP: 9 (ref 5–15)
BUN: 13 mg/dL (ref 6–20)
CALCIUM: 8.4 mg/dL — AB (ref 8.9–10.3)
CO2: 19 mmol/L — AB (ref 22–32)
Chloride: 112 mmol/L — ABNORMAL HIGH (ref 101–111)
Creatinine, Ser: 0.89 mg/dL (ref 0.61–1.24)
GFR calc Af Amer: >60 mL/min (ref >60)
GFR calc non Af Amer: >60 mL/min (ref >60)
GLUCOSE: 100 mg/dL — AB (ref 65–99)
Potassium: 3.9 mmol/L (ref 3.5–5.1)
Sodium: 140 mmol/L (ref 135–145)

## 2017-06-24 LAB — CBC
HEMATOCRIT: 26.8 % — AB (ref 39.0–52.0)
Hemoglobin: 8.6 g/dL — ABNORMAL LOW (ref 13.0–17.0)
MCH: 25.5 pg — ABNORMAL LOW (ref 26.0–34.0)
MCHC: 32.1 g/dL (ref 30.0–36.0)
MCV: 79.5 fL (ref 78.0–100.0)
Platelets: 510 10*3/uL — ABNORMAL HIGH (ref 150–400)
RBC: 3.37 MIL/uL — ABNORMAL LOW (ref 4.22–5.81)
RDW: 16.5 % — ABNORMAL HIGH (ref 11.5–15.5)
WBC: 6.1 10*3/uL (ref 4.0–10.5)

## 2017-06-24 LAB — VANCOMYCIN, TROUGH: Vancomycin Tr: 14 ug/mL — ABNORMAL LOW (ref 15–20)

## 2017-06-24 LAB — VANCOMYCIN, PEAK: Vancomycin Pk: 22 ug/mL — ABNORMAL LOW (ref 30–40)

## 2017-06-24 MED ORDER — HYDROCORTISONE ACETATE 25 MG RE SUPP
25.0000 mg | Freq: Two times a day (BID) | RECTAL | Status: DC
  Filled 2017-06-24 (×3): qty 1

## 2017-06-24 NOTE — Progress Notes (Signed)
PROGRESS NOTE        PATIENT DETAILS Name: [name] Age: 52 y.o. Sex: male Date of Birth: 06/01/1965 Admit Date: 06/21/2017 Admitting Physician Renae FickleMackenzie Short, MD ZOX:[id number]:Patient, No Pcp Per  Brief Narrative: Patient is a 52 y.o. male prior history of HIV (CD4 count of 280 on 06/07/17)-recent hospitalization from 3/31-4/4 due to sepsis-pneumonia-pneumococcal bacteremia-sent home on oral amoxicillin-presented to the hospital with worsening cough, shortness of breath-found to have a lung abscess-and admitted to the hospitalist service.  See below for further details  Subjective: Lying comfortably in bed-cough and shortness of breath have markedly improved.  Assessment/Plan: Left lung abscess: Continue empiric vancomycin and Unasyn-thoracocentesis attempted on 4/9-but only minimal/trace effusion noted.  Recommendations from pulmonology every other day chest x-ray until 4/15-repeat CT chest then-if abscess is small, then place a PICC line and have him discharged on 3 weeks of IV antimicrobial therapy.  If patient develops a pleural effusion in the meantime, would need a thoracocentesis.  Minimally elevated troponin: Trend is flat-not consistent with ACS-recent echocardiogram showed preserved EF.  Doubt any further workup required.  One episode of hematochezia: Per RN this occurred yesterday evening-suspect hemorrhoidal bleed-start Anusol suppository-monitor CBC.  If worsens or has persistent hematochezia-we can contemplate further workup.  HIV: Continue antiretrovirals-outpatient follow-up with infectious disease.  Asthma: Stable-continue bronchodilators  Iron deficiency anemia: Continue iron supplementation-repeat anemia panel in 1 month  Lung nodules: We will require outpatient CT chest in 3 months.  GERD: Continue PPI  DVT Prophylaxis: Prophylactic Lovenox   Code Status: Full code   Family Communication: None at bedside  Disposition Plan: Remain  inpatient-home sometime next week  Antimicrobial agents: Anti-infectives (From admission, onward)   Start     Dose/Rate Route Frequency Ordered Stop   06/21/17 1700  Darunavir-Cobicisctat-Emtricitabine-Tenofovir Alafenamide (SYMTUZA) 800-150-200-10 MG TABS 1 tablet     1 tablet Oral Daily with breakfast 06/21/17 1347     06/21/17 1700  dolutegravir (TIVICAY) tablet 50 mg     50 mg Oral Daily 06/21/17 1347     06/21/17 1700  Ampicillin-Sulbactam (UNASYN) 3 g in sodium chloride 0.9 % 100 mL IVPB     3 g 200 mL/hr over 30 Minutes Intravenous Every 6 hours 06/21/17 1417     06/21/17 1700  vancomycin (VANCOCIN) 1,250 mg in sodium chloride 0.9 % 250 mL IVPB     1,250 mg 166.7 mL/hr over 90 Minutes Intravenous Every 12 hours 06/21/17 1417     06/21/17 1600  fluconazole (DIFLUCAN) tablet 200 mg     200 mg Oral Daily 06/21/17 1347     06/21/17 1600  valACYclovir (VALTREX) tablet 500 mg     500 mg Oral Daily 06/21/17 1347     06/21/17 1015  vancomycin (VANCOCIN) IVPB 1000 mg/200 mL premix     1,000 mg 200 mL/hr over 60 Minutes Intravenous  Once 06/21/17 1004 06/21/17 1233   06/21/17 1015  piperacillin-tazobactam (ZOSYN) IVPB 3.375 g     3.375 g 100 mL/hr over 30 Minutes Intravenous  Once 06/21/17 1004 06/21/17 1233      Procedures: None  CONSULTS:  pulmonary/intensive care  Time spent: 25- minutes-Greater than 50% of this time was spent in counseling, explanation of diagnosis, planning of further management, and coordination of care.  MEDICATIONS: Scheduled Meds: . aspirin EC  325 mg Oral Daily  . Darunavir-Cobicisctat-Emtricitabine-Tenofovir Alafenamide  1 tablet Oral Q breakfast  . dextromethorphan-guaiFENesin  1 tablet Oral BID  . dolutegravir  50 mg Oral Daily  . enoxaparin (LOVENOX) injection  40 mg Subcutaneous Q24H  . ferrous sulfate  325 mg Oral Q breakfast  . fluconazole  200 mg Oral Daily  . gabapentin  100 mg Oral TID  . hydrocortisone  25 mg Rectal BID  .  ipratropium-albuterol  3 mL Nebulization TID  . lactose free nutrition  237 mL Oral TID WC  . magnesium oxide  400 mg Oral BID  . potassium chloride  30 mEq Oral BID  . valACYclovir  500 mg Oral Daily   Continuous Infusions: . ampicillin-sulbactam (UNASYN) IV Stopped (06/24/17 1211)  . vancomycin Stopped (06/24/17 0735)   PRN Meds:.acetaminophen **OR** acetaminophen, albuterol, bisacodyl, guaiFENesin-dextromethorphan, ibuprofen, ketorolac, nitroGLYCERIN, ondansetron **OR** ondansetron (ZOFRAN) IV, polyethylene glycol, zolpidem   PHYSICAL EXAM: Vital signs: Vitals:   06/23/17 2131 06/24/17 0544 06/24/17 0831 06/24/17 1158  BP: (!) 114/57 121/77  126/75  Pulse: 65 74  72  Resp: 17 19 18 18   Temp: 98.1 F (36.7 C) 97.6 F (36.4 C)  97.8 F (36.6 C)  TempSrc: Oral Oral  Oral  SpO2: 99% 98% 99% 99%  Weight:      Height:       Filed Weights   06/21/17 0626 06/21/17 1532  Weight: 88 kg (194 lb) 83.9 kg (185 lb)   Body mass index is 23.75 kg/m.   General appearance :Awake, alert, not in any distress.  Eyes:, pupils equally reactive to light and accomodation HEENT: Atraumatic and Normocephalic Neck: supple, no JVD.  Resp:Good air entry bilaterally, no added sounds  CVS: S1 S2 regular, no murmurs.  GI: Bowel sounds present, Non tender and not distended with no gaurding, rigidity or rebound.No organomegaly Extremities: B/L Lower Ext shows no edema, both legs are warm to touch Neurology:  speech clear,Non focal, sensation is grossly intact. Psychiatric: Normal judgment and insight. Alert and oriented x 3. Normal mood. Musculoskeletal:No digital cyanosis Skin:No Rash, warm and dry Wounds:N/A  I have personally reviewed following labs and imaging studies  LABORATORY DATA: CBC: Recent Labs  Lab 06/18/17 0613 06/21/17 0748 06/22/17 0222 06/23/17 0454 06/24/17 0447  WBC 10.9* 21.6* 23.5* 10.7* 6.1  HGB 10.0* 11.4* 10.0* 8.1* 8.6*  HCT 29.9* 33.6* 31.2* 24.9* 26.8*  MCV  79.1 78.9 81.7 79.3 79.5  PLT 166 407* 427* 450* 510*    Basic Metabolic Panel: Recent Labs  Lab 06/18/17 0613 06/21/17 0748 06/22/17 0222 06/23/17 0454 06/24/17 0447  NA 135 135 137 137 140  K 3.6 3.2* 3.5 3.2* 3.9  CL 108 106 112* 110 112*  CO2 20* 17* 15* 17* 19*  GLUCOSE 104* 98 93 102* 100*  BUN 14 15 12 10 13   CREATININE 0.99 0.90 0.90 0.91 0.89  CALCIUM 8.4* 9.1 8.3* 8.2* 8.4*    GFR: Estimated Creatinine Clearance: 115.4 mL/min (by C-G formula based on SCr of 0.89 mg/dL).  Liver Function Tests: Recent Labs  Lab 06/21/17 0748  AST 20  ALT 27  ALKPHOS 120  BILITOT 0.6  PROT 9.3*  ALBUMIN 3.0*   No results for input(s): LIPASE, AMYLASE in the last 168 hours. No results for input(s): AMMONIA in the last 168 hours.  Coagulation Profile: No results for input(s): INR, PROTIME in the last 168 hours.  Cardiac Enzymes: Recent Labs  Lab 06/21/17 0748 06/21/17 1538 06/21/17 1915 06/22/17 0222  TROPONINI 0.14* <0.03 <0.03 0.03*  BNP (last 3 results) No results for input(s): PROBNP in the last 8760 hours.  HbA1C: Recent Labs    06/22/17 0222  HGBA1C 5.1    CBG: No results for input(s): GLUCAP in the last 168 hours.  Lipid Profile: Recent Labs    06/22/17 0222  CHOL 94  HDL 13*  LDLCALC 71  TRIG 52  CHOLHDL 7.2    Thyroid Function Tests: No results for input(s): TSH, T4TOTAL, FREET4, T3FREE, THYROIDAB in the last 72 hours.  Anemia Panel: No results for input(s): VITAMINB12, FOLATE, FERRITIN, TIBC, IRON, RETICCTPCT in the last 72 hours.  Urine analysis: No results found for: COLORURINE, APPEARANCEUR, LABSPEC, PHURINE, GLUCOSEU, HGBUR, BILIRUBINUR, KETONESUR, PROTEINUR, UROBILINOGEN, NITRITE, LEUKOCYTESUR  Sepsis Labs: Lactic Acid, Venous    Component Value Date/Time   LATICACIDVEN 0.85 06/14/2017 1301    MICROBIOLOGY: Recent Results (from the past 240 hour(s))  MRSA PCR Screening     Status: None   Collection Time: 06/16/17   5:37 AM  Result Value Ref Range Status   MRSA by PCR NEGATIVE NEGATIVE Final    Comment:        The GeneXpert MRSA Assay (FDA approved for NASAL specimens only), is one component of a comprehensive MRSA colonization surveillance program. It is not intended to diagnose MRSA infection nor to guide or monitor treatment for MRSA infections. Performed at [facility], [address], [place], [place] [address]   Blood culture (routine x 2)     Status: None (Preliminary result)   Collection Time: 06/21/17  7:49 AM  Result Value Ref Range Status   Specimen Description   Final    BLOOD RIGHT ARM Performed at [facility], [address] [place], [place] [address]    Special Requests   Final    BOTTLES DRAWN AEROBIC AND ANAEROBIC Blood Culture results may not be optimal due to an excessive volume of blood received in culture bottles Performed at [facility], [address], [place], [place] [address]    Culture   Final    NO GROWTH 2 DAYS Performed at [facility], [address], [place], [place] [address]    Report Status PENDING  Incomplete  Blood culture (routine x 2)     Status: None (Preliminary result)   Collection Time: 06/21/17  9:31 AM  Result Value Ref Range Status   Specimen Description   Final    BLOOD RIGHT ANTECUBITAL Performed at [facility], [address], [place], [place] [address]    Special Requests   Final    BOTTLES DRAWN AEROBIC AND ANAEROBIC Blood Culture results may not be optimal due to an excessive volume of blood received in culture bottles Performed at [facility], [address] [place], [place] [address]    Culture   Final    NO GROWTH 2 DAYS Performed at [facility], [address], [place], [place] [address]    Report Status PENDING  Incomplete  MRSA PCR Screening     Status: None   Collection Time: 06/22/17  3:46 AM  Result Value Ref Range Status   MRSA by PCR NEGATIVE  NEGATIVE Final    Comment:        The GeneXpert MRSA Assay (FDA approved for NASAL specimens only), is one component of a comprehensive MRSA colonization surveillance program. It is not intended to diagnose MRSA infection nor to guide or monitor treatment for MRSA infections. Performed  at [facility], [address], [place], [place] [address]     RADIOLOGY STUDIES/RESULTS: Dg Chest 2 View  Result Date: 06/24/2017 CLINICAL DATA:  Productive cough, shortness of breath, HIV, previous episode of pneumonia. EXAM: CHEST - 2 VIEW COMPARISON:  Chest x-ray of April 7th 2019 and chest CT scan of the same day FINDINGS: The right lung is clear. There is an air-fluid level in a cavitary mass at the right lung base. This is better organized than on the previous study. The heart and pulmonary vascularity are normal. The mediastinum is normal in width. There is no pneumothorax. The bony thorax is unremarkable. IMPRESSION: Right lower lobe lung abscess with an air-fluid level. This is slightly better organized today. Electronically Signed   By: David  [place] M.D.   On: 06/24/2017 11:13   Dg Chest 2 View  Result Date: 06/21/2017 CLINICAL DATA:  Pneumonia.  Shortness of breath. EXAM: CHEST - 2 VIEW COMPARISON:  June 16, 2017 FINDINGS: The infiltrate in the left lower lobe appears mildly worsened in the interval. Mild opacity in the medial right base could represent subtle infiltrate versus vascular crowding. No pneumothorax. The heart, hila, and mediastinum are normal. Air-fluid levels are seen behind the heart on the lateral view. No hiatal hernia was seen on the CT scan from June 14, 2017. IMPRESSION: 1. The infiltrate in the left base appears mildly worsened in the interval. Vascular crowding versus subtle infiltrate in the medial right base. 2. An air-fluid level is projected behind the heart on the lateral view is identified. No hiatal hernia was identified on the CT scan from June 14, 2017 an air-fluid level could be associated with the infiltrate. Recommend a CT scan for better evaluation. Electronically Signed   By: Gerome Sam III M.D   On: 06/21/2017 07:56   Dg Chest 2 View  Result Date: 06/16/2017 CLINICAL DATA:  Chest pain, shortness of breath, history of HIV EXAM: CHEST - 2 VIEW COMPARISON:  CT chest of 06/14/2017 and chest x-ray of the same day FINDINGS: There is little change in dense consolidation of the left lower lobe posteriorly consistent with left lower lobe pneumonia. Otherwise the lungs are clear. Mediastinal and hilar contours are stable. The heart is within normal limits in size. No acute bony abnormality is seen. IMPRESSION: No change in dense consolidation of the left lower lobe consistent with pneumonia. Electronically Signed   By: Dwyane Dee M.D.   On: 06/16/2017 08:10   Dg Chest 2 View  Result Date: 06/14/2017 CLINICAL DATA:  Cough, chest pain and shortness of breath for 4 days. EXAM: CHEST - 2 VIEW COMPARISON:  09/19/2009 and prior chest radiographs FINDINGS: Airspace disease/consolidation involving much of the LEFT LOWER lobe identified likely representing pneumonia. Mild ill-defined opacities within the mid and lower RIGHT lung may represent developing airspace disease/pneumonia. Cardiomediastinal silhouette is otherwise unchanged. There may be a trace LEFT pleural effusion present. There is no evidence of pneumothorax or acute bony abnormality. IMPRESSION: Airspace disease/consolidation involving the majority of the LEFT LOWER lobe likely representing pneumonia. Mild opacities within the mid and lower RIGHT lung which may represent developing airspace disease/pneumonia. Radiographic follow-up to resolution is recommended. Electronically Signed   By: Harmon Pier M.D.   On: 06/14/2017 07:42   Ct Chest W Contrast  Result Date: 06/21/2017 CLINICAL DATA:  Pneumonia.  HIV positive. EXAM: CT CHEST WITH CONTRAST TECHNIQUE: Multidetector CT imaging of the chest  was performed during intravenous contrast administration. CONTRAST:  75mL OMNIPAQUE IOHEXOL 300 MG/ML  SOLN COMPARISON:  CT scan June 14, 2017 FINDINGS: Cardiovascular: The thoracic aorta is nonaneurysmal with no dissection or atherosclerotic change. The heart is unchanged. The central pulmonary artery measures 3.8 cm. No obvious filling defects. Mediastinum/Nodes: Thyroid and esophagus are normal. Shotty nodes in the mediastinum without adenopathy are likely reactive. Lungs/Pleura: Central airways are normal. The patient's known left lower lobe pneumonia is again identified. There is a rounded consolidated component as seen on series 7, image 100 measuring 9.5 by 7.8 by 10.3 cm containing an air-fluid level consistent with necrotic lung/abscess. This accounts for the recent chest x-ray finding. Mild patchy ground-glass opacity in the right upper lobe is likely infectious, less focal in the interval. There is a left-sided pleural effusion which is small, extending into the left fissure. Upper Abdomen: No acute abnormality. Musculoskeletal: No chest wall abnormality. No acute or significant osseous findings. IMPRESSION: 1. Left lower lobe pneumonia. There is now a large rounded collection within the infiltrate containing air-fluid levels consistent with necrosis/abscess formation. 2. Mild patchy infectious change in the right upper lobe. 3. Reactive nodes in the mediastinum. 4. The main pulmonary artery measures 3.8 cm which is mildly dilated raising the possibility of pulmonary hypertension. 5. No other abnormalities. Electronically Signed   By: Gerome Sam III M.D   On: 06/21/2017 11:03   Ct Angio Chest Pe W And/or Wo Contrast  Result Date: 06/14/2017 CLINICAL DATA:  Acute generalized abdominal pain EXAM: CT ANGIOGRAPHY CHEST CT ABDOMEN AND PELVIS WITH CONTRAST TECHNIQUE: Multidetector CT imaging of the chest was performed using the standard protocol during bolus administration of intravenous contrast.  Multiplanar CT image reconstructions and MIPs were obtained to evaluate the vascular anatomy. Multidetector CT imaging of the abdomen and pelvis was performed using the standard protocol during bolus administration of intravenous contrast. CONTRAST:  ISOVUE-370 IOPAMIDOL (ISOVUE-370) INJECTION 76% COMPARISON:  None. FINDINGS: CTA CHEST FINDINGS Cardiovascular: Satisfactory opacification of the pulmonary arteries to the segmental level. No evidence of pulmonary embolism. Normal heart size. No pericardial effusion. Mediastinum/Nodes: Enlarged subcarinal lymph node measuring 13 mm. Mild left hilar lymphadenopathy likely reactive. No axillary lymphadenopathy. Normal trachea and esophagus. Normal thyroid gland. Lungs/Pleura: Dense left lower lobe consolidation. 13 mm right lower lobe pulmonary nodule. 13 mm right upper lobe pulmonary nodule. Hazy airspace disease in the right upper lobe. Small left pleural effusion. No pneumothorax. Musculoskeletal: No acute osseous abnormality. No aggressive osseous lesion. Review of the MIP images confirms the above findings. CT ABDOMEN and PELVIS FINDINGS Hepatobiliary: No focal liver abnormality is seen. No gallstones, gallbladder wall thickening, or biliary dilatation. Pancreas: Unremarkable. No pancreatic ductal dilatation or surrounding inflammatory changes. Spleen: Multiple calcifications in the spleen as can be seen with prior granulomatous disease. Adrenals/Urinary Tract: Adrenal glands are unremarkable. No urolithiasis or obstructive uropathy. 18 mm hypodense, fluid attenuating right interpolar renal mass most consistent with a cyst. Bladder is unremarkable. Stomach/Bowel: Small hiatal hernia. Stomach is otherwise within normal limits. No evidence of bowel wall thickening, distention, or inflammatory changes. Vascular/Lymphatic: Normal caliber abdominal aorta. Bilateral periaortic calcified lymph node as can be seen with prior granulomatous disease. Reproductive: Prostate is  unremarkable. Other: No abdominal wall hernia or abnormality. No abdominopelvic ascites. Musculoskeletal: No aggressive osseous lesion. Left total hip arthroplasty. No acute osseous abnormality. Severe osteoarthritis of the right hip. Review of the MIP images confirms the above findings. IMPRESSION: 1. No pulmonary embolus. 2. Dense left lower lobe pneumonia. 3. Mild hazy right upper lobe airspace disease  concerning for pneumonia. 4. 13 mm right upper lobe and right lower lobe pulmonary nodules which may be secondary to an inflammatory or neoplastic etiology. Recommend follow-up CT of the chest in 3 months. Electronically Signed   By: Elige Ko   On: 06/14/2017 12:41   Ct Pelvis Wo Contrast  Result Date: 06/07/2017 CLINICAL DATA:  52 year old male with right hip pain. EXAM: CT PELVIS WITHOUT CONTRAST TECHNIQUE: Multidetector CT imaging of the pelvis was performed following the standard protocol without intravenous contrast. COMPARISON:  Pelvic radiograph dated 04/13/2017 and CT of the abdomen pelvis dated 10/21/2013. FINDINGS: Urinary Tract: The visualized distal ureters and urinary bladder appear unremarkable. Bowel: No bowel dilatation or active inflammation within the pelvis. Vascular/Lymphatic: The visualized vasculature appear unremarkable. A top-normal left external iliac chain lymph node measures 1 cm in short axis, of indeterminate clinical significance, likely reactive. Reproductive: The prostate and seminal vesicles are grossly unremarkable. Other:  None Musculoskeletal: There is a total left arthroplasty. The arthroplasty components appear intact and in anatomic alignment. There is heterotopic bone formation adjacent to the left greater trochanter. The appearance of the arthroplasty and acetabular screws are similar to the prior CT. There is osteopenia of the left hip primarily adjacent to the acetabular cup, increased since the prior CT. There is severe osteoarthritis of the right hip with slight  flattening of the right femoral head and narrowing of the joint space similar or slightly progressed compared to the CT of 2015. There is no acute fracture. No dislocation. IMPRESSION: Severe osteoarthritic changes of the right hip similar or slightly progressed compared to the prior CT. Mild flattening and fragmentation of the right femoral head may be sequela of underlying avascular necrosis. Left hip arthroplasty appears intact. Progression of osteopenia surrounding the acetabular cup of the left hip. No acute fracture. Electronically Signed   By: Elgie Collard M.D.   On: 06/07/2017 22:29   [place] Chest (pleural Effusion)  Result Date: 06/23/2017 CLINICAL DATA:  52 year old male who presents for left-sided thoracentesis. EXAM: CHEST ULTRASOUND COMPARISON:  CT scan of the chest 06/21/2017 FINDINGS: Sonographic interrogation of the left pleural space demonstrates trace pleural fluid. There is insufficient fluid for safe thoracentesis. The procedure was deferred. IMPRESSION: Trace left-sided pleural effusion, insufficient for thoracentesis. Electronically Signed   By: Malachy Moan M.D.   On: 06/23/2017 17:13   Ct Abdomen Pelvis W Contrast  Result Date: 06/14/2017 CLINICAL DATA:  Acute generalized abdominal pain EXAM: CT ANGIOGRAPHY CHEST CT ABDOMEN AND PELVIS WITH CONTRAST TECHNIQUE: Multidetector CT imaging of the chest was performed using the standard protocol during bolus administration of intravenous contrast. Multiplanar CT image reconstructions and MIPs were obtained to evaluate the vascular anatomy. Multidetector CT imaging of the abdomen and pelvis was performed using the standard protocol during bolus administration of intravenous contrast. CONTRAST:  ISOVUE-370 IOPAMIDOL (ISOVUE-370) INJECTION 76% COMPARISON:  None. FINDINGS: CTA CHEST FINDINGS Cardiovascular: Satisfactory opacification of the pulmonary arteries to the segmental level. No evidence of pulmonary embolism. Normal heart size. No  pericardial effusion. Mediastinum/Nodes: Enlarged subcarinal lymph node measuring 13 mm. Mild left hilar lymphadenopathy likely reactive. No axillary lymphadenopathy. Normal trachea and esophagus. Normal thyroid gland. Lungs/Pleura: Dense left lower lobe consolidation. 13 mm right lower lobe pulmonary nodule. 13 mm right upper lobe pulmonary nodule. Hazy airspace disease in the right upper lobe. Small left pleural effusion. No pneumothorax. Musculoskeletal: No acute osseous abnormality. No aggressive osseous lesion. Review of the MIP images confirms the above findings. CT ABDOMEN and PELVIS FINDINGS  Hepatobiliary: No focal liver abnormality is seen. No gallstones, gallbladder wall thickening, or biliary dilatation. Pancreas: Unremarkable. No pancreatic ductal dilatation or surrounding inflammatory changes. Spleen: Multiple calcifications in the spleen as can be seen with prior granulomatous disease. Adrenals/Urinary Tract: Adrenal glands are unremarkable. No urolithiasis or obstructive uropathy. 18 mm hypodense, fluid attenuating right interpolar renal mass most consistent with a cyst. Bladder is unremarkable. Stomach/Bowel: Small hiatal hernia. Stomach is otherwise within normal limits. No evidence of bowel wall thickening, distention, or inflammatory changes. Vascular/Lymphatic: Normal caliber abdominal aorta. Bilateral periaortic calcified lymph node as can be seen with prior granulomatous disease. Reproductive: Prostate is unremarkable. Other: No abdominal wall hernia or abnormality. No abdominopelvic ascites. Musculoskeletal: No aggressive osseous lesion. Left total hip arthroplasty. No acute osseous abnormality. Severe osteoarthritis of the right hip. Review of the MIP images confirms the above findings. IMPRESSION: 1. No pulmonary embolus. 2. Dense left lower lobe pneumonia. 3. Mild hazy right upper lobe airspace disease concerning for pneumonia. 4. 13 mm right upper lobe and right lower lobe pulmonary  nodules which may be secondary to an inflammatory or neoplastic etiology. Recommend follow-up CT of the chest in 3 months. Electronically Signed   By: Elige Ko   On: 06/14/2017 12:41   Dg Knee Complete 4 Views Right  Result Date: 06/07/2017 CLINICAL DATA:  Knee pain EXAM: RIGHT KNEE - COMPLETE 4+ VIEW COMPARISON:  None. FINDINGS: No evidence of fracture, dislocation, or joint effusion. No evidence of arthropathy or other focal bone abnormality. Soft tissues are unremarkable. IMPRESSION: Negative. Electronically Signed   By: Kennith Center M.D.   On: 06/07/2017 22:38   Dg Femur, Min 2 Views Right  Result Date: 06/07/2017 CLINICAL DATA:  Pain. EXAM: RIGHT FEMUR 2 VIEWS COMPARISON:  None. FINDINGS: Marked loss of joint space with subchondral sclerosis and subchondral cyst formation noted in the acetabulum and femoral head. Bones are demineralized. No evidence for femur fracture. No suspicious lytic or sclerotic osseous abnormality within the femur. 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 Electronically Signed   By: Kennith Center M.D.   On: 06/07/2017 20:35     LOS: 3 days   Jeoffrey Massed, MD  [facility] Pager:[phone]  If 7PM-7AM, please contact night-coverage [email or web address] Password [email or web address] 06/24/2017, 1:13 PM

## 2017-06-24 NOTE — Care Management Important Message (Signed)
Important Message  Patient Details  Name: [name] MRN: [id number] Date of Birth: 02/09/1966   Medicare Important Message Given:  Yes    Caren MacadamFuller, Marleny Faller 06/24/2017, 10:49 AMImportant Message  Patient Details  Name: [name] MRN: [id number] Date of Birth: 11/16/1965   Medicare Important Message Given:  Yes    Caren MacadamFuller, Franceen Erisman 06/24/2017, 10:49 AM

## 2017-06-24 NOTE — Progress Notes (Addendum)
Pharmacy - Brief Note Vancomycin level follow-up  Following levels drawn today with pharmacokinetic calculations  4/10 1719 = 14 mcg/mL (given vanco 1250mg  IV q12h - dose at 17:54)  4/10 2030 pk = 22 mcg/mL - calculates a Ke = 0.0514, Pk = 23.3, tr = 13.5 and AUC = 433.7   Goal AUC = 400-550  Plan:  Current vancomycin dose attains therapeutic AUC.  Room to potentially increase if patient clinically warranted.  Will continue vancomycin 1250mg  IV q12h as patient is afebrile and WBC improved.    [name], PharmD, BCPS.   Pager: [phone] 06/24/2017 9:31 PM

## 2017-06-24 NOTE — Progress Notes (Signed)
Pharmacy Antibiotic Note  Joseph ReekLarry D Haas is a 52 y.o. male with hx HIV and recently hospitalized for strep pneumococcal bacteremia/PNA.  He was discharged on 06/18/17 with 12 day course of augmentin. He presented back to the ED on 06/21/2017 with c/o SOB.  Vancomycin and unasyn for PNA/lung abscess.  - 4/7 Chest CT: Left lower lobe pneumonia. There is now a large rounded collection within the infiltrate containing air-fluid levels consistent with necrosis/abscess formation.  06/24/2017  Afebrile, WBC WNL, SCr stable, no need to tap pleural effusion 4/9. MRSA PCR negative, BCx ngtd.  CCM plan: QOD CXR until 4/15, then repeat CT chest > if abscess small then place PICC and dc on 3 weeks IV abx  Plan: - unasyn 3gm IV q6h - vancomycin 1250 mg IV q12h for est AUC 460 (goal 400-500) Check vancomycin peak and trough today to assess regimen ___________________________________  Height: 6\' 2"  (188 cm) Weight: 185 lb (83.9 kg) IBW/kg (Calculated) : 82.2  Temp (24hrs), Avg:97.8 F (36.6 C), Min:97.6 F (36.4 C), Max:98.1 F (36.7 C)  Recent Labs  Lab 06/18/17 0613 06/21/17 0748 06/22/17 0222 06/23/17 0454 06/24/17 0447  WBC 10.9* 21.6* 23.5* 10.7* 6.1  CREATININE 0.99 0.90 0.90 0.91 0.89    Estimated Creatinine Clearance: 115.4 mL/min (by C-G formula based on SCr of 0.89 mg/dL).    Allergies  Allergen Reactions  . Sulfa Antibiotics Rash  . Tramadol Nausea Only  Antimicrobials this admission:  4/7 zosyn x1 4/7 vanc>> 4/7 unasyn>>  Dose adjustments this admission:  4/10 1730 tr 4/10 2030 pk   Microbiology results:  3/31 bcx x2: 2/2 strep pneum (pans sens) 4/2 MRSA PCR: neg 4/7 flu neg 4/8 MRSA PCR neg 4/7 BCx x2: ngtd    Thank you for allowing pharmacy to be a part of this patient's care.  Herby AbrahamMichelle T. Azael Ragain, Pharm.D. [phone] 06/24/2017 8:19 AM

## 2017-06-24 NOTE — Progress Notes (Signed)
During shift assessment this am, the patient reported to RN that he had a bowel movement on 06/23/17 at about 8p and that he noted bright red blood streaks and what appeared to be a clot. Hbg stable was 8.1 on 4/9 and is up to 8.6 on 4/10. MD made aware of patient's report. Will treat for possible hemorrhoids. Patient made aware of treatment plan and at that time he reported to the RN that he had a BM at about 10am today and that he did not see any blood and that his stool appeared normal. Patient declined anusol suppository at this time. Patient instructed to save any BM that has blood in it and to report it to the RN for assessment. Patient agreeable.   Arta BruceDeutsch, Johnny Latu [facility] EdgewoodDEBRULER 06/24/2017 11:52 AM

## 2017-06-25 ENCOUNTER — Inpatient Hospital Stay (HOSPITAL_COMMUNITY): Payer: Medicare Other

## 2017-06-25 ENCOUNTER — Encounter (HOSPITAL_COMMUNITY): Payer: Self-pay

## 2017-06-25 LAB — BASIC METABOLIC PANEL
Anion gap: 9 (ref 5–15)
BUN: 9 mg/dL (ref 6–20)
CO2: 20 mmol/L — ABNORMAL LOW (ref 22–32)
Calcium: 8.5 mg/dL — ABNORMAL LOW (ref 8.9–10.3)
Chloride: 109 mmol/L (ref 101–111)
Creatinine, Ser: 0.9 mg/dL (ref 0.61–1.24)
GFR calc Af Amer: >60 mL/min (ref >60)
Glucose, Bld: 91 mg/dL (ref 65–99)
POTASSIUM: 4.2 mmol/L (ref 3.5–5.1)
Sodium: 138 mmol/L (ref 135–145)

## 2017-06-25 LAB — CBC
HEMATOCRIT: 28.8 % — AB (ref 39.0–52.0)
HEMOGLOBIN: 9.1 g/dL — AB (ref 13.0–17.0)
MCH: 25.6 pg — ABNORMAL LOW (ref 26.0–34.0)
MCHC: 31.6 g/dL (ref 30.0–36.0)
MCV: 81.1 fL (ref 78.0–100.0)
Platelets: 605 10*3/uL — ABNORMAL HIGH (ref 150–400)
RBC: 3.55 MIL/uL — AB (ref 4.22–5.81)
RDW: 16.6 % — AB (ref 11.5–15.5)
WBC: 7.1 10*3/uL (ref 4.0–10.5)

## 2017-06-25 MED ORDER — HYDROCOD POLST-CPM POLST ER 10-8 MG/5ML PO SUER
5.0000 mL | Freq: Two times a day (BID) | ORAL | Status: DC | PRN
  Administered 2017-06-25: 5 mL via ORAL
  Filled 2017-06-25: qty 5

## 2017-06-25 MED ORDER — BENZONATATE 100 MG PO CAPS: 200.0000 mg | ORAL_CAPSULE | Freq: Three times a day (TID) | ORAL | Status: DC | PRN

## 2017-06-25 MED ORDER — IPRATROPIUM-ALBUTEROL 0.5-2.5 (3) MG/3ML IN SOLN
3.0000 mL | Freq: Two times a day (BID) | RESPIRATORY_TRACT | Status: DC
  Administered 2017-06-27: 3 mL via RESPIRATORY_TRACT
  Filled 2017-06-25 (×2): qty 3

## 2017-06-25 MED ORDER — BENZONATATE 100 MG PO CAPS
200.0000 mg | ORAL_CAPSULE | Freq: Three times a day (TID) | ORAL | Status: DC | PRN
  Administered 2017-06-25 – 2017-06-27 (×2): 200 mg via ORAL
  Filled 2017-06-25 (×2): qty 2

## 2017-06-25 NOTE — Progress Notes (Addendum)
PROGRESS NOTE        PATIENT DETAILS Name: [name] Age: 52 y.o. Sex: male Date of Birth: 08-17-1965 Admit Date: 06/21/2017 Admitting Physician Renae Fickle, MD ZOX:[id number], No Pcp Per  Brief Narrative: Patient is a 52 y.o. male prior history of HIV (CD4 count of 280 on 06/07/17)-recent hospitalization from 3/31-4/4 due to sepsis-pneumonia-pneumococcal bacteremia-sent home on oral amoxicillin-presented to the hospital with worsening cough, shortness of breath-found to have a lung abscess-and admitted to the hospitalist service.  See below for further details  Subjective: Continues to cough-but no chest pain or shortness of breath.  Sleeping comfortably when I walked in this morning.  Assessment/Plan: Left lung abscess: Overall improved-main issue seems to be ongoing coughing.  No shortness of breath.  Continue with empiric vancomycin and Unasyn.  Thoracocentesis was attempted on 4/9, but only minimal pleural effusion seen-and hence not pursued any further.  Seen by pulmonology during this hospital stay, with recommendations are to do a chest x-ray every other day until 4/15, following which CT chest will be planned on 4/15-if lung abscess has improved-patient will then be discharged on 3 weeks of IV antimicrobial therapy.  PICC line will be need to be placed.  In the meantime, if patient develops a difficult pleural effusion-thoracocentesis will need to be done.  Patient does not wish to follow-up with infectious disease here in [place], he will now follow-up with ID at [facility].  Minimally elevated troponin: Trend is flat-not consistent with ACS-recent echocardiogram showed preserved EF.  Doubt any further workup required.  One episode of hematochezia: Occurred on 4/9-none since then.  Suspect hemorrhoids.  Refused Anusol.  HIV: Continue antiretrovirals-outpatient follow-up with infectious disease.  See above regarding patient's plan to  change outpatient providers.  Asthma: Table-continue with bronchodilators  Iron deficiency anemia: Iron supplementation-repeat anemia panel in 1 month.  Defer endoscopic evaluation if not done to the outpatient setting.    Lung nodules: Will require outpatient CT chest in 3 months.   GERD: Continue PPI  DVT Prophylaxis: Prophylactic Lovenox   Code Status: Full code   Family Communication: None at bedside  Disposition Plan: Remain inpatient-home sometime next week  Antimicrobial agents: Anti-infectives (From admission, onward)   Start     Dose/Rate Route Frequency Ordered Stop   06/21/17 1700  Darunavir-Cobicisctat-Emtricitabine-Tenofovir Alafenamide (SYMTUZA) 800-150-200-10 MG TABS 1 tablet     1 tablet Oral Daily with breakfast 06/21/17 1347     06/21/17 1700  dolutegravir (TIVICAY) tablet 50 mg     50 mg Oral Daily 06/21/17 1347     06/21/17 1700  Ampicillin-Sulbactam (UNASYN) 3 g in sodium chloride 0.9 % 100 mL IVPB     3 g 200 mL/hr over 30 Minutes Intravenous Every 6 hours 06/21/17 1417     06/21/17 1700  vancomycin (VANCOCIN) 1,250 mg in sodium chloride 0.9 % 250 mL IVPB     1,250 mg 166.7 mL/hr over 90 Minutes Intravenous Every 12 hours 06/21/17 1417     06/21/17 1600  fluconazole (DIFLUCAN) tablet 200 mg     200 mg Oral Daily 06/21/17 1347     06/21/17 1600  valACYclovir (VALTREX) tablet 500 mg     500 mg Oral Daily 06/21/17 1347     06/21/17 1015  vancomycin (VANCOCIN) IVPB 1000 mg/200 mL premix     1,000 mg 200  mL/hr over 60 Minutes Intravenous  Once 06/21/17 1004 06/21/17 1233   06/21/17 1015  piperacillin-tazobactam (ZOSYN) IVPB 3.375 g     3.375 g 100 mL/hr over 30 Minutes Intravenous  Once 06/21/17 1004 06/21/17 1233      Procedures: None  CONSULTS:  pulmonary/intensive care  Time spent: 25- minutes-Greater than 50% of this time was spent in counseling, explanation of diagnosis, planning of further management, and coordination of  care.  MEDICATIONS: Scheduled Meds: . aspirin EC  325 mg Oral Daily  . Darunavir-Cobicisctat-Emtricitabine-Tenofovir Alafenamide  1 tablet Oral Q breakfast  . dextromethorphan-guaiFENesin  1 tablet Oral BID  . dolutegravir  50 mg Oral Daily  . enoxaparin (LOVENOX) injection  40 mg Subcutaneous Q24H  . ferrous sulfate  325 mg Oral Q breakfast  . fluconazole  200 mg Oral Daily  . gabapentin  100 mg Oral TID  . hydrocortisone  25 mg Rectal BID  . ipratropium-albuterol  3 mL Nebulization BID  . lactose free nutrition  237 mL Oral TID WC  . magnesium oxide  400 mg Oral BID  . valACYclovir  500 mg Oral Daily   Continuous Infusions: . ampicillin-sulbactam (UNASYN) IV Stopped (06/25/17 1147)  . vancomycin Stopped (06/25/17 0724)   PRN Meds:.acetaminophen **OR** acetaminophen, albuterol, bisacodyl, guaiFENesin-dextromethorphan, ibuprofen, ketorolac, nitroGLYCERIN, ondansetron **OR** ondansetron (ZOFRAN) IV, polyethylene glycol, zolpidem   PHYSICAL EXAM: Vital signs: Vitals:   06/24/17 2235 06/25/17 0423 06/25/17 0805 06/25/17 1421  BP: 106/62 102/65  111/65  Pulse: 82 71  66  Resp: (!) 22 17  18   Temp: 98.8 F (37.1 C) 97.8 F (36.6 C)  98 F (36.7 C)  TempSrc: Oral Oral  Oral  SpO2: 98% 97% 97% 99%  Weight:      Height:       Filed Weights   06/21/17 0626 06/21/17 1532  Weight: 88 kg (194 lb) 83.9 kg (185 lb)   Body mass index is 23.75 kg/m.  General appearance :Awake, alert, not in any distress.  Eyes:, pupils equally reactive to light and accomodation,no scleral icterus. HEENT: Atraumatic and Normocephalic Neck: supple, no JVD. Resp:Good air entry bilaterally, no rales or rhonchi CVS: S1 S2 regular, no murmurs.  GI: Bowel sounds present, Non tender and not distended with no gaurding, rigidity or rebound. Extremities: B/L Lower Ext shows no edema, both legs are warm to touch Neurology:  speech clear,Non focal, sensation is grossly intact. Psychiatric: Normal judgment  and insight. Normal mood. Musculoskeletal:No digital cyanosis Skin:No Rash, warm and dry Wounds:N/A  I have personally reviewed following labs and imaging studies  LABORATORY DATA: CBC: Recent Labs  Lab 06/21/17 0748 06/22/17 0222 06/23/17 0454 06/24/17 0447 06/25/17 0443  WBC 21.6* 23.5* 10.7* 6.1 7.1  HGB 11.4* 10.0* 8.1* 8.6* 9.1*  HCT 33.6* 31.2* 24.9* 26.8* 28.8*  MCV 78.9 81.7 79.3 79.5 81.1  PLT 407* 427* 450* 510* 605*    Basic Metabolic Panel: Recent Labs  Lab 06/21/17 0748 06/22/17 0222 06/23/17 0454 06/24/17 0447 06/25/17 0443  NA 135 137 137 140 138  K 3.2* 3.5 3.2* 3.9 4.2  CL 106 112* 110 112* 109  CO2 17* 15* 17* 19* 20*  GLUCOSE 98 93 102* 100* 91  BUN 15 12 10 13 9   CREATININE 0.90 0.90 0.91 0.89 0.90  CALCIUM 9.1 8.3* 8.2* 8.4* 8.5*    GFR: Estimated Creatinine Clearance: 114.2 mL/min (by C-G formula based on SCr of 0.9 mg/dL).  Liver Function Tests: Recent Labs  Lab 06/21/17 [phone]  AST 20  ALT 27  ALKPHOS 120  BILITOT 0.6  PROT 9.3*  ALBUMIN 3.0*   No results for input(s): LIPASE, AMYLASE in the last 168 hours. No results for input(s): AMMONIA in the last 168 hours.  Coagulation Profile: No results for input(s): INR, PROTIME in the last 168 hours.  Cardiac Enzymes: Recent Labs  Lab 06/21/17 0748 06/21/17 1538 06/21/17 1915 06/22/17 0222  TROPONINI 0.14* <0.03 <0.03 0.03*    BNP (last 3 results) No results for input(s): PROBNP in the last 8760 hours.  HbA1C: No results for input(s): HGBA1C in the last 72 hours.  CBG: No results for input(s): GLUCAP in the last 168 hours.  Lipid Profile: No results for input(s): CHOL, HDL, LDLCALC, TRIG, CHOLHDL, LDLDIRECT in the last 72 hours.  Thyroid Function Tests: No results for input(s): TSH, T4TOTAL, FREET4, T3FREE, THYROIDAB in the last 72 hours.  Anemia Panel: No results for input(s): VITAMINB12, FOLATE, FERRITIN, TIBC, IRON, RETICCTPCT in the last 72 hours.  Urine  analysis: No results found for: COLORURINE, APPEARANCEUR, LABSPEC, PHURINE, GLUCOSEU, HGBUR, BILIRUBINUR, KETONESUR, PROTEINUR, UROBILINOGEN, NITRITE, LEUKOCYTESUR  Sepsis Labs: Lactic Acid, Venous    Component Value Date/Time   LATICACIDVEN 0.85 06/14/2017 1301    MICROBIOLOGY: Recent Results (from the past 240 hour(s))  MRSA PCR Screening     Status: None   Collection Time: 06/16/17  5:37 AM  Result Value Ref Range Status   MRSA by PCR NEGATIVE NEGATIVE Final    Comment:        The GeneXpert MRSA Assay (FDA approved for NASAL specimens only), is one component of a comprehensive MRSA colonization surveillance program. It is not intended to diagnose MRSA infection nor to guide or monitor treatment for MRSA infections. Performed at [facility], [address], [place], [place] [address]   Blood culture (routine x 2)     Status: None (Preliminary result)   Collection Time: 06/21/17  7:49 AM  Result Value Ref Range Status   Specimen Description   Final    BLOOD RIGHT ARM Performed at [facility], [address], [place], [place] [address]    Special Requests   Final    BOTTLES DRAWN AEROBIC AND ANAEROBIC Blood Culture results may not be optimal due to an excessive volume of blood received in culture bottles Performed at [facility], [address], [place], [place] [address]    Culture   Final    NO GROWTH 3 DAYS Performed at [facility], [address], [place], [place] [address]    Report Status PENDING  Incomplete  Blood culture (routine x 2)     Status: None (Preliminary result)   Collection Time: 06/21/17  9:31 AM  Result Value Ref Range Status   Specimen Description   Final    BLOOD RIGHT ANTECUBITAL Performed at [facility], [address] [place], [place] [address]    Special Requests   Final    BOTTLES DRAWN AEROBIC AND ANAEROBIC Blood Culture results may not be optimal due to an excessive volume of blood  received in culture bottles Performed at [facility], [address], [place], [place] [address]    Culture   Final    NO GROWTH 3 DAYS Performed at [facility], [address] [place], [place] [address]    Report Status PENDING  Incomplete  MRSA PCR Screening     Status: None   Collection Time:  06/22/17  3:46 AM  Result Value Ref Range Status   MRSA by PCR NEGATIVE NEGATIVE Final    Comment:        The GeneXpert MRSA Assay (FDA approved for NASAL specimens only), is one component of a comprehensive MRSA colonization surveillance program. It is not intended to diagnose MRSA infection nor to guide or monitor treatment for MRSA infections. Performed at [facility], [address], [place], [place] [address]     RADIOLOGY STUDIES/RESULTS: Dg Chest 2 View  Result Date: 06/24/2017 CLINICAL DATA:  Productive cough, shortness of breath, HIV, previous episode of pneumonia. EXAM: CHEST - 2 VIEW COMPARISON:  Chest x-ray of April 7th 2019 and chest CT scan of the same day FINDINGS: The right lung is clear. There is an air-fluid level in a cavitary mass at the right lung base. This is better organized than on the previous study. The heart and pulmonary vascularity are normal. The mediastinum is normal in width. There is no pneumothorax. The bony thorax is unremarkable. IMPRESSION: Right lower lobe lung abscess with an air-fluid level. This is slightly better organized today. Electronically Signed   By: David  [place] M.D.   On: 06/24/2017 11:13   Dg Chest 2 View  Result Date: 06/21/2017 CLINICAL DATA:  Pneumonia.  Shortness of breath. EXAM: CHEST - 2 VIEW COMPARISON:  June 16, 2017 FINDINGS: The infiltrate in the left lower lobe appears mildly worsened in the interval. Mild opacity in the medial right base could represent subtle infiltrate versus vascular crowding. No pneumothorax. The heart, hila, and mediastinum are normal. Air-fluid levels are  seen behind the heart on the lateral view. No hiatal hernia was seen on the CT scan from June 14, 2017. IMPRESSION: 1. The infiltrate in the left base appears mildly worsened in the interval. Vascular crowding versus subtle infiltrate in the medial right base. 2. An air-fluid level is projected behind the heart on the lateral view is identified. No hiatal hernia was identified on the CT scan from June 14, 2017 an air-fluid level could be associated with the infiltrate. Recommend a CT scan for better evaluation. Electronically Signed   By: Gerome Sam III M.D   On: 06/21/2017 07:56   Dg Chest 2 View  Result Date: 06/16/2017 CLINICAL DATA:  Chest pain, shortness of breath, history of HIV EXAM: CHEST - 2 VIEW COMPARISON:  CT chest of 06/14/2017 and chest x-ray of the same day FINDINGS: There is little change in dense consolidation of the left lower lobe posteriorly consistent with left lower lobe pneumonia. Otherwise the lungs are clear. Mediastinal and hilar contours are stable. The heart is within normal limits in size. No acute bony abnormality is seen. IMPRESSION: No change in dense consolidation of the left lower lobe consistent with pneumonia. Electronically Signed   By: Dwyane Dee M.D.   On: 06/16/2017 08:10   Dg Chest 2 View  Result Date: 06/14/2017 CLINICAL DATA:  Cough, chest pain and shortness of breath for 4 days. EXAM: CHEST - 2 VIEW COMPARISON:  09/19/2009 and prior chest radiographs FINDINGS: Airspace disease/consolidation involving much of the LEFT LOWER lobe identified likely representing pneumonia. Mild ill-defined opacities within the mid and lower RIGHT lung may represent developing airspace disease/pneumonia. Cardiomediastinal silhouette is otherwise unchanged. There may be a trace LEFT pleural effusion present. There is no evidence of pneumothorax or acute bony abnormality. IMPRESSION: Airspace disease/consolidation involving the majority of the LEFT LOWER lobe likely representing  pneumonia. Mild opacities within the  mid and lower RIGHT lung which may represent developing airspace disease/pneumonia. Radiographic follow-up to resolution is recommended. Electronically Signed   By: Harmon Pier M.D.   On: 06/14/2017 07:42   Ct Chest W Contrast  Result Date: 06/21/2017 CLINICAL DATA:  Pneumonia.  HIV positive. EXAM: CT CHEST WITH CONTRAST TECHNIQUE: Multidetector CT imaging of the chest was performed during intravenous contrast administration. CONTRAST:  75mL OMNIPAQUE IOHEXOL 300 MG/ML  SOLN COMPARISON:  CT scan June 14, 2017 FINDINGS: Cardiovascular: The thoracic aorta is nonaneurysmal with no dissection or atherosclerotic change. The heart is unchanged. The central pulmonary artery measures 3.8 cm. No obvious filling defects. Mediastinum/Nodes: Thyroid and esophagus are normal. Shotty nodes in the mediastinum without adenopathy are likely reactive. Lungs/Pleura: Central airways are normal. The patient's known left lower lobe pneumonia is again identified. There is a rounded consolidated component as seen on series 7, image 100 measuring 9.5 by 7.8 by 10.3 cm containing an air-fluid level consistent with necrotic lung/abscess. This accounts for the recent chest x-ray finding. Mild patchy ground-glass opacity in the right upper lobe is likely infectious, less focal in the interval. There is a left-sided pleural effusion which is small, extending into the left fissure. Upper Abdomen: No acute abnormality. Musculoskeletal: No chest wall abnormality. No acute or significant osseous findings. IMPRESSION: 1. Left lower lobe pneumonia. There is now a large rounded collection within the infiltrate containing air-fluid levels consistent with necrosis/abscess formation. 2. Mild patchy infectious change in the right upper lobe. 3. Reactive nodes in the mediastinum. 4. The main pulmonary artery measures 3.8 cm which is mildly dilated raising the possibility of pulmonary hypertension. 5. No other  abnormalities. Electronically Signed   By: Gerome Sam III M.D   On: 06/21/2017 11:03   Ct Angio Chest Pe W And/or Wo Contrast  Result Date: 06/14/2017 CLINICAL DATA:  Acute generalized abdominal pain EXAM: CT ANGIOGRAPHY CHEST CT ABDOMEN AND PELVIS WITH CONTRAST TECHNIQUE: Multidetector CT imaging of the chest was performed using the standard protocol during bolus administration of intravenous contrast. Multiplanar CT image reconstructions and MIPs were obtained to evaluate the vascular anatomy. Multidetector CT imaging of the abdomen and pelvis was performed using the standard protocol during bolus administration of intravenous contrast. CONTRAST:  ISOVUE-370 IOPAMIDOL (ISOVUE-370) INJECTION 76% COMPARISON:  None. FINDINGS: CTA CHEST FINDINGS Cardiovascular: Satisfactory opacification of the pulmonary arteries to the segmental level. No evidence of pulmonary embolism. Normal heart size. No pericardial effusion. Mediastinum/Nodes: Enlarged subcarinal lymph node measuring 13 mm. Mild left hilar lymphadenopathy likely reactive. No axillary lymphadenopathy. Normal trachea and esophagus. Normal thyroid gland. Lungs/Pleura: Dense left lower lobe consolidation. 13 mm right lower lobe pulmonary nodule. 13 mm right upper lobe pulmonary nodule. Hazy airspace disease in the right upper lobe. Small left pleural effusion. No pneumothorax. Musculoskeletal: No acute osseous abnormality. No aggressive osseous lesion. Review of the MIP images confirms the above findings. CT ABDOMEN and PELVIS FINDINGS Hepatobiliary: No focal liver abnormality is seen. No gallstones, gallbladder wall thickening, or biliary dilatation. Pancreas: Unremarkable. No pancreatic ductal dilatation or surrounding inflammatory changes. Spleen: Multiple calcifications in the spleen as can be seen with prior granulomatous disease. Adrenals/Urinary Tract: Adrenal glands are unremarkable. No urolithiasis or obstructive uropathy. 18 mm hypodense, fluid  attenuating right interpolar renal mass most consistent with a cyst. Bladder is unremarkable. Stomach/Bowel: Small hiatal hernia. Stomach is otherwise within normal limits. No evidence of bowel wall thickening, distention, or inflammatory changes. Vascular/Lymphatic: Normal caliber abdominal aorta. Bilateral periaortic calcified lymph node as  can be seen with prior granulomatous disease. Reproductive: Prostate is unremarkable. Other: No abdominal wall hernia or abnormality. No abdominopelvic ascites. Musculoskeletal: No aggressive osseous lesion. Left total hip arthroplasty. No acute osseous abnormality. Severe osteoarthritis of the right hip. Review of the MIP images confirms the above findings. IMPRESSION: 1. No pulmonary embolus. 2. Dense left lower lobe pneumonia. 3. Mild hazy right upper lobe airspace disease concerning for pneumonia. 4. 13 mm right upper lobe and right lower lobe pulmonary nodules which may be secondary to an inflammatory or neoplastic etiology. Recommend follow-up CT of the chest in 3 months. Electronically Signed   By: Elige KoHetal  Patel   On: 06/14/2017 12:41   Ct Pelvis Wo Contrast  Result Date: 06/07/2017 CLINICAL DATA:  52 year old male with right hip pain. EXAM: CT PELVIS WITHOUT CONTRAST TECHNIQUE: Multidetector CT imaging of the pelvis was performed following the standard protocol without intravenous contrast. COMPARISON:  Pelvic radiograph dated 04/13/2017 and CT of the abdomen pelvis dated 10/21/2013. FINDINGS: Urinary Tract: The visualized distal ureters and urinary bladder appear unremarkable. Bowel: No bowel dilatation or active inflammation within the pelvis. Vascular/Lymphatic: The visualized vasculature appear unremarkable. A top-normal left external iliac chain lymph node measures 1 cm in short axis, of indeterminate clinical significance, likely reactive. Reproductive: The prostate and seminal vesicles are grossly unremarkable. Other:  None Musculoskeletal: There is a total  left arthroplasty. The arthroplasty components appear intact and in anatomic alignment. There is heterotopic bone formation adjacent to the left greater trochanter. The appearance of the arthroplasty and acetabular screws are similar to the prior CT. There is osteopenia of the left hip primarily adjacent to the acetabular cup, increased since the prior CT. There is severe osteoarthritis of the right hip with slight flattening of the right femoral head and narrowing of the joint space similar or slightly progressed compared to the CT of 2015. There is no acute fracture. No dislocation. IMPRESSION: Severe osteoarthritic changes of the right hip similar or slightly progressed compared to the prior CT. Mild flattening and fragmentation of the right femoral head may be sequela of underlying avascular necrosis. Left hip arthroplasty appears intact. Progression of osteopenia surrounding the acetabular cup of the left hip. No acute fracture. Electronically Signed   By: Elgie CollardArash  Radparvar M.D.   On: 06/07/2017 22:29   [place] Chest (pleural Effusion)  Result Date: 06/23/2017 CLINICAL DATA:  52 year old male who presents for left-sided thoracentesis. EXAM: CHEST ULTRASOUND COMPARISON:  CT scan of the chest 06/21/2017 FINDINGS: Sonographic interrogation of the left pleural space demonstrates trace pleural fluid. There is insufficient fluid for safe thoracentesis. The procedure was deferred. IMPRESSION: Trace left-sided pleural effusion, insufficient for thoracentesis. Electronically Signed   By: Malachy MoanHeath  McCullough M.D.   On: 06/23/2017 17:13   Ct Abdomen Pelvis W Contrast  Result Date: 06/14/2017 CLINICAL DATA:  Acute generalized abdominal pain EXAM: CT ANGIOGRAPHY CHEST CT ABDOMEN AND PELVIS WITH CONTRAST TECHNIQUE: Multidetector CT imaging of the chest was performed using the standard protocol during bolus administration of intravenous contrast. Multiplanar CT image reconstructions and MIPs were obtained to evaluate the  vascular anatomy. Multidetector CT imaging of the abdomen and pelvis was performed using the standard protocol during bolus administration of intravenous contrast. CONTRAST:  ISOVUE-370 IOPAMIDOL (ISOVUE-370) INJECTION 76% COMPARISON:  None. FINDINGS: CTA CHEST FINDINGS Cardiovascular: Satisfactory opacification of the pulmonary arteries to the segmental level. No evidence of pulmonary embolism. Normal heart size. No pericardial effusion. Mediastinum/Nodes: Enlarged subcarinal lymph node measuring 13 mm. Mild left hilar lymphadenopathy likely  reactive. No axillary lymphadenopathy. Normal trachea and esophagus. Normal thyroid gland. Lungs/Pleura: Dense left lower lobe consolidation. 13 mm right lower lobe pulmonary nodule. 13 mm right upper lobe pulmonary nodule. Hazy airspace disease in the right upper lobe. Small left pleural effusion. No pneumothorax. Musculoskeletal: No acute osseous abnormality. No aggressive osseous lesion. Review of the MIP images confirms the above findings. CT ABDOMEN and PELVIS FINDINGS Hepatobiliary: No focal liver abnormality is seen. No gallstones, gallbladder wall thickening, or biliary dilatation. Pancreas: Unremarkable. No pancreatic ductal dilatation or surrounding inflammatory changes. Spleen: Multiple calcifications in the spleen as can be seen with prior granulomatous disease. Adrenals/Urinary Tract: Adrenal glands are unremarkable. No urolithiasis or obstructive uropathy. 18 mm hypodense, fluid attenuating right interpolar renal mass most consistent with a cyst. Bladder is unremarkable. Stomach/Bowel: Small hiatal hernia. Stomach is otherwise within normal limits. No evidence of bowel wall thickening, distention, or inflammatory changes. Vascular/Lymphatic: Normal caliber abdominal aorta. Bilateral periaortic calcified lymph node as can be seen with prior granulomatous disease. Reproductive: Prostate is unremarkable. Other: No abdominal wall hernia or abnormality. No  abdominopelvic ascites. Musculoskeletal: No aggressive osseous lesion. Left total hip arthroplasty. No acute osseous abnormality. Severe osteoarthritis of the right hip. Review of the MIP images confirms the above findings. IMPRESSION: 1. No pulmonary embolus. 2. Dense left lower lobe pneumonia. 3. Mild hazy right upper lobe airspace disease concerning for pneumonia. 4. 13 mm right upper lobe and right lower lobe pulmonary nodules which may be secondary to an inflammatory or neoplastic etiology. Recommend follow-up CT of the chest in 3 months. Electronically Signed   By: Elige Ko   On: 06/14/2017 12:41   Dg Chest Port 1 View  Result Date: 06/25/2017 CLINICAL DATA:  Lung abscess EXAM: PORTABLE CHEST 1 VIEW COMPARISON:  Yesterday FINDINGS: History of lung abscess with cavity at the left base. Fluid level is not seen today, presumably positional. Stable small left effusion. The right lung is clear other than mild scarring or atelectasis at the base. Interface at the left apex is best attributed to the third rib cortex. No continuous pleural line is seen for pneumothorax. Normal heart size. IMPRESSION: Stable left lower lobe pneumonia, cavity, and small effusion. Electronically Signed   By: Marnee Spring M.D.   On: 06/25/2017 07:51   Dg Knee Complete 4 Views Right  Result Date: 06/07/2017 CLINICAL DATA:  Knee pain EXAM: RIGHT KNEE - COMPLETE 4+ VIEW COMPARISON:  None. FINDINGS: No evidence of fracture, dislocation, or joint effusion. No evidence of arthropathy or other focal bone abnormality. Soft tissues are unremarkable. IMPRESSION: Negative. Electronically Signed   By: Kennith Center M.D.   On: 06/07/2017 22:38   Dg Femur, Min 2 Views Right  Result Date: 06/07/2017 CLINICAL DATA:  Pain. EXAM: RIGHT FEMUR 2 VIEWS COMPARISON:  None. FINDINGS: Marked loss of joint space with subchondral sclerosis and subchondral cyst formation noted in the acetabulum and femoral head. Bones are demineralized. No  evidence for femur fracture. No suspicious lytic or sclerotic osseous abnormality within the femur. 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 Electronically Signed   By: Kennith Center M.D.   On: 06/07/2017 20:35     LOS: 4 days   Jeoffrey Massed, MD  [facility] Pager:[phone]  If 7PM-7AM, please contact night-coverage [email or web address] Password [facility] 06/25/2017, 2:32 PM

## 2017-06-25 NOTE — Plan of Care (Signed)
  Problem: Clinical Measurements: Goal: Ability to maintain a body temperature in the normal range will improve Outcome: Progressing   Problem: Respiratory: Goal: Ability to maintain adequate ventilation will improve Outcome: Progressing Goal: Ability to maintain a clear airway will improve Outcome: Progressing   Problem: Health Behavior/Discharge Planning: Goal: Ability to manage health-related needs will improve Outcome: Progressing   Problem: Clinical Measurements: Goal: Ability to maintain clinical measurements within normal limits will improve Outcome: Progressing Goal: Will remain free from infection Outcome: Progressing Goal: Diagnostic test results will improve Outcome: Progressing Goal: Respiratory complications will improve Outcome: Progressing Goal: Cardiovascular complication will be avoided Outcome: Progressing   Problem: Pain Managment: Goal: General experience of comfort will improve Outcome: Progressing

## 2017-06-26 ENCOUNTER — Inpatient Hospital Stay (HOSPITAL_COMMUNITY): Payer: Medicare Other

## 2017-06-26 DIAGNOSIS — I10 Essential (primary) hypertension: Secondary | ICD-10-CM

## 2017-06-26 LAB — CULTURE, BLOOD (ROUTINE X 2)
CULTURE: NO GROWTH
Culture: NO GROWTH

## 2017-06-26 MED ORDER — GUAIFENESIN-DM 100-10 MG/5ML PO SYRP
5.0000 mL | ORAL_SOLUTION | ORAL | Status: DC | PRN
  Administered 2017-06-26 – 2017-06-29 (×10): 5 mL via ORAL
  Filled 2017-06-26 (×11): qty 10

## 2017-06-27 ENCOUNTER — Inpatient Hospital Stay (HOSPITAL_COMMUNITY): Payer: Medicare Other

## 2017-06-27 DIAGNOSIS — R0602 Shortness of breath: Secondary | ICD-10-CM

## 2017-06-27 DIAGNOSIS — J90 Pleural effusion, not elsewhere classified: Secondary | ICD-10-CM

## 2017-06-27 DIAGNOSIS — K219 Gastro-esophageal reflux disease without esophagitis: Secondary | ICD-10-CM

## 2017-06-27 LAB — BASIC METABOLIC PANEL
ANION GAP: 10 (ref 5–15)
BUN: 11 mg/dL (ref 6–20)
CHLORIDE: 103 mmol/L (ref 101–111)
CO2: 26 mmol/L (ref 22–32)
Calcium: 8.9 mg/dL (ref 8.9–10.3)
Creatinine, Ser: 1.01 mg/dL (ref 0.61–1.24)
GFR calc non Af Amer: >60 mL/min (ref >60)
Glucose, Bld: 93 mg/dL (ref 65–99)
POTASSIUM: 4.2 mmol/L (ref 3.5–5.1)
Sodium: 139 mmol/L (ref 135–145)

## 2017-06-27 LAB — CBC
HCT: 33.5 % — ABNORMAL LOW (ref 39.0–52.0)
HEMOGLOBIN: 10.4 g/dL — AB (ref 13.0–17.0)
MCH: 25.6 pg — ABNORMAL LOW (ref 26.0–34.0)
MCHC: 31 g/dL (ref 30.0–36.0)
MCV: 82.5 fL (ref 78.0–100.0)
Platelets: 675 10*3/uL — ABNORMAL HIGH (ref 150–400)
RBC: 4.06 MIL/uL — AB (ref 4.22–5.81)
RDW: 16.4 % — ABNORMAL HIGH (ref 11.5–15.5)
WBC: 6.3 10*3/uL (ref 4.0–10.5)

## 2017-06-27 NOTE — Progress Notes (Addendum)
PROGRESS NOTE        PATIENT DETAILS Name: [name] Age: 52 y.o. Sex: male Date of Birth: 04-26-1965 Admit Date: 06/21/2017 Admitting Physician Renae Fickle, MD ZOX:[id number], No Pcp Per  Brief Narrative: Patient is a 52 y.o. male prior history of HIV (CD4 count of 280 on 06/07/17)-recent hospitalization from 3/31-4/4 due to sepsis-pneumonia-pneumococcal bacteremia-sent home on oral amoxicillin-presented to the hospital with worsening cough, shortness of breath-found to have a lung abscess-and admitted to the hospitalist service.  See below for further details  Subjective:  Patient sitting up in bed, in no discomfort, denies any headache chest or abdominal pain, cough and shortness of breath are improving.  No fevers or chills.  Assessment/Plan:  Left lung abscess, recent pansensitive Streptococcal Pneumonae pneumonia and bacteremia a few weeks ago: Overall much better clinically and symptomatically is on empiric IV vancomycin and Unasyn, thoracentesis was attempted on 06/23/2017 with minimal fluid extraction hence this was not pursued further.  She has been seen by pulmonary and refuses to see ID here.  Wants to see ID at [facility] where he has an appointment.  Apparently plan is to pursue present plan of care, repeat CT chest on 06/29/2017 if no better 3 weeks of IV antibiotics via PICC line.  If worse will defer to pulmonary for further workup.    Minimally elevated troponin: Trend is flat-not consistent with ACS-recent echocardiogram showed preserved EF.  No further workup required.  One episode of hematochezia: Occurred on 4/9-none since then.  Suspect hemorrhoids.  Refused Anusol.  HIV: He is HIV in good control for the last 20 years and he has been compliant with his medications, wants to change his outpatient ID physician, he will find one himself he said.  Likely at risk for rest.  Asthma: Table-continue with bronchodilators  Iron  deficiency anemia: Iron supplementation-repeat anemia panel in 1 month.  Defer endoscopic evaluation if not done to the outpatient setting.    Lung nodules: Will require outpatient CT chest in 3 months.   GERD: Continue PPI  DVT Prophylaxis: Prophylactic Lovenox   Code Status: Full code   Family Communication: None at bedside  Disposition Plan: Remain inpatient-home sometime next week  Antimicrobial agents: Anti-infectives (From admission, onward)   Start     Dose/Rate Route Frequency Ordered Stop   06/21/17 1700  Darunavir-Cobicisctat-Emtricitabine-Tenofovir Alafenamide (SYMTUZA) 800-150-200-10 MG TABS 1 tablet     1 tablet Oral Daily with breakfast 06/21/17 1347     06/21/17 1700  dolutegravir (TIVICAY) tablet 50 mg     50 mg Oral Daily 06/21/17 1347     06/21/17 1700  Ampicillin-Sulbactam (UNASYN) 3 g in sodium chloride 0.9 % 100 mL IVPB     3 g 200 mL/hr over 30 Minutes Intravenous Every 6 hours 06/21/17 1417     06/21/17 1700  vancomycin (VANCOCIN) 1,250 mg in sodium chloride 0.9 % 250 mL IVPB     1,250 mg 166.7 mL/hr over 90 Minutes Intravenous Every 12 hours 06/21/17 1417     06/21/17 1600  fluconazole (DIFLUCAN) tablet 200 mg     200 mg Oral Daily 06/21/17 1347     06/21/17 1600  valACYclovir (VALTREX) tablet 500 mg     500 mg Oral Daily 06/21/17 1347     06/21/17 1015  vancomycin (VANCOCIN) IVPB 1000 mg/200 mL premix  1,000 mg 200 mL/hr over 60 Minutes Intravenous  Once 06/21/17 1004 06/21/17 1233   06/21/17 1015  piperacillin-tazobactam (ZOSYN) IVPB 3.375 g     3.375 g 100 mL/hr over 30 Minutes Intravenous  Once 06/21/17 1004 06/21/17 1233      Procedures: None  CONSULTS:  pulmonary/intensive care  Time spent: 25- minutes-Greater than 50% of this time was spent in counseling, explanation of diagnosis, planning of further management, and coordination of care.  MEDICATIONS: Scheduled Meds: . aspirin EC  325 mg Oral Daily  .  Darunavir-Cobicisctat-Emtricitabine-Tenofovir Alafenamide  1 tablet Oral Q breakfast  . dextromethorphan-guaiFENesin  1 tablet Oral BID  . dolutegravir  50 mg Oral Daily  . enoxaparin (LOVENOX) injection  40 mg Subcutaneous Q24H  . ferrous sulfate  325 mg Oral Q breakfast  . fluconazole  200 mg Oral Daily  . gabapentin  100 mg Oral TID  . ipratropium-albuterol  3 mL Nebulization BID  . lactose free nutrition  237 mL Oral TID WC  . magnesium oxide  400 mg Oral BID  . valACYclovir  500 mg Oral Daily   Continuous Infusions: . ampicillin-sulbactam (UNASYN) IV Stopped (06/27/17 1019)  . vancomycin Stopped (06/27/17 1018)   PRN Meds:.acetaminophen **OR** acetaminophen, albuterol, benzonatate, bisacodyl, chlorpheniramine-HYDROcodone, guaiFENesin-dextromethorphan, ibuprofen, nitroGLYCERIN, ondansetron **OR** ondansetron (ZOFRAN) IV, polyethylene glycol, zolpidem   PHYSICAL EXAM: Vital signs: Vitals:   06/26/17 1305 06/26/17 2039 06/27/17 0449 06/27/17 1026  BP: 122/75 108/60 103/67   Pulse: 72 71 61   Resp: 14 (!) 22 18   Temp: 98.8 F (37.1 C) 97.8 F (36.6 C) 97.7 F (36.5 C)   TempSrc: Oral Oral Oral   SpO2: 97% 97% 96% 96%  Weight:      Height:       Filed Weights   06/21/17 0626 06/21/17 1532  Weight: 88 kg (194 lb) 83.9 kg (185 lb)   Body mass index is 23.75 kg/m.   Exam   Awake Alert, Oriented X 3, No new F.N deficits, Normal affect [place].AT,PERRAL Supple Neck,No JVD, No cervical lymphadenopathy appriciated.  Symmetrical Chest wall movement, Good air movement bilaterally, few coarse breath sounds RRR,No Gallops, Rubs or new Murmurs, No Parasternal Heave +ve B.Sounds, Abd Soft, No tenderness, No organomegaly appriciated, No rebound - guarding or rigidity. No Cyanosis, Clubbing or edema, No new Rash or bruise   I have personally reviewed following labs and imaging studies  LABORATORY DATA: CBC: Recent Labs  Lab 06/22/17 0222 06/23/17 0454 06/24/17 0447  06/25/17 0443 06/27/17 0438  WBC 23.5* 10.7* 6.1 7.1 6.3  HGB 10.0* 8.1* 8.6* 9.1* 10.4*  HCT 31.2* 24.9* 26.8* 28.8* 33.5*  MCV 81.7 79.3 79.5 81.1 82.5  PLT 427* 450* 510* 605* 675*    Basic Metabolic Panel: Recent Labs  Lab 06/22/17 0222 06/23/17 0454 06/24/17 0447 06/25/17 0443 06/27/17 0438  NA 137 137 140 138 139  K 3.5 3.2* 3.9 4.2 4.2  CL 112* 110 112* 109 103  CO2 15* 17* 19* 20* 26  GLUCOSE 93 102* 100* 91 93  BUN 12 10 13 9 11   CREATININE 0.90 0.91 0.89 0.90 1.01  CALCIUM 8.3* 8.2* 8.4* 8.5* 8.9    GFR: Estimated Creatinine Clearance: 101.7 mL/min (by C-G formula based on SCr of 1.01 mg/dL).  Liver Function Tests: Recent Labs  Lab 06/21/17 0748  AST 20  ALT 27  ALKPHOS 120  BILITOT 0.6  PROT 9.3*  ALBUMIN 3.0*   No results for input(s): LIPASE, AMYLASE in the last  168 hours. No results for input(s): AMMONIA in the last 168 hours.  Coagulation Profile: No results for input(s): INR, PROTIME in the last 168 hours.  Cardiac Enzymes: Recent Labs  Lab 06/21/17 0748 06/21/17 1538 06/21/17 1915 06/22/17 0222  TROPONINI 0.14* <0.03 <0.03 0.03*    BNP (last 3 results) No results for input(s): PROBNP in the last 8760 hours.  HbA1C: No results for input(s): HGBA1C in the last 72 hours.  CBG: No results for input(s): GLUCAP in the last 168 hours.  Lipid Profile: No results for input(s): CHOL, HDL, LDLCALC, TRIG, CHOLHDL, LDLDIRECT in the last 72 hours.  Thyroid Function Tests: No results for input(s): TSH, T4TOTAL, FREET4, T3FREE, THYROIDAB in the last 72 hours.  Anemia Panel: No results for input(s): VITAMINB12, FOLATE, FERRITIN, TIBC, IRON, RETICCTPCT in the last 72 hours.  Urine analysis: No results found for: COLORURINE, APPEARANCEUR, LABSPEC, PHURINE, GLUCOSEU, HGBUR, BILIRUBINUR, KETONESUR, PROTEINUR, UROBILINOGEN, NITRITE, LEUKOCYTESUR  Sepsis Labs: Lactic Acid, Venous    Component Value Date/Time   LATICACIDVEN 0.85 06/14/2017  1301    MICROBIOLOGY: Recent Results (from the past 240 hour(s))  Blood culture (routine x 2)     Status: None   Collection Time: 06/21/17  7:49 AM  Result Value Ref Range Status   Specimen Description   Final    BLOOD RIGHT ARM Performed at [facility], [address] [place], [place] [address]    Special Requests   Final    BOTTLES DRAWN AEROBIC AND ANAEROBIC Blood Culture results may not be optimal due to an excessive volume of blood received in culture bottles Performed at [facility], [address] [place], [place] [address]    Culture   Final    NO GROWTH 5 DAYS Performed at [facility], [address] [place], [place] [address]    Report Status 06/26/2017 FINAL  Final  Blood culture (routine x 2)     Status: None   Collection Time: 06/21/17  9:31 AM  Result Value Ref Range Status   Specimen Description   Final    BLOOD RIGHT ANTECUBITAL Performed at [facility], [address] [place], [place] [address]    Special Requests   Final    BOTTLES DRAWN AEROBIC AND ANAEROBIC Blood Culture results may not be optimal due to an excessive volume of blood received in culture bottles Performed at [facility], [address] [place], [place] [address]    Culture   Final    NO GROWTH 5 DAYS Performed at [facility], [address] [place], [place] [address]    Report Status 06/26/2017 FINAL  Final  MRSA PCR Screening     Status: None   Collection Time: 06/22/17  3:46 AM  Result Value Ref Range Status   MRSA by PCR NEGATIVE NEGATIVE Final    Comment:        The GeneXpert MRSA Assay (FDA approved for NASAL specimens only), is one component of a comprehensive MRSA colonization surveillance program. It is not intended to diagnose MRSA infection nor to guide or monitor treatment for MRSA infections. Performed at [facility], [address] [place], [place] [address]     RADIOLOGY  STUDIES/RESULTS: Dg Chest 2 View  Result Date: 06/24/2017 CLINICAL DATA:  Productive cough, shortness of breath, HIV, previous episode of pneumonia. EXAM: CHEST - 2 VIEW COMPARISON:  Chest x-ray of April 7th 2019 and chest CT scan of  the same day FINDINGS: The right lung is clear. There is an air-fluid level in a cavitary mass at the right lung base. This is better organized than on the previous study. The heart and pulmonary vascularity are normal. The mediastinum is normal in width. There is no pneumothorax. The bony thorax is unremarkable. IMPRESSION: Right lower lobe lung abscess with an air-fluid level. This is slightly better organized today. Electronically Signed   By: David  [place] M.D.   On: 06/24/2017 11:13   Dg Chest 2 View  Result Date: 06/21/2017 CLINICAL DATA:  Pneumonia.  Shortness of breath. EXAM: CHEST - 2 VIEW COMPARISON:  June 16, 2017 FINDINGS: The infiltrate in the left lower lobe appears mildly worsened in the interval. Mild opacity in the medial right base could represent subtle infiltrate versus vascular crowding. No pneumothorax. The heart, hila, and mediastinum are normal. Air-fluid levels are seen behind the heart on the lateral view. No hiatal hernia was seen on the CT scan from June 14, 2017. IMPRESSION: 1. The infiltrate in the left base appears mildly worsened in the interval. Vascular crowding versus subtle infiltrate in the medial right base. 2. An air-fluid level is projected behind the heart on the lateral view is identified. No hiatal hernia was identified on the CT scan from June 14, 2017 an air-fluid level could be associated with the infiltrate. Recommend a CT scan for better evaluation. Electronically Signed   By: Gerome Sam III M.D   On: 06/21/2017 07:56   Dg Chest 2 View  Result Date: 06/16/2017 CLINICAL DATA:  Chest pain, shortness of breath, history of HIV EXAM: CHEST - 2 VIEW COMPARISON:  CT chest of 06/14/2017 and chest x-ray of the same day FINDINGS:  There is little change in dense consolidation of the left lower lobe posteriorly consistent with left lower lobe pneumonia. Otherwise the lungs are clear. Mediastinal and hilar contours are stable. The heart is within normal limits in size. No acute bony abnormality is seen. IMPRESSION: No change in dense consolidation of the left lower lobe consistent with pneumonia. Electronically Signed   By: Dwyane Dee M.D.   On: 06/16/2017 08:10   Dg Chest 2 View  Result Date: 06/14/2017 CLINICAL DATA:  Cough, chest pain and shortness of breath for 4 days. EXAM: CHEST - 2 VIEW COMPARISON:  09/19/2009 and prior chest radiographs FINDINGS: Airspace disease/consolidation involving much of the LEFT LOWER lobe identified likely representing pneumonia. Mild ill-defined opacities within the mid and lower RIGHT lung may represent developing airspace disease/pneumonia. Cardiomediastinal silhouette is otherwise unchanged. There may be a trace LEFT pleural effusion present. There is no evidence of pneumothorax or acute bony abnormality. IMPRESSION: Airspace disease/consolidation involving the majority of the LEFT LOWER lobe likely representing pneumonia. Mild opacities within the mid and lower RIGHT lung which may represent developing airspace disease/pneumonia. Radiographic follow-up to resolution is recommended. Electronically Signed   By: Harmon Pier M.D.   On: 06/14/2017 07:42   Ct Chest W Contrast  Result Date: 06/21/2017 CLINICAL DATA:  Pneumonia.  HIV positive. EXAM: CT CHEST WITH CONTRAST TECHNIQUE: Multidetector CT imaging of the chest was performed during intravenous contrast administration. CONTRAST:  75mL OMNIPAQUE IOHEXOL 300 MG/ML  SOLN COMPARISON:  CT scan June 14, 2017 FINDINGS: Cardiovascular: The thoracic aorta is nonaneurysmal with no dissection or atherosclerotic change. The heart is unchanged. The central pulmonary artery measures 3.8 cm. No obvious filling defects. Mediastinum/Nodes: Thyroid and esophagus  are normal. Shotty nodes in the mediastinum without adenopathy are  likely reactive. Lungs/Pleura: Central airways are normal. The patient's known left lower lobe pneumonia is again identified. There is a rounded consolidated component as seen on series 7, image 100 measuring 9.5 by 7.8 by 10.3 cm containing an air-fluid level consistent with necrotic lung/abscess. This accounts for the recent chest x-ray finding. Mild patchy ground-glass opacity in the right upper lobe is likely infectious, less focal in the interval. There is a left-sided pleural effusion which is small, extending into the left fissure. Upper Abdomen: No acute abnormality. Musculoskeletal: No chest wall abnormality. No acute or significant osseous findings. IMPRESSION: 1. Left lower lobe pneumonia. There is now a large rounded collection within the infiltrate containing air-fluid levels consistent with necrosis/abscess formation. 2. Mild patchy infectious change in the right upper lobe. 3. Reactive nodes in the mediastinum. 4. The main pulmonary artery measures 3.8 cm which is mildly dilated raising the possibility of pulmonary hypertension. 5. No other abnormalities. Electronically Signed   By: Gerome Sam III M.D   On: 06/21/2017 11:03   Ct Angio Chest Pe W And/or Wo Contrast  Result Date: 06/14/2017 CLINICAL DATA:  Acute generalized abdominal pain EXAM: CT ANGIOGRAPHY CHEST CT ABDOMEN AND PELVIS WITH CONTRAST TECHNIQUE: Multidetector CT imaging of the chest was performed using the standard protocol during bolus administration of intravenous contrast. Multiplanar CT image reconstructions and MIPs were obtained to evaluate the vascular anatomy. Multidetector CT imaging of the abdomen and pelvis was performed using the standard protocol during bolus administration of intravenous contrast. CONTRAST:  ISOVUE-370 IOPAMIDOL (ISOVUE-370) INJECTION 76% COMPARISON:  None. FINDINGS: CTA CHEST FINDINGS Cardiovascular: Satisfactory opacification of  the pulmonary arteries to the segmental level. No evidence of pulmonary embolism. Normal heart size. No pericardial effusion. Mediastinum/Nodes: Enlarged subcarinal lymph node measuring 13 mm. Mild left hilar lymphadenopathy likely reactive. No axillary lymphadenopathy. Normal trachea and esophagus. Normal thyroid gland. Lungs/Pleura: Dense left lower lobe consolidation. 13 mm right lower lobe pulmonary nodule. 13 mm right upper lobe pulmonary nodule. Hazy airspace disease in the right upper lobe. Small left pleural effusion. No pneumothorax. Musculoskeletal: No acute osseous abnormality. No aggressive osseous lesion. Review of the MIP images confirms the above findings. CT ABDOMEN and PELVIS FINDINGS Hepatobiliary: No focal liver abnormality is seen. No gallstones, gallbladder wall thickening, or biliary dilatation. Pancreas: Unremarkable. No pancreatic ductal dilatation or surrounding inflammatory changes. Spleen: Multiple calcifications in the spleen as can be seen with prior granulomatous disease. Adrenals/Urinary Tract: Adrenal glands are unremarkable. No urolithiasis or obstructive uropathy. 18 mm hypodense, fluid attenuating right interpolar renal mass most consistent with a cyst. Bladder is unremarkable. Stomach/Bowel: Small hiatal hernia. Stomach is otherwise within normal limits. No evidence of bowel wall thickening, distention, or inflammatory changes. Vascular/Lymphatic: Normal caliber abdominal aorta. Bilateral periaortic calcified lymph node as can be seen with prior granulomatous disease. Reproductive: Prostate is unremarkable. Other: No abdominal wall hernia or abnormality. No abdominopelvic ascites. Musculoskeletal: No aggressive osseous lesion. Left total hip arthroplasty. No acute osseous abnormality. Severe osteoarthritis of the right hip. Review of the MIP images confirms the above findings. IMPRESSION: 1. No pulmonary embolus. 2. Dense left lower lobe pneumonia. 3. Mild hazy right upper lobe  airspace disease concerning for pneumonia. 4. 13 mm right upper lobe and right lower lobe pulmonary nodules which may be secondary to an inflammatory or neoplastic etiology. Recommend follow-up CT of the chest in 3 months. Electronically Signed   By: Elige Ko   On: 06/14/2017 12:41   Ct Pelvis Wo Contrast  Result Date:  06/07/2017 CLINICAL DATA:  52 year old male with right hip pain. EXAM: CT PELVIS WITHOUT CONTRAST TECHNIQUE: Multidetector CT imaging of the pelvis was performed following the standard protocol without intravenous contrast. COMPARISON:  Pelvic radiograph dated 04/13/2017 and CT of the abdomen pelvis dated 10/21/2013. FINDINGS: Urinary Tract: The visualized distal ureters and urinary bladder appear unremarkable. Bowel: No bowel dilatation or active inflammation within the pelvis. Vascular/Lymphatic: The visualized vasculature appear unremarkable. A top-normal left external iliac chain lymph node measures 1 cm in short axis, of indeterminate clinical significance, likely reactive. Reproductive: The prostate and seminal vesicles are grossly unremarkable. Other:  None Musculoskeletal: There is a total left arthroplasty. The arthroplasty components appear intact and in anatomic alignment. There is heterotopic bone formation adjacent to the left greater trochanter. The appearance of the arthroplasty and acetabular screws are similar to the prior CT. There is osteopenia of the left hip primarily adjacent to the acetabular cup, increased since the prior CT. There is severe osteoarthritis of the right hip with slight flattening of the right femoral head and narrowing of the joint space similar or slightly progressed compared to the CT of 2015. There is no acute fracture. No dislocation. IMPRESSION: Severe osteoarthritic changes of the right hip similar or slightly progressed compared to the prior CT. Mild flattening and fragmentation of the right femoral head may be sequela of underlying avascular  necrosis. Left hip arthroplasty appears intact. Progression of osteopenia surrounding the acetabular cup of the left hip. No acute fracture. Electronically Signed   By: Elgie Collard M.D.   On: 06/07/2017 22:29   [place] Chest (pleural Effusion)  Result Date: 06/23/2017 CLINICAL DATA:  52 year old male who presents for left-sided thoracentesis. EXAM: CHEST ULTRASOUND COMPARISON:  CT scan of the chest 06/21/2017 FINDINGS: Sonographic interrogation of the left pleural space demonstrates trace pleural fluid. There is insufficient fluid for safe thoracentesis. The procedure was deferred. IMPRESSION: Trace left-sided pleural effusion, insufficient for thoracentesis. Electronically Signed   By: Malachy Moan M.D.   On: 06/23/2017 17:13   Ct Abdomen Pelvis W Contrast  Result Date: 06/14/2017 CLINICAL DATA:  Acute generalized abdominal pain EXAM: CT ANGIOGRAPHY CHEST CT ABDOMEN AND PELVIS WITH CONTRAST TECHNIQUE: Multidetector CT imaging of the chest was performed using the standard protocol during bolus administration of intravenous contrast. Multiplanar CT image reconstructions and MIPs were obtained to evaluate the vascular anatomy. Multidetector CT imaging of the abdomen and pelvis was performed using the standard protocol during bolus administration of intravenous contrast. CONTRAST:  ISOVUE-370 IOPAMIDOL (ISOVUE-370) INJECTION 76% COMPARISON:  None. FINDINGS: CTA CHEST FINDINGS Cardiovascular: Satisfactory opacification of the pulmonary arteries to the segmental level. No evidence of pulmonary embolism. Normal heart size. No pericardial effusion. Mediastinum/Nodes: Enlarged subcarinal lymph node measuring 13 mm. Mild left hilar lymphadenopathy likely reactive. No axillary lymphadenopathy. Normal trachea and esophagus. Normal thyroid gland. Lungs/Pleura: Dense left lower lobe consolidation. 13 mm right lower lobe pulmonary nodule. 13 mm right upper lobe pulmonary nodule. Hazy airspace disease in the right  upper lobe. Small left pleural effusion. No pneumothorax. Musculoskeletal: No acute osseous abnormality. No aggressive osseous lesion. Review of the MIP images confirms the above findings. CT ABDOMEN and PELVIS FINDINGS Hepatobiliary: No focal liver abnormality is seen. No gallstones, gallbladder wall thickening, or biliary dilatation. Pancreas: Unremarkable. No pancreatic ductal dilatation or surrounding inflammatory changes. Spleen: Multiple calcifications in the spleen as can be seen with prior granulomatous disease. Adrenals/Urinary Tract: Adrenal glands are unremarkable. No urolithiasis or obstructive uropathy. 18 mm hypodense, fluid attenuating right  interpolar renal mass most consistent with a cyst. Bladder is unremarkable. Stomach/Bowel: Small hiatal hernia. Stomach is otherwise within normal limits. No evidence of bowel wall thickening, distention, or inflammatory changes. Vascular/Lymphatic: Normal caliber abdominal aorta. Bilateral periaortic calcified lymph node as can be seen with prior granulomatous disease. Reproductive: Prostate is unremarkable. Other: No abdominal wall hernia or abnormality. No abdominopelvic ascites. Musculoskeletal: No aggressive osseous lesion. Left total hip arthroplasty. No acute osseous abnormality. Severe osteoarthritis of the right hip. Review of the MIP images confirms the above findings. IMPRESSION: 1. No pulmonary embolus. 2. Dense left lower lobe pneumonia. 3. Mild hazy right upper lobe airspace disease concerning for pneumonia. 4. 13 mm right upper lobe and right lower lobe pulmonary nodules which may be secondary to an inflammatory or neoplastic etiology. Recommend follow-up CT of the chest in 3 months. Electronically Signed   By: Elige Ko   On: 06/14/2017 12:41   Dg Chest Port 1 View  Result Date: 06/27/2017 CLINICAL DATA:  History of lung abscess. EXAM: PORTABLE CHEST 1 VIEW COMPARISON:  Chest x-rays dated 06/25/2017 and 06/24/2017. Chest CT dated 06/21/2017.  FINDINGS: Lucent abscess cavity is seen over the LEFT heart border, corresponding to the abscess collection demonstrated on CT of 06/21/2017, but without convincing air-fluid level as seen on the recent chest x-rays and earlier chest CT. Small adjacent LEFT pleural effusion is stable. RIGHT lung remains clear. Heart size and mediastinal contours are stable. IMPRESSION: 1. Lucent abscess cavity within the LEFT lower lung. Associated air-fluid levels within the abscess cavity were seen on previous exams. No air-fluid level appreciated within the cavity on today's exam, possibly positional in nature as indicated on yesterday's chest x-ray report, possibly indicating interval improvement. 2. Small adjacent LEFT pleural effusion appears stable. 3. No new lung findings. Electronically Signed   By: Bary Richard M.D.   On: 06/27/2017 07:13   Dg Chest Port 1 View  Result Date: 06/25/2017 CLINICAL DATA:  Lung abscess EXAM: PORTABLE CHEST 1 VIEW COMPARISON:  Yesterday FINDINGS: History of lung abscess with cavity at the left base. Fluid level is not seen today, presumably positional. Stable small left effusion. The right lung is clear other than mild scarring or atelectasis at the base. Interface at the left apex is best attributed to the third rib cortex. No continuous pleural line is seen for pneumothorax. Normal heart size. IMPRESSION: Stable left lower lobe pneumonia, cavity, and small effusion. Electronically Signed   By: Marnee Spring M.D.   On: 06/25/2017 07:51   Dg Knee Complete 4 Views Right  Result Date: 06/07/2017 CLINICAL DATA:  Knee pain EXAM: RIGHT KNEE - COMPLETE 4+ VIEW COMPARISON:  None. FINDINGS: No evidence of fracture, dislocation, or joint effusion. No evidence of arthropathy or other focal bone abnormality. Soft tissues are unremarkable. IMPRESSION: Negative. Electronically Signed   By: Kennith Center M.D.   On: 06/07/2017 22:38   Dg Femur, Min 2 Views Right  Result Date: 06/07/2017 CLINICAL  DATA:  Pain. EXAM: RIGHT FEMUR 2 VIEWS COMPARISON:  None. FINDINGS: Marked loss of joint space with subchondral sclerosis and subchondral cyst formation noted in the acetabulum and femoral head. Bones are demineralized. No evidence for femur fracture. No suspicious lytic or sclerotic osseous abnormality within the femur. 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 Electronically Signed   By: Kennith Center M.D.   On: 06/07/2017 20:35     LOS: 6 days   Signature  Susa Raring M.D on 06/27/2017 at  1:15 PM  Between 7am to 7pm - Pager - [phone] ( page via [email or web address], text pages only, please mention full 10 digit call back number).  After 7pm go to [email or web address] - password [facility]

## 2017-06-27 NOTE — Progress Notes (Signed)
Pharmacy Antibiotic Note  [name] is a 52 y.o. male with hx HIV and recently hospitalized for strep pneumococcal bacteremia/PNA.  He was discharged on 06/18/17 with 12 day course of augmentin. He presented back to the ED on 06/21/2017 with c/o SOB.  To start vancomycin and unasyn for PNA/lung abscess. 06/27/2017:  D#7 abx  CXR- stable, possible interval improvement.   Afebrile  Scr stable  Plan:  Continue unasyn 3gm IV q6h  Continue vancomycin 1250 mg IV q12h   Monitor renal function and cx data   Noted plan for 3 weeks IV antibiotics.  Repeat weekly Vanc levels for sooner if clinically warranted.  ___________________________________  Height: 6\' 2"  (188 cm) Weight: 185 lb (83.9 kg) IBW/kg (Calculated) : 82.2  Temp (24hrs), Avg:98.1 F (36.7 C), Min:97.7 F (36.5 C), Max:98.8 F (37.1 C)  Recent Labs  Lab 06/22/17 0222 06/23/17 0454 06/24/17 0447 06/24/17 1719 06/24/17 2031 06/25/17 0443 06/27/17 0438  WBC 23.5* 10.7* 6.1  --   --  7.1 6.3  CREATININE 0.90 0.91 0.89  --   --  0.90 1.01  VANCOTROUGH  --   --   --  14*  --   --   --   VANCOPEAK  --   --   --   --  22*  --   --     Estimated Creatinine Clearance: 101.7 mL/min (by C-G formula based on SCr of 1.01 mg/dL).    Allergies  Allergen Reactions  . Sulfa Antibiotics Rash  . Tramadol Nausea Only   Antimicrobials this admission:  4/7 zosyn x1 4/7 vanc>> 4/7 unasyn>>  Dose adjustments this admission:  4/10 1719 = 14 mcg/mL (vanco 1250mg  IV q12h - dose at 17:54) 4/10 2030 pk = 22 mcg/mL - calculates a Ke = 0.0514, Pk = 23.3, tr = 13.5 and AUC = 433.7  Microbiology results:  3/31 bcx x2: 2/2 strep pneum (pans sens) 4/2 MRSA PCR: neg 4/7 flu neg 4/8 MRSA PCR neg 4/7 BCx x2: ngtd  Thank you for allowing pharmacy to be a part of this patient's care.  Elson ClanLilliston, Kenadee Gates Michelle 06/27/2017 9:18 AM

## 2017-06-27 NOTE — Plan of Care (Signed)
  Problem: Respiratory: Goal: Ability to maintain adequate ventilation will improve Outcome: Progressing Goal: Ability to maintain a clear airway will improve Outcome: Progressing   Problem: Clinical Measurements: Goal: Ability to maintain clinical measurements within normal limits will improve Outcome: Progressing Goal: Will remain free from infection Outcome: Progressing Goal: Diagnostic test results will improve Outcome: Progressing Goal: Respiratory complications will improve Outcome: Progressing Goal: Cardiovascular complication will be avoided Outcome: Progressing   Problem: Pain Managment: Goal: General experience of comfort will improve Outcome: Progressing

## 2017-06-28 LAB — EXPECTORATED SPUTUM ASSESSMENT W REFEX TO RESP CULTURE

## 2017-06-28 NOTE — Plan of Care (Signed)
  Problem: Clinical Measurements: Goal: Will remain free from infection Outcome: Progressing   Problem: Clinical Measurements: Goal: Ability to maintain a body temperature in the normal range will improve Outcome: Adequate for Discharge   Problem: Respiratory: Goal: Ability to maintain adequate ventilation will improve Outcome: Adequate for Discharge Goal: Ability to maintain a clear airway will improve Outcome: Adequate for Discharge   Problem: Clinical Measurements: Goal: Ability to maintain clinical measurements within normal limits will improve Outcome: Adequate for Discharge Goal: Diagnostic test results will improve Outcome: Adequate for Discharge Goal: Respiratory complications will improve Outcome: Adequate for Discharge Goal: Cardiovascular complication will be avoided Outcome: Adequate for Discharge   Problem: Pain Managment: Goal: General experience of comfort will improve Outcome: Adequate for Discharge

## 2017-06-28 NOTE — Progress Notes (Signed)
PROGRESS NOTE        PATIENT DETAILS Name: [name] Age: 52 y.o. Sex: male Date of Birth: [birth year] Admit Date: 06/21/2017 Admitting Physician Renae Fickle, MD ZOX:[id number], No Pcp Per  Brief Narrative: Patient is a 52 y.o. male prior history of HIV (CD4 count of 280 on 06/07/17)-recent hospitalization from 3/31-4/4 due to sepsis-pneumonia-pneumococcal bacteremia-sent home on oral amoxicillin-presented to the hospital with worsening cough, shortness of breath-found to have a lung abscess-and admitted to the hospitalist service.  See below for further details  Subjective:  Patient in bed, appears comfortable, denies any headache, no fever, no chest pain or pressure, no shortness of breath , no abdominal pain. No focal weakness..  Assessment/Plan:  Left lung abscess, recent pansensitive Streptococcal Pneumonae pneumonia and bacteremia a few weeks ago: Overall much better clinically and symptomatically is on empiric IV Vancomycin and Unasyn, thoracentesis was attempted on 06/23/2017 with minimal fluid extraction hence this was not pursued further.  He has been seen by pulmonary here, refused to see ID at our facility but will see one at [facility] post discharge.  Plan is to repeat CT chest tomorrow thereafter if CT chest has improved likely 3 weeks of IV antibiotics via PICC line, if CT chest is worse will defer further management to pulmonary.    Minimally elevated troponin: Trend is flat-not consistent with ACS-recent echocardiogram showed preserved EF.  No further workup required.  One episode of hematochezia: Occurred on 4/9-none since then.  Suspect hemorrhoids.  Refused Anusol.  HIV: He is HIV in good control for the last 20 years and he has been compliant with his medications, wants to change his outpatient ID physician, he will find one himself he said.  Likely at risk for rest.  Asthma: Stable continue as needed bronchodilators and oxygen as  needed.  Iron deficiency anemia: Iron supplementation-repeat anemia panel in 1 month.  Defer endoscopic evaluation if not done to the outpatient setting.    Lung nodules: Chest CT in 3 months with outpatient pulmonary follow-up.  GERD: On PPI continue    DVT Prophylaxis: Prophylactic Lovenox   Code Status: Full code   Family Communication: None at bedside  Disposition Plan: Remain inpatient-home sometime next week  Antimicrobial agents: Anti-infectives (From admission, onward)   Start     Dose/Rate Route Frequency Ordered Stop   06/21/17 1700  Darunavir-Cobicisctat-Emtricitabine-Tenofovir Alafenamide (SYMTUZA) 800-150-200-10 MG TABS 1 tablet     1 tablet Oral Daily with breakfast 06/21/17 1347     06/21/17 1700  dolutegravir (TIVICAY) tablet 50 mg     50 mg Oral Daily 06/21/17 1347     06/21/17 1700  Ampicillin-Sulbactam (UNASYN) 3 g in sodium chloride 0.9 % 100 mL IVPB     3 g 200 mL/hr over 30 Minutes Intravenous Every 6 hours 06/21/17 1417     06/21/17 1700  vancomycin (VANCOCIN) 1,250 mg in sodium chloride 0.9 % 250 mL IVPB     1,250 mg 166.7 mL/hr over 90 Minutes Intravenous Every 12 hours 06/21/17 1417     06/21/17 1600  fluconazole (DIFLUCAN) tablet 200 mg     200 mg Oral Daily 06/21/17 1347     06/21/17 1600  valACYclovir (VALTREX) tablet 500 mg     500 mg Oral Daily 06/21/17 1347     06/21/17 1015  vancomycin (VANCOCIN) IVPB 1000 mg/200  mL premix     1,000 mg 200 mL/hr over 60 Minutes Intravenous  Once 06/21/17 1004 06/21/17 1233   06/21/17 1015  piperacillin-tazobactam (ZOSYN) IVPB 3.375 g     3.375 g 100 mL/hr over 30 Minutes Intravenous  Once 06/21/17 1004 06/21/17 1233      Procedures: None  CONSULTS:  pulmonary/intensive care  Time spent: 25- minutes-Greater than 50% of this time was spent in counseling, explanation of diagnosis, planning of further management, and coordination of care.  MEDICATIONS: Scheduled Meds: . aspirin EC  325 mg Oral  Daily  . Darunavir-Cobicisctat-Emtricitabine-Tenofovir Alafenamide  1 tablet Oral Q breakfast  . dextromethorphan-guaiFENesin  1 tablet Oral BID  . dolutegravir  50 mg Oral Daily  . enoxaparin (LOVENOX) injection  40 mg Subcutaneous Q24H  . ferrous sulfate  325 mg Oral Q breakfast  . fluconazole  200 mg Oral Daily  . gabapentin  100 mg Oral TID  . lactose free nutrition  237 mL Oral TID WC  . magnesium oxide  400 mg Oral BID  . valACYclovir  500 mg Oral Daily   Continuous Infusions: . ampicillin-sulbactam (UNASYN) IV Stopped (06/28/17 1045)  . vancomycin Stopped (06/28/17 0838)   PRN Meds:.acetaminophen **OR** acetaminophen, albuterol, benzonatate, bisacodyl, chlorpheniramine-HYDROcodone, guaiFENesin-dextromethorphan, ibuprofen, nitroGLYCERIN, ondansetron **OR** ondansetron (ZOFRAN) IV, polyethylene glycol, zolpidem   PHYSICAL EXAM: Vital signs: Vitals:   06/27/17 1346 06/27/17 2108 06/28/17 0629 06/28/17 1315  BP: 120/71 118/67 115/69 126/82  Pulse: 76 73 71 72  Resp: 16 18  18   Temp: (!) 97.5 F (36.4 C) 98.3 F (36.8 C) 98.2 F (36.8 C) 97.9 F (36.6 C)  TempSrc: Oral Oral Oral Oral  SpO2: 97% 97% 99% 96%  Weight:      Height:       Filed Weights   06/21/17 0626 06/21/17 1532  Weight: 88 kg (194 lb) 83.9 kg (185 lb)   Body mass index is 23.75 kg/m.   Exam   Awake Alert, Oriented X 3, No new F.N deficits, Normal affect [place].AT,PERRAL Supple Neck,No JVD, No cervical lymphadenopathy appriciated.  Symmetrical Chest wall movement, Good air movement bilaterally, Coarse B sounds RRR,No Gallops, Rubs or new Murmurs, No Parasternal Heave +ve B.Sounds, Abd Soft, No tenderness, No organomegaly appriciated, No rebound - guarding or rigidity. No Cyanosis, Clubbing or edema, No new Rash or bruise   I have personally reviewed following labs and imaging studies  LABORATORY DATA: CBC: Recent Labs  Lab 06/22/17 0222 06/23/17 0454 06/24/17 0447 06/25/17 0443  06/27/17 0438  WBC 23.5* 10.7* 6.1 7.1 6.3  HGB 10.0* 8.1* 8.6* 9.1* 10.4*  HCT 31.2* 24.9* 26.8* 28.8* 33.5*  MCV 81.7 79.3 79.5 81.1 82.5  PLT 427* 450* 510* 605* 675*    Basic Metabolic Panel: Recent Labs  Lab 06/22/17 0222 06/23/17 0454 06/24/17 0447 06/25/17 0443 06/27/17 0438  NA 137 137 140 138 139  K 3.5 3.2* 3.9 4.2 4.2  CL 112* 110 112* 109 103  CO2 15* 17* 19* 20* 26  GLUCOSE 93 102* 100* 91 93  BUN 12 10 13 9 11   CREATININE 0.90 0.91 0.89 0.90 1.01  CALCIUM 8.3* 8.2* 8.4* 8.5* 8.9    GFR: Estimated Creatinine Clearance: 101.7 mL/min (by C-G formula based on SCr of 1.01 mg/dL).  Liver Function Tests: No results for input(s): AST, ALT, ALKPHOS, BILITOT, PROT, ALBUMIN in the last 168 hours. No results for input(s): LIPASE, AMYLASE in the last 168 hours. No results for input(s): AMMONIA in the last 168  hours.  Coagulation Profile: No results for input(s): INR, PROTIME in the last 168 hours.  Cardiac Enzymes: Recent Labs  Lab 06/21/17 1538 06/21/17 1915 06/22/17 0222  TROPONINI <0.03 <0.03 0.03*    BNP (last 3 results) No results for input(s): PROBNP in the last 8760 hours.  HbA1C: No results for input(s): HGBA1C in the last 72 hours.  CBG: No results for input(s): GLUCAP in the last 168 hours.  Lipid Profile: No results for input(s): CHOL, HDL, LDLCALC, TRIG, CHOLHDL, LDLDIRECT in the last 72 hours.  Thyroid Function Tests: No results for input(s): TSH, T4TOTAL, FREET4, T3FREE, THYROIDAB in the last 72 hours.  Anemia Panel: No results for input(s): VITAMINB12, FOLATE, FERRITIN, TIBC, IRON, RETICCTPCT in the last 72 hours.  Urine analysis: No results found for: COLORURINE, APPEARANCEUR, LABSPEC, PHURINE, GLUCOSEU, HGBUR, BILIRUBINUR, KETONESUR, PROTEINUR, UROBILINOGEN, NITRITE, LEUKOCYTESUR  Sepsis Labs: Lactic Acid, Venous    Component Value Date/Time   LATICACIDVEN 0.85 06/14/2017 1301    MICROBIOLOGY: Recent Results (from the past  240 hour(s))  Blood culture (routine x 2)     Status: None   Collection Time: 06/21/17  7:49 AM  Result Value Ref Range Status   Specimen Description   Final    BLOOD RIGHT ARM Performed at [facility], [address] [place], [place] [address]    Special Requests   Final    BOTTLES DRAWN AEROBIC AND ANAEROBIC Blood Culture results may not be optimal due to an excessive volume of blood received in culture bottles Performed at [facility], [address], [place], [place] [address]    Culture   Final    NO GROWTH 5 DAYS Performed at [facility], [address] [place], [place] [address]    Report Status 06/26/2017 FINAL  Final  Blood culture (routine x 2)     Status: None   Collection Time: 06/21/17  9:31 AM  Result Value Ref Range Status   Specimen Description   Final    BLOOD RIGHT ANTECUBITAL Performed at [facility], [address] [place], [place] [address]    Special Requests   Final    BOTTLES DRAWN AEROBIC AND ANAEROBIC Blood Culture results may not be optimal due to an excessive volume of blood received in culture bottles Performed at [facility], [address], [place], [place] [address]    Culture   Final    NO GROWTH 5 DAYS Performed at [facility], [address] [place], [place] [address]    Report Status 06/26/2017 FINAL  Final  MRSA PCR Screening     Status: None   Collection Time: 06/22/17  3:46 AM  Result Value Ref Range Status   MRSA by PCR NEGATIVE NEGATIVE Final    Comment:        The GeneXpert MRSA Assay (FDA approved for NASAL specimens only), is one component of a comprehensive MRSA colonization surveillance program. It is not intended to diagnose MRSA infection nor to guide or monitor treatment for MRSA infections. Performed at [facility], [address] [place], [place] [address]     RADIOLOGY STUDIES/RESULTS: Dg Chest 2 View  Result Date:  06/24/2017 CLINICAL DATA:  Productive cough, shortness of breath, HIV, previous episode of pneumonia. EXAM: CHEST - 2 VIEW COMPARISON:  Chest x-ray of April 7th 2019 and chest CT scan of the same day FINDINGS: The right lung is clear. There is an air-fluid level  in a cavitary mass at the right lung base. This is better organized than on the previous study. The heart and pulmonary vascularity are normal. The mediastinum is normal in width. There is no pneumothorax. The bony thorax is unremarkable. IMPRESSION: Right lower lobe lung abscess with an air-fluid level. This is slightly better organized today. Electronically Signed   By: David  [place]aziland M.D.   On: 06/24/2017 11:13   Dg Chest 2 View  Result Date: 06/21/2017 CLINICAL DATA:  Pneumonia.  Shortness of breath. EXAM: CHEST - 2 VIEW COMPARISON:  June 16, 2017 FINDINGS: The infiltrate in the left lower lobe appears mildly worsened in the interval. Mild opacity in the medial right base could represent subtle infiltrate versus vascular crowding. No pneumothorax. The heart, hila, and mediastinum are normal. Air-fluid levels are seen behind the heart on the lateral view. No hiatal hernia was seen on the CT scan from June 14, 2017. IMPRESSION: 1. The infiltrate in the left base appears mildly worsened in the interval. Vascular crowding versus subtle infiltrate in the medial right base. 2. An air-fluid level is projected behind the heart on the lateral view is identified. No hiatal hernia was identified on the CT scan from June 14, 2017 an air-fluid level could be associated with the infiltrate. Recommend a CT scan for better evaluation. Electronically Signed   By: Gerome Sam III M.D   On: 06/21/2017 07:56   Dg Chest 2 View  Result Date: 06/16/2017 CLINICAL DATA:  Chest pain, shortness of breath, history of HIV EXAM: CHEST - 2 VIEW COMPARISON:  CT chest of 06/14/2017 and chest x-ray of the same day FINDINGS: There is little change in dense consolidation of the  left lower lobe posteriorly consistent with left lower lobe pneumonia. Otherwise the lungs are clear. Mediastinal and hilar contours are stable. The heart is within normal limits in size. No acute bony abnormality is seen. IMPRESSION: No change in dense consolidation of the left lower lobe consistent with pneumonia. Electronically Signed   By: Dwyane Dee M.D.   On: 06/16/2017 08:10   Dg Chest 2 View  Result Date: 06/14/2017 CLINICAL DATA:  Cough, chest pain and shortness of breath for 4 days. EXAM: CHEST - 2 VIEW COMPARISON:  09/19/2009 and prior chest radiographs FINDINGS: Airspace disease/consolidation involving much of the LEFT LOWER lobe identified likely representing pneumonia. Mild ill-defined opacities within the mid and lower RIGHT lung may represent developing airspace disease/pneumonia. Cardiomediastinal silhouette is otherwise unchanged. There may be a trace LEFT pleural effusion present. There is no evidence of pneumothorax or acute bony abnormality. IMPRESSION: Airspace disease/consolidation involving the majority of the LEFT LOWER lobe likely representing pneumonia. Mild opacities within the mid and lower RIGHT lung which may represent developing airspace disease/pneumonia. Radiographic follow-up to resolution is recommended. Electronically Signed   By: Harmon Pier M.D.   On: 06/14/2017 07:42   Ct Chest W Contrast  Result Date: 06/21/2017 CLINICAL DATA:  Pneumonia.  HIV positive. EXAM: CT CHEST WITH CONTRAST TECHNIQUE: Multidetector CT imaging of the chest was performed during intravenous contrast administration. CONTRAST:  75mL OMNIPAQUE IOHEXOL 300 MG/ML  SOLN COMPARISON:  CT scan June 14, 2017 FINDINGS: Cardiovascular: The thoracic aorta is nonaneurysmal with no dissection or atherosclerotic change. The heart is unchanged. The central pulmonary artery measures 3.8 cm. No obvious filling defects. Mediastinum/Nodes: Thyroid and esophagus are normal. Shotty nodes in the mediastinum without  adenopathy are likely reactive. Lungs/Pleura: Central airways are normal. The patient's known left lower lobe pneumonia  is again identified. There is a rounded consolidated component as seen on series 7, image 100 measuring 9.5 by 7.8 by 10.3 cm containing an air-fluid level consistent with necrotic lung/abscess. This accounts for the recent chest x-ray finding. Mild patchy ground-glass opacity in the right upper lobe is likely infectious, less focal in the interval. There is a left-sided pleural effusion which is small, extending into the left fissure. Upper Abdomen: No acute abnormality. Musculoskeletal: No chest wall abnormality. No acute or significant osseous findings. IMPRESSION: 1. Left lower lobe pneumonia. There is now a large rounded collection within the infiltrate containing air-fluid levels consistent with necrosis/abscess formation. 2. Mild patchy infectious change in the right upper lobe. 3. Reactive nodes in the mediastinum. 4. The main pulmonary artery measures 3.8 cm which is mildly dilated raising the possibility of pulmonary hypertension. 5. No other abnormalities. Electronically Signed   By: Gerome Sam III M.D   On: 06/21/2017 11:03   Ct Angio Chest Pe W And/or Wo Contrast  Result Date: 06/14/2017 CLINICAL DATA:  Acute generalized abdominal pain EXAM: CT ANGIOGRAPHY CHEST CT ABDOMEN AND PELVIS WITH CONTRAST TECHNIQUE: Multidetector CT imaging of the chest was performed using the standard protocol during bolus administration of intravenous contrast. Multiplanar CT image reconstructions and MIPs were obtained to evaluate the vascular anatomy. Multidetector CT imaging of the abdomen and pelvis was performed using the standard protocol during bolus administration of intravenous contrast. CONTRAST:  ISOVUE-370 IOPAMIDOL (ISOVUE-370) INJECTION 76% COMPARISON:  None. FINDINGS: CTA CHEST FINDINGS Cardiovascular: Satisfactory opacification of the pulmonary arteries to the segmental level. No  evidence of pulmonary embolism. Normal heart size. No pericardial effusion. Mediastinum/Nodes: Enlarged subcarinal lymph node measuring 13 mm. Mild left hilar lymphadenopathy likely reactive. No axillary lymphadenopathy. Normal trachea and esophagus. Normal thyroid gland. Lungs/Pleura: Dense left lower lobe consolidation. 13 mm right lower lobe pulmonary nodule. 13 mm right upper lobe pulmonary nodule. Hazy airspace disease in the right upper lobe. Small left pleural effusion. No pneumothorax. Musculoskeletal: No acute osseous abnormality. No aggressive osseous lesion. Review of the MIP images confirms the above findings. CT ABDOMEN and PELVIS FINDINGS Hepatobiliary: No focal liver abnormality is seen. No gallstones, gallbladder wall thickening, or biliary dilatation. Pancreas: Unremarkable. No pancreatic ductal dilatation or surrounding inflammatory changes. Spleen: Multiple calcifications in the spleen as can be seen with prior granulomatous disease. Adrenals/Urinary Tract: Adrenal glands are unremarkable. No urolithiasis or obstructive uropathy. 18 mm hypodense, fluid attenuating right interpolar renal mass most consistent with a cyst. Bladder is unremarkable. Stomach/Bowel: Small hiatal hernia. Stomach is otherwise within normal limits. No evidence of bowel wall thickening, distention, or inflammatory changes. Vascular/Lymphatic: Normal caliber abdominal aorta. Bilateral periaortic calcified lymph node as can be seen with prior granulomatous disease. Reproductive: Prostate is unremarkable. Other: No abdominal wall hernia or abnormality. No abdominopelvic ascites. Musculoskeletal: No aggressive osseous lesion. Left total hip arthroplasty. No acute osseous abnormality. Severe osteoarthritis of the right hip. Review of the MIP images confirms the above findings. IMPRESSION: 1. No pulmonary embolus. 2. Dense left lower lobe pneumonia. 3. Mild hazy right upper lobe airspace disease concerning for pneumonia. 4. 13 mm  right upper lobe and right lower lobe pulmonary nodules which may be secondary to an inflammatory or neoplastic etiology. Recommend follow-up CT of the chest in 3 months. Electronically Signed   By: Elige Ko   On: 06/14/2017 12:41   Ct Pelvis Wo Contrast  Result Date: 06/07/2017 CLINICAL DATA:  52 year old male with right hip pain. EXAM: CT PELVIS WITHOUT  CONTRAST TECHNIQUE: Multidetector CT imaging of the pelvis was performed following the standard protocol without intravenous contrast. COMPARISON:  Pelvic radiograph dated 04/13/2017 and CT of the abdomen pelvis dated 10/21/2013. FINDINGS: Urinary Tract: The visualized distal ureters and urinary bladder appear unremarkable. Bowel: No bowel dilatation or active inflammation within the pelvis. Vascular/Lymphatic: The visualized vasculature appear unremarkable. A top-normal left external iliac chain lymph node measures 1 cm in short axis, of indeterminate clinical significance, likely reactive. Reproductive: The prostate and seminal vesicles are grossly unremarkable. Other:  None Musculoskeletal: There is a total left arthroplasty. The arthroplasty components appear intact and in anatomic alignment. There is heterotopic bone formation adjacent to the left greater trochanter. The appearance of the arthroplasty and acetabular screws are similar to the prior CT. There is osteopenia of the left hip primarily adjacent to the acetabular cup, increased since the prior CT. There is severe osteoarthritis of the right hip with slight flattening of the right femoral head and narrowing of the joint space similar or slightly progressed compared to the CT of 2015. There is no acute fracture. No dislocation. IMPRESSION: Severe osteoarthritic changes of the right hip similar or slightly progressed compared to the prior CT. Mild flattening and fragmentation of the right femoral head may be sequela of underlying avascular necrosis. Left hip arthroplasty appears intact.  Progression of osteopenia surrounding the acetabular cup of the left hip. No acute fracture. Electronically Signed   By: Elgie CollardArash  Radparvar M.D.   On: 06/07/2017 22:29   [place] Chest (pleural Effusion)  Result Date: 06/23/2017 CLINICAL DATA:  52 year old male who presents for left-sided thoracentesis. EXAM: CHEST ULTRASOUND COMPARISON:  CT scan of the chest 06/21/2017 FINDINGS: Sonographic interrogation of the left pleural space demonstrates trace pleural fluid. There is insufficient fluid for safe thoracentesis. The procedure was deferred. IMPRESSION: Trace left-sided pleural effusion, insufficient for thoracentesis. Electronically Signed   By: Malachy MoanHeath  McCullough M.D.   On: 06/23/2017 17:13   Ct Abdomen Pelvis W Contrast  Result Date: 06/14/2017 CLINICAL DATA:  Acute generalized abdominal pain EXAM: CT ANGIOGRAPHY CHEST CT ABDOMEN AND PELVIS WITH CONTRAST TECHNIQUE: Multidetector CT imaging of the chest was performed using the standard protocol during bolus administration of intravenous contrast. Multiplanar CT image reconstructions and MIPs were obtained to evaluate the vascular anatomy. Multidetector CT imaging of the abdomen and pelvis was performed using the standard protocol during bolus administration of intravenous contrast. CONTRAST:  ISOVUE-370 IOPAMIDOL (ISOVUE-370) INJECTION 76% COMPARISON:  None. FINDINGS: CTA CHEST FINDINGS Cardiovascular: Satisfactory opacification of the pulmonary arteries to the segmental level. No evidence of pulmonary embolism. Normal heart size. No pericardial effusion. Mediastinum/Nodes: Enlarged subcarinal lymph node measuring 13 mm. Mild left hilar lymphadenopathy likely reactive. No axillary lymphadenopathy. Normal trachea and esophagus. Normal thyroid gland. Lungs/Pleura: Dense left lower lobe consolidation. 13 mm right lower lobe pulmonary nodule. 13 mm right upper lobe pulmonary nodule. Hazy airspace disease in the right upper lobe. Small left pleural effusion. No  pneumothorax. Musculoskeletal: No acute osseous abnormality. No aggressive osseous lesion. Review of the MIP images confirms the above findings. CT ABDOMEN and PELVIS FINDINGS Hepatobiliary: No focal liver abnormality is seen. No gallstones, gallbladder wall thickening, or biliary dilatation. Pancreas: Unremarkable. No pancreatic ductal dilatation or surrounding inflammatory changes. Spleen: Multiple calcifications in the spleen as can be seen with prior granulomatous disease. Adrenals/Urinary Tract: Adrenal glands are unremarkable. No urolithiasis or obstructive uropathy. 18 mm hypodense, fluid attenuating right interpolar renal mass most consistent with a cyst. Bladder is unremarkable. Stomach/Bowel: Small hiatal  hernia. Stomach is otherwise within normal limits. No evidence of bowel wall thickening, distention, or inflammatory changes. Vascular/Lymphatic: Normal caliber abdominal aorta. Bilateral periaortic calcified lymph node as can be seen with prior granulomatous disease. Reproductive: Prostate is unremarkable. Other: No abdominal wall hernia or abnormality. No abdominopelvic ascites. Musculoskeletal: No aggressive osseous lesion. Left total hip arthroplasty. No acute osseous abnormality. Severe osteoarthritis of the right hip. Review of the MIP images confirms the above findings. IMPRESSION: 1. No pulmonary embolus. 2. Dense left lower lobe pneumonia. 3. Mild hazy right upper lobe airspace disease concerning for pneumonia. 4. 13 mm right upper lobe and right lower lobe pulmonary nodules which may be secondary to an inflammatory or neoplastic etiology. Recommend follow-up CT of the chest in 3 months. Electronically Signed   By: Elige Ko   On: 06/14/2017 12:41   Dg Chest Port 1 View  Result Date: 06/27/2017 CLINICAL DATA:  History of lung abscess. EXAM: PORTABLE CHEST 1 VIEW COMPARISON:  Chest x-rays dated 06/25/2017 and 06/24/2017. Chest CT dated 06/21/2017. FINDINGS: Lucent abscess cavity is seen  over the LEFT heart border, corresponding to the abscess collection demonstrated on CT of 06/21/2017, but without convincing air-fluid level as seen on the recent chest x-rays and earlier chest CT. Small adjacent LEFT pleural effusion is stable. RIGHT lung remains clear. Heart size and mediastinal contours are stable. IMPRESSION: 1. Lucent abscess cavity within the LEFT lower lung. Associated air-fluid levels within the abscess cavity were seen on previous exams. No air-fluid level appreciated within the cavity on today's exam, possibly positional in nature as indicated on yesterday's chest x-ray report, possibly indicating interval improvement. 2. Small adjacent LEFT pleural effusion appears stable. 3. No new lung findings. Electronically Signed   By: Bary Richard M.D.   On: 06/27/2017 07:13   Dg Chest Port 1 View  Result Date: 06/25/2017 CLINICAL DATA:  Lung abscess EXAM: PORTABLE CHEST 1 VIEW COMPARISON:  Yesterday FINDINGS: History of lung abscess with cavity at the left base. Fluid level is not seen today, presumably positional. Stable small left effusion. The right lung is clear other than mild scarring or atelectasis at the base. Interface at the left apex is best attributed to the third rib cortex. No continuous pleural line is seen for pneumothorax. Normal heart size. IMPRESSION: Stable left lower lobe pneumonia, cavity, and small effusion. Electronically Signed   By: Marnee Spring M.D.   On: 06/25/2017 07:51   Dg Knee Complete 4 Views Right  Result Date: 06/07/2017 CLINICAL DATA:  Knee pain EXAM: RIGHT KNEE - COMPLETE 4+ VIEW COMPARISON:  None. FINDINGS: No evidence of fracture, dislocation, or joint effusion. No evidence of arthropathy or other focal bone abnormality. Soft tissues are unremarkable. IMPRESSION: Negative. Electronically Signed   By: Kennith Center M.D.   On: 06/07/2017 22:38   Dg Femur, Min 2 Views Right  Result Date: 06/07/2017 CLINICAL DATA:  Pain. EXAM: RIGHT FEMUR 2 VIEWS  COMPARISON:  None. FINDINGS: Marked loss of joint space with subchondral sclerosis and subchondral cyst formation noted in the acetabulum and femoral head. Bones are demineralized. No evidence for femur fracture. No suspicious lytic or sclerotic osseous abnormality within the femur. 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 Electronically Signed   By: Kennith Center M.D.   On: 06/07/2017 20:35     LOS: 7 days   Signature  Susa Raring M.D on 06/28/2017 at 2:39 PM  Between 7am to 7pm - Pager - [phone] ( page via  [email or web address], text pages only, please mention full 10 digit call back number).  After 7pm go to [email or web address] - password [facility]

## 2017-06-29 ENCOUNTER — Inpatient Hospital Stay: Payer: Self-pay

## 2017-06-29 ENCOUNTER — Encounter (HOSPITAL_COMMUNITY): Payer: Self-pay | Admitting: Radiology

## 2017-06-29 ENCOUNTER — Inpatient Hospital Stay (HOSPITAL_COMMUNITY): Payer: Medicare Other

## 2017-06-29 LAB — CBC
HCT: 33.2 % — ABNORMAL LOW (ref 39.0–52.0)
HEMOGLOBIN: 10.5 g/dL — AB (ref 13.0–17.0)
MCH: 25.9 pg — AB (ref 26.0–34.0)
MCHC: 31.6 g/dL (ref 30.0–36.0)
MCV: 82 fL (ref 78.0–100.0)
Platelets: 583 10*3/uL — ABNORMAL HIGH (ref 150–400)
RBC: 4.05 MIL/uL — AB (ref 4.22–5.81)
RDW: 16.2 % — ABNORMAL HIGH (ref 11.5–15.5)
WBC: 6.6 10*3/uL (ref 4.0–10.5)

## 2017-06-29 LAB — T-HELPER CELLS (CD4) COUNT (NOT AT ARMC)
CD4 % Helper T Cell: 14 % — ABNORMAL LOW (ref 33–55)
CD4 T CELL ABS: 310 /uL — AB (ref 400–2700)

## 2017-06-29 LAB — PROTIME-INR
INR: 1.13
PROTHROMBIN TIME: 14.5 s (ref 11.4–15.2)

## 2017-06-29 LAB — BASIC METABOLIC PANEL
Anion gap: 11 (ref 5–15)
BUN: 11 mg/dL (ref 6–20)
CHLORIDE: 103 mmol/L (ref 101–111)
CO2: 24 mmol/L (ref 22–32)
Calcium: 9 mg/dL (ref 8.9–10.3)
Creatinine, Ser: 1.05 mg/dL (ref 0.61–1.24)
GFR calc Af Amer: >60 mL/min (ref >60)
GFR calc non Af Amer: >60 mL/min (ref >60)
Glucose, Bld: 115 mg/dL — ABNORMAL HIGH (ref 65–99)
POTASSIUM: 4 mmol/L (ref 3.5–5.1)
SODIUM: 138 mmol/L (ref 135–145)

## 2017-06-29 MED ORDER — LACTATED RINGERS IV SOLN
INTRAVENOUS | Status: AC
  Administered 2017-06-29: 09:00:00 via INTRAVENOUS

## 2017-06-29 MED ORDER — IOHEXOL 300 MG/ML  SOLN
75.0000 mL | Freq: Once | INTRAMUSCULAR | Status: AC | PRN
  Administered 2017-06-29: 75 mL via INTRAVENOUS

## 2017-06-29 NOTE — Progress Notes (Signed)
PROGRESS NOTE        PATIENT DETAILS Name: [name] Age: 52 y.o. Sex: male Date of Birth: 03/07/1966 Admit Date: 06/21/2017 Admitting Physician Renae FickleMackenzie Short, MD ZOX:[id number]:Patient, No Pcp Per  Brief Narrative: Patient is a 52 y.o. male prior history of HIV (CD4 count of 280 on 06/07/17)- recent hospitalization from 3/31-4/4 due to sepsis-pneumonia-pneumococcal bacteremia-sent home on oral amoxicillin-presented to the hospital with worsening cough, shortness of breath-found to have a lung abscess-and admitted to the hospitalist service.  See below for further details  Subjective:  Patient in bed, appears comfortable, denies any headache, no fever, no chest pain or pressure, no shortness of breath , no abdominal pain. No focal weakness.  Assessment/Plan:  Left lung abscess, recent pansensitive Streptococcal Pneumonae pneumonia and bacteremia a few weeks ago: Overall much better clinically and symptomatically is on empiric IV Vancomycin and Unasyn, thoracentesis was attempted on 06/23/2017 with minimal fluid extraction hence this was not pursued further.  He has been seen by pulmonary here, refused to see ID.  Repeat CT scan on 06/29/2017 shows that the left lower lobe abscess has improved in size, he is clinically much better as well, no fever or leukocytosis, appears nontoxic.  Plan is to place PICC line thereafter will transition him to IV Unasyn for 4 weeks with 2-week appointment with ID and pulmonary for repeat imaging and deciding on full course of treatment.  Will check strep pneumonia antigen, sputum cultures pending, blood cultures negative so far, will also check CD4 count and HIV viral load, per patient he is very compliant with his HIV medications for the last 20 years.    Minimally elevated troponin: Trend is flat-not consistent with ACS-recent echocardiogram showed preserved EF.  No further workup required.  One episode of hematochezia: Occurred on  4/9-none since then.  Suspect hemorrhoids.  Refused Anusol.  HIV: He is HIV in good control for the last 20 years and he has been compliant with his medications, refused to see ID here, was counseled. Asthma: Stable continue as needed bronchodilators and oxygen as needed.  Iron deficiency anemia: Iron supplementation-repeat anemia panel in 1 month.  Defer endoscopic evaluation if not done to the outpatient setting.    Lung nodules: Chest CT in 3 months with outpatient pulmonary follow-up.  GERD: On PPI continue    DVT Prophylaxis: Prophylactic Lovenox   Code Status: Full code   Family Communication: None at bedside  Disposition Plan: Remain inpatient-home sometime next week  Antimicrobial agents: Anti-infectives (From admission, onward)   Start     Dose/Rate Route Frequency Ordered Stop   06/21/17 1700  Darunavir-Cobicisctat-Emtricitabine-Tenofovir Alafenamide (SYMTUZA) 800-150-200-10 MG TABS 1 tablet     1 tablet Oral Daily with breakfast 06/21/17 1347     06/21/17 1700  dolutegravir (TIVICAY) tablet 50 mg     50 mg Oral Daily 06/21/17 1347     06/21/17 1700  Ampicillin-Sulbactam (UNASYN) 3 g in sodium chloride 0.9 % 100 mL IVPB     3 g 200 mL/hr over 30 Minutes Intravenous Every 6 hours 06/21/17 1417     06/21/17 1700  vancomycin (VANCOCIN) 1,250 mg in sodium chloride 0.9 % 250 mL IVPB  Status:  Discontinued     1,250 mg 166.7 mL/hr over 90 Minutes Intravenous Every 12 hours 06/21/17 1417 06/29/17 1013   06/21/17 1600  fluconazole (DIFLUCAN) tablet  200 mg     200 mg Oral Daily 06/21/17 1347     06/21/17 1600  valACYclovir (VALTREX) tablet 500 mg     500 mg Oral Daily 06/21/17 1347     06/21/17 1015  vancomycin (VANCOCIN) IVPB 1000 mg/200 mL premix     1,000 mg 200 mL/hr over 60 Minutes Intravenous  Once 06/21/17 1004 06/21/17 1233   06/21/17 1015  piperacillin-tazobactam (ZOSYN) IVPB 3.375 g     3.375 g 100 mL/hr over 30 Minutes Intravenous  Once 06/21/17 1004  06/21/17 1233      Procedures: None  CONSULTS:  pulmonary/intensive care  Time spent: 25- minutes-Greater than 50% of this time was spent in counseling, explanation of diagnosis, planning of further management, and coordination of care.  MEDICATIONS: Scheduled Meds: . aspirin EC  325 mg Oral Daily  . Darunavir-Cobicisctat-Emtricitabine-Tenofovir Alafenamide  1 tablet Oral Q breakfast  . dextromethorphan-guaiFENesin  1 tablet Oral BID  . dolutegravir  50 mg Oral Daily  . enoxaparin (LOVENOX) injection  40 mg Subcutaneous Q24H  . ferrous sulfate  325 mg Oral Q breakfast  . fluconazole  200 mg Oral Daily  . gabapentin  100 mg Oral TID  . lactose free nutrition  237 mL Oral TID WC  . magnesium oxide  400 mg Oral BID  . valACYclovir  500 mg Oral Daily   Continuous Infusions: . ampicillin-sulbactam (UNASYN) IV Stopped (06/29/17 0501)  . lactated ringers 125 mL/hr at 06/29/17 0852   PRN Meds:.acetaminophen **OR** acetaminophen, albuterol, benzonatate, bisacodyl, chlorpheniramine-HYDROcodone, guaiFENesin-dextromethorphan, ibuprofen, nitroGLYCERIN, ondansetron **OR** ondansetron (ZOFRAN) IV, polyethylene glycol, zolpidem   PHYSICAL EXAM: Vital signs: Vitals:   06/28/17 0629 06/28/17 1315 06/28/17 2149 06/29/17 0454  BP: 115/69 126/82 114/84 108/73  Pulse: 71 72 80 67  Resp:  18 18 18   Temp: 98.2 F (36.8 C) 97.9 F (36.6 C) 97.9 F (36.6 C) 98 F (36.7 C)  TempSrc: Oral Oral Oral Oral  SpO2: 99% 96% 98% 96%  Weight:      Height:       Filed Weights   06/21/17 0626 06/21/17 1532  Weight: 88 kg (194 lb) 83.9 kg (185 lb)   Body mass index is 23.75 kg/m.   Exam   Awake Alert, Oriented X 3, No new F.N deficits, Normal affect [place].AT,PERRAL Supple Neck,No JVD, No cervical lymphadenopathy appriciated.  Symmetrical Chest wall movement, Good air movement bilaterally, few LLL rales RRR,No Gallops, Rubs or new Murmurs, No Parasternal Heave +ve B.Sounds, Abd Soft, No  tenderness, No organomegaly appriciated, No rebound - guarding or rigidity. No Cyanosis, Clubbing or edema, No new Rash or bruise    I have personally reviewed following labs and imaging studies  LABORATORY DATA: CBC: Recent Labs  Lab 06/23/17 0454 06/24/17 0447 06/25/17 0443 06/27/17 0438 06/29/17 0454  WBC 10.7* 6.1 7.1 6.3 6.6  HGB 8.1* 8.6* 9.1* 10.4* 10.5*  HCT 24.9* 26.8* 28.8* 33.5* 33.2*  MCV 79.3 79.5 81.1 82.5 82.0  PLT 450* 510* 605* 675* 583*    Basic Metabolic Panel: Recent Labs  Lab 06/23/17 0454 06/24/17 0447 06/25/17 0443 06/27/17 0438 06/29/17 0454  NA 137 140 138 139 138  K 3.2* 3.9 4.2 4.2 4.0  CL 110 112* 109 103 103  CO2 17* 19* 20* 26 24  GLUCOSE 102* 100* 91 93 115*  BUN 10 13 9 11 11   CREATININE 0.91 0.89 0.90 1.01 1.05  CALCIUM 8.2* 8.4* 8.5* 8.9 9.0    GFR: Estimated Creatinine Clearance: 97.9  mL/min (by C-G formula based on SCr of 1.05 mg/dL).  Liver Function Tests: No results for input(s): AST, ALT, ALKPHOS, BILITOT, PROT, ALBUMIN in the last 168 hours. No results for input(s): LIPASE, AMYLASE in the last 168 hours. No results for input(s): AMMONIA in the last 168 hours.  Coagulation Profile: Recent Labs  Lab 06/29/17 0454  INR 1.13    Cardiac Enzymes: No results for input(s): CKTOTAL, CKMB, CKMBINDEX, TROPONINI in the last 168 hours.  BNP (last 3 results) No results for input(s): PROBNP in the last 8760 hours.  HbA1C: No results for input(s): HGBA1C in the last 72 hours.  CBG: No results for input(s): GLUCAP in the last 168 hours.  Lipid Profile: No results for input(s): CHOL, HDL, LDLCALC, TRIG, CHOLHDL, LDLDIRECT in the last 72 hours.  Thyroid Function Tests: No results for input(s): TSH, T4TOTAL, FREET4, T3FREE, THYROIDAB in the last 72 hours.  Anemia Panel: No results for input(s): VITAMINB12, FOLATE, FERRITIN, TIBC, IRON, RETICCTPCT in the last 72 hours.  Urine analysis: No results found for: COLORURINE,  APPEARANCEUR, LABSPEC, PHURINE, GLUCOSEU, HGBUR, BILIRUBINUR, KETONESUR, PROTEINUR, UROBILINOGEN, NITRITE, LEUKOCYTESUR  Sepsis Labs: Lactic Acid, Venous    Component Value Date/Time   LATICACIDVEN 0.85 06/14/2017 1301    MICROBIOLOGY: Recent Results (from the past 240 hour(s))  Blood culture (routine x 2)     Status: None   Collection Time: 06/21/17  7:49 AM  Result Value Ref Range Status   Specimen Description   Final    BLOOD RIGHT ARM Performed at [facility], [address], [place], [place] [address]    Special Requests   Final    BOTTLES DRAWN AEROBIC AND ANAEROBIC Blood Culture results may not be optimal due to an excessive volume of blood received in culture bottles Performed at [facility], [address], [place], [place] [address]    Culture   Final    NO GROWTH 5 DAYS Performed at [facility], [address] [place], [place] [address]    Report Status 06/26/2017 FINAL  Final  Blood culture (routine x 2)     Status: None   Collection Time: 06/21/17  9:31 AM  Result Value Ref Range Status   Specimen Description   Final    BLOOD RIGHT ANTECUBITAL Performed at [facility], [address] [place], [place] [address]    Special Requests   Final    BOTTLES DRAWN AEROBIC AND ANAEROBIC Blood Culture results may not be optimal due to an excessive volume of blood received in culture bottles Performed at [facility], [address], [place], [place] [address]    Culture   Final    NO GROWTH 5 DAYS Performed at [facility], [address], [place], [place] [address]    Report Status 06/26/2017 FINAL  Final  MRSA PCR Screening     Status: None   Collection Time: 06/22/17  3:46 AM  Result Value Ref Range Status   MRSA by PCR NEGATIVE NEGATIVE Final    Comment:        The GeneXpert MRSA Assay (FDA approved for NASAL specimens only), is one component of a comprehensive MRSA colonization surveillance  program. It is not intended to diagnose MRSA infection nor to guide or monitor treatment for MRSA infections. Performed at [facility], [address] [place], [place] [address]   Culture, expectorated sputum-assessment     Status: None   Collection  Time: 06/28/17  2:41 PM  Result Value Ref Range Status   Specimen Description SPUTUM  Final   Special Requests NONE  Final   Sputum evaluation   Final    THIS SPECIMEN IS ACCEPTABLE FOR SPUTUM CULTURE Performed at [facility], [address] [place], [place] [address]    Report Status 06/28/2017 FINAL  Final    RADIOLOGY STUDIES/RESULTS: Dg Chest 2 View  Result Date: 06/24/2017 CLINICAL DATA:  Productive cough, shortness of breath, HIV, previous episode of pneumonia. EXAM: CHEST - 2 VIEW COMPARISON:  Chest x-ray of April 7th 2019 and chest CT scan of the same day FINDINGS: The right lung is clear. There is an air-fluid level in a cavitary mass at the right lung base. This is better organized than on the previous study. The heart and pulmonary vascularity are normal. The mediastinum is normal in width. There is no pneumothorax. The bony thorax is unremarkable. IMPRESSION: Right lower lobe lung abscess with an air-fluid level. This is slightly better organized today. Electronically Signed   By: David  [place] M.D.   On: 06/24/2017 11:13   Dg Chest 2 View  Result Date: 06/21/2017 CLINICAL DATA:  Pneumonia.  Shortness of breath. EXAM: CHEST - 2 VIEW COMPARISON:  June 16, 2017 FINDINGS: The infiltrate in the left lower lobe appears mildly worsened in the interval. Mild opacity in the medial right base could represent subtle infiltrate versus vascular crowding. No pneumothorax. The heart, hila, and mediastinum are normal. Air-fluid levels are seen behind the heart on the lateral view. No hiatal hernia was seen on the CT scan from June 14, 2017. IMPRESSION: 1. The infiltrate in the left base appears mildly  worsened in the interval. Vascular crowding versus subtle infiltrate in the medial right base. 2. An air-fluid level is projected behind the heart on the lateral view is identified. No hiatal hernia was identified on the CT scan from June 14, 2017 an air-fluid level could be associated with the infiltrate. Recommend a CT scan for better evaluation. Electronically Signed   By: Gerome Sam III M.D   On: 06/21/2017 07:56   Dg Chest 2 View  Result Date: 06/16/2017 CLINICAL DATA:  Chest pain, shortness of breath, history of HIV EXAM: CHEST - 2 VIEW COMPARISON:  CT chest of 06/14/2017 and chest x-ray of the same day FINDINGS: There is little change in dense consolidation of the left lower lobe posteriorly consistent with left lower lobe pneumonia. Otherwise the lungs are clear. Mediastinal and hilar contours are stable. The heart is within normal limits in size. No acute bony abnormality is seen. IMPRESSION: No change in dense consolidation of the left lower lobe consistent with pneumonia. Electronically Signed   By: Dwyane Dee M.D.   On: 06/16/2017 08:10   Dg Chest 2 View  Result Date: 06/14/2017 CLINICAL DATA:  Cough, chest pain and shortness of breath for 4 days. EXAM: CHEST - 2 VIEW COMPARISON:  09/19/2009 and prior chest radiographs FINDINGS: Airspace disease/consolidation involving much of the LEFT LOWER lobe identified likely representing pneumonia. Mild ill-defined opacities within the mid and lower RIGHT lung may represent developing airspace disease/pneumonia. Cardiomediastinal silhouette is otherwise unchanged. There may be a trace LEFT pleural effusion present. There is no evidence of pneumothorax or acute bony abnormality. IMPRESSION: Airspace disease/consolidation involving the majority of the LEFT LOWER lobe likely representing pneumonia. Mild opacities within the mid and lower RIGHT lung which may represent developing airspace disease/pneumonia. Radiographic follow-up to resolution is  recommended. Electronically Signed   By: Harmon Pier M.D.   On: 06/14/2017 07:42   Ct Chest W Contrast  Result Date: 06/29/2017 CLINICAL DATA:  Lung abscess EXAM: CT CHEST WITH CONTRAST TECHNIQUE: Multidetector CT imaging of the chest was performed during intravenous contrast administration. CONTRAST:  75mL OMNIPAQUE IOHEXOL 300 MG/ML  SOLN COMPARISON:  Chest CT June 21, 2017 and chest radiograph June 27, 2017 FINDINGS: Cardiovascular: There is no appreciable thoracic aortic aneurysm or dissection. The visualized great vessels appear normal. No major vessel pulmonary embolus. No pericardial effusion or pericardial thickening. There is prominence of the main pulmonary outflow tract measuring 3.3 cm in diameter. Mediastinum/Nodes: Thyroid appears normal. Scattered subcentimeter mediastinal lymph nodes are present, similar to prior study. No adenopathy by size criteria evident. No esophageal lesions. Lungs/Pleura: In comparison with the recent CT, the abscess seen in the left lower lobe currently measures 7.2 x 5.4 x 5.1 cm, compared to a measurement of 9.5 x 7.8 x 10.3 cm. There is consolidation throughout much of the remaining left lower lobe, similar to recent study. There is a small left pleural effusion. No consolidation or abscess noted elsewhere. There is atelectatic change in the right lung base posteriorly. No edema or consolidation evident on the right. Upper Abdomen: . Spleen appears prominent although incompletely visualized. Spleen measures 13.7 cm in length from anterior to posterior dimension. Visualized upper abdominal structures otherwise appear unremarkable. Musculoskeletal: No blastic or lytic bone lesions. No evident chest wall lesions. IMPRESSION: 1. The left lower lobe lung abscess is smaller compared to prior study. There is air-fluid level as well as thickened material in the dependent portion of this abscess. There is surrounding consolidation throughout much of the left lower lobe with a  small left pleural effusion, similar to prior study. 2. Right lung clear except for mild right base atelectasis posteriorly. 3. Multiple subcentimeter mediastinal lymph nodes. By size criteria, there is no evident adenopathy. 4. Prominence the main pulmonary outflow tract consistent with a degree of pulmonary arterial hypertension. 5. Spleen appears enlarged although incompletely visualized. There are multiple calcified splenic granulomas. Electronically Signed   By: Bretta Bang III M.D.   On: 06/29/2017 08:22   Ct Chest W Contrast  Result Date: 06/21/2017 CLINICAL DATA:  Pneumonia.  HIV positive. EXAM: CT CHEST WITH CONTRAST TECHNIQUE: Multidetector CT imaging of the chest was performed during intravenous contrast administration. CONTRAST:  75mL OMNIPAQUE IOHEXOL 300 MG/ML  SOLN COMPARISON:  CT scan June 14, 2017 FINDINGS: Cardiovascular: The thoracic aorta is nonaneurysmal with no dissection or atherosclerotic change. The heart is unchanged. The central pulmonary artery measures 3.8 cm. No obvious filling defects. Mediastinum/Nodes: Thyroid and esophagus are normal. Shotty nodes in the mediastinum without adenopathy are likely reactive. Lungs/Pleura: Central airways are normal. The patient's known left lower lobe pneumonia is again identified. There is a rounded consolidated component as seen on series 7, image 100 measuring 9.5 by 7.8 by 10.3 cm containing an air-fluid level consistent with necrotic lung/abscess. This accounts for the recent chest x-ray finding. Mild patchy ground-glass opacity in the right upper lobe is likely infectious, less focal in the interval. There is a left-sided pleural effusion which is small, extending into the left fissure. Upper Abdomen: No acute abnormality. Musculoskeletal: No chest wall abnormality. No acute or significant osseous findings. IMPRESSION: 1. Left lower lobe pneumonia. There is now a large rounded collection within the infiltrate containing air-fluid levels  consistent with necrosis/abscess formation. 2. Mild patchy infectious change in the right  upper lobe. 3. Reactive nodes in the mediastinum. 4. The main pulmonary artery measures 3.8 cm which is mildly dilated raising the possibility of pulmonary hypertension. 5. No other abnormalities. Electronically Signed   By: Gerome Sam III M.D   On: 06/21/2017 11:03   Ct Angio Chest Pe W And/or Wo Contrast  Result Date: 06/14/2017 CLINICAL DATA:  Acute generalized abdominal pain EXAM: CT ANGIOGRAPHY CHEST CT ABDOMEN AND PELVIS WITH CONTRAST TECHNIQUE: Multidetector CT imaging of the chest was performed using the standard protocol during bolus administration of intravenous contrast. Multiplanar CT image reconstructions and MIPs were obtained to evaluate the vascular anatomy. Multidetector CT imaging of the abdomen and pelvis was performed using the standard protocol during bolus administration of intravenous contrast. CONTRAST:  ISOVUE-370 IOPAMIDOL (ISOVUE-370) INJECTION 76% COMPARISON:  None. FINDINGS: CTA CHEST FINDINGS Cardiovascular: Satisfactory opacification of the pulmonary arteries to the segmental level. No evidence of pulmonary embolism. Normal heart size. No pericardial effusion. Mediastinum/Nodes: Enlarged subcarinal lymph node measuring 13 mm. Mild left hilar lymphadenopathy likely reactive. No axillary lymphadenopathy. Normal trachea and esophagus. Normal thyroid gland. Lungs/Pleura: Dense left lower lobe consolidation. 13 mm right lower lobe pulmonary nodule. 13 mm right upper lobe pulmonary nodule. Hazy airspace disease in the right upper lobe. Small left pleural effusion. No pneumothorax. Musculoskeletal: No acute osseous abnormality. No aggressive osseous lesion. Review of the MIP images confirms the above findings. CT ABDOMEN and PELVIS FINDINGS Hepatobiliary: No focal liver abnormality is seen. No gallstones, gallbladder wall thickening, or biliary dilatation. Pancreas: Unremarkable. No pancreatic  ductal dilatation or surrounding inflammatory changes. Spleen: Multiple calcifications in the spleen as can be seen with prior granulomatous disease. Adrenals/Urinary Tract: Adrenal glands are unremarkable. No urolithiasis or obstructive uropathy. 18 mm hypodense, fluid attenuating right interpolar renal mass most consistent with a cyst. Bladder is unremarkable. Stomach/Bowel: Small hiatal hernia. Stomach is otherwise within normal limits. No evidence of bowel wall thickening, distention, or inflammatory changes. Vascular/Lymphatic: Normal caliber abdominal aorta. Bilateral periaortic calcified lymph node as can be seen with prior granulomatous disease. Reproductive: Prostate is unremarkable. Other: No abdominal wall hernia or abnormality. No abdominopelvic ascites. Musculoskeletal: No aggressive osseous lesion. Left total hip arthroplasty. No acute osseous abnormality. Severe osteoarthritis of the right hip. Review of the MIP images confirms the above findings. IMPRESSION: 1. No pulmonary embolus. 2. Dense left lower lobe pneumonia. 3. Mild hazy right upper lobe airspace disease concerning for pneumonia. 4. 13 mm right upper lobe and right lower lobe pulmonary nodules which may be secondary to an inflammatory or neoplastic etiology. Recommend follow-up CT of the chest in 3 months. Electronically Signed   By: Elige Ko   On: 06/14/2017 12:41   Ct Pelvis Wo Contrast  Result Date: 06/07/2017 CLINICAL DATA:  52 year old male with right hip pain. EXAM: CT PELVIS WITHOUT CONTRAST TECHNIQUE: Multidetector CT imaging of the pelvis was performed following the standard protocol without intravenous contrast. COMPARISON:  Pelvic radiograph dated 04/13/2017 and CT of the abdomen pelvis dated 10/21/2013. FINDINGS: Urinary Tract: The visualized distal ureters and urinary bladder appear unremarkable. Bowel: No bowel dilatation or active inflammation within the pelvis. Vascular/Lymphatic: The visualized vasculature appear  unremarkable. A top-normal left external iliac chain lymph node measures 1 cm in short axis, of indeterminate clinical significance, likely reactive. Reproductive: The prostate and seminal vesicles are grossly unremarkable. Other:  None Musculoskeletal: There is a total left arthroplasty. The arthroplasty components appear intact and in anatomic alignment. There is heterotopic bone formation adjacent to the left greater  trochanter. The appearance of the arthroplasty and acetabular screws are similar to the prior CT. There is osteopenia of the left hip primarily adjacent to the acetabular cup, increased since the prior CT. There is severe osteoarthritis of the right hip with slight flattening of the right femoral head and narrowing of the joint space similar or slightly progressed compared to the CT of 2015. There is no acute fracture. No dislocation. IMPRESSION: Severe osteoarthritic changes of the right hip similar or slightly progressed compared to the prior CT. Mild flattening and fragmentation of the right femoral head may be sequela of underlying avascular necrosis. Left hip arthroplasty appears intact. Progression of osteopenia surrounding the acetabular cup of the left hip. No acute fracture. Electronically Signed   By: Elgie Collard M.D.   On: 06/07/2017 22:29   [place] Chest (pleural Effusion)  Result Date: 06/23/2017 CLINICAL DATA:  52 year old male who presents for left-sided thoracentesis. EXAM: CHEST ULTRASOUND COMPARISON:  CT scan of the chest 06/21/2017 FINDINGS: Sonographic interrogation of the left pleural space demonstrates trace pleural fluid. There is insufficient fluid for safe thoracentesis. The procedure was deferred. IMPRESSION: Trace left-sided pleural effusion, insufficient for thoracentesis. Electronically Signed   By: Malachy Moan M.D.   On: 06/23/2017 17:13   Ct Abdomen Pelvis W Contrast  Result Date: 06/14/2017 CLINICAL DATA:  Acute generalized abdominal pain EXAM: CT  ANGIOGRAPHY CHEST CT ABDOMEN AND PELVIS WITH CONTRAST TECHNIQUE: Multidetector CT imaging of the chest was performed using the standard protocol during bolus administration of intravenous contrast. Multiplanar CT image reconstructions and MIPs were obtained to evaluate the vascular anatomy. Multidetector CT imaging of the abdomen and pelvis was performed using the standard protocol during bolus administration of intravenous contrast. CONTRAST:  ISOVUE-370 IOPAMIDOL (ISOVUE-370) INJECTION 76% COMPARISON:  None. FINDINGS: CTA CHEST FINDINGS Cardiovascular: Satisfactory opacification of the pulmonary arteries to the segmental level. No evidence of pulmonary embolism. Normal heart size. No pericardial effusion. Mediastinum/Nodes: Enlarged subcarinal lymph node measuring 13 mm. Mild left hilar lymphadenopathy likely reactive. No axillary lymphadenopathy. Normal trachea and esophagus. Normal thyroid gland. Lungs/Pleura: Dense left lower lobe consolidation. 13 mm right lower lobe pulmonary nodule. 13 mm right upper lobe pulmonary nodule. Hazy airspace disease in the right upper lobe. Small left pleural effusion. No pneumothorax. Musculoskeletal: No acute osseous abnormality. No aggressive osseous lesion. Review of the MIP images confirms the above findings. CT ABDOMEN and PELVIS FINDINGS Hepatobiliary: No focal liver abnormality is seen. No gallstones, gallbladder wall thickening, or biliary dilatation. Pancreas: Unremarkable. No pancreatic ductal dilatation or surrounding inflammatory changes. Spleen: Multiple calcifications in the spleen as can be seen with prior granulomatous disease. Adrenals/Urinary Tract: Adrenal glands are unremarkable. No urolithiasis or obstructive uropathy. 18 mm hypodense, fluid attenuating right interpolar renal mass most consistent with a cyst. Bladder is unremarkable. Stomach/Bowel: Small hiatal hernia. Stomach is otherwise within normal limits. No evidence of bowel wall thickening,  distention, or inflammatory changes. Vascular/Lymphatic: Normal caliber abdominal aorta. Bilateral periaortic calcified lymph node as can be seen with prior granulomatous disease. Reproductive: Prostate is unremarkable. Other: No abdominal wall hernia or abnormality. No abdominopelvic ascites. Musculoskeletal: No aggressive osseous lesion. Left total hip arthroplasty. No acute osseous abnormality. Severe osteoarthritis of the right hip. Review of the MIP images confirms the above findings. IMPRESSION: 1. No pulmonary embolus. 2. Dense left lower lobe pneumonia. 3. Mild hazy right upper lobe airspace disease concerning for pneumonia. 4. 13 mm right upper lobe and right lower lobe pulmonary nodules which may be secondary to an  inflammatory or neoplastic etiology. Recommend follow-up CT of the chest in 3 months. Electronically Signed   By: Elige Ko   On: 06/14/2017 12:41   Dg Chest Port 1 View  Result Date: 06/27/2017 CLINICAL DATA:  History of lung abscess. EXAM: PORTABLE CHEST 1 VIEW COMPARISON:  Chest x-rays dated 06/25/2017 and 06/24/2017. Chest CT dated 06/21/2017. FINDINGS: Lucent abscess cavity is seen over the LEFT heart border, corresponding to the abscess collection demonstrated on CT of 06/21/2017, but without convincing air-fluid level as seen on the recent chest x-rays and earlier chest CT. Small adjacent LEFT pleural effusion is stable. RIGHT lung remains clear. Heart size and mediastinal contours are stable. IMPRESSION: 1. Lucent abscess cavity within the LEFT lower lung. Associated air-fluid levels within the abscess cavity were seen on previous exams. No air-fluid level appreciated within the cavity on today's exam, possibly positional in nature as indicated on yesterday's chest x-ray report, possibly indicating interval improvement. 2. Small adjacent LEFT pleural effusion appears stable. 3. No new lung findings. Electronically Signed   By: Bary Richard M.D.   On: 06/27/2017 07:13   Dg Chest  Port 1 View  Result Date: 06/25/2017 CLINICAL DATA:  Lung abscess EXAM: PORTABLE CHEST 1 VIEW COMPARISON:  Yesterday FINDINGS: History of lung abscess with cavity at the left base. Fluid level is not seen today, presumably positional. Stable small left effusion. The right lung is clear other than mild scarring or atelectasis at the base. Interface at the left apex is best attributed to the third rib cortex. No continuous pleural line is seen for pneumothorax. Normal heart size. IMPRESSION: Stable left lower lobe pneumonia, cavity, and small effusion. Electronically Signed   By: Marnee Spring M.D.   On: 06/25/2017 07:51   Dg Knee Complete 4 Views Right  Result Date: 06/07/2017 CLINICAL DATA:  Knee pain EXAM: RIGHT KNEE - COMPLETE 4+ VIEW COMPARISON:  None. FINDINGS: No evidence of fracture, dislocation, or joint effusion. No evidence of arthropathy or other focal bone abnormality. Soft tissues are unremarkable. IMPRESSION: Negative. Electronically Signed   By: Kennith Center M.D.   On: 06/07/2017 22:38   Dg Femur, Min 2 Views Right  Result Date: 06/07/2017 CLINICAL DATA:  Pain. EXAM: RIGHT FEMUR 2 VIEWS COMPARISON:  None. FINDINGS: Marked loss of joint space with subchondral sclerosis and subchondral cyst formation noted in the acetabulum and femoral head. Bones are demineralized. No evidence for femur fracture. No suspicious lytic or sclerotic osseous abnormality within the femur. 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 Electronically Signed   By: Kennith Center M.D.   On: 06/07/2017 20:35   [place] Ekg Site Rite  Result Date: 06/29/2017 If Site Rite image not attached, placement could not be confirmed due to current cardiac rhythm.    LOS: 8 days   Signature  Susa Raring M.D on 06/29/2017 at 10:15 AM  Between 7am to 7pm - Pager - [phone] ( page via [email or web address], text pages only, please mention full 10 digit call back number).  After 7pm go to [email or web address] -  password [facility]

## 2017-06-29 NOTE — Progress Notes (Signed)
Pt called RN to room. States that he is having an emergency with his wife and needs to go down to his car to hand something to her that belongs to her. Informed the CN that security will escort pt to car while RN waits at main entrance for pt to return. Pt returned with no problems.

## 2017-06-29 NOTE — Care Management Important Message (Signed)
Important Message  Patient Details  Name: [name] Hillyard MRN: [id number] Date of Birth: 08/18/1965   Medicare Important Message Given:  Yes    Caren MacadamFuller, Kayce Betty 06/29/2017, 1:01 PMImportant Message  Patient Details  Name: [name] Lory MRN: [id number] Date of Birth: 02/15/1966   Medicare Important Message Given:  Yes    Caren MacadamFuller, Kayven Aldaco 06/29/2017, 1:01 PM

## 2017-06-29 NOTE — Plan of Care (Signed)
  Problem: Clinical Measurements: Goal: Ability to maintain a body temperature in the normal range will improve Outcome: Progressing   Problem: Respiratory: Goal: Ability to maintain adequate ventilation will improve Outcome: Progressing Goal: Ability to maintain a clear airway will improve Outcome: Progressing   Problem: Clinical Measurements: Goal: Ability to maintain clinical measurements within normal limits will improve Outcome: Progressing Goal: Will remain free from infection Outcome: Progressing Goal: Diagnostic test results will improve Outcome: Progressing Goal: Cardiovascular complication will be avoided Outcome: Progressing   Problem: Clinical Measurements: Goal: Ability to maintain a body temperature in the normal range will improve Outcome: Progressing   Problem: Respiratory: Goal: Ability to maintain adequate ventilation will improve Outcome: Progressing Goal: Ability to maintain a clear airway will improve Outcome: Progressing   Problem: Clinical Measurements: Goal: Ability to maintain clinical measurements within normal limits will improve Outcome: Progressing Goal: Will remain free from infection Outcome: Progressing Goal: Diagnostic test results will improve Outcome: Progressing Goal: Cardiovascular complication will be avoided Outcome: Progressing

## 2017-06-30 DIAGNOSIS — Z882 Allergy status to sulfonamides status: Secondary | ICD-10-CM

## 2017-06-30 DIAGNOSIS — Z885 Allergy status to narcotic agent status: Secondary | ICD-10-CM

## 2017-06-30 DIAGNOSIS — F17220 Nicotine dependence, chewing tobacco, uncomplicated: Secondary | ICD-10-CM

## 2017-06-30 DIAGNOSIS — Z21 Asymptomatic human immunodeficiency virus [HIV] infection status: Secondary | ICD-10-CM

## 2017-06-30 LAB — CBC
HCT: 33.8 % — ABNORMAL LOW (ref 39.0–52.0)
Hemoglobin: 10.5 g/dL — ABNORMAL LOW (ref 13.0–17.0)
MCH: 25.8 pg — AB (ref 26.0–34.0)
MCHC: 31.1 g/dL (ref 30.0–36.0)
MCV: 83 fL (ref 78.0–100.0)
Platelets: 543 10*3/uL — ABNORMAL HIGH (ref 150–400)
RBC: 4.07 MIL/uL — ABNORMAL LOW (ref 4.22–5.81)
RDW: 16.4 % — ABNORMAL HIGH (ref 11.5–15.5)
WBC: 5.8 10*3/uL (ref 4.0–10.5)

## 2017-06-30 LAB — STREP PNEUMONIAE URINARY ANTIGEN: STREP PNEUMO URINARY ANTIGEN: POSITIVE — AB

## 2017-06-30 LAB — HIV-1 RNA QUANT-NO REFLEX-BLD
HIV 1 RNA QUANT: 90 {copies}/mL
LOG10 HIV-1 RNA: 1.954 log10copy/mL

## 2017-06-30 MED ORDER — DM-GUAIFENESIN ER 30-600 MG PO TB12: 1.0000 | ORAL_TABLET | Freq: Two times a day (BID) | ORAL | 0 refills | Status: DC

## 2017-06-30 MED ORDER — AMOXICILLIN-POT CLAVULANATE 875-125 MG PO TABS
1.0000 | ORAL_TABLET | Freq: Two times a day (BID) | ORAL | Status: DC
  Administered 2017-06-30: 1 via ORAL
  Filled 2017-06-30: qty 1

## 2017-06-30 MED ORDER — AMOXICILLIN-POT CLAVULANATE 875-125 MG PO TABS: 1.0000 | ORAL_TABLET | Freq: Two times a day (BID) | ORAL | 1 refills | Status: DC

## 2017-06-30 NOTE — Progress Notes (Addendum)
PROGRESS NOTE        PATIENT DETAILS Name: [name] Age: 52 y.o. Sex: male Date of Birth: 01/18/1966 Admit Date: 06/21/2017 Admitting Physician Renae Fickle, MD HCW:[id number], No Pcp Per  Brief Narrative: Patient is a 52 y.o. male prior history of HIV (CD4 count of 280 on 06/07/17)- recent hospitalization from 3/31-4/4 due to sepsis-pneumonia-pneumococcal bacteremia-sent home on oral amoxicillin-presented to the hospital with worsening cough, shortness of breath-found to have a lung abscess-and admitted to the hospitalist service.  See below for further details  Subjective:   Note patient had an argument with his wife last night and this morning initially signed out AMA and wanted to be discharged on oral antibiotics and refused PICC line, says wife has kicked him out of the house, thereafter decided to stay get a PICC line and go to SNF.  Patient in bed, appears comfortable, denies any headache, no fever, no chest pain or pressure, no shortness of breath , no abdominal pain. No focal weakness.  Assessment/Plan:  Left lung abscess, recent pansensitive Streptococcal Pneumonae pneumonia and bacteremia a few weeks ago: Overall much better clinically and symptomatically is on empiric IV Vancomycin and Unasyn, thoracentesis was attempted on 06/23/2017 with minimal fluid extraction hence this was not pursued further.    He has been seen by pulmonary here, refused to see ID initially but now agreeable will call ID for a consult.  Repeat CT scan on 06/29/2017 shows that the left lower lobe abscess has improved in size, he is clinically much better as well, no fever or leukocytosis, appears nontoxic.  Plan is to place PICC line thereafter will transition him to IV Unasyn for 4 weeks with 2-week appointment with ID and pulmonary for repeat imaging and deciding on full course of treatment.  Will check strep pneumonia antigen, sputum cultures pending, blood cultures  negative so far, noted that his CD4 counts are low, pending HIV viral load,  per patient he is very compliant with his HIV medications for the last 20 years.  But he refused ID consult for the longest time but today he agreed.    Minimally elevated troponin: Trend is flat-not consistent with ACS-recent echocardiogram showed preserved EF.  No further workup required.  One episode of hematochezia: Occurred on 4/9-none since then.  Suspect hemorrhoids.  Refused Anusol.  HIV: He is HIV in good control for the last 20 years and he has been compliant with his medications, refused to see ID here, was counseled. Asthma: Stable continue as needed bronchodilators and oxygen as needed.  Iron deficiency anemia: Iron supplementation-repeat anemia panel in 1 month.  Defer endoscopic evaluation if not done to the outpatient setting.    Lung nodules: Chest CT in 3 months with outpatient pulmonary follow-up.  GERD: On PPI continue    DVT Prophylaxis: Prophylactic Lovenox   Code Status: Full code   Family Communication: None at bedside  Disposition Plan: She will work looking for SNF placement for PICC line and IV antibiotics  Antimicrobial agents: Anti-infectives (From admission, onward)   Start     Dose/Rate Route Frequency Ordered Stop   06/30/17 0000  amoxicillin-clavulanate (AUGMENTIN) 875-125 MG tablet     1 tablet Oral 2 times daily 06/30/17 0938     06/21/17 1700  Darunavir-Cobicisctat-Emtricitabine-Tenofovir Alafenamide (SYMTUZA) 800-150-200-10 MG TABS 1 tablet     1 tablet Oral  Daily with breakfast 06/21/17 1347     06/21/17 1700  dolutegravir (TIVICAY) tablet 50 mg     50 mg Oral Daily 06/21/17 1347     06/21/17 1700  Ampicillin-Sulbactam (UNASYN) 3 g in sodium chloride 0.9 % 100 mL IVPB     3 g 200 mL/hr over 30 Minutes Intravenous Every 6 hours 06/21/17 1417     06/21/17 1700  vancomycin (VANCOCIN) 1,250 mg in sodium chloride 0.9 % 250 mL IVPB  Status:  Discontinued     1,250  mg 166.7 mL/hr over 90 Minutes Intravenous Every 12 hours 06/21/17 1417 06/29/17 1013   06/21/17 1600  fluconazole (DIFLUCAN) tablet 200 mg     200 mg Oral Daily 06/21/17 1347     06/21/17 1600  valACYclovir (VALTREX) tablet 500 mg     500 mg Oral Daily 06/21/17 1347     06/21/17 1015  vancomycin (VANCOCIN) IVPB 1000 mg/200 mL premix     1,000 mg 200 mL/hr over 60 Minutes Intravenous  Once 06/21/17 1004 06/21/17 1233   06/21/17 1015  piperacillin-tazobactam (ZOSYN) IVPB 3.375 g     3.375 g 100 mL/hr over 30 Minutes Intravenous  Once 06/21/17 1004 06/21/17 1233      Procedures: None  CONSULTS:  pulmonary/intensive care, ID  Time spent: 25- minutes-Greater than 50% of this time was spent in counseling, explanation of diagnosis, planning of further management, and coordination of care.  MEDICATIONS: Scheduled Meds: . aspirin EC  325 mg Oral Daily  . Darunavir-Cobicisctat-Emtricitabine-Tenofovir Alafenamide  1 tablet Oral Q breakfast  . dextromethorphan-guaiFENesin  1 tablet Oral BID  . dolutegravir  50 mg Oral Daily  . enoxaparin (LOVENOX) injection  40 mg Subcutaneous Q24H  . ferrous sulfate  325 mg Oral Q breakfast  . fluconazole  200 mg Oral Daily  . gabapentin  100 mg Oral TID  . lactose free nutrition  237 mL Oral TID WC  . magnesium oxide  400 mg Oral BID  . valACYclovir  500 mg Oral Daily   Continuous Infusions: . ampicillin-sulbactam (UNASYN) IV 3 g (06/30/17 0607)   PRN Meds:.acetaminophen **OR** acetaminophen, albuterol, benzonatate, bisacodyl, chlorpheniramine-HYDROcodone, guaiFENesin-dextromethorphan, ibuprofen, nitroGLYCERIN, ondansetron **OR** ondansetron (ZOFRAN) IV, polyethylene glycol, zolpidem   PHYSICAL EXAM: Vital signs: Vitals:   06/29/17 0454 06/29/17 1404 06/29/17 2018 06/30/17 0417  BP: 108/73 117/72 121/80 105/73  Pulse: 67 65 70 62  Resp: 18 18 16 16   Temp: 98 F (36.7 C) 98.1 F (36.7 C) 98.1 F (36.7 C) 98.1 F (36.7 C)  TempSrc: Oral  Oral Oral Oral  SpO2: 96% 96% 98% 95%  Weight:      Height:       Filed Weights   06/21/17 0626 06/21/17 1532  Weight: 88 kg (194 lb) 83.9 kg (185 lb)   Body mass index is 23.75 kg/m.   Exam   Awake Alert, Oriented X 3, No new F.N deficits, Normal affect [place].AT,PERRAL Supple Neck,No JVD, No cervical lymphadenopathy appriciated.  Symmetrical Chest wall movement, Good air movement bilaterally, few LLL rales RRR,No Gallops, Rubs or new Murmurs, No Parasternal Heave +ve B.Sounds, Abd Soft, No tenderness, No organomegaly appriciated, No rebound - guarding or rigidity. No Cyanosis, Clubbing or edema, No new Rash or bruise    I have personally reviewed following labs and imaging studies  LABORATORY DATA: CBC: Recent Labs  Lab 06/24/17 0447 06/25/17 0443 06/27/17 0438 06/29/17 0454 06/30/17 0429  WBC 6.1 7.1 6.3 6.6 5.8  HGB 8.6* 9.1* 10.4* 10.5*  10.5*  HCT 26.8* 28.8* 33.5* 33.2* 33.8*  MCV 79.5 81.1 82.5 82.0 83.0  PLT 510* 605* 675* 583* 543*    Basic Metabolic Panel: Recent Labs  Lab 06/24/17 0447 06/25/17 0443 06/27/17 0438 06/29/17 0454  NA 140 138 139 138  K 3.9 4.2 4.2 4.0  CL 112* 109 103 103  CO2 19* 20* 26 24  GLUCOSE 100* 91 93 115*  BUN 13 9 11 11   CREATININE 0.89 0.90 1.01 1.05  CALCIUM 8.4* 8.5* 8.9 9.0    GFR: Estimated Creatinine Clearance: 97.9 mL/min (by C-G formula based on SCr of 1.05 mg/dL).  Liver Function Tests: No results for input(s): AST, ALT, ALKPHOS, BILITOT, PROT, ALBUMIN in the last 168 hours. No results for input(s): LIPASE, AMYLASE in the last 168 hours. No results for input(s): AMMONIA in the last 168 hours.  Coagulation Profile: Recent Labs  Lab 06/29/17 0454  INR 1.13    Cardiac Enzymes: No results for input(s): CKTOTAL, CKMB, CKMBINDEX, TROPONINI in the last 168 hours.  BNP (last 3 results) No results for input(s): PROBNP in the last 8760 hours.  HbA1C: No results for input(s): HGBA1C in the last 72  hours.  CBG: No results for input(s): GLUCAP in the last 168 hours.  Lipid Profile: No results for input(s): CHOL, HDL, LDLCALC, TRIG, CHOLHDL, LDLDIRECT in the last 72 hours.  Thyroid Function Tests: No results for input(s): TSH, T4TOTAL, FREET4, T3FREE, THYROIDAB in the last 72 hours.  Anemia Panel: No results for input(s): VITAMINB12, FOLATE, FERRITIN, TIBC, IRON, RETICCTPCT in the last 72 hours.  Urine analysis: No results found for: COLORURINE, APPEARANCEUR, LABSPEC, PHURINE, GLUCOSEU, HGBUR, BILIRUBINUR, KETONESUR, PROTEINUR, UROBILINOGEN, NITRITE, LEUKOCYTESUR  Sepsis Labs: Lactic Acid, Venous    Component Value Date/Time   LATICACIDVEN 0.85 06/14/2017 1301    MICROBIOLOGY: Recent Results (from the past 240 hour(s))  Blood culture (routine x 2)     Status: None   Collection Time: 06/21/17  7:49 AM  Result Value Ref Range Status   Specimen Description   Final    BLOOD RIGHT ARM Performed at [facility], [address] [place], [place] [address]    Special Requests   Final    BOTTLES DRAWN AEROBIC AND ANAEROBIC Blood Culture results may not be optimal due to an excessive volume of blood received in culture bottles Performed at [facility], [address] [place], [place] [address]    Culture   Final    NO GROWTH 5 DAYS Performed at [facility], [address] [place], [place] [address]    Report Status 06/26/2017 FINAL  Final  Blood culture (routine x 2)     Status: None   Collection Time: 06/21/17  9:31 AM  Result Value Ref Range Status   Specimen Description   Final    BLOOD RIGHT ANTECUBITAL Performed at [facility], [address], [place], [place] [address]    Special Requests   Final    BOTTLES DRAWN AEROBIC AND ANAEROBIC Blood Culture results may not be optimal due to an excessive volume of blood received in culture bottles Performed at [facility], [address] [place], [place]  [address]    Culture   Final    NO GROWTH 5 DAYS Performed at [facility], [address], [place], [place] [address]    Report Status 06/26/2017 FINAL  Final  MRSA PCR Screening     Status: None  Collection Time: 06/22/17  3:46 AM  Result Value Ref Range Status   MRSA by PCR NEGATIVE NEGATIVE Final    Comment:        The GeneXpert MRSA Assay (FDA approved for NASAL specimens only), is one component of a comprehensive MRSA colonization surveillance program. It is not intended to diagnose MRSA infection nor to guide or monitor treatment for MRSA infections. Performed at [facility], [address] [place], [place] [address]   Culture, expectorated sputum-assessment     Status: None   Collection Time: 06/28/17  2:41 PM  Result Value Ref Range Status   Specimen Description SPUTUM  Final   Special Requests NONE  Final   Sputum evaluation   Final    THIS SPECIMEN IS ACCEPTABLE FOR SPUTUM CULTURE Performed at [facility], [address] [place], [place] [address]    Report Status 06/28/2017 FINAL  Final  Culture, respiratory (NON-Expectorated)     Status: None (Preliminary result)   Collection Time: 06/28/17  2:41 PM  Result Value Ref Range Status   Specimen Description   Final    SPUTUM Performed at [facility], [address] [place], [place] [address]    Special Requests   Final    NONE Reflexed from [id number] Performed at [facility], [address] [place], [place] [address]    Gram Stain NO WBC SEEN RARE GRAM POSITIVE COCCI   Final   Culture   Final    CULTURE REINCUBATED FOR BETTER GROWTH Performed at [facility], [address], [place], [place] [address]    Report Status PENDING  Incomplete    RADIOLOGY STUDIES/RESULTS: Dg Chest 2 View  Result Date: 06/24/2017 CLINICAL DATA:  Productive cough, shortness of breath, HIV, previous episode of pneumonia. EXAM: CHEST - 2  VIEW COMPARISON:  Chest x-ray of April 7th 2019 and chest CT scan of the same day FINDINGS: The right lung is clear. There is an air-fluid level in a cavitary mass at the right lung base. This is better organized than on the previous study. The heart and pulmonary vascularity are normal. The mediastinum is normal in width. There is no pneumothorax. The bony thorax is unremarkable. IMPRESSION: Right lower lobe lung abscess with an air-fluid level. This is slightly better organized today. Electronically Signed   By: David  [place] M.D.   On: 06/24/2017 11:13   Dg Chest 2 View  Result Date: 06/21/2017 CLINICAL DATA:  Pneumonia.  Shortness of breath. EXAM: CHEST - 2 VIEW COMPARISON:  June 16, 2017 FINDINGS: The infiltrate in the left lower lobe appears mildly worsened in the interval. Mild opacity in the medial right base could represent subtle infiltrate versus vascular crowding. No pneumothorax. The heart, hila, and mediastinum are normal. Air-fluid levels are seen behind the heart on the lateral view. No hiatal hernia was seen on the CT scan from June 14, 2017. IMPRESSION: 1. The infiltrate in the left base appears mildly worsened in the interval. Vascular crowding versus subtle infiltrate in the medial right base. 2. An air-fluid level is projected behind the heart on the lateral view is identified. No hiatal hernia was identified on the CT scan from June 14, 2017 an air-fluid level could be associated with the infiltrate. Recommend a CT scan for better evaluation. Electronically Signed   By: Gerome Sam III M.D   On: 06/21/2017 07:56   Dg Chest 2 View  Result Date: 06/16/2017 CLINICAL DATA:  Chest pain, shortness of breath, history of HIV EXAM: CHEST - 2 VIEW COMPARISON:  CT chest of 06/14/2017 and chest x-ray of the same day FINDINGS: There is little change in dense consolidation of the left lower lobe posteriorly consistent with left lower lobe pneumonia. Otherwise the lungs are clear. Mediastinal and  hilar contours are stable. The heart is within normal limits in size. No acute bony abnormality is seen. IMPRESSION: No change in dense consolidation of the left lower lobe consistent with pneumonia. Electronically Signed   By: Dwyane Dee M.D.   On: 06/16/2017 08:10   Dg Chest 2 View  Result Date: 06/14/2017 CLINICAL DATA:  Cough, chest pain and shortness of breath for 4 days. EXAM: CHEST - 2 VIEW COMPARISON:  09/19/2009 and prior chest radiographs FINDINGS: Airspace disease/consolidation involving much of the LEFT LOWER lobe identified likely representing pneumonia. Mild ill-defined opacities within the mid and lower RIGHT lung may represent developing airspace disease/pneumonia. Cardiomediastinal silhouette is otherwise unchanged. There may be a trace LEFT pleural effusion present. There is no evidence of pneumothorax or acute bony abnormality. IMPRESSION: Airspace disease/consolidation involving the majority of the LEFT LOWER lobe likely representing pneumonia. Mild opacities within the mid and lower RIGHT lung which may represent developing airspace disease/pneumonia. Radiographic follow-up to resolution is recommended. Electronically Signed   By: Harmon Pier M.D.   On: 06/14/2017 07:42   Ct Chest W Contrast  Result Date: 06/29/2017 CLINICAL DATA:  Lung abscess EXAM: CT CHEST WITH CONTRAST TECHNIQUE: Multidetector CT imaging of the chest was performed during intravenous contrast administration. CONTRAST:  75mL OMNIPAQUE IOHEXOL 300 MG/ML  SOLN COMPARISON:  Chest CT June 21, 2017 and chest radiograph June 27, 2017 FINDINGS: Cardiovascular: There is no appreciable thoracic aortic aneurysm or dissection. The visualized great vessels appear normal. No major vessel pulmonary embolus. No pericardial effusion or pericardial thickening. There is prominence of the main pulmonary outflow tract measuring 3.3 cm in diameter. Mediastinum/Nodes: Thyroid appears normal. Scattered subcentimeter mediastinal lymph nodes  are present, similar to prior study. No adenopathy by size criteria evident. No esophageal lesions. Lungs/Pleura: In comparison with the recent CT, the abscess seen in the left lower lobe currently measures 7.2 x 5.4 x 5.1 cm, compared to a measurement of 9.5 x 7.8 x 10.3 cm. There is consolidation throughout much of the remaining left lower lobe, similar to recent study. There is a small left pleural effusion. No consolidation or abscess noted elsewhere. There is atelectatic change in the right lung base posteriorly. No edema or consolidation evident on the right. Upper Abdomen: . Spleen appears prominent although incompletely visualized. Spleen measures 13.7 cm in length from anterior to posterior dimension. Visualized upper abdominal structures otherwise appear unremarkable. Musculoskeletal: No blastic or lytic bone lesions. No evident chest wall lesions. IMPRESSION: 1. The left lower lobe lung abscess is smaller compared to prior study. There is air-fluid level as well as thickened material in the dependent portion of this abscess. There is surrounding consolidation throughout much of the left lower lobe with a small left pleural effusion, similar to prior study. 2. Right lung clear except for mild right base atelectasis posteriorly. 3. Multiple subcentimeter mediastinal lymph nodes. By size criteria, there is no evident adenopathy. 4. Prominence the main pulmonary outflow tract consistent with a degree of pulmonary arterial hypertension. 5. Spleen appears enlarged although incompletely visualized. There are multiple calcified splenic granulomas. Electronically Signed   By: Bretta Bang III M.D.   On: 06/29/2017 08:22   Ct  Chest W Contrast  Result Date: 06/21/2017 CLINICAL DATA:  Pneumonia.  HIV positive. EXAM: CT CHEST WITH CONTRAST TECHNIQUE: Multidetector CT imaging of the chest was performed during intravenous contrast administration. CONTRAST:  75mL OMNIPAQUE IOHEXOL 300 MG/ML  SOLN COMPARISON:  CT  scan June 14, 2017 FINDINGS: Cardiovascular: The thoracic aorta is nonaneurysmal with no dissection or atherosclerotic change. The heart is unchanged. The central pulmonary artery measures 3.8 cm. No obvious filling defects. Mediastinum/Nodes: Thyroid and esophagus are normal. Shotty nodes in the mediastinum without adenopathy are likely reactive. Lungs/Pleura: Central airways are normal. The patient's known left lower lobe pneumonia is again identified. There is a rounded consolidated component as seen on series 7, image 100 measuring 9.5 by 7.8 by 10.3 cm containing an air-fluid level consistent with necrotic lung/abscess. This accounts for the recent chest x-ray finding. Mild patchy ground-glass opacity in the right upper lobe is likely infectious, less focal in the interval. There is a left-sided pleural effusion which is small, extending into the left fissure. Upper Abdomen: No acute abnormality. Musculoskeletal: No chest wall abnormality. No acute or significant osseous findings. IMPRESSION: 1. Left lower lobe pneumonia. There is now a large rounded collection within the infiltrate containing air-fluid levels consistent with necrosis/abscess formation. 2. Mild patchy infectious change in the right upper lobe. 3. Reactive nodes in the mediastinum. 4. The main pulmonary artery measures 3.8 cm which is mildly dilated raising the possibility of pulmonary hypertension. 5. No other abnormalities. Electronically Signed   By: Gerome Sam III M.D   On: 06/21/2017 11:03   Ct Angio Chest Pe W And/or Wo Contrast  Result Date: 06/14/2017 CLINICAL DATA:  Acute generalized abdominal pain EXAM: CT ANGIOGRAPHY CHEST CT ABDOMEN AND PELVIS WITH CONTRAST TECHNIQUE: Multidetector CT imaging of the chest was performed using the standard protocol during bolus administration of intravenous contrast. Multiplanar CT image reconstructions and MIPs were obtained to evaluate the vascular anatomy. Multidetector CT imaging of the  abdomen and pelvis was performed using the standard protocol during bolus administration of intravenous contrast. CONTRAST:  ISOVUE-370 IOPAMIDOL (ISOVUE-370) INJECTION 76% COMPARISON:  None. FINDINGS: CTA CHEST FINDINGS Cardiovascular: Satisfactory opacification of the pulmonary arteries to the segmental level. No evidence of pulmonary embolism. Normal heart size. No pericardial effusion. Mediastinum/Nodes: Enlarged subcarinal lymph node measuring 13 mm. Mild left hilar lymphadenopathy likely reactive. No axillary lymphadenopathy. Normal trachea and esophagus. Normal thyroid gland. Lungs/Pleura: Dense left lower lobe consolidation. 13 mm right lower lobe pulmonary nodule. 13 mm right upper lobe pulmonary nodule. Hazy airspace disease in the right upper lobe. Small left pleural effusion. No pneumothorax. Musculoskeletal: No acute osseous abnormality. No aggressive osseous lesion. Review of the MIP images confirms the above findings. CT ABDOMEN and PELVIS FINDINGS Hepatobiliary: No focal liver abnormality is seen. No gallstones, gallbladder wall thickening, or biliary dilatation. Pancreas: Unremarkable. No pancreatic ductal dilatation or surrounding inflammatory changes. Spleen: Multiple calcifications in the spleen as can be seen with prior granulomatous disease. Adrenals/Urinary Tract: Adrenal glands are unremarkable. No urolithiasis or obstructive uropathy. 18 mm hypodense, fluid attenuating right interpolar renal mass most consistent with a cyst. Bladder is unremarkable. Stomach/Bowel: Small hiatal hernia. Stomach is otherwise within normal limits. No evidence of bowel wall thickening, distention, or inflammatory changes. Vascular/Lymphatic: Normal caliber abdominal aorta. Bilateral periaortic calcified lymph node as can be seen with prior granulomatous disease. Reproductive: Prostate is unremarkable. Other: No abdominal wall hernia or abnormality. No abdominopelvic ascites. Musculoskeletal: No aggressive osseous  lesion. Left total hip arthroplasty. No acute osseous  abnormality. Severe osteoarthritis of the right hip. Review of the MIP images confirms the above findings. IMPRESSION: 1. No pulmonary embolus. 2. Dense left lower lobe pneumonia. 3. Mild hazy right upper lobe airspace disease concerning for pneumonia. 4. 13 mm right upper lobe and right lower lobe pulmonary nodules which may be secondary to an inflammatory or neoplastic etiology. Recommend follow-up CT of the chest in 3 months. Electronically Signed   By: Elige Ko   On: 06/14/2017 12:41   Ct Pelvis Wo Contrast  Result Date: 06/07/2017 CLINICAL DATA:  52 year old male with right hip pain. EXAM: CT PELVIS WITHOUT CONTRAST TECHNIQUE: Multidetector CT imaging of the pelvis was performed following the standard protocol without intravenous contrast. COMPARISON:  Pelvic radiograph dated 04/13/2017 and CT of the abdomen pelvis dated 10/21/2013. FINDINGS: Urinary Tract: The visualized distal ureters and urinary bladder appear unremarkable. Bowel: No bowel dilatation or active inflammation within the pelvis. Vascular/Lymphatic: The visualized vasculature appear unremarkable. A top-normal left external iliac chain lymph node measures 1 cm in short axis, of indeterminate clinical significance, likely reactive. Reproductive: The prostate and seminal vesicles are grossly unremarkable. Other:  None Musculoskeletal: There is a total left arthroplasty. The arthroplasty components appear intact and in anatomic alignment. There is heterotopic bone formation adjacent to the left greater trochanter. The appearance of the arthroplasty and acetabular screws are similar to the prior CT. There is osteopenia of the left hip primarily adjacent to the acetabular cup, increased since the prior CT. There is severe osteoarthritis of the right hip with slight flattening of the right femoral head and narrowing of the joint space similar or slightly progressed compared to the CT of 2015.  There is no acute fracture. No dislocation. IMPRESSION: Severe osteoarthritic changes of the right hip similar or slightly progressed compared to the prior CT. Mild flattening and fragmentation of the right femoral head may be sequela of underlying avascular necrosis. Left hip arthroplasty appears intact. Progression of osteopenia surrounding the acetabular cup of the left hip. No acute fracture. Electronically Signed   By: Elgie Collard M.D.   On: 06/07/2017 22:29   [place] Chest (pleural Effusion)  Result Date: 06/23/2017 CLINICAL DATA:  52 year old male who presents for left-sided thoracentesis. EXAM: CHEST ULTRASOUND COMPARISON:  CT scan of the chest 06/21/2017 FINDINGS: Sonographic interrogation of the left pleural space demonstrates trace pleural fluid. There is insufficient fluid for safe thoracentesis. The procedure was deferred. IMPRESSION: Trace left-sided pleural effusion, insufficient for thoracentesis. Electronically Signed   By: Malachy Moan M.D.   On: 06/23/2017 17:13   Ct Abdomen Pelvis W Contrast  Result Date: 06/14/2017 CLINICAL DATA:  Acute generalized abdominal pain EXAM: CT ANGIOGRAPHY CHEST CT ABDOMEN AND PELVIS WITH CONTRAST TECHNIQUE: Multidetector CT imaging of the chest was performed using the standard protocol during bolus administration of intravenous contrast. Multiplanar CT image reconstructions and MIPs were obtained to evaluate the vascular anatomy. Multidetector CT imaging of the abdomen and pelvis was performed using the standard protocol during bolus administration of intravenous contrast. CONTRAST:  ISOVUE-370 IOPAMIDOL (ISOVUE-370) INJECTION 76% COMPARISON:  None. FINDINGS: CTA CHEST FINDINGS Cardiovascular: Satisfactory opacification of the pulmonary arteries to the segmental level. No evidence of pulmonary embolism. Normal heart size. No pericardial effusion. Mediastinum/Nodes: Enlarged subcarinal lymph node measuring 13 mm. Mild left hilar lymphadenopathy likely  reactive. No axillary lymphadenopathy. Normal trachea and esophagus. Normal thyroid gland. Lungs/Pleura: Dense left lower lobe consolidation. 13 mm right lower lobe pulmonary nodule. 13 mm right upper lobe pulmonary nodule. Hazy airspace  disease in the right upper lobe. Small left pleural effusion. No pneumothorax. Musculoskeletal: No acute osseous abnormality. No aggressive osseous lesion. Review of the MIP images confirms the above findings. CT ABDOMEN and PELVIS FINDINGS Hepatobiliary: No focal liver abnormality is seen. No gallstones, gallbladder wall thickening, or biliary dilatation. Pancreas: Unremarkable. No pancreatic ductal dilatation or surrounding inflammatory changes. Spleen: Multiple calcifications in the spleen as can be seen with prior granulomatous disease. Adrenals/Urinary Tract: Adrenal glands are unremarkable. No urolithiasis or obstructive uropathy. 18 mm hypodense, fluid attenuating right interpolar renal mass most consistent with a cyst. Bladder is unremarkable. Stomach/Bowel: Small hiatal hernia. Stomach is otherwise within normal limits. No evidence of bowel wall thickening, distention, or inflammatory changes. Vascular/Lymphatic: Normal caliber abdominal aorta. Bilateral periaortic calcified lymph node as can be seen with prior granulomatous disease. Reproductive: Prostate is unremarkable. Other: No abdominal wall hernia or abnormality. No abdominopelvic ascites. Musculoskeletal: No aggressive osseous lesion. Left total hip arthroplasty. No acute osseous abnormality. Severe osteoarthritis of the right hip. Review of the MIP images confirms the above findings. IMPRESSION: 1. No pulmonary embolus. 2. Dense left lower lobe pneumonia. 3. Mild hazy right upper lobe airspace disease concerning for pneumonia. 4. 13 mm right upper lobe and right lower lobe pulmonary nodules which may be secondary to an inflammatory or neoplastic etiology. Recommend follow-up CT of the chest in 3 months.  Electronically Signed   By: Elige Ko   On: 06/14/2017 12:41   Dg Chest Port 1 View  Result Date: 06/27/2017 CLINICAL DATA:  History of lung abscess. EXAM: PORTABLE CHEST 1 VIEW COMPARISON:  Chest x-rays dated 06/25/2017 and 06/24/2017. Chest CT dated 06/21/2017. FINDINGS: Lucent abscess cavity is seen over the LEFT heart border, corresponding to the abscess collection demonstrated on CT of 06/21/2017, but without convincing air-fluid level as seen on the recent chest x-rays and earlier chest CT. Small adjacent LEFT pleural effusion is stable. RIGHT lung remains clear. Heart size and mediastinal contours are stable. IMPRESSION: 1. Lucent abscess cavity within the LEFT lower lung. Associated air-fluid levels within the abscess cavity were seen on previous exams. No air-fluid level appreciated within the cavity on today's exam, possibly positional in nature as indicated on yesterday's chest x-ray report, possibly indicating interval improvement. 2. Small adjacent LEFT pleural effusion appears stable. 3. No new lung findings. Electronically Signed   By: Bary Richard M.D.   On: 06/27/2017 07:13   Dg Chest Port 1 View  Result Date: 06/25/2017 CLINICAL DATA:  Lung abscess EXAM: PORTABLE CHEST 1 VIEW COMPARISON:  Yesterday FINDINGS: History of lung abscess with cavity at the left base. Fluid level is not seen today, presumably positional. Stable small left effusion. The right lung is clear other than mild scarring or atelectasis at the base. Interface at the left apex is best attributed to the third rib cortex. No continuous pleural line is seen for pneumothorax. Normal heart size. IMPRESSION: Stable left lower lobe pneumonia, cavity, and small effusion. Electronically Signed   By: Marnee Spring M.D.   On: 06/25/2017 07:51   Dg Knee Complete 4 Views Right  Result Date: 06/07/2017 CLINICAL DATA:  Knee pain EXAM: RIGHT KNEE - COMPLETE 4+ VIEW COMPARISON:  None. FINDINGS: No evidence of fracture,  dislocation, or joint effusion. No evidence of arthropathy or other focal bone abnormality. Soft tissues are unremarkable. IMPRESSION: Negative. Electronically Signed   By: Kennith Center M.D.   On: 06/07/2017 22:38   Dg Femur, Min 2 Views Right  Result Date: 06/07/2017  CLINICAL DATA:  Pain. EXAM: RIGHT FEMUR 2 VIEWS COMPARISON:  None. FINDINGS: Marked loss of joint space with subchondral sclerosis and subchondral cyst formation noted in the acetabulum and femoral head. Bones are demineralized. No evidence for femur fracture. No suspicious lytic or sclerotic osseous abnormality within the femur. 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 Electronically Signed   By: Kennith CenterEric  Mansell M.D.   On: 06/07/2017 20:35   [place] Ekg Site Rite  Result Date: 06/29/2017 If Site Rite image not attached, placement could not be confirmed due to current cardiac rhythm.    LOS: 9 days   Signature  Susa RaringPrashant Tobyn Osgood M.D on 06/30/2017 at 11:57 AM  Between 7am to 7pm - Pager - [phone] ( page via [email or web address], text pages only, please mention full 10 digit call back number).  After 7pm go to [email or web address] - password [email or web address]

## 2017-06-30 NOTE — Progress Notes (Signed)
Pharmacy Antibiotic Note  [name] is a 52 y.o. male with hx HIV and recently hospitalized for strep pneumococcal bacteremia/PNA.  He was discharged on 06/18/17 with 12 day course of augmentin. He presented back to the ED on 06/21/2017 with c/o SOB.  Unasyn for PNA/lung abscess.  06/30/2017:  D#10 abx, Afebrile, Scr stable, WBC WNL Repeat CT scan 4/15. LLL abscess improved. Also clinically much better. Plan: PICC line. IV unasyn x 3 weeks w/ f/u w/ ID and pulm for repeat imaging  Plan:  Continue unasyn 3gm IV q6h  Monitor renal function and cx data   Noted plan for 3 weeks IV antibiotics.   ___________________________________  Height: 6\' 2"  (188 cm) Weight: 185 lb (83.9 kg) IBW/kg (Calculated) : 82.2  Temp (24hrs), Avg:98.1 F (36.7 C), Min:98.1 F (36.7 C), Max:98.1 F (36.7 C)  Recent Labs  Lab 06/24/17 0447 06/24/17 1719 06/24/17 2031 06/25/17 0443 06/27/17 0438 06/29/17 0454 06/30/17 0429  WBC 6.1  --   --  7.1 6.3 6.6 5.8  CREATININE 0.89  --   --  0.90 1.01 1.05  --   VANCOTROUGH  --  14*  --   --   --   --   --   VANCOPEAK  --   --  22*  --   --   --   --     Estimated Creatinine Clearance: 97.9 mL/min (by C-G formula based on SCr of 1.05 mg/dL).    Allergies  Allergen Reactions  . Sulfa Antibiotics Rash  . Tramadol Nausea Only  Antimicrobials this admission:  4/7 zosyn x1 4/7 vanc>>4/15 4/7 unasyn>>  Dose adjustments this admission:  4/10 1719 = 14 mcg/mL (vanco 1250mg  IV q12h - dose at 17:54) 4/10 2030 pk = 22 mcg/mL - calculates a Ke = 0.0514, Pk = 23.3, tr = 13.5 and AUC = 433.7  Microbiology results:  3/31 bcx x2: 2/2 strep pneum (pans sens) 4/2 MRSA PCR: neg 4/7 flu neg 4/8 MRSA PCR neg 4/7 BCx x2: ngF 4/14 sputum rare gm pos coci 4/15 HIV 1 RNA>ip 4/15 CD4 T cells 310, CD4% helper 14. low 4/16 strep pneumo Uag>ip  Thank you for allowing pharmacy to be a part of this patient's care.  Herby AbrahamMichelle T. Maelynn Moroney, Pharm.D. [phone] 06/30/2017  1:00 PM

## 2017-06-30 NOTE — Discharge Instructions (Signed)
Following the recommended ID and pulmonary physician within a week of discharge.  You are leaving AGAINST MEDICAL ADVICE and fully understand that by doing so he was subjecting himself to be at risk for death, disability, stroke.  You understand this and resume full responsibility of her actions.

## 2017-06-30 NOTE — Clinical Social Work Note (Signed)
Clinical Social Work Assessment  Patient Details  Name: [name] MRN: [id number] Date of Birth: 11/14/1965  Date of referral:  06/30/17               Reason for consult:  Housing Concerns/Homelessness, Discharge Planning                Permission sought to share information with:  [employer] Contact Representative Permission granted to share information::  Yes, Verbal Permission Granted  Name::        Agency::     Relationship::     Contact Information:      Housing/Transportation Living arrangements for the past 2 months:  Mobile Home  Source of Information:  Patient Patient Interpreter Needed:  None Criminal Activity/Legal Involvement Pertinent to Current Situation/Hospitalization:  No - Comment as needed Significant Relationships:  Spouse Lives with:  Spouse(Patient's spouse recently kicked patient out of there shared home) Do you feel safe going back to the place where you live?  No Need for family participation in patient care:  No (Coment)  Care giving concerns:  Patient recently kicked out of shared home by wife. Patient needs IV antibiotics at discharge. Patient has no home to receive IV antibiotics at. Patient reported that his income is his disability and that he has medicare and medicaid coverage.    Social Worker assessment / plan:  CSW spoke with patient at bedside regarding discharge planning and concerns. Patient reported that he was recently kicked out of his home and has no where to go. Patient reported that he has mother and children locally but that he is not able to stay with them. Patient reported that he needs IV antibiotics at discharge but has nowhere to receive them. Patient reported that he is open to SNF at dc to get IV antibiotics. CSW explained SNF placement process and medicare coverage, patient verbalized understanding. Patient reported that he has been to SNF in the past. CSW agreed to update patient's attending MD and start SNF placement process.    CSW updated patient's attending MD.  CSW completed FL2 and will follow up with bed offers.  CSW will continue to follow and assist with discharge planning.  Employment status:  Disabled (Comment on whether or not currently receiving Disability) Insurance information:    PT Recommendations:  Not assessed at this time Information / Referral to community resources:  Skilled Nursing Facility  Patient/Family's Response to care:  Patient appreciative of CSW assistance with discharge planning.   Patient/Family's Understanding of and Emotional Response to Diagnosis, Current Treatment, and Prognosis:  Patient presented calm and became tearful when discussing his relationship with his wife. CSW and patient spoke about patient's relationship with his wife of 15 years and recent ending. Patient reported that he doesn't think wife can handle patient being in and out the hospital and stated "I think she's done". CSW provided emotional support. Patient verbalized plan to dc to SNF to complete IV antibiotics and then he would use his check to pursue new housing.   Emotional Assessment Appearance:  Appears stated age Attitude/Demeanor/Rapport:  Other(Open) Affect (typically observed):  Calm, Tearful/Crying Orientation:  Oriented to Self, Oriented to Place, Oriented to  Time, Oriented to Situation Alcohol / Substance use:  Not Applicable Psych involvement (Current and /or in the community):  No (Comment)  Discharge Needs  Concerns to be addressed:  Care Coordination Readmission within the last 30 days:  Yes Current discharge risk:  Homeless Barriers to Discharge:  Continued Medical  Work up   [employer], LCSW 06/30/2017, 2:21 PM

## 2017-06-30 NOTE — Discharge Summary (Addendum)
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 sclerosis and subchondral cyst formation noted in the acetabulum and femoral head. Bones are demineralized. No evidence for femur fracture. No suspicious lytic or sclerotic osseous abnormality within the femur. 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 Electronically Signed   By: Kennith Center M.D.   On: 06/07/2017 20:35   [place] Ekg Site Rite  Result Date: 06/29/2017 If Site Rite image not attached, placement could not be confirmed due to current cardiac rhythm.   Micro Results  Recent Results (from the past 240 hour(s))  Blood culture (routine x 2)     Status: None   Collection Time: 06/21/17  7:49 AM  Result Value Ref Range Status   Specimen Description   Final    BLOOD RIGHT ARM Performed at [facility], [address] [place], [place] [address]    Special Requests   Final    BOTTLES DRAWN AEROBIC AND ANAEROBIC Blood Culture results may not be optimal due to an excessive volume  of blood received in  culture bottles Performed at [facility], [address] [place], [place] [address]    Culture   Final    NO GROWTH 5 DAYS Performed at [facility], [address], [place], [place] [address]    Report Status 06/26/2017 FINAL  Final  Blood culture (routine x 2)     Status: None   Collection Time: 06/21/17  9:31 AM  Result Value Ref Range Status   Specimen Description   Final    BLOOD RIGHT ANTECUBITAL Performed at [facility], [address] [place], [place] [address]    Special Requests   Final    BOTTLES DRAWN AEROBIC AND ANAEROBIC Blood Culture results may not be optimal due to an excessive volume of blood received in culture bottles Performed at [facility], [address], [place], [place] [address]    Culture   Final    NO GROWTH 5 DAYS Performed at [facility], [address], [place], [place] [address]    Report Status 06/26/2017 FINAL  Final  MRSA PCR Screening     Status: None   Collection Time: 06/22/17  3:46 AM  Result Value Ref Range Status   MRSA by PCR NEGATIVE NEGATIVE Final    Comment:        The GeneXpert MRSA Assay (FDA approved for NASAL specimens only), is one component of a comprehensive MRSA colonization surveillance program. It is not intended to diagnose MRSA infection nor to guide or monitor treatment for MRSA infections. Performed at [facility], [address], [place], [place] [address]   Culture, expectorated sputum-assessment     Status: None   Collection Time: 06/28/17  2:41 PM  Result Value Ref Range Status   Specimen Description SPUTUM  Final   Special Requests NONE  Final   Sputum evaluation   Final    THIS SPECIMEN IS ACCEPTABLE FOR SPUTUM CULTURE Performed at [facility], [address], [place], [place] [address]    Report Status 06/28/2017 FINAL  Final  Culture, respiratory (NON-Expectorated)     Status: None (Preliminary  result)   Collection Time: 06/28/17  2:41 PM  Result Value Ref Range Status   Specimen Description   Final    SPUTUM Performed at [facility], [address], [place], [place] [address]    Special Requests   Final    NONE Reflexed from [id number] Performed at [facility], [address] [place], [place] [address]    Gram Stain NO WBC SEEN RARE GRAM POSITIVE COCCI   Final   Culture   Final    CULTURE REINCUBATED FOR BETTER GROWTH Performed at [facility], [address] [place], [place] [address]    Report Status PENDING  Incomplete    Today   Subjective    Dontreal Miera today has no headache,no chest abdominal pain,no new weakness tingling or numbness, feels much better wants to go home today.    Objective   Blood pressure 105/73, pulse 62, temperature 98.1 F (36.7 C), temperature source Oral, resp. rate 16, height 6\' 2"  (1.88 m), weight 83.9 kg (185 lb), SpO2 95 %.   Intake/Output Summary (Last 24 hours) at 06/30/2017 1448 Last data filed at 06/30/2017 0003 Gross per 24 hour  Intake 920 ml  Output -  Net 920 ml    Exam Awake Alert, Oriented x 3, No new  F.N deficits, Normal affect [place].AT,PERRAL Supple Neck,No JVD, No cervical lymphadenopathy appriciated.  Symmetrical Chest wall movement, Good air movement bilaterally, CTAB RRR,No Gallops,Rubs or new Murmurs, No Parasternal Heave +ve B.Sounds, Abd Soft, Non tender, No organomegaly appriciated, No rebound -guarding or rigidity. No Cyanosis, Clubbing or edema, No new Rash or bruise   Data Review   CBC w Diff:  Lab Results  Component Value Date   WBC 5.8 06/30/2017   HGB 10.5 (L) 06/30/2017   HCT 33.8 (L) 06/30/2017   PLT 543 (H) 06/30/2017   LYMPHOPCT 7 06/14/2017   MONOPCT 7 06/14/2017   EOSPCT 2 06/14/2017   BASOPCT 0 06/14/2017    CMP:  Lab Results  Component Value Date   NA 138 06/29/2017   K 4.0 06/29/2017   CL 103 06/29/2017   CO2 24 06/29/2017    BUN 11 06/29/2017   CREATININE 1.05 06/29/2017   CREATININE 1.12 05/04/2017   PROT 9.3 (H) 06/21/2017   ALBUMIN 3.0 (L) 06/21/2017   BILITOT 0.6 06/21/2017   ALKPHOS 120 06/21/2017   AST 20 06/21/2017   ALT 27 06/21/2017  .   Total Time in preparing paper work, data evaluation and todays exam - 35 minutes  Susa Raring M.D on 06/30/2017 at 2:48 PM  [facility]   Office  [phone]

## 2017-06-30 NOTE — Progress Notes (Signed)
Pt discharged from the unit. Discharge instructions were reviewed with the pt. Resources provided for shelters and aides in the area. No questions or concerns at this time.

## 2017-06-30 NOTE — Progress Notes (Signed)
CSW informed by patient's RN that patient has decided to discharge home. Per chart review, patient refused IV antibiotics and plans to dc home with oral medication. CSW signing off, no other needs identified at this time.   Celso SickleKimberly Libi Corso, [place] Clinical Social Worker [facility] Cell#: [phone]

## 2017-06-30 NOTE — Progress Notes (Signed)
After discussing PICC placement with risk and benefit, patient has decided that he does not want a PICC line placed and wants a IV to receive  his medications. Primary RN made aware and PIV placed.

## 2017-06-30 NOTE — Consult Note (Signed)
[facility]       Reason for Consult: lung abscess    Referring Physician: Dr. Thedore Mins  Active Problems:   GERD (gastroesophageal reflux disease)   History of Clostridium difficile colitis   HIV (human immunodeficiency virus infection) (HCC)   Abscess of left lung with pneumonia (HCC)   Hypokalemia   Lung abscess (HCC)   Essential hypertension   . aspirin EC  325 mg Oral Daily  . Darunavir-Cobicisctat-Emtricitabine-Tenofovir Alafenamide  1 tablet Oral Q breakfast  . dextromethorphan-guaiFENesin  1 tablet Oral BID  . dolutegravir  50 mg Oral Daily  . enoxaparin (LOVENOX) injection  40 mg Subcutaneous Q24H  . ferrous sulfate  325 mg Oral Q breakfast  . fluconazole  200 mg Oral Daily  . gabapentin  100 mg Oral TID  . lactose free nutrition  237 mL Oral TID WC  . magnesium oxide  400 mg Oral BID  . valACYclovir  500 mg Oral Daily    Recommendations: I have recommended IV unasyn for 3 weeks as already discussed which he refuses  I have suggested that oral Augmentin is an alternative for 2-3 months if he refused above with the caveat that it has a higher chance of failure and he will need close follow up - risks are development of empyema, further surgical debridement, chest tube, VATS, if not successful.   Will monitor via CXR as outpatient He will follow up with me in 1 week  Assessment: He has a lung abscess with some improvement on IV antibiotics.  He was previously discharged with known Streptococcal pneumonia that evolved into abscess and now responding to current IV therapy.    Antibiotics: unasyn  HPI: [name] is a 52 y.o. male with well-controlled HIV on Symtuza and Tivicay and CD4 of 310 with suppressed viral load who was recently hospitalized with CAP and found Strep pneumonia, penicillin sensitive disease and given amoxicillin as outpatient after hospitalization. He returned to the ED with fever, cough and worsening symptoms and  admitted.  He has been on amp/sulbactam and feels better, coughing up sputum.  His repeat CT of his chest shows decrease in the size of the abscess and he feels better.  He was advised by pulmonary and Dr. Thedore Mins that Iv amp/sulbactam will be optimal treatment, which he has refused.  He also had been refusing ID consultation as well until today.   CT independently reviewed and discussed with the patient.  Decrease in abscess but still significant.    Review of Systems:  Constitutional: negative for fevers and chills Gastrointestinal: negative for diarrhea Integument/breast: negative for rash All other systems reviewed and are negative    Past Medical History:  Diagnosis Date  . HIV (human immunodeficiency virus infection) (HCC)   . MRSA (methicillin resistant staph aureus) culture positive   . Streptococcal pneumonia (HCC)     Social History   Tobacco Use  . Smoking status: Former Games developer  . Smokeless tobacco: Current User    Types: Snuff  Substance Use Topics  . Alcohol use: Never    Frequency: Never  . Drug use: No    Family History  Problem Relation Age of Onset  . Cancer Mother   . CAD Brother     Allergies  Allergen Reactions  . Sulfa Antibiotics Rash  . Tramadol Nausea Only    Physical Exam: Constitutional: in no apparent distress and alert  Vitals:   06/29/17 2018 06/30/17 0417  BP: 121/80  105/73  Pulse: 70 62  Resp: 16 16  Temp: 98.1 F (36.7 C) 98.1 F (36.7 C)  SpO2: 98% 95%   EYES: anicteric ENMT: no thrush Cardiovascular: Cor RRR Respiratory: diminished at the left base; normal respiratory effort GI: Bowel sounds are normal, liver is not enlarged, spleen is not enlarged Musculoskeletal: no pedal edema noted Skin: negatives: no rash Hematologic: no cervical lad  Lab Results  Component Value Date   WBC 5.8 06/30/2017   HGB 10.5 (L) 06/30/2017   HCT 33.8 (L) 06/30/2017   MCV 83.0 06/30/2017   PLT 543 (H) 06/30/2017    Lab Results  Component  Value Date   CREATININE 1.05 06/29/2017   BUN 11 06/29/2017   NA 138 06/29/2017   K 4.0 06/29/2017   CL 103 06/29/2017   CO2 24 06/29/2017    Lab Results  Component Value Date   ALT 27 06/21/2017   AST 20 06/21/2017   ALKPHOS 120 06/21/2017     Microbiology: Recent Results (from the past 240 hour(s))  Blood culture (routine x 2)     Status: None   Collection Time: 06/21/17  7:49 AM  Result Value Ref Range Status   Specimen Description   Final    BLOOD RIGHT ARM Performed at [facility], [address] [place], [place] [address]    Special Requests   Final    BOTTLES DRAWN AEROBIC AND ANAEROBIC Blood Culture results may not be optimal due to an excessive volume of blood received in culture bottles Performed at [facility], [address] [place], [place] [address]    Culture   Final    NO GROWTH 5 DAYS Performed at [facility], [address] [place], [place] [address]    Report Status 06/26/2017 FINAL  Final  Blood culture (routine x 2)     Status: None   Collection Time: 06/21/17  9:31 AM  Result Value Ref Range Status   Specimen Description   Final    BLOOD RIGHT ANTECUBITAL Performed at [facility], [address] [place], [place] [address]    Special Requests   Final    BOTTLES DRAWN AEROBIC AND ANAEROBIC Blood Culture results may not be optimal due to an excessive volume of blood received in culture bottles Performed at [facility], [address] [place], [place] [address]    Culture   Final    NO GROWTH 5 DAYS Performed at [facility], [address] [place], [place] [address]    Report Status 06/26/2017 FINAL  Final  MRSA PCR Screening     Status: None   Collection Time: 06/22/17  3:46 AM  Result Value Ref Range Status   MRSA by PCR NEGATIVE NEGATIVE Final    Comment:        The GeneXpert MRSA Assay (FDA approved for NASAL specimens only), is one component of a comprehensive MRSA  colonization surveillance program. It is not intended to diagnose MRSA infection nor to guide or monitor treatment for MRSA infections. Performed at [facility], [address] [place], [place] [address]   Culture, expectorated sputum-assessment     Status: None   Collection Time: 06/28/17  2:41 PM  Result Value Ref Range Status   Specimen Description SPUTUM  Final   Special Requests NONE  Final   Sputum evaluation   Final    THIS SPECIMEN IS ACCEPTABLE FOR SPUTUM CULTURE Performed  at [facility], [address] [place], [place] [address]    Report Status 06/28/2017 FINAL  Final  Culture, respiratory (NON-Expectorated)     Status: None (Preliminary result)   Collection Time: 06/28/17  2:41 PM  Result Value Ref Range Status   Specimen Description   Final    SPUTUM Performed at [facility], [address] [place], [place] [address]    Special Requests   Final    NONE Reflexed from [id number] Performed at [facility], [address] [place], [place] [address]    Gram Stain NO WBC SEEN RARE GRAM POSITIVE COCCI   Final   Culture   Final    CULTURE REINCUBATED FOR BETTER GROWTH Performed at [facility], [address], [place], [place] [address]    Report Status PENDING  Incomplete    Gardiner Barefoot, MD [facility] [email or web address] [phone] pager  [phone] cell 06/30/2017, 2:43 PM

## 2017-06-30 NOTE — [place] FL2 (Signed)
[place] MEDICAID FL2 LEVEL OF CARE SCREENING TOOL     IDENTIFICATION  Patient Name: [name] Birthdate: 06/10/1965 Sex: male Admission Date (Current Location): 06/21/2017  [facility] [place]   Facility and Address:  [facility],  [address], [place] [address]      Provider Number: [id number]  Attending Physician Name and Address:  Leroy SeaSingh, Prashant K, MD  Relative Name and Phone Number:       Current Level of Care: Hospital Recommended Level of Care: Skilled Nursing Facility Prior Approval Number:    Date Approved/Denied:   PASRR Number: [id number]  Discharge Plan: SNF    Current Diagnoses: Patient Active Problem List   Diagnosis Date Noted  . Essential hypertension   . Lung abscess (HCC) 06/21/2017  . Bacteremia   . Lung nodule, multiple 06/14/2017  . Abscess of left lung with pneumonia (HCC) 06/14/2017  . Sepsis (HCC)   . Hypokalemia   . Asthma 04/22/2017  . History of Clostridium difficile colitis 04/22/2017  . Infectious folliculitis 01/30/2017  . Folliculitis 01/30/2017  . Complication of implanted device 07/24/2015  . 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 GERD (gastroesophageal reflux disease) 01/25/2015  . Hypotension 01/25/2015  . HIV (human immunodeficiency virus infection) (HCC) 01/24/2015  . Primary osteoarthritis of both hips 03/29/2013  . History of pulmonary embolus (PE) 03/17/2013  . 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    Orientation RESPIRATION BLADDER Height & Weight     Self, Time, Situation, Place  Normal Continent Weight: 185 lb (83.9 kg) Height:  6\' 2"  (188 cm)  BEHAVIORAL SYMPTOMS/MOOD NEUROLOGICAL BOWEL NUTRITION STATUS        Diet(see dc summary)  AMBULATORY STATUS COMMUNICATION OF NEEDS Skin   Supervision Verbally Normal                       Personal  Care Assistance Level of Assistance  Bathing, Feeding, Dressing Bathing Assistance: Limited assistance Feeding assistance: Independent Dressing Assistance: Limited assistance     Functional Limitations Info  Sight, Hearing, Speech Sight Info: Adequate Hearing Info: Adequate Speech Info: Adequate    SPECIAL CARE FACTORS FREQUENCY                       Contractures Contractures Info: Not present    Additional Factors Info  Code Status, Allergies Code Status Info: DNR Allergies Info: Sulfa Antibiotics;Tramadol           Current Medications (06/30/2017):  This is the current hospital active medication list Current Facility-Administered Medications  Medication Dose Route Frequency Provider Last Rate Last Dose  . acetaminophen (TYLENOL) tablet 650 mg  650 mg Oral Q6H PRN Short, Thea SilversmithMackenzie, MD       Or  . acetaminophen (TYLENOL) suppository 650 mg  650 mg Rectal Q6H PRN Short, Mackenzie, MD      . albuterol (PROVENTIL) (2.5 MG/3ML) 0.083% nebulizer solution 2.5 mg  2.5 mg Nebulization Q6H PRN Audrea MuscatBlount, Xenia T, NP   2.5 mg at 06/24/17 0545  . Ampicillin-Sulbactam (UNASYN) 3 g in sodium chloride 0.9 % 100 mL IVPB  3 g Intravenous Q6H Pham, Anh P, RPH 200 mL/hr at 06/30/17 0607 3 g at 06/30/17 0607  . aspirin EC tablet 325 mg  325 mg Oral Daily Renae FickleShort, Mackenzie, MD   325 mg at 06/30/17 0904  .  benzonatate (TESSALON) capsule 200 mg  200 mg Oral TID PRN Maretta Bees, MD   200 mg at 06/27/17 2000  . bisacodyl (DULCOLAX) EC tablet 5 mg  5 mg Oral Daily PRN Short, Mackenzie, MD      . chlorpheniramine-HYDROcodone (TUSSIONEX) 10-8 MG/5ML suspension 5 mL  5 mL Oral Q12H PRN Maretta Bees, MD   5 mL at 06/25/17 1529  . Darunavir-Cobicisctat-Emtricitabine-Tenofovir Alafenamide (SYMTUZA) 800-150-200-10 MG TABS 1 tablet  1 tablet Oral Q breakfast Short, Mackenzie, MD   1 tablet at 06/30/17 0904  . dextromethorphan-guaiFENesin (MUCINEX DM) 30-600 MG per 12 hr tablet 1 tablet  1  tablet Oral BID Renae Fickle, MD   1 tablet at 06/30/17 0904  . dolutegravir (TIVICAY) tablet 50 mg  50 mg Oral Daily Renae Fickle, MD   50 mg at 06/30/17 0904  . enoxaparin (LOVENOX) injection 40 mg  40 mg Subcutaneous Q24H Short, Mackenzie, MD      . ferrous sulfate tablet 325 mg  325 mg Oral Q breakfast Renae Fickle, MD   325 mg at 06/30/17 0904  . fluconazole (DIFLUCAN) tablet 200 mg  200 mg Oral Daily Short, Mackenzie, MD   200 mg at 06/30/17 0904  . gabapentin (NEURONTIN) capsule 100 mg  100 mg Oral TID Renae Fickle, MD   100 mg at 06/30/17 0904  . guaiFENesin-dextromethorphan (ROBITUSSIN DM) 100-10 MG/5ML syrup 5 mL  5 mL Oral Q4H PRN Maretta Bees, MD   5 mL at 06/29/17 0514  . ibuprofen (ADVIL,MOTRIN) tablet 800 mg  800 mg Oral Q8H PRN Renae Fickle, MD   800 mg at 06/29/17 0514  . lactose free nutrition (BOOST PLUS) liquid 237 mL  237 mL Oral TID WC Max Fickle B, MD   237 mL at 06/28/17 1011  . magnesium oxide (MAG-OX) tablet 400 mg  400 mg Oral BID Renae Fickle, MD   400 mg at 06/30/17 0904  . nitroGLYCERIN (NITROSTAT) SL tablet 0.4 mg  0.4 mg Sublingual Q5 min PRN Short, Mackenzie, MD      . ondansetron (ZOFRAN) tablet 4 mg  4 mg Oral Q6H PRN Short, Mackenzie, MD       Or  . ondansetron (ZOFRAN) injection 4 mg  4 mg Intravenous Q6H PRN Renae Fickle, MD   4 mg at 06/21/17 1905  . polyethylene glycol (MIRALAX / GLYCOLAX) packet 17 g  17 g Oral Daily PRN Short, Mackenzie, MD      . valACYclovir (VALTREX) tablet 500 mg  500 mg Oral Daily Short, Mackenzie, MD   500 mg at 06/30/17 4098  . zolpidem (AMBIEN) tablet 5-10 mg  5-10 mg Oral QHS PRN Delano Metz, MD   10 mg at 06/29/17 2206     Discharge Medications: Please see discharge summary for a list of discharge medications.  Relevant Imaging Results:  Relevant Lab Results:   Additional Information SSN  [id number]  Antionette Poles, LCSW

## 2017-07-01 LAB — CULTURE, RESPIRATORY: GRAM STAIN: NONE SEEN

## 2017-07-01 LAB — CULTURE, RESPIRATORY W GRAM STAIN: Culture: NORMAL

## 2017-07-08 ENCOUNTER — Telehealth: Payer: Self-pay | Admitting: *Deleted

## 2017-07-08 NOTE — Telephone Encounter (Signed)
Contacted the patient to check in and assess his needs. Mobile number is not active and left a message on home number listed.

## 2017-07-13 ENCOUNTER — Telehealth: Payer: Self-pay | Admitting: *Deleted

## 2017-07-13 NOTE — Telephone Encounter (Signed)
The intent of this communication is to inform the Health Care Team that this patient will be discharged from [facility]).  Greater than 3 attempts have been made to re-engage the patient  without any success. I have left message and if the patient returns my calls I will be happy to re-engage with him.    Effective 07/14/17 patient will be discharged and removed from [facility] active patient listing

## 2017-07-14 NOTE — Telephone Encounter (Signed)
Thanks Ambre! 

## 2017-07-16 ENCOUNTER — Inpatient Hospital Stay: Payer: Medicare Other | Admitting: Internal Medicine

## 2017-08-02 ENCOUNTER — Other Ambulatory Visit: Payer: Self-pay | Admitting: Family Medicine

## 2017-08-05 ENCOUNTER — Inpatient Hospital Stay: Payer: Medicare Other | Admitting: Family

## 2017-08-05 NOTE — Progress Notes (Deleted)
Subjective:    Patient ID: [name], male    DOB: 04/04/1965, 52 y.o.   MRN: [id number]  No chief complaint on file.   HPI:  [name] is a 52 y.o. male who presents today for initial office visit following hospitalization.  [name] was recently evaluated in the ED and admitted to the hospital with the chief complaint of shortness of breath and worsening pneumonia as he was previously discharged from the hospital with CAP and found to have penicillin sensitive Strep pneumoniae. During his initial hospitalization he was noted to have an abscess which decreased in size on follow up CT scan on 4/15. He received Unasyn while in the hospital with the recommendation for 3 weeks of IV therapy which the patient refused. Secondary recommendation was for 2-3 months of Augmentin and close monitoring with greater change of treatment failure. He was initially supposed to follow up on 5/2 however he did not show for that appointment. Per phone notes he has been discharged from [facility] with greater than 3 attempts to contact failed. All hospital/clinic notes, labs and imaging were reviewed in detail.     Allergies  Allergen Reactions  . Sulfa Antibiotics Rash  . Tramadol Nausea Only      Outpatient Medications Prior to Visit  Medication Sig Dispense Refill  . albuterol (PROVENTIL HFA;VENTOLIN HFA) 108 (90 Base) MCG/ACT inhaler Inhale 2 puffs into the lungs every 6 (six) hours as needed for wheezing or shortness of breath. 1 Inhaler 2  . amoxicillin-clavulanate (AUGMENTIN) 875-125 MG tablet Take 1 tablet by mouth 2 (two) times daily. 60 tablet 1  . Darunavir-Cobicisctat-Emtricitabine-Tenofovir Alafenamide (SYMTUZA) 800-150-200-10 MG TABS Take 1 tablet by mouth daily with breakfast. 30 tablet 5  . dextromethorphan-guaiFENesin (MUCINEX DM) 30-600 MG 12hr tablet Take 1 tablet by mouth 2 (two) times daily. 30 tablet 0  . dolutegravir (TIVICAY) 50 MG tablet  Take 1 tablet (50 mg total) by mouth daily. 30 tablet 5  . fluconazole (DIFLUCAN) 200 MG tablet Take 1 tablet (200 mg total) by mouth daily. 7 tablet 5  . gabapentin (NEURONTIN) 100 MG capsule Take 1 capsule (100 mg total) by mouth 3 (three) times daily. 30 capsule 11  . ibuprofen (ADVIL,MOTRIN) 800 MG tablet Take 1 tablet (800 mg total) by mouth every 8 (eight) hours as needed for moderate pain. 30 tablet 0  . MAGNESIUM OXIDE PO Take 2 tablets by mouth 2 (two) times daily.    . valACYclovir (VALTREX) 500 MG tablet Take 1 tablet (500 mg total) by mouth daily. 30 tablet 11  . VITAMIN D, ERGOCALCIFEROL, PO Take 1 tablet by mouth daily.     No facility-administered medications prior to visit.      Past Medical History:  Diagnosis Date  . HIV (human immunodeficiency virus infection) (HCC)   . MRSA (methicillin resistant staph aureus) culture positive   . Streptococcal pneumonia [facility])       Past Surgical History:  Procedure Laterality Date  . 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 Cancer Mother   . CAD Brother       Social History   Socioeconomic History  . Marital status: Legally Separated    Spouse name: Not on file  . Number of children: Not on file  . Years of education: Not on file  .  Highest education level: Not on file  Occupational History  . Not on file  Social Needs  . Financial resource strain: Not on file  . Food insecurity:    Worry: Not on file    Inability: Not on file  . Transportation needs:    Medical: Not on file    Non-medical: Not on file  Tobacco Use  . Smoking status: Former Games developer  . Smokeless tobacco: Current User    Types: Snuff  Substance and Sexual Activity  . Alcohol use: Never    Frequency: Never  . Drug use: No  . Sexual activity: Not on file  Lifestyle  . Physical activity:    Days per week: Not on file    Minutes  per session: Not on file  . Stress: Not on file  Relationships  . Social connections:    Talks on phone: Not on file    Gets together: Not on file    Attends religious service: Not on file    Active member of club or organization: Not on file    Attends meetings of clubs or organizations: Not on file    Relationship status: Not on file  . Intimate partner violence:    Fear of current or ex partner: Not on file    Emotionally abused: Not on file    Physically abused: Not on file    Forced sexual activity: Not on file  Other Topics Concern  . Not on file  Social History Narrative  . Not on file      Review of Systems     Objective:    There were no vitals taken for this visit. Nursing note and vital signs reviewed.  Physical Exam      Assessment & Plan:   Problem List Items Addressed This Visit    None       I am having [name] maintain his Darunavir-Cobicisctat-Emtricitabine-Tenofovir Alafenamide, dolutegravir, fluconazole, gabapentin, valACYclovir, ibuprofen, albuterol, (VITAMIN D, ERGOCALCIFEROL, PO), MAGNESIUM OXIDE PO, dextromethorphan-guaiFENesin, and amoxicillin-clavulanate.   No orders of the defined types were placed in this encounter.    Follow-up: No follow-ups on file.  Jeanine Luz, FNP [facility]

## 2017-09-01 ENCOUNTER — Other Ambulatory Visit (INDEPENDENT_AMBULATORY_CARE_PROVIDER_SITE_OTHER): Payer: Self-pay | Admitting: Orthopaedic Surgery

## 2017-09-03 DIAGNOSIS — M25551 Pain in right hip: Secondary | ICD-10-CM | POA: Diagnosis not present

## 2017-09-03 DIAGNOSIS — M1611 Unilateral primary osteoarthritis, right hip: Secondary | ICD-10-CM | POA: Diagnosis not present

## 2017-09-03 DIAGNOSIS — H524 Presbyopia: Secondary | ICD-10-CM | POA: Diagnosis not present

## 2017-09-03 DIAGNOSIS — H5213 Myopia, bilateral: Secondary | ICD-10-CM | POA: Diagnosis not present

## 2017-09-03 DIAGNOSIS — M87151 Osteonecrosis due to drugs, right femur: Secondary | ICD-10-CM | POA: Diagnosis not present

## 2017-09-03 DIAGNOSIS — H52223 Regular astigmatism, bilateral: Secondary | ICD-10-CM | POA: Diagnosis not present

## 2017-09-28 ENCOUNTER — Other Ambulatory Visit: Payer: Self-pay | Admitting: Family Medicine

## 2017-10-10 DIAGNOSIS — L03116 Cellulitis of left lower limb: Secondary | ICD-10-CM | POA: Diagnosis not present

## 2017-10-10 DIAGNOSIS — M7989 Other specified soft tissue disorders: Secondary | ICD-10-CM | POA: Diagnosis not present

## 2017-10-16 ENCOUNTER — Other Ambulatory Visit: Payer: Self-pay | Admitting: Family Medicine

## 2017-10-16 ENCOUNTER — Other Ambulatory Visit (INDEPENDENT_AMBULATORY_CARE_PROVIDER_SITE_OTHER): Payer: Self-pay | Admitting: Orthopaedic Surgery

## 2017-10-16 NOTE — Telephone Encounter (Signed)
Ok for refill? 

## 2017-10-16 NOTE — Telephone Encounter (Signed)
yes

## 2017-10-26 NOTE — Patient Instructions (Signed)
[name]  10/26/2017   Your procedure is scheduled on:  11/03/2017   [facility]  Report to admitting at    0610 AM    Call this number if you have problems the morning of surgery [phone]   Remember: Do not eat food or drink liquids :After Midnight.     Take these medicines the morning of surgery with A SIP OF WATER: Inhalers as usual and bring, Gabapentin,                                 You may not have any metal on your body including hair pins and              piercings  Do not wear jewelry,  lotions, powders or perfumes, deodorant                          Men may shave face and neck.   Do not bring valuables to the hospital. [facility] IS NOT             RESPONSIBLE   FOR VALUABLES.  Contacts, dentures or bridgework may not be worn into surgery.  Leave suitcase in the car. After surgery it may be brought to your room.                     Please read over the following fact sheets you were given: _____________________________________________________________________             [facility] - Preparing for Surgery Before surgery, you can play an important role.  Because skin is not sterile, your skin needs to be as free of germs as possible.  You can reduce the number of germs on your skin by washing with CHG (chlorahexidine gluconate) soap before surgery.  CHG is an antiseptic cleaner which kills germs and bonds with the skin to continue killing germs even after washing. Please DO NOT use if you have an allergy to CHG or antibacterial soaps.  If your skin becomes reddened/irritated stop using the CHG and inform your nurse when you arrive at Short Stay. Do not shave (including legs and underarms) for at least 48 hours prior to the first CHG shower.  You may shave your face/neck. Please follow these instructions carefully:  1.  Shower with CHG Soap the night before surgery and the  morning of Surgery.  2.  If you  choose to wash your hair, wash your hair first as usual with your  normal  shampoo.  3.  After you shampoo, rinse your hair and body thoroughly to remove the  shampoo.                           4.  Use CHG as you would any other liquid soap.  You can apply chg directly  to the skin and wash                       Gently with a scrungie or clean washcloth.  5.  Apply the CHG Soap to your body ONLY FROM THE NECK DOWN.   Do not use on face/ open  Wound or open sores. Avoid contact with eyes, ears mouth and genitals (private parts).                       Wash face,  Genitals (private parts) with your normal soap.             6.  Wash thoroughly, paying special attention to the area where your surgery  will be performed.  7.  Thoroughly rinse your body with warm water from the neck down.  8.  DO NOT shower/wash with your normal soap after using and rinsing off  the CHG Soap.                9.  Pat yourself dry with a clean towel.            10.  Wear clean pajamas.            11.  Place clean sheets on your bed the night of your first shower and do not  sleep with pets. Day of Surgery : Do not apply any lotions/deodorants the morning of surgery.  Please wear clean clothes to the hospital/surgery center.  FAILURE TO FOLLOW THESE INSTRUCTIONS MAY RESULT IN THE CANCELLATION OF YOUR SURGERY PATIENT SIGNATURE_________________________________  NURSE SIGNATURE__________________________________  ________________________________________________________________________  WHAT IS A BLOOD TRANSFUSION? Blood Transfusion Information  A transfusion is the replacement of blood or some of its parts. Blood is made up of multiple cells which provide different functions.  Red blood cells carry oxygen and are used for blood loss replacement.  White blood cells fight against infection.  Platelets control bleeding.  Plasma helps clot blood.  Other blood products are available for  specialized needs, such as hemophilia or other clotting disorders. BEFORE THE TRANSFUSION  Who gives blood for transfusions?   Healthy volunteers who are fully evaluated to make sure their blood is safe. This is blood bank blood. Transfusion therapy is the safest it has ever been in the practice of medicine. Before blood is taken from a donor, a complete history is taken to make sure that person has no history of diseases nor engages in risky social behavior (examples are intravenous drug use or sexual activity with multiple partners). The donor's travel history is screened to minimize risk of transmitting infections, such as malaria. The donated blood is tested for signs of infectious diseases, such as HIV and hepatitis. The blood is then tested to be sure it is compatible with you in order to minimize the chance of a transfusion reaction. If you or a relative donates blood, this is often done in anticipation of surgery and is not appropriate for emergency situations. It takes many days to process the donated blood. RISKS AND COMPLICATIONS Although transfusion therapy is very safe and saves many lives, the main dangers of transfusion include:   Getting an infectious disease.  Developing a transfusion reaction. This is an allergic reaction to something in the blood you were given. Every precaution is taken to prevent this. The decision to have a blood transfusion has been considered carefully by your caregiver before blood is given. Blood is not given unless the benefits outweigh the risks. AFTER THE TRANSFUSION  Right after receiving a blood transfusion, you will usually feel much better and more energetic. This is especially true if your red blood cells have gotten low (anemic). The transfusion raises the level of the red blood cells which carry oxygen, and this usually causes an energy increase.  The  nurse administering the transfusion will monitor you carefully for complications. HOME CARE  INSTRUCTIONS  No special instructions are needed after a transfusion. You may find your energy is better. Speak with your caregiver about any limitations on activity for underlying diseases you may have. SEEK MEDICAL CARE IF:   Your condition is not improving after your transfusion.  You develop redness or irritation at the intravenous (IV) site. SEEK IMMEDIATE MEDICAL CARE IF:  Any of the following symptoms occur over the next 12 hours:  Shaking chills.  You have a temperature by mouth above 102 F (38.9 C), not controlled by medicine.  Chest, back, or muscle pain.  People around you feel you are not acting correctly or are confused.  Shortness of breath or difficulty breathing.  Dizziness and fainting.  You get a rash or develop hives.  You have a decrease in urine output.  Your urine turns a dark color or changes to pink, red, or brown. Any of the following symptoms occur over the next 10 days:  You have a temperature by mouth above 102 F (38.9 C), not controlled by medicine.  Shortness of breath.  Weakness after normal activity.  The white part of the eye turns yellow (jaundice).  You have a decrease in the amount of urine or are urinating less often.  Your urine turns a dark color or changes to pink, red, or brown. Document Released: 02/29/2000 Document Revised: 05/26/2011 Document Reviewed: 10/18/2007 ExitCare Patient Information 2014 [place].  _______________________________________________________________________  Incentive Spirometer  An incentive spirometer is a tool that can help keep your lungs clear and active. This tool measures how well you are filling your lungs with each breath. Taking long deep breaths may help reverse or decrease the chance of developing breathing (pulmonary) problems (especially infection) following:  A long period of time when you are unable to move or be active. BEFORE THE PROCEDURE   If the spirometer includes an  indicator to show your best effort, your nurse or respiratory therapist will set it to a desired goal.  If possible, sit up straight or lean slightly forward. Try not to slouch.  Hold the incentive spirometer in an upright position. INSTRUCTIONS FOR USE  1. Sit on the edge of your bed if possible, or sit up as far as you can in bed or on a chair. 2. Hold the incentive spirometer in an upright position. 3. Breathe out normally. 4. Place the mouthpiece in your mouth and seal your lips tightly around it. 5. Breathe in slowly and as deeply as possible, raising the piston or the ball toward the top of the column. 6. Hold your breath for 3-5 seconds or for as long as possible. Allow the piston or ball to fall to the bottom of the column. 7. Remove the mouthpiece from your mouth and breathe out normally. 8. Rest for a few seconds and repeat Steps 1 through 7 at least 10 times every 1-2 hours when you are awake. Take your time and take a few normal breaths between deep breaths. 9. The spirometer may include an indicator to show your best effort. Use the indicator as a goal to work toward during each repetition. 10. After each set of 10 deep breaths, practice coughing to be sure your lungs are clear. If you have an incision (the cut made at the time of surgery), support your incision when coughing by placing a pillow or rolled up towels firmly against it. Once you are able to get  out of bed, walk around indoors and cough well. You may stop using the incentive spirometer when instructed by your caregiver.  RISKS AND COMPLICATIONS  Take your time so you do not get dizzy or light-headed.  If you are in pain, you may need to take or ask for pain medication before doing incentive spirometry. It is harder to take a deep breath if you are having pain. AFTER USE  Rest and breathe slowly and easily.  It can be helpful to keep track of a log of your progress. Your caregiver can provide you with a simple table  to help with this. If you are using the spirometer at home, follow these instructions: [place] IF:   You are having difficultly using the spirometer.  You have trouble using the spirometer as often as instructed.  Your pain medication is not giving enough relief while using the spirometer.  You develop fever of 100.5 F (38.1 C) or higher. SEEK IMMEDIATE MEDICAL CARE IF:   You cough up bloody sputum that had not been present before.  You develop fever of 102 F (38.9 C) or greater.  You develop worsening pain at or near the incision site. MAKE SURE YOU:   Understand these instructions.  Will watch your condition.  Will get help right away if you are not doing well or get worse. Document Released: 07/14/2006 Document Revised: 05/26/2011 Document Reviewed: 09/14/2006 [facility] Patient Information 2014 [place], [place].   ________________________________________________________________________

## 2017-10-27 ENCOUNTER — Inpatient Hospital Stay (HOSPITAL_COMMUNITY)
Admission: RE | Admit: 2017-10-27 | Discharge: 2017-10-27 | Disposition: A | Discharge status: Home / Self Care | Payer: Medicare Other | Source: Ambulatory Visit

## 2017-10-27 NOTE — Progress Notes (Addendum)
Echo 06-17-17 epic   CT chest 06-29-17 epic   ekg 06-22-17 epic

## 2017-11-03 ENCOUNTER — Encounter (HOSPITAL_COMMUNITY): Admission: RE | Payer: Self-pay | Source: Ambulatory Visit

## 2017-11-03 ENCOUNTER — Inpatient Hospital Stay (HOSPITAL_COMMUNITY): Admission: RE | Admit: 2017-11-03 | Payer: Medicare Other | Source: Ambulatory Visit | Admitting: Orthopedic Surgery

## 2017-11-03 SURGERY — ARTHROPLASTY, HIP, TOTAL, ANTERIOR APPROACH
Anesthesia: Spinal | Site: Hip | Laterality: Right

## 2017-11-04 ENCOUNTER — Encounter (HOSPITAL_COMMUNITY): Payer: Self-pay | Admitting: Emergency Medicine

## 2017-11-04 ENCOUNTER — Emergency Department (HOSPITAL_COMMUNITY)
Admission: EM | Admit: 2017-11-04 | Discharge: 2017-11-05 | Discharge status: Against Medical Advice | Payer: Medicare Other | Attending: Emergency Medicine | Admitting: Emergency Medicine

## 2017-11-04 DIAGNOSIS — L97521 Non-pressure chronic ulcer of other part of left foot limited to breakdown of skin: Secondary | ICD-10-CM | POA: Diagnosis not present

## 2017-11-04 DIAGNOSIS — L97901 Non-pressure chronic ulcer of unspecified part of unspecified lower leg limited to breakdown of skin: Secondary | ICD-10-CM

## 2017-11-04 DIAGNOSIS — L97921 Non-pressure chronic ulcer of unspecified part of left lower leg limited to breakdown of skin: Secondary | ICD-10-CM | POA: Diagnosis not present

## 2017-11-04 DIAGNOSIS — R2243 Localized swelling, mass and lump, lower limb, bilateral: Secondary | ICD-10-CM | POA: Insufficient documentation

## 2017-11-04 LAB — CBC WITH DIFFERENTIAL/PLATELET
Abs Immature Granulocytes: 0.1 10*3/uL (ref 0.0–0.1)
Basophils Absolute: 0.1 10*3/uL (ref 0.0–0.1)
Basophils Relative: 1 %
EOS ABS: 1.7 10*3/uL — AB (ref 0.0–0.7)
Eosinophils Relative: 18 %
HEMATOCRIT: 39.4 % (ref 39.0–52.0)
Hemoglobin: 13.1 g/dL (ref 13.0–17.0)
IMMATURE GRANULOCYTES: 1 %
LYMPHS ABS: 2 10*3/uL (ref 0.7–4.0)
Lymphocytes Relative: 21 %
MCH: 30.2 pg (ref 26.0–34.0)
MCHC: 33.2 g/dL (ref 30.0–36.0)
MCV: 90.8 fL (ref 78.0–100.0)
Monocytes Absolute: 0.6 10*3/uL (ref 0.1–1.0)
Monocytes Relative: 7 %
Neutro Abs: 5.3 10*3/uL (ref 1.7–7.7)
Neutrophils Relative %: 54 %
Platelets: 209 10*3/uL (ref 150–400)
RBC: 4.34 MIL/uL (ref 4.22–5.81)
RDW: 16.1 % — AB (ref 11.5–15.5)
WBC: 9.8 10*3/uL (ref 4.0–10.5)

## 2017-11-04 LAB — BASIC METABOLIC PANEL
Anion gap: 10 (ref 5–15)
BUN: 19 mg/dL (ref 6–20)
CALCIUM: 8.9 mg/dL (ref 8.9–10.3)
CO2: 17 mmol/L — AB (ref 22–32)
Chloride: 115 mmol/L — ABNORMAL HIGH (ref 98–111)
Creatinine, Ser: 1.25 mg/dL — ABNORMAL HIGH (ref 0.61–1.24)
GFR calc non Af Amer: >60 mL/min (ref >60)
Glucose, Bld: 89 mg/dL (ref 70–99)
Potassium: 3.7 mmol/L (ref 3.5–5.1)
Sodium: 142 mmol/L (ref 135–145)

## 2017-11-04 LAB — I-STAT CG4 LACTIC ACID, ED: Lactic Acid, Venous: 0.39 mmol/L — ABNORMAL LOW (ref 0.5–1.9)

## 2017-11-04 LAB — BRAIN NATRIURETIC PEPTIDE: B NATRIURETIC PEPTIDE 5: 42 pg/mL (ref 0.0–100.0)

## 2017-11-04 NOTE — ED Provider Notes (Signed)
Patient placed in Quick Look pathway, seen and evaluated   Chief Complaint: leg pain  HPI:   Painful lesion to the left leg 1 month ago. Seen in [place] 3 weeks ago and put on antibiotics. statse helped some, but once finished antibiotics it got worse. Now with bilateral leg swelling. Lesion is draining purulent drainage. Applying antibiotic cream and soaking in epsom salt.  ROS: leg wound, leg swelling, leg pain  Physical Exam:   Gen: No distress  Neuro: Awake and Alert  Skin: Warm    Focused Exam: 3x3cm ulceration to the left medial foot, just posterior to medial malleolus. Bilateral LE edam, 2+ pitting. Lungs clear to ausculation. DP pulses intact.    Initiation of care has begun. The patient has been counseled on the process, plan, and necessity for staying for the completion/evaluation, and the remainder of the medical screening examination  LE edema bilatera, 2+. Ulcer to the medial left ankle. Concerning for venous stasis ulcer. Labs, bnp. Afebrile. No concern for sepsis.     Jaynie CrumbleKirichenko, Fabio Wah, PA-C 11/04/17 2005    [address], Dan, DO 11/04/17 [id number]

## 2017-11-04 NOTE — ED Triage Notes (Signed)
Pt presents with continued pain and problems from possible spider/snake bite; pt given antibiotics but now bruising; L medial ankle/foot Pt adds that he has gained weight, bilat lower extremity edema noted

## 2017-11-05 NOTE — ED Notes (Signed)
Pt seen

## 2017-11-05 NOTE — ED Notes (Signed)
No reply for vitals x3. 

## 2017-11-05 NOTE — ED Notes (Signed)
Pt was seen leaving ED lobby and walking across parking lot by registration.

## 2017-11-08 ENCOUNTER — Emergency Department (HOSPITAL_COMMUNITY)
Admission: EM | Admit: 2017-11-08 | Discharge: 2017-11-08 | Disposition: A | Discharge status: Home / Self Care | Payer: Medicare Other | Attending: Emergency Medicine | Admitting: Emergency Medicine

## 2017-11-08 ENCOUNTER — Encounter (HOSPITAL_COMMUNITY): Payer: Self-pay

## 2017-11-08 ENCOUNTER — Emergency Department (HOSPITAL_COMMUNITY): Payer: Medicare Other

## 2017-11-08 DIAGNOSIS — I10 Essential (primary) hypertension: Secondary | ICD-10-CM | POA: Diagnosis not present

## 2017-11-08 DIAGNOSIS — Z79899 Other long term (current) drug therapy: Secondary | ICD-10-CM | POA: Diagnosis not present

## 2017-11-08 DIAGNOSIS — Z87891 Personal history of nicotine dependence: Secondary | ICD-10-CM | POA: Diagnosis not present

## 2017-11-08 DIAGNOSIS — Z4800 Encounter for change or removal of nonsurgical wound dressing: Secondary | ICD-10-CM | POA: Insufficient documentation

## 2017-11-08 DIAGNOSIS — Z5189 Encounter for other specified aftercare: Secondary | ICD-10-CM

## 2017-11-08 DIAGNOSIS — R079 Chest pain, unspecified: Secondary | ICD-10-CM | POA: Diagnosis not present

## 2017-11-08 DIAGNOSIS — R0602 Shortness of breath: Secondary | ICD-10-CM | POA: Diagnosis not present

## 2017-11-08 HISTORY — DX: Immunodeficiency, unspecified: D84.9

## 2017-11-08 LAB — I-STAT CG4 LACTIC ACID, ED: Lactic Acid, Venous: 1.03 mmol/L (ref 0.5–1.9)

## 2017-11-08 LAB — CBC WITH DIFFERENTIAL/PLATELET
Abs Immature Granulocytes: 0 10*3/uL (ref 0.0–0.1)
Basophils Absolute: 0.1 10*3/uL (ref 0.0–0.1)
Basophils Relative: 1 %
EOS ABS: 0.6 10*3/uL (ref 0.0–0.7)
Eosinophils Relative: 12 %
HCT: 42.3 % (ref 39.0–52.0)
Hemoglobin: 14.2 g/dL (ref 13.0–17.0)
IMMATURE GRANULOCYTES: 0 %
Lymphocytes Relative: 29 %
Lymphs Abs: 1.6 10*3/uL (ref 0.7–4.0)
MCH: 30.5 pg (ref 26.0–34.0)
MCHC: 33.6 g/dL (ref 30.0–36.0)
MCV: 91 fL (ref 78.0–100.0)
MONO ABS: 0.5 10*3/uL (ref 0.1–1.0)
MONOS PCT: 9 %
NEUTROS PCT: 49 %
Neutro Abs: 2.7 10*3/uL (ref 1.7–7.7)
PLATELETS: 201 10*3/uL (ref 150–400)
RBC: 4.65 MIL/uL (ref 4.22–5.81)
RDW: 15.9 % — AB (ref 11.5–15.5)
WBC: 5.5 10*3/uL (ref 4.0–10.5)

## 2017-11-08 LAB — BASIC METABOLIC PANEL
Anion gap: 9 (ref 5–15)
BUN: 17 mg/dL (ref 6–20)
CALCIUM: 8.9 mg/dL (ref 8.9–10.3)
CO2: 18 mmol/L — ABNORMAL LOW (ref 22–32)
CREATININE: 1.03 mg/dL (ref 0.61–1.24)
Chloride: 112 mmol/L — ABNORMAL HIGH (ref 98–111)
GFR calc Af Amer: >60 mL/min (ref >60)
GLUCOSE: 87 mg/dL (ref 70–99)
Potassium: 3.5 mmol/L (ref 3.5–5.1)
Sodium: 139 mmol/L (ref 135–145)

## 2017-11-08 LAB — I-STAT TROPONIN, ED: Troponin i, poc: 0.01 ng/mL (ref 0.00–0.08)

## 2017-11-08 MED ORDER — CLINDAMYCIN HCL 300 MG PO CAPS: 300.0000 mg | ORAL_CAPSULE | Freq: Three times a day (TID) | ORAL | 0 refills | Status: AC

## 2017-11-08 NOTE — ED Notes (Signed)
ED Provider at bedside. Courtney  

## 2017-11-08 NOTE — ED Notes (Signed)
EDP aware that patient request update from provider.

## 2017-11-08 NOTE — ED Notes (Addendum)
Courtney PA informed of patient's sinus bradycardia. Patient alert and oriented on cell phone. Denies chest pain or discomfort.

## 2017-11-08 NOTE — Discharge Instructions (Addendum)
You were given a prescription for antibiotics. Please take the antibiotic prescription fully.   You need to follow-up with the wound care clinic and make an appointment for follow-up for further treatment and evaluation of the wound on your leg.  You should also follow-up with your primary doctor in regards to your low heart rate today.  Please return to the emergency room for any fevers, chills, worsening redness or drainage from the wound.  You should also return for any persistent chest pain or shortness of breath.

## 2017-11-08 NOTE — ED Triage Notes (Signed)
Onset Mar-April, 2019 LE swelling with red, flat rash that does not blanche.  Onset 6 months left sided chest pain, occurs daily, x 3-4 times day.  Onset 1 month open wound to left medial ankle.  Seen [facility] 3-4 weeks ago and was put on antibiotics. Right THR had to be cancelled d/t wound on ankle.  Pt c/o bilateral hip and leg pain.   Pt states he wants to know what blood work is from 2 days ago when he had to leave d/t wait time.  Does not want blood drawn now until seen by MD.

## 2017-11-08 NOTE — ED Notes (Signed)
Patient transported to X-ray 

## 2017-11-08 NOTE — ED Provider Notes (Signed)
[facility] EMERGENCY DEPARTMENT Provider Note   CSN: [id number] Arrival date & time: 11/08/17  1119     History   Chief Complaint Chief Complaint  Patient presents with  . Wound Infection  . Weakness  . Leg Swelling    HPI [name] is a 52 y.o. male.  HPI   Pt is a 52 y/o male with a h/o HIV, GERD, MDD, PE who presents to the ED today with multiple complaints.  Pt is c/o a lesion to the left medial ankle that has been present for the last month. He denies fevers or chills. Reports white pus that has been draining from the wound for the last month. He states he was seen at [facility] and states the wound was treated "as a dry snake bite". He was placed on abx. Sxs improved however have not completely resolved. Reports red rash and pain to BLE since 06/2017.   He also c/o of chest pain which has been on going intermittently for 6 months. Chest pain is left sided. Pain feels "like shock waves of pain" and "like an irritated muscle" that is mild. Episodes last for seconds to minutes and resolve on their own. They are not associated with exertion, nausea, diaphoresis. He intermittently has SOB with these episodes. States that the last episode occurred 30 minutes PTA at 11:30AM and resolved on its own. Chest pain is currently 0/10.   Past Medical History:  Diagnosis Date  . HIV (human immunodeficiency virus infection) (HCC)   . Immune deficiency disorder (HCC)   . MRSA (methicillin resistant staph aureus) culture positive   . Streptococcal pneumonia [facility])     Patient Active Problem List   Diagnosis Date Noted  . Essential hypertension   . Lung abscess (HCC) 06/21/2017  . Bacteremia   . Lung nodule, multiple 06/14/2017  . Abscess of left lung with pneumonia (HCC) 06/14/2017  . Sepsis (HCC)   . Hypokalemia   . Asthma 04/22/2017  . History of Clostridium difficile colitis 04/22/2017  . Infectious folliculitis 01/30/2017  . Folliculitis  01/30/2017  . Complication of implanted device 07/24/2015  . 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 GERD (gastroesophageal reflux disease) 01/25/2015  . Hypotension 01/25/2015  . HIV (human immunodeficiency virus infection) (HCC) 01/24/2015  . Primary osteoarthritis of both hips 03/29/2013  . History of pulmonary embolus (PE) 03/17/2013  . 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 Authorizing Provider  albuterol (PROVENTIL HFA;VENTOLIN HFA) 108 (90 Base) MCG/ACT inhaler Inhale 2 puffs into the lungs every 6 (six) hours as needed for wheezing or shortness of breath. 06/18/17  Yes Garnette Gunner, MD  Darunavir-Cobicisctat-Emtricitabine-Tenofovir Alafenamide (SYMTUZA) 800-150-200-10 MG TABS Take 1 tablet by mouth daily with breakfast. Patient taking differently: Take 1 tablet by mouth daily.  05/04/17  Yes Daiva Eves, Lisette Grinder, MD  dolutegravir (TIVICAY) 50 MG tablet Take 1 tablet (50 mg total) by mouth daily. 05/04/17  Yes Daiva Eves, Lisette Grinder, MD  fluconazole (DIFLUCAN) 200 MG tablet Take 1 tablet (200 mg total) by mouth daily. 05/04/17  Yes Daiva Eves,  Lisette Grinderornelius N, MD  gabapentin (NEURONTIN) 100 MG capsule Take 1 capsule (100 mg total) by mouth 3 (three) times daily. 05/04/17  Yes Daiva EvesVan Dam, Lisette Grinderornelius N, MD  ibuprofen (ADVIL,MOTRIN) 200 MG tablet Take 2,000 mg by mouth 5 (five) times daily as needed (for pain.).    Yes [provider]  meloxicam (MOBIC) 7.5 MG tablet TAKE 1 TABLET BY MOUTH TWICE A DAY IF NEEDED FOR PAIN Patient taking differently: Take 7.5 mg by mouth 3 (three) times daily.  10/16/17  Yes Cristie HemStanbery,  Mary L, PA-C  Misc Natural Products (FOCUSED MIND PO) Take 1 tablet by mouth 3 (three) times daily. FOCUS FACTOR SUPPLEMENT   Yes [provider]  Multiple Vitamin (MULTIVITAMIN WITH MINERALS) TABS tablet Take 1 tablet by mouth 3 (three) times daily. Centrum   Yes [provider]  Prenatal Vit-Fe Fumarate-FA (PRENATAL MULTIVITAMIN) TABS tablet Take 1 tablet by mouth 3 (three) times daily.   Yes [provider]  valACYclovir (VALTREX) 500 MG tablet Take 1 tablet (500 mg total) by mouth daily. 05/04/17  Yes Daiva EvesVan Dam, Lisette Grinderornelius N, MD  amoxicillin-clavulanate (AUGMENTIN) 875-125 MG tablet Take 1 tablet by mouth 2 (two) times daily. Patient not taking: Reported on 10/21/2017 06/30/17   Leroy SeaSingh, Prashant K, MD  clindamycin (CLEOCIN) 300 MG capsule Take 1 capsule (300 mg total) by mouth 3 (three) times daily for 7 days. 11/08/17 11/15/17  Alfio Loescher S, PA-C  dextromethorphan-guaiFENesin (MUCINEX DM) 30-600 MG 12hr tablet Take 1 tablet by mouth 2 (two) times daily. Patient not taking: Reported on 10/21/2017 06/30/17   Leroy SeaSingh, Prashant K, MD  ibuprofen (ADVIL,MOTRIN) 800 MG tablet Take 1 tablet (800 mg total) by mouth every 8 (eight) hours as needed for moderate pain. Patient not taking: Reported on 10/21/2017 06/18/17   Garnette Gunnerhompson, Aaron B, MD    Family History Family History  Problem Relation Age of Onset  . Cancer Mother   . CAD Brother     Social History Social History   Tobacco Use  . Smoking status: Former Games developermoker  . Smokeless tobacco: Current User    Types: Chew  Substance Use Topics  . Alcohol use: Yes    Frequency: Never    Comment: seldom  . Drug use: No     Allergies   Sulfa antibiotics and Tramadol   Review of Systems Review of Systems  Constitutional: Negative for chills and fever.  HENT: Negative for congestion.   Eyes: Negative for visual disturbance.  Respiratory: Positive for shortness of breath (resolved).   Cardiovascular: Positive for chest pain  (resolved). Negative for leg swelling.  Gastrointestinal: Negative for abdominal pain, diarrhea, nausea and vomiting.  Genitourinary: Negative for flank pain.  Musculoskeletal:       BLE pain, wound to left foot/ankle  Skin: Positive for wound.  Neurological: Negative for weakness, numbness and headaches.    Physical Exam Updated Vital Signs BP 112/80 (BP Location: Right Arm)   Pulse (!) 52   Temp 97.8 F (36.6 C) (Oral)   Resp 20   Ht 6\' 1"  (1.854 m)   Wt 94.8 kg   SpO2 100%   BMI 27.57 kg/m   Physical Exam  Constitutional: He appears well-developed and well-nourished. No distress.  nontoxic  HENT:  Head: Normocephalic and atraumatic.  Eyes: Conjunctivae are normal.  Neck: Neck supple.  Cardiovascular: Normal rate, regular rhythm, normal heart sounds and intact distal pulses.  No murmur heard. Pulmonary/Chest: Effort normal and breath sounds normal. No respiratory distress. He has  no wheezes.  Abdominal: Soft. Bowel sounds are normal. He exhibits no distension. There is no tenderness. There is no guarding.  Musculoskeletal: He exhibits no edema.  Neurological: He is alert.  Skin: Skin is warm and dry. Capillary refill takes less than 2 seconds.  2x2cm ulceration to the left medial ankle with mild surrounding erythema. No warmth or drainage noted. Some granulation tissue present. BLE rash pictured below.  Psychiatric: He has a normal mood and affect.  Nursing note and vitals reviewed.      ED Treatments / Results  Labs (all labs ordered are listed, but only abnormal results are displayed) Labs Reviewed  CBC WITH DIFFERENTIAL/PLATELET - Abnormal; Notable for the following components:      Result Value   RDW 15.9 (*)    All other components within normal limits  BASIC METABOLIC PANEL - Abnormal; Notable for the following components:   Chloride 112 (*)    CO2 18 (*)    All other components within normal limits  I-STAT TROPONIN, ED  I-STAT CG4 LACTIC ACID, ED     EKG EKG Interpretation  Date/Time:  Sunday November 08 2017 13:32:11 EDT Ventricular Rate:  43 PR Interval:    QRS Duration: 103 QT Interval:  479 QTC Calculation: 406 R Axis:   45 Text Interpretation:  Sinus bradycardia Anteroseptal infarct, age indeterminate Confirmed by Lorre Nick ([id number]) on 11/08/2017 3:27:52 PM   Radiology Dg Chest 2 View  Result Date: 11/08/2017 CLINICAL DATA:  Left chest pain, shortness of breath EXAM: CHEST - 2 VIEW COMPARISON:  CT chest dated 06/29/2017 FINDINGS: Mild left basilar scarring. No focal consolidation. No pleural effusion or pneumothorax. The heart is normal in size. Visualized osseous structures are within normal limits. IMPRESSION: No evidence of acute cardiopulmonary disease. Electronically Signed   By: Charline Bills M.D.   On: 11/08/2017 13:26    Procedures Procedures (including critical care time)  Medications Ordered in ED Medications - No data to display   Initial Impression / Assessment and Plan / ED Course  I have reviewed the triage vital signs and the nursing notes.  Pertinent labs & imaging results that were available during my care of the patient were reviewed by me and considered in my medical decision making (see chart for details).    Records reviewed.  Pt had echo on 06/17/17 which showed no vegetation. Normal EF and no valvular abnormalities. Do not suspect endocarditis.  Patient denies history of IVDU.  No murmur on exam.  Final Clinical Impressions(s) / ED Diagnoses   Final diagnoses:  Visit for wound check  Chest pain, unspecified type   Patient presenting for multiple complaints including chest pain and encounter for wound check.  Chest pain has not intermittent for last 6 months.  Symptoms sound atypical for ACS or other acute etiology that would require admission or further work-up in the hospital.  EKG with normal sinus rhythm, heart rate 53 with antero-septal infarct age indeterminant.  Repeat ECG with  sinus bradycardia at 43.  EKGs do not appear grossly unchanged from prior.  Chest x-ray is negative for acute cardiopulmonary disease.  CBC without leukocytosis.  BMP with mildly low Bicarb, no other significant abnormalities.  Troponin negative.  Lactic acid negative.  Records reviewed and patient had normal BNP 4 days ago.  With regards to his chest pain, suspect that patient is safe for discharge with close outpatient follow-up given the chronicity of his symptoms and normal work-up today.  With regard to the  wound on his left lower extremity, patient has chronic appearing ulceration to the left medial ankle.  Does not appear significantly cellulitic and patient shows no signs of sepsis.  Do not feel he will require admission for IV antibiotics, given that he is immune compromised and has history of MRSA, will give antibiotic for clindamycin.  Have given referral to wound care and advised patient to follow-up for further treatment.  Also advised him to follow-up with his primary doctor with regards to his chest pain today.  Patient voices understanding plan reasons return immediately to the ED.  All questions answered.  ED Discharge Orders         Ordered    clindamycin (CLEOCIN) 300 MG capsule  3 times daily     11/08/17 [address] Itzamar Traynor S, PA-C 11/08/17 2316    Lorre Nick, MD 11/10/17 1529

## 2017-12-09 ENCOUNTER — Other Ambulatory Visit: Payer: Self-pay | Admitting: Family Medicine

## 2017-12-28 DIAGNOSIS — M87151 Osteonecrosis due to drugs, right femur: Secondary | ICD-10-CM | POA: Diagnosis not present

## 2017-12-28 DIAGNOSIS — Z96642 Presence of left artificial hip joint: Secondary | ICD-10-CM | POA: Diagnosis not present

## 2017-12-28 DIAGNOSIS — M25551 Pain in right hip: Secondary | ICD-10-CM | POA: Diagnosis not present

## 2017-12-28 DIAGNOSIS — M1611 Unilateral primary osteoarthritis, right hip: Secondary | ICD-10-CM | POA: Diagnosis not present

## 2017-12-28 DIAGNOSIS — R079 Chest pain, unspecified: Secondary | ICD-10-CM | POA: Diagnosis not present

## 2017-12-28 DIAGNOSIS — R0789 Other chest pain: Secondary | ICD-10-CM | POA: Diagnosis not present

## 2017-12-30 ENCOUNTER — Other Ambulatory Visit: Payer: Self-pay | Admitting: Orthopedic Surgery

## 2017-12-30 DIAGNOSIS — R079 Chest pain, unspecified: Secondary | ICD-10-CM

## 2017-12-31 ENCOUNTER — Inpatient Hospital Stay: Admission: RE | Admit: 2017-12-31 | Payer: Medicare Other | Source: Ambulatory Visit

## 2018-01-11 ENCOUNTER — Encounter: Payer: Self-pay | Admitting: Family

## 2018-01-11 ENCOUNTER — Ambulatory Visit
Admission: RE | Admit: 2018-01-11 | Discharge: 2018-01-11 | Disposition: A | Discharge status: Home / Self Care | Payer: Medicare Other | Source: Ambulatory Visit | Attending: Orthopedic Surgery | Admitting: Orthopedic Surgery

## 2018-01-11 ENCOUNTER — Ambulatory Visit (INDEPENDENT_AMBULATORY_CARE_PROVIDER_SITE_OTHER): Payer: Medicare Other | Admitting: Family

## 2018-01-11 VITALS — BP 120/78 | HR 62 | Temp 97.6°F | Ht 73.0 in | Wt 209.4 lb

## 2018-01-11 DIAGNOSIS — K2100 Gastro-esophageal reflux disease with esophagitis, without bleeding: Secondary | ICD-10-CM

## 2018-01-11 DIAGNOSIS — R079 Chest pain, unspecified: Secondary | ICD-10-CM | POA: Diagnosis not present

## 2018-01-11 DIAGNOSIS — B20 Human immunodeficiency virus [HIV] disease: Secondary | ICD-10-CM | POA: Diagnosis not present

## 2018-01-11 MED ORDER — GABAPENTIN 600 MG PO TABS: 600.0000 mg | ORAL_TABLET | Freq: Two times a day (BID) | ORAL | 0 refills | Status: DC

## 2018-01-11 MED ORDER — ESOMEPRAZOLE MAGNESIUM 40 MG PO CPDR: 40.0000 mg | DELAYED_RELEASE_CAPSULE | Freq: Every day | ORAL | 2 refills | Status: DC

## 2018-01-11 MED ORDER — IOPAMIDOL (ISOVUE-300) INJECTION 61%
75.0000 mL | Freq: Once | INTRAVENOUS | Status: AC | PRN
  Administered 2018-01-11: 75 mL via INTRAVENOUS

## 2018-01-11 MED ORDER — ALBUTEROL SULFATE HFA 108 (90 BASE) MCG/ACT IN AERS: 2.0000 | INHALATION_SPRAY | Freq: Four times a day (QID) | RESPIRATORY_TRACT | 2 refills | Status: DC | PRN

## 2018-01-11 NOTE — Patient Instructions (Addendum)
Nice to see you.  We will increase your Gabapentin.  We will check your blood work today.   Please continue to take your Symtuza and Tivicay.  Please start taking the Nexium.

## 2018-01-11 NOTE — Progress Notes (Signed)
Subjective:    Patient ID: [name], male    DOB: [birth year], 52 y.o.   MRN: [id number]  Chief Complaint  Patient presents with  . Follow-up    HIV    HPI:  [name] is a 52 y.o. male who presents today for follow up of his HIV disease.  [name] was last seen in the office on 05/04/2017 for routine follow up where there was concern for developing resistance given a history of intermittent adherence with his medication regimen. His antiretroviral regimen consisted of Tivicay and Symtuza. Genotype at the time revealed no resistance. He has not completed any recent blood work.  [name] continues to take his Symtuza and Tivicay as prescribed with no adverse side effects. He has not missed any doses of medication and has no problems obtaining them. Has coverage through Medicare and Medicaid. Continues to have generalized body pain in all extremities and trunk. Will be having surgery in November on his left ankle. Previously prescribed gabapentin has not helped with his symptoms despite taking 2-3 capsules at a time up to 3 times per day. In addition he has been taking about 10,000 mg of ibuprofen due to the pain. Tylenol results in symptoms of GERD which have been poorly controlled. GERD symptoms are worsened at night and when lying down. Generalized pains are described as sharp, dull and achy depending upon the time. Severity is enough that it can take up to 1/2 day to get started.     Allergies  Allergen Reactions  . Sulfa Antibiotics Rash  . Tramadol Nausea Only      Outpatient Medications Prior to Visit  Medication Sig Dispense Refill  . Darunavir-Cobicisctat-Emtricitabine-Tenofovir Alafenamide (SYMTUZA) 800-150-200-10 MG TABS Take 1 tablet by mouth daily with breakfast. (Patient taking differently: Take 1 tablet by mouth daily. ) 30 tablet 5  . dolutegravir (TIVICAY) 50 MG tablet Take 1 tablet (50 mg total) by mouth daily. 30 tablet 5  . doxycycline  (VIBRA-TABS) 100 MG tablet Take 100 mg by mouth 2 (two) times daily.    Marland Kitchen ibuprofen (ADVIL,MOTRIN) 200 MG tablet Take 2,000 mg by mouth 5 (five) times daily as needed (for pain.).     Marland Kitchen meloxicam (MOBIC) 7.5 MG tablet TAKE 1 TABLET BY MOUTH TWICE A DAY IF NEEDED FOR PAIN (Patient taking differently: Take 7.5 mg by mouth 3 (three) times daily. ) 30 tablet 0  . Misc Natural Products (FOCUSED MIND PO) Take 1 tablet by mouth 3 (three) times daily. FOCUS FACTOR SUPPLEMENT    . Multiple Vitamin (MULTIVITAMIN WITH MINERALS) TABS tablet Take 1 tablet by mouth 3 (three) times daily. Centrum    . Prenatal Vit-Fe Fumarate-FA (PRENATAL MULTIVITAMIN) TABS tablet Take 1 tablet by mouth 3 (three) times daily.    . valACYclovir (VALTREX) 500 MG tablet Take 1 tablet (500 mg total) by mouth daily. 30 tablet 11  . albuterol (PROVENTIL HFA;VENTOLIN HFA) 108 (90 Base) MCG/ACT inhaler Inhale 2 puffs into the lungs every 6 (six) hours as needed for wheezing or shortness of breath. 1 Inhaler 2  . amoxicillin-clavulanate (AUGMENTIN) 875-125 MG tablet Take 1 tablet by mouth 2 (two) times daily. 60 tablet 1  . dextromethorphan-guaiFENesin (MUCINEX DM) 30-600 MG 12hr tablet Take 1 tablet by mouth 2 (two) times daily. 30 tablet 0  . fluconazole (DIFLUCAN) 200 MG tablet Take 1 tablet (200 mg total) by mouth daily. 7 tablet 5  . gabapentin (NEURONTIN) 100 MG capsule Take 1 capsule (100 mg  total) by mouth 3 (three) times daily. 30 capsule 11  . ibuprofen (ADVIL,MOTRIN) 800 MG tablet Take 1 tablet (800 mg total) by mouth every 8 (eight) hours as needed for moderate pain. 30 tablet 0   No facility-administered medications prior to visit.      Past Medical History:  Diagnosis Date  . HIV (human immunodeficiency virus infection) (HCC)   . Immune deficiency disorder (HCC)   . MRSA (methicillin resistant staph aureus) culture positive   . Streptococcal pneumonia [facility])       Past Surgical History:  Procedure Laterality Date    . 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 Cancer Mother   . CAD Brother       Social History   Socioeconomic History  . Marital status: Legally Separated    Spouse name: Not on file  . Number of children: Not on file  . Years of education: Not on file  . Highest education level: Not on file  Occupational History  . Not on file  Social Needs  . Financial resource strain: Not on file  . Food insecurity:    Worry: Not on file    Inability: Not on file  . Transportation needs:    Medical: Not on file    Non-medical: Not on file  Tobacco Use  . Smoking status: Former Games developer  . Smokeless tobacco: Current User    Types: Chew  Substance and Sexual Activity  . Alcohol use: Yes    Frequency: Never    Comment: seldom  . Drug use: No  . Sexual activity: Not on file    Comment: declined condoms  Lifestyle  . Physical activity:    Days per week: Not on file    Minutes per session: Not on file  . Stress: Not on file  Relationships  . Social connections:    Talks on phone: Not on file    Gets together: Not on file    Attends religious service: Not on file    Active member of club or organization: Not on file    Attends meetings of clubs or organizations: Not on file    Relationship status: Not on file  . Intimate partner violence:    Fear of current or ex partner: Not on file    Emotionally abused: Not on file    Physically abused: Not on file    Forced sexual activity: Not on file  Other Topics Concern  . Not on file  Social History Narrative  . Not on file   Review of Systems  Constitutional: Negative for appetite change, chills, fatigue, fever and unexpected weight change.  Eyes: Negative for visual disturbance.  Respiratory: Negative for cough, chest tightness, shortness of breath and wheezing.   Cardiovascular: Negative for chest pain and leg  swelling.  Gastrointestinal: Negative for abdominal pain, constipation, diarrhea, nausea and vomiting.  Genitourinary: Negative for dysuria, flank pain, frequency, genital sores, hematuria and urgency.  Musculoskeletal: Positive for arthralgias, back pain, myalgias and neck pain.  Skin: Negative for rash.  Allergic/Immunologic: Negative for immunocompromised state.  Neurological: Negative for dizziness and headaches.       Objective:    BP 120/78   Pulse 62   Temp 97.6 F (36.4 C)   Ht 6\' 1"  (1.854 m)   Wt 209 lb 6.4 oz (95 kg)  BMI 27.63 kg/m  Nursing note and vital signs reviewed.  Physical Exam  Constitutional: He is oriented to person, place, and time. He appears well-developed and well-nourished. No distress.  Cardiovascular: Normal rate, regular rhythm, normal heart sounds and intact distal pulses.  Pulmonary/Chest: Effort normal and breath sounds normal.  Neurological: He is alert and oriented to person, place, and time.  Skin: Skin is warm and dry.  Psychiatric: He has a normal mood and affect. His behavior is normal. Judgment and thought content normal.        Assessment & Plan:   Problem List Items Addressed This Visit      Digestive   GERD (gastroesophageal reflux disease)    [name] has poorly controlled GERD. Restart Nexium. There is concern for gastric ulcer secondary to his large intake of NSAIDs. If symptoms are not improved, he may need referral to gastroenterology for possible EGD.  Discussed importance of foods and triggers.       Relevant Medications   esomeprazole (NEXIUM) 40 MG capsule     Other   HIV (human immunodeficiency virus infection) (HCC) - Primary    [name] was previously well controlled with his antiretroviral therapy with Biktarvy and Tivicay with no adverse side effects. He has no problems obtaining his medication. Does continue to have poorly controlled pain. We discussed the importance of taking medication as prescribed  due to the risk of adverse side effects. Fortunately he has no symptoms of end organ damage or opportunistic infection. Check viral load, CD4 count and CMP today. I will increase his gabapentin and have encouraged acetaminophen in place of ibuprofen. He is not overall accepting of this and I do have concern about his ability to take medications as prescribed. Continue current dose of Tivicay and Biktarvy. Follow up in 1 month or sooner if needed.       Relevant Orders   Comprehensive metabolic panel   CBC (Completed)   RPR   T-helper cell (CD4)- ([facility] clinic only) (Completed)   HIV 1 RNA quant-no reflex-bld      A total of 35 minutes were spent face-to-face with the patient during this encounter and over half of that time was spent on counseling and coordination of care.  We discussed in depth the importance of taking medications as prescribed, concern for risks associated with large doses of medication, and non-pharmacological pain management.    I have discontinued Audric Venn. Kruzel's fluconazole, gabapentin, dextromethorphan-guaiFENesin, and amoxicillin-clavulanate. I am also having him start on esomeprazole and gabapentin. Additionally, I am having him maintain his Darunavir-Cobicisctat-Emtricitabine-Tenofovir Alafenamide, dolutegravir, valACYclovir, meloxicam, ibuprofen, prenatal multivitamin, multivitamin with minerals, Misc Natural Products (FOCUSED MIND PO), doxycycline, and albuterol.   Meds ordered this encounter  Medications  . esomeprazole (NEXIUM) 40 MG capsule    Sig: Take 1 capsule (40 mg total) by mouth daily at 12 noon.    Dispense:  30 capsule    Refill:  2    Order Specific Question:   Supervising Provider    Answer:   Judyann Munson [[id number]]  . gabapentin (NEURONTIN) 600 MG tablet    Sig: Take 1 tablet (600 mg total) by mouth 2 (two) times daily.    Dispense:  60 tablet    Refill:  0    Order Specific Question:   Supervising Provider    Answer:   Judyann Munson  [[id number]]  . albuterol (PROVENTIL HFA;VENTOLIN HFA) 108 (90 Base) MCG/ACT inhaler    Sig: Inhale 2 puffs into the  lungs every 6 (six) hours as needed for wheezing or shortness of breath.    Dispense:  1 Inhaler    Refill:  2    Order Specific Question:   Supervising Provider    Answer:   Judyann Munson [[id number]]     Follow-up: Return if symptoms worsen or fail to improve.    Marcos Eke, MSN, FNP-C Nurse Practitioner [facility], [facility] Office phone: [phone] Pager: [phone] [facility] Main number: [phone]

## 2018-01-12 DIAGNOSIS — B20 Human immunodeficiency virus [HIV] disease: Secondary | ICD-10-CM | POA: Diagnosis not present

## 2018-01-12 LAB — T-HELPER CELL (CD4) - (RCID CLINIC ONLY)

## 2018-01-12 NOTE — Assessment & Plan Note (Signed)
[name] was previously well controlled with his antiretroviral therapy with Biktarvy and Tivicay with no adverse side effects. He has no problems obtaining his medication. Does continue to have poorly controlled pain. We discussed the importance of taking medication as prescribed due to the risk of adverse side effects. Fortunately he has no symptoms of end organ damage or opportunistic infection. Check viral load, CD4 count and CMP today. I will increase his gabapentin and have encouraged acetaminophen in place of ibuprofen. He is not overall accepting of this and I do have concern about his ability to take medications as prescribed. Continue current dose of Tivicay and Biktarvy. Follow up in 1 month or sooner if needed.

## 2018-01-12 NOTE — Assessment & Plan Note (Signed)
[name] has poorly controlled GERD. Restart Nexium. There is concern for gastric ulcer secondary to his large intake of NSAIDs. If symptoms are not improved, he may need referral to gastroenterology for possible EGD.  Discussed importance of foods and triggers.

## 2018-01-13 ENCOUNTER — Ambulatory Visit (INDEPENDENT_AMBULATORY_CARE_PROVIDER_SITE_OTHER): Payer: Medicare Other

## 2018-01-13 DIAGNOSIS — Z23 Encounter for immunization: Secondary | ICD-10-CM

## 2018-01-13 LAB — T-HELPER CELL (CD4) - (RCID CLINIC ONLY)
CD4 T CELL ABS: 270 /uL — AB (ref 400–2700)
CD4 T CELL HELPER: 16 % — AB (ref 33–55)

## 2018-01-14 LAB — COMPREHENSIVE METABOLIC PANEL
AG Ratio: 1.3 (calc) (ref 1.0–2.5)
ALT: 23 U/L (ref 9–46)
AST: 28 U/L (ref 10–35)
Albumin: 4 g/dL (ref 3.6–5.1)
Alkaline phosphatase (APISO): 63 U/L (ref 40–115)
BUN / CREAT RATIO: 13 (calc) (ref 6–22)
BUN: 19 mg/dL (ref 7–25)
CO2: 22 mmol/L (ref 20–32)
CREATININE: 1.48 mg/dL — AB (ref 0.70–1.33)
Calcium: 9.1 mg/dL (ref 8.6–10.3)
Chloride: 111 mmol/L — ABNORMAL HIGH (ref 98–110)
GLUCOSE: 89 mg/dL (ref 65–99)
Globulin: 3.1 g/dL (calc) (ref 1.9–3.7)
Potassium: 4.1 mmol/L (ref 3.5–5.3)
SODIUM: 142 mmol/L (ref 135–146)
TOTAL PROTEIN: 7.1 g/dL (ref 6.1–8.1)
Total Bilirubin: 0.2 mg/dL (ref 0.2–1.2)

## 2018-01-14 LAB — RPR: RPR Ser Ql: NONREACTIVE

## 2018-01-14 LAB — CBC
HEMATOCRIT: 41.4 % (ref 38.5–50.0)
HEMOGLOBIN: 13.9 g/dL (ref 13.2–17.1)
MCH: 30.6 pg (ref 27.0–33.0)
MCHC: 33.6 g/dL (ref 32.0–36.0)
MCV: 91.2 fL (ref 80.0–100.0)
MPV: 10.4 fL (ref 7.5–12.5)
Platelets: 211 10*3/uL (ref 140–400)
RBC: 4.54 10*6/uL (ref 4.20–5.80)
RDW: 12.8 % (ref 11.0–15.0)
WBC: 5.6 10*3/uL (ref 3.8–10.8)

## 2018-01-14 LAB — HIV-1 RNA QUANT-NO REFLEX-BLD
HIV 1 RNA Quant: <20 copies/mL
HIV-1 RNA QUANT, LOG: NOT DETECTED {Log_copies}/mL

## 2018-01-18 ENCOUNTER — Telehealth: Payer: Self-pay | Admitting: *Deleted

## 2018-01-18 NOTE — Telephone Encounter (Signed)
Relayed results to patient, explained about the CD4 error.  Discussed kidney function. Patient had been taking high doses of ibuprofen/mobic previously, but his surgeon has increased his gabapentin since then and [name] has stopped the ibuprofen/mobic. He takes a total of 6 arthritis-strength tylenols (650 mg) per day with his gabapentin. RN cautioned patient to please follow directions on bottle and take only as needed.  He is scheduled to follow up with [name] 11/25, will have surgery 11/27. Andree Coss, RN

## 2018-01-18 NOTE — Telephone Encounter (Signed)
-----   Message from Veryl Speak, FNP sent at 01/15/2018  2:19 PM EDT ----- Please inform [name] that his blood work shows his kidney function is slightly elevated which we will need to monitor. Otherwise, his viral load is undetectable. There was a lab error with his CD4 count that he will not be charged for. Continue to take medication as prescribed and follow up in 1 month as scheduled.

## 2018-01-19 NOTE — Progress Notes (Signed)
Please place orders in Epic as patient is being scheduled for a pre-op appointment! Thank you! 

## 2018-01-25 ENCOUNTER — Telehealth: Payer: Self-pay | Admitting: *Deleted

## 2018-01-25 ENCOUNTER — Other Ambulatory Visit: Payer: Self-pay | Admitting: *Deleted

## 2018-01-25 DIAGNOSIS — B20 Human immunodeficiency virus [HIV] disease: Secondary | ICD-10-CM

## 2018-01-25 MED ORDER — GABAPENTIN 600 MG PO TABS: 1200.0000 mg | ORAL_TABLET | Freq: Two times a day (BID) | ORAL | 0 refills | Status: DC

## 2018-01-25 MED ORDER — DARUN-COBIC-EMTRICIT-TENOFAF 800-150-200-10 MG PO TABS: 1.0000 | ORAL_TABLET | Freq: Every day | ORAL | 3 refills | Status: DC

## 2018-01-25 MED ORDER — DOLUTEGRAVIR SODIUM 50 MG PO TABS: 50.0000 mg | ORAL_TABLET | Freq: Every day | ORAL | 3 refills | Status: DC

## 2018-01-25 NOTE — Patient Instructions (Signed)
[name]  01/25/2018   Your procedure is scheduled on: Tuesday 02/09/2018  Report to [facility] Main  Entrance             Report to admitting at  115  PM    Call this number if you have problems the morning of surgery [phone]    Remember: Do not eat food  :After Midnight. May have clear liquids from 12 midnight up until 0945 am then nothing until after surgery!     CLEAR LIQUID DIET   Foods Allowed                                                                     Foods Excluded  Coffee and tea, regular and decaf                             liquids that you cannot  Plain Jell-O in any flavor                                             see through such as: Fruit ices (not with fruit pulp)                                     milk, soups, orange juice  Iced Popsicles                                    All solid food Carbonated beverages, regular and diet                                    Cranberry, grape and apple juices Sports drinks like Gatorade Lightly seasoned clear broth or consume(fat free) Sugar, honey syrup  Sample Menu Breakfast                                Lunch                                     Supper Cranberry juice                    Beef broth                            Chicken broth Jell-O                                     Grape juice  Apple juice Coffee or tea                        Jell-O                                      Popsicle                                                Coffee or tea                        Coffee or tea  _____________________________________________________________________                BRUSH YOUR TEETH MORNING OF SURGERY AND RINSE YOUR MOUTH OUT, NO CHEWING GUM CANDY OR MINTS.     Take these medicines the morning of surgery with A SIP OF WATER: Gabapentin (Neurontin), Darunavir-Cobicisctat-Emtrictabine-Tenofovir-Alafenamide (Symtaza), Dolutegravir  (Trivicay), Valacyclovir (Valtrex), use Albuterol inhaler if needed and bring inhaler with you to the hospital                                You may not have any metal on your body including hair pins and              piercings  Do not wear jewelry, make-up, lotions, powders or perfumes, deodorant                      Men may shave face and neck.   Do not bring valuables to the hospital. [place] IS NOT             RESPONSIBLE   FOR VALUABLES.  Contacts, dentures or bridgework may not be worn into surgery.  Leave suitcase in the car. After surgery it may be brought to your room.                  Please read over the following fact sheets you were given: _____________________________________________________________________             [facility] - Preparing for Surgery Before surgery, you can play an important role.  Because skin is not sterile, your skin needs to be as free of germs as possible.  You can reduce the number of germs on your skin by washing with CHG (chlorahexidine gluconate) soap before surgery.  CHG is an antiseptic cleaner which kills germs and bonds with the skin to continue killing germs even after washing. Please DO NOT use if you have an allergy to CHG or antibacterial soaps.  If your skin becomes reddened/irritated stop using the CHG and inform your nurse when you arrive at Short Stay. Do not shave (including legs and underarms) for at least 48 hours prior to the first CHG shower.  You may shave your face/neck. Please follow these instructions carefully:  1.  Shower with CHG Soap the night before surgery and the  morning of Surgery.  2.  If you choose to wash your hair, wash your hair first as usual with your  normal  shampoo.  3.  After you shampoo, rinse your hair and body thoroughly to remove the  shampoo.  4.  Use CHG as you would any other liquid soap.  You can apply chg directly  to the skin and wash                       Gently  with a scrungie or clean washcloth.  5.  Apply the CHG Soap to your body ONLY FROM THE NECK DOWN.   Do not use on face/ open                           Wound or open sores. Avoid contact with eyes, ears mouth and genitals (private parts).                       Wash face,  Genitals (private parts) with your normal soap.             6.  Wash thoroughly, paying special attention to the area where your surgery  will be performed.  7.  Thoroughly rinse your body with warm water from the neck down.  8.  DO NOT shower/wash with your normal soap after using and rinsing off  the CHG Soap.                9.  Pat yourself dry with a clean towel.            10.  Wear clean pajamas.            11.  Place clean sheets on your bed the night of your first shower and do not  sleep with pets. Day of Surgery : Do not apply any lotions/deodorants the morning of surgery.  Please wear clean clothes to the hospital/surgery center.  FAILURE TO FOLLOW THESE INSTRUCTIONS MAY RESULT IN THE CANCELLATION OF YOUR SURGERY PATIENT SIGNATURE_________________________________  NURSE SIGNATURE__________________________________  ________________________________________________________________________   [name]  An incentive spirometer is a tool that can help keep your lungs clear and active. This tool measures how well you are filling your lungs with each breath. Taking long deep breaths may help reverse or decrease the chance of developing breathing (pulmonary) problems (especially infection) following:  A long period of time when you are unable to move or be active. BEFORE THE PROCEDURE   If the spirometer includes an indicator to show your best effort, your nurse or respiratory therapist will set it to a desired goal.  If possible, sit up straight or lean slightly forward. Try not to slouch.  Hold the incentive spirometer in an upright position. INSTRUCTIONS FOR USE  1. Sit on the edge of your bed if  possible, or sit up as far as you can in bed or on a chair. 2. Hold the incentive spirometer in an upright position. 3. Breathe out normally. 4. Place the mouthpiece in your mouth and seal your lips tightly around it. 5. Breathe in slowly and as deeply as possible, raising the piston or the ball toward the top of the column. 6. Hold your breath for 3-5 seconds or for as long as possible. Allow the piston or ball to fall to the bottom of the column. 7. Remove the mouthpiece from your mouth and breathe out normally. 8. Rest for a few seconds and repeat Steps 1 through 7 at least 10 times every 1-2 hours when you are awake. Take your time and take a few normal breaths between deep breaths. 9. The spirometer may include an indicator to  show your best effort. Use the indicator as a goal to work toward during each repetition. 10. After each set of 10 deep breaths, practice coughing to be sure your lungs are clear. If you have an incision (the cut made at the time of surgery), support your incision when coughing by placing a pillow or rolled up towels firmly against it. Once you are able to get out of bed, walk around indoors and cough well. You may stop using the incentive spirometer when instructed by your caregiver.  RISKS AND COMPLICATIONS  Take your time so you do not get dizzy or light-headed.  If you are in pain, you may need to take or ask for pain medication before doing incentive spirometry. It is harder to take a deep breath if you are having pain. AFTER USE  Rest and breathe slowly and easily.  It can be helpful to keep track of a log of your progress. Your caregiver can provide you with a simple table to help with this. If you are using the spirometer at home, follow these instructions: [place] IF:   You are having difficultly using the spirometer.  You have trouble using the spirometer as often as instructed.  Your pain medication is not giving enough relief while using  the spirometer.  You develop fever of 100.5 F (38.1 C) or higher. SEEK IMMEDIATE MEDICAL CARE IF:   You cough up bloody sputum that had not been present before.  You develop fever of 102 F (38.9 C) or greater.  You develop worsening pain at or near the incision site. MAKE SURE YOU:   Understand these instructions.  Will watch your condition.  Will get help right away if you are not doing well or get worse. Document Released: 07/14/2006 Document Revised: 05/26/2011 Document Reviewed: 09/14/2006 [facility] Patient Information 2014 [place], [place].   ________________________________________________________________________

## 2018-01-25 NOTE — Telephone Encounter (Signed)
Patient called for refills of Symtuza, Tivicay to be sent to [facility] on [address]. He is also asking for refill of gabapentin. He was prescribed 600 mg #60 on 10/28. He states he has been taking 4-6 pills/day, states he was told the #60 was "just to see how it works" and could be refilled any time. Please advise. Andree Coss, RN

## 2018-01-25 NOTE — Telephone Encounter (Signed)
Relayed your instructions to patient. He verbalized understanding.

## 2018-01-25 NOTE — Progress Notes (Signed)
01/12/2018- noted in Epic- labs- CBC, CMP, RPR, HIV1 RNA quant.  01/11/2018- noted in Epic-CT chest w/contrast  11/08/2017- noted in Epic-CXR 2 view and EKG  06/17/2017- noted in Epic-ECHO

## 2018-01-25 NOTE — Addendum Note (Signed)
Addended by: Jeanine Luz D on: 01/25/2018 01:45 PM   Modules accepted: Orders

## 2018-01-25 NOTE — Telephone Encounter (Signed)
A new prescription for gabapentin was sent to the pharmacy. No additional refills will be provided if he goes above the prescribed dosage.

## 2018-01-25 NOTE — Progress Notes (Signed)
Will send orders in phone note. Andree Coss, RN

## 2018-01-29 ENCOUNTER — Other Ambulatory Visit: Payer: Self-pay

## 2018-01-29 ENCOUNTER — Encounter (HOSPITAL_COMMUNITY)
Admission: RE | Admit: 2018-01-29 | Discharge: 2018-01-29 | Disposition: A | Discharge status: Home / Self Care | Payer: Medicare Other | Source: Ambulatory Visit | Attending: Orthopedic Surgery | Admitting: Orthopedic Surgery

## 2018-01-29 ENCOUNTER — Encounter (HOSPITAL_COMMUNITY): Payer: Self-pay

## 2018-01-29 DIAGNOSIS — Z01812 Encounter for preprocedural laboratory examination: Secondary | ICD-10-CM | POA: Diagnosis not present

## 2018-01-29 HISTORY — DX: Depression, unspecified: F32.A

## 2018-01-29 HISTORY — DX: Unspecified asthma, uncomplicated: J45.909

## 2018-01-29 HISTORY — DX: Unspecified osteoarthritis, unspecified site: M19.90

## 2018-01-29 HISTORY — DX: Anxiety disorder, unspecified: F41.9

## 2018-01-29 HISTORY — DX: Major depressive disorder, single episode, unspecified: F32.9

## 2018-01-29 LAB — SURGICAL PCR SCREEN
MRSA, PCR: NEGATIVE
STAPHYLOCOCCUS AUREUS: NEGATIVE

## 2018-01-29 LAB — BASIC METABOLIC PANEL
Anion gap: 7 (ref 5–15)
BUN: 23 mg/dL — AB (ref 6–20)
CHLORIDE: 112 mmol/L — AB (ref 98–111)
CO2: 21 mmol/L — AB (ref 22–32)
CREATININE: 1.38 mg/dL — AB (ref 0.61–1.24)
Calcium: 8.8 mg/dL — ABNORMAL LOW (ref 8.9–10.3)
GFR calc Af Amer: >60 mL/min (ref >60)
GFR calc non Af Amer: 58 mL/min — ABNORMAL LOW (ref >60)
Glucose, Bld: 94 mg/dL (ref 70–99)
POTASSIUM: 3.9 mmol/L (ref 3.5–5.1)
Sodium: 140 mmol/L (ref 135–145)

## 2018-01-29 LAB — TYPE AND SCREEN
ABO/RH(D): A POS
ANTIBODY SCREEN: NEGATIVE

## 2018-01-29 LAB — ABO/RH: ABO/RH(D): A POS

## 2018-02-03 ENCOUNTER — Other Ambulatory Visit (HOSPITAL_COMMUNITY): Payer: Medicare Other

## 2018-02-05 NOTE — H&P (Signed)
TOTAL HIP ADMISSION H&P  Patient is admitted for right total hip arthroplasty, anterior approach.  Subjective:  Chief Complaint:    Right hip primary OA / pain  HPI: [name], 52 y.o. male, has a history of pain and functional disability in the right hip(s) due to arthritis and patient has failed non-surgical conservative treatments for greater than 12 weeks to include NSAID's and/or analgesics, use of assistive devices and activity modification.  Onset of symptoms was gradual starting 19 years ago with gradually worsening course since that time.The patient noted prior procedures of the hip to include arthroplasty on the left hip in 2015.  Patient currently rates pain in the right hip at 10 out of 10 with activity. Patient has night pain, worsening of pain with activity and weight bearing, trendelenberg gait, pain that interfers with activities of daily living and pain with passive range of motion. Patient has evidence of periarticular osteophytes and joint space narrowing by imaging studies. This condition presents safety issues increasing the risk of falls.   There is no current active infection.  Risks, benefits and expectations were discussed with the patient.  Risks including but not limited to the risk of anesthesia, blood clots, nerve damage, blood vessel damage, failure of the prosthesis, infection and up to and including death.  Patient understand the risks, benefits and expectations and wishes to proceed with surgery.   PCP: Department, [facility]  D/C Plans:       Home   Post-op Meds:       No Rx given  Tranexamic Acid:      To be given - IV   Decadron:      Is to be given  FYI:     Xarelto  Norco  DME:   Pt already has equipment   PT:   No PT     Patient Active Problem List   Diagnosis Date Noted  . Essential hypertension   . Lung abscess (HCC) 06/21/2017  . Bacteremia   . Lung nodule, multiple 06/14/2017  . Abscess of left lung with pneumonia (HCC)  06/14/2017  . Sepsis (HCC)   . Hypokalemia   . Asthma 04/22/2017  . History of Clostridium difficile colitis 04/22/2017  . Infectious folliculitis 01/30/2017  . Folliculitis 01/30/2017  . Complication of implanted device 07/24/2015  . 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 GERD (gastroesophageal reflux disease) 01/25/2015  . Hypotension 01/25/2015  . HIV (human immunodeficiency virus infection) (HCC) 01/24/2015  . Primary osteoarthritis of both hips 03/29/2013  . History of pulmonary embolus (PE) 03/17/2013  . 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 Anxiety   . Arthritis   . Asthma   . Depression   . HIV (human immunodeficiency virus infection) (HCC)   . Immune deficiency disorder (HCC)   . MRSA (methicillin resistant staph aureus) culture positive   . Streptococcal pneumonia [facility])     Past Surgical History:  Procedure Laterality Date  . hip arthroplasty Left 2016   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, I&D with replacement in 2017    No current facility-administered medications for this encounter.    Current Outpatient Medications  Medication Sig Dispense Refill Last Dose  . albuterol (PROVENTIL HFA;VENTOLIN HFA) 108 (90 Base) MCG/ACT inhaler Inhale 2 puffs into the lungs every 6 (six) hours as needed for wheezing or  shortness of breath. 1 Inhaler 2   . Darunavir-Cobicisctat-Emtricitabine-Tenofovir Alafenamide (SYMTUZA) 800-150-200-10 MG TABS Take 1 tablet by mouth daily with breakfast. 30 tablet 3   . dolutegravir (TIVICAY) 50 MG tablet Take 1 tablet (50 mg total) by mouth daily. 30 tablet 3   . esomeprazole (NEXIUM) 40 MG capsule Take 1 capsule (40 mg total) by mouth daily at 12 noon. (Patient taking differently: Take 40 mg by mouth daily as needed (heartburn). ) 30 capsule 2   . gabapentin (NEURONTIN) 600 MG  tablet Take 2 tablets (1,200 mg total) by mouth 2 (two) times daily. (Patient taking differently: Take 600 mg by mouth 4 (four) times daily. ) 60 tablet 0   . Multiple Vitamin (MULTIVITAMIN WITH MINERALS) TABS tablet Take 1 tablet by mouth 2 (two) times daily. Centrum    Taking  . Multiple Vitamins-Minerals (MENS MULTIVITAMIN PLUS PO) Take 1 tablet by mouth 2 (two) times daily.     . Prenatal Vit-Fe Fumarate-FA (PRENATAL MULTIVITAMIN) TABS tablet Take 1 tablet by mouth 2 (two) times daily.    Taking  . meloxicam (MOBIC) 7.5 MG tablet TAKE 1 TABLET BY MOUTH TWICE A DAY IF NEEDED FOR PAIN (Patient not taking: No sig reported) 30 tablet 0 Not Taking at Unknown time  . valACYclovir (VALTREX) 500 MG tablet Take 1 tablet (500 mg total) by mouth daily. 30 tablet 11 Not Taking at Unknown time   Allergies  Allergen Reactions  . Sulfa Antibiotics Rash  . Tramadol Nausea Only    Social History   Tobacco Use  . Smoking status: Former Smoker    Types: Cigarettes  . Smokeless tobacco: Current User    Types: Chew  Substance Use Topics  . Alcohol use: Yes    Frequency: Never    Comment: seldom    Family History  Problem Relation Age of Onset  . Cancer Mother   . CAD Brother      Review of Systems  Constitutional: Negative.   HENT: Negative.   Eyes: Negative.   Respiratory: Negative.   Cardiovascular: Negative.   Gastrointestinal: Positive for heartburn.  Genitourinary: Negative.   Musculoskeletal: Positive for joint pain.  Skin: Negative.   Neurological: Negative.   Endo/Heme/Allergies: Negative.   Psychiatric/Behavioral: Positive for depression. The patient is nervous/anxious.     Objective:  Physical Exam  Constitutional: He is oriented to person, place, and time. He appears well-developed.  HENT:  Head: Normocephalic.  Eyes: Pupils are equal, round, and reactive to light.  Neck: Neck supple. No JVD present. No tracheal deviation present. No thyromegaly present.   Cardiovascular: Normal rate, regular rhythm and intact distal pulses.  Respiratory: Effort normal and breath sounds normal. No respiratory distress. He has no wheezes.  GI: Soft. There is no tenderness. There is no guarding.  Musculoskeletal:       Right hip: He exhibits decreased range of motion, decreased strength, tenderness and bony tenderness. He exhibits no swelling, no deformity and no laceration.  Lymphadenopathy:    He has no cervical adenopathy.  Neurological: He is alert and oriented to person, place, and time. A sensory deficit (bilateral LE neuropathy) is present.  Skin: Skin is warm and dry.  Psychiatric: He has a normal mood and affect.      Labs:  Estimated body mass index is 27.84 kg/m as calculated from the following:   Height as of 01/29/18: 6\' 1"  (1.854 m).   Weight as of 01/29/18: 95.7 kg.   Imaging Review Plain radiographs demonstrate  severe degenerative joint disease of the right hip. The bone quality appears to be good for age and reported activity level.    Preoperative templating of the joint replacement has been completed, documented, and submitted to the Operating Room personnel in order to optimize intra-operative equipment management.     Assessment/Plan:  End stage arthritis, right hip  The patient history, physical examination, clinical judgement of the provider and imaging studies are consistent with end stage degenerative joint disease of the right hip and total hip arthroplasty is deemed medically necessary. The treatment options including medical management, injection therapy, arthroscopy and arthroplasty were discussed at length. The risks and benefits of total hip arthroplasty were presented and reviewed. The risks due to aseptic loosening, infection, stiffness, dislocation/subluxation,  thromboembolic complications and other imponderables were discussed.  The patient acknowledged the explanation, agreed to proceed with the plan and consent  was signed. Patient is being admitted for inpatient treatment for surgery, pain control, PT, OT, prophylactic antibiotics, VTE prophylaxis, progressive ambulation and ADL's and discharge planning.The patient is planning to be discharged home.     Anastasio Auerbach Kyara Boxer   PA-C  02/05/2018, 1:23 PM

## 2018-02-08 ENCOUNTER — Encounter: Payer: Self-pay | Admitting: Family

## 2018-02-08 ENCOUNTER — Ambulatory Visit (INDEPENDENT_AMBULATORY_CARE_PROVIDER_SITE_OTHER): Payer: Medicare Other | Admitting: Family

## 2018-02-08 VITALS — BP 126/85 | HR 85 | Wt 219.0 lb

## 2018-02-08 DIAGNOSIS — M160 Bilateral primary osteoarthritis of hip: Secondary | ICD-10-CM | POA: Diagnosis not present

## 2018-02-08 DIAGNOSIS — Z21 Asymptomatic human immunodeficiency virus [HIV] infection status: Secondary | ICD-10-CM

## 2018-02-08 NOTE — Assessment & Plan Note (Signed)
[name] remains undetectable with a CD count of 270. He continues to take the [place] and Tivicay without adverse side effects. From an ID standpoint he is certainly cleared to have his total hip replacement. Continue current dose of Tivicay and Symtuza with follow up in 6 weeks

## 2018-02-08 NOTE — Progress Notes (Signed)
Subjective:    Patient ID: JEYREN DANOWSKI, male    DOB: [birth year], 52 y.o.   MRN: [id number]  Chief Complaint  Patient presents with  . HIV Positive/AIDS  . Osteoarthritis     HPI:  GURNOOR URSUA is a 52 y.o. male who presents today for follow up of right hip pain.  1.) Right hip pain - Mr. Kimberlin has been taking the gabapentin and has decreased his ibuprofen usage. Gabapentin is enough to take the edge off. Decrease ibuprofen has also led to improvement in his reflux. Scheduled for surgery with Dr. Charlann Boxer tomorrow.   2.) HIV disease - Mr. Vonstein continues to take his Symtuza and Tivicay as prescribed with no adverse side effects. He has not missed any doses and has no problems obtaining his medication. Most recent viral load was undetectable with CD4 count of 270.   Allergies  Allergen Reactions  . Sulfa Antibiotics Rash  . Tramadol Nausea Only      Outpatient Medications Prior to Visit  Medication Sig Dispense Refill  . albuterol (PROVENTIL HFA;VENTOLIN HFA) 108 (90 Base) MCG/ACT inhaler Inhale 2 puffs into the lungs every 6 (six) hours as needed for wheezing or shortness of breath. 1 Inhaler 2  . Darunavir-Cobicisctat-Emtricitabine-Tenofovir Alafenamide (SYMTUZA) 800-150-200-10 MG TABS Take 1 tablet by mouth daily with breakfast. 30 tablet 3  . dolutegravir (TIVICAY) 50 MG tablet Take 1 tablet (50 mg total) by mouth daily. 30 tablet 3  . esomeprazole (NEXIUM) 40 MG capsule Take 1 capsule (40 mg total) by mouth daily at 12 noon. (Patient taking differently: Take 40 mg by mouth daily as needed (heartburn). ) 30 capsule 2  . gabapentin (NEURONTIN) 600 MG tablet Take 2 tablets (1,200 mg total) by mouth 2 (two) times daily. (Patient taking differently: Take 600 mg by mouth 4 (four) times daily. ) 60 tablet 0  . Multiple Vitamin (MULTIVITAMIN WITH MINERALS) TABS tablet Take 1 tablet by mouth 2 (two) times daily. Centrum     . Multiple Vitamins-Minerals (MENS MULTIVITAMIN PLUS  PO) Take 1 tablet by mouth 2 (two) times daily.    . Prenatal Vit-Fe Fumarate-FA (PRENATAL MULTIVITAMIN) TABS tablet Take 1 tablet by mouth 2 (two) times daily.     . valACYclovir (VALTREX) 500 MG tablet Take 1 tablet (500 mg total) by mouth daily. 30 tablet 11  . meloxicam (MOBIC) 7.5 MG tablet TAKE 1 TABLET BY MOUTH TWICE A DAY IF NEEDED FOR PAIN (Patient not taking: No sig reported) 30 tablet 0   No facility-administered medications prior to visit.      Past Medical History:  Diagnosis Date  . Anxiety   . Arthritis   . Asthma   . Depression   . HIV (human immunodeficiency virus infection) (HCC)   . Immune deficiency disorder (HCC)   . MRSA (methicillin resistant staph aureus) culture positive   . Streptococcal pneumonia [facility])      Past Surgical History:  Procedure Laterality Date  . hip arthroplasty Left 2016   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, I&D with replacement in 2017    Review of Systems  Constitutional: Negative for appetite change, chills, fatigue, fever and unexpected weight change.  Eyes: Negative for visual disturbance.  Respiratory: Negative for cough, chest tightness, shortness of breath and wheezing.   Cardiovascular: Negative for chest pain and leg swelling.  Gastrointestinal: Negative for abdominal pain, constipation, diarrhea, nausea and vomiting.  Genitourinary: Negative for dysuria, flank pain, frequency, genital sores, hematuria and urgency.  Musculoskeletal:  Positive for right hip pain.   Skin: Negative for rash.  Allergic/Immunologic: Negative for immunocompromised state.  Neurological: Negative for dizziness and headaches.      Objective:    BP 126/85   Pulse 85   Wt 219 lb (99.3 kg)   BMI 28.89 kg/m  Nursing note and vital signs reviewed.  Physical Exam  Constitutional: He is oriented to person, place, and time. He appears well-developed and well-nourished. No distress.  Seated in the chair; pleasant.   Cardiovascular: Normal  rate, regular rhythm, normal heart sounds and intact distal pulses. Exam reveals no gallop and no friction rub.  No murmur heard. Pulmonary/Chest: Effort normal and breath sounds normal. No stridor. No respiratory distress. He has no wheezes. He has no rales.  Abdominal: Soft. Bowel sounds are normal. He exhibits no distension and no mass. There is no tenderness. There is no guarding.  Neurological: He is alert and oriented to person, place, and time.  Skin: Skin is warm and dry.  Psychiatric: He has a normal mood and affect.       Assessment & Plan:   Problem List Items Addressed This Visit      Musculoskeletal and Integument   Primary osteoarthritis of both hips    Scheduled for surgery with Dr. Charlann Boxerlin tomorrow. Gabapentin has helped decrease his pain as well as ibuprofen usage. He is hoping to decrease all medications following surgery. Received form today from patient regarding surgical clearance. Will complete and fax most recent labs to Dr. Nilsa Nuttinglin's office which are also available in Epic.         Other   HIV (human immunodeficiency virus infection) [facility](HCC)    Mr. Hulan FrayUpchurch remains undetectable with a CD count of 270. He continues to take the [place] and Tivicay without adverse side effects. From an ID standpoint he is certainly cleared to have his total hip replacement. Continue current dose of Tivicay and Symtuza with follow up in 6 weeks       Other Visit Diagnoses    Human immunodeficiency virus (HIV) disease (HCC)    -  Primary   Relevant Orders   T-helper cell (CD4)- ([facility] clinic only)   HIV-1 RNA quant-no reflex-bld   RPR       I am having Theophilus KindsLarry D. Segers maintain his valACYclovir, meloxicam, prenatal multivitamin, multivitamin with minerals, esomeprazole, albuterol, dolutegravir, Darunavir-Cobicisctat-Emtricitabine-Tenofovir Alafenamide, gabapentin, and Multiple Vitamins-Minerals (MENS MULTIVITAMIN PLUS PO).   Follow-up: Return in about 6 weeks (around 03/22/2018), or if  symptoms worsen or fail to improve.   Marcos EkeGreg Calone, MSN, FNP-C Nurse Practitioner [facility] Office phone: [phone] Pager: [phone] [facility] Main number: [phone]

## 2018-02-08 NOTE — Patient Instructions (Signed)
Good to see you.   Good luck with your surgery tomorrow.  Plan for office follow up in 6 weeks or sooner if needed.  Blood work in 4 weeks.

## 2018-02-08 NOTE — Assessment & Plan Note (Signed)
Scheduled for surgery with Dr. Charlann Boxerlin tomorrow. Gabapentin has helped decrease his pain as well as ibuprofen usage. He is hoping to decrease all medications following surgery. Received form today from patient regarding surgical clearance. Will complete and fax most recent labs to Dr. Nilsa Nuttinglin's office which are also available in Epic.

## 2018-02-09 ENCOUNTER — Encounter (HOSPITAL_COMMUNITY): Payer: Self-pay | Admitting: *Deleted

## 2018-02-09 ENCOUNTER — Inpatient Hospital Stay (HOSPITAL_COMMUNITY): Payer: Medicare Other | Admitting: Anesthesiology

## 2018-02-09 ENCOUNTER — Telehealth (HOSPITAL_COMMUNITY): Payer: Self-pay | Admitting: *Deleted

## 2018-02-09 ENCOUNTER — Inpatient Hospital Stay (HOSPITAL_COMMUNITY): Payer: Medicare Other

## 2018-02-09 ENCOUNTER — Inpatient Hospital Stay (HOSPITAL_COMMUNITY)
Admission: RE | Admit: 2018-02-09 | Discharge: 2018-02-11 | DRG: 470 | Disposition: A | Discharge status: Home / Self Care | Payer: Medicare Other | Attending: Orthopedic Surgery | Admitting: Orthopedic Surgery

## 2018-02-09 ENCOUNTER — Encounter (HOSPITAL_COMMUNITY)
Admission: RE | Disposition: A | Discharge status: Home / Self Care | Payer: Self-pay | Source: Home / Self Care | Attending: Orthopedic Surgery

## 2018-02-09 DIAGNOSIS — Z8249 Family history of ischemic heart disease and other diseases of the circulatory system: Secondary | ICD-10-CM | POA: Diagnosis not present

## 2018-02-09 DIAGNOSIS — Z86711 Personal history of pulmonary embolism: Secondary | ICD-10-CM

## 2018-02-09 DIAGNOSIS — M87051 Idiopathic aseptic necrosis of right femur: Secondary | ICD-10-CM | POA: Diagnosis not present

## 2018-02-09 DIAGNOSIS — Z471 Aftercare following joint replacement surgery: Secondary | ICD-10-CM | POA: Diagnosis not present

## 2018-02-09 DIAGNOSIS — M25751 Osteophyte, right hip: Secondary | ICD-10-CM | POA: Diagnosis not present

## 2018-02-09 DIAGNOSIS — I10 Essential (primary) hypertension: Secondary | ICD-10-CM | POA: Diagnosis present

## 2018-02-09 DIAGNOSIS — Z419 Encounter for procedure for purposes other than remedying health state, unspecified: Secondary | ICD-10-CM

## 2018-02-09 DIAGNOSIS — Z96649 Presence of unspecified artificial hip joint: Secondary | ICD-10-CM

## 2018-02-09 DIAGNOSIS — Z21 Asymptomatic human immunodeficiency virus [HIV] infection status: Secondary | ICD-10-CM | POA: Diagnosis present

## 2018-02-09 DIAGNOSIS — Z96641 Presence of right artificial hip joint: Secondary | ICD-10-CM | POA: Diagnosis not present

## 2018-02-09 DIAGNOSIS — E663 Overweight: Secondary | ICD-10-CM | POA: Diagnosis present

## 2018-02-09 DIAGNOSIS — Z8619 Personal history of other infectious and parasitic diseases: Secondary | ICD-10-CM | POA: Diagnosis not present

## 2018-02-09 DIAGNOSIS — M879 Osteonecrosis, unspecified: Secondary | ICD-10-CM | POA: Diagnosis not present

## 2018-02-09 DIAGNOSIS — Z8614 Personal history of Methicillin resistant Staphylococcus aureus infection: Secondary | ICD-10-CM | POA: Diagnosis not present

## 2018-02-09 DIAGNOSIS — J45909 Unspecified asthma, uncomplicated: Secondary | ICD-10-CM | POA: Diagnosis present

## 2018-02-09 DIAGNOSIS — K219 Gastro-esophageal reflux disease without esophagitis: Secondary | ICD-10-CM | POA: Diagnosis present

## 2018-02-09 DIAGNOSIS — F17210 Nicotine dependence, cigarettes, uncomplicated: Secondary | ICD-10-CM | POA: Diagnosis not present

## 2018-02-09 DIAGNOSIS — Z809 Family history of malignant neoplasm, unspecified: Secondary | ICD-10-CM | POA: Diagnosis not present

## 2018-02-09 DIAGNOSIS — M1611 Unilateral primary osteoarthritis, right hip: Secondary | ICD-10-CM | POA: Diagnosis not present

## 2018-02-09 DIAGNOSIS — Z886 Allergy status to analgesic agent status: Secondary | ICD-10-CM

## 2018-02-09 DIAGNOSIS — Z96642 Presence of left artificial hip joint: Secondary | ICD-10-CM | POA: Diagnosis not present

## 2018-02-09 DIAGNOSIS — Z6828 Body mass index (BMI) 28.0-28.9, adult: Secondary | ICD-10-CM

## 2018-02-09 DIAGNOSIS — R471 Dysarthria and anarthria: Secondary | ICD-10-CM | POA: Diagnosis not present

## 2018-02-09 DIAGNOSIS — Z882 Allergy status to sulfonamides status: Secondary | ICD-10-CM | POA: Diagnosis not present

## 2018-02-09 DIAGNOSIS — M160 Bilateral primary osteoarthritis of hip: Secondary | ICD-10-CM | POA: Diagnosis not present

## 2018-02-09 HISTORY — PX: TOTAL HIP ARTHROPLASTY: SHX124

## 2018-02-09 SURGERY — ARTHROPLASTY, HIP, TOTAL, ANTERIOR APPROACH
Anesthesia: Spinal | Site: Hip | Laterality: Right

## 2018-02-09 MED ORDER — ONDANSETRON HCL 4 MG PO TABS
4.0000 mg | ORAL_TABLET | Freq: Four times a day (QID) | ORAL | Status: DC | PRN
  Filled 2018-02-09: qty 1

## 2018-02-09 MED ORDER — ONDANSETRON HCL 4 MG/2ML IJ SOLN
INTRAMUSCULAR | Status: DC | PRN
  Administered 2018-02-09: 4 mg via INTRAVENOUS

## 2018-02-09 MED ORDER — CEFAZOLIN SODIUM-DEXTROSE 2-4 GM/100ML-% IV SOLN
2.0000 g | Freq: Four times a day (QID) | INTRAVENOUS | Status: AC
  Administered 2018-02-09 – 2018-02-10 (×2): 2 g via INTRAVENOUS
  Filled 2018-02-09 (×2): qty 100

## 2018-02-09 MED ORDER — RIVAROXABAN 10 MG PO TABS: 10.0000 mg | ORAL_TABLET | ORAL | Status: DC

## 2018-02-09 MED ORDER — STERILE WATER FOR IRRIGATION IR SOLN
Status: DC | PRN
  Administered 2018-02-09: 2000 mL

## 2018-02-09 MED ORDER — METOCLOPRAMIDE HCL 5 MG/ML IJ SOLN: 5.0000 mg | Freq: Three times a day (TID) | INTRAMUSCULAR | Status: DC | PRN

## 2018-02-09 MED ORDER — POLYETHYLENE GLYCOL 3350 17 G PO PACK
17.0000 g | PACK | Freq: Two times a day (BID) | ORAL | Status: DC
  Administered 2018-02-09 – 2018-02-11 (×3): 17 g via ORAL
  Filled 2018-02-09 (×3): qty 1

## 2018-02-09 MED ORDER — NON FORMULARY: 40.0000 mg | Freq: Every day | Status: DC | PRN

## 2018-02-09 MED ORDER — EPHEDRINE 5 MG/ML INJ
INTRAVENOUS | Status: AC
  Filled 2018-02-09: qty 10

## 2018-02-09 MED ORDER — CHLORHEXIDINE GLUCONATE 4 % EX LIQD: 60.0000 mL | Freq: Once | CUTANEOUS | Status: DC

## 2018-02-09 MED ORDER — FENTANYL CITRATE (PF) 100 MCG/2ML IJ SOLN: 50.0000 ug | Freq: Once | INTRAMUSCULAR | Status: DC

## 2018-02-09 MED ORDER — ALBUTEROL SULFATE (2.5 MG/3ML) 0.083% IN NEBU: 2.5000 mg | INHALATION_SOLUTION | Freq: Four times a day (QID) | RESPIRATORY_TRACT | Status: DC | PRN

## 2018-02-09 MED ORDER — LACTATED RINGERS IV SOLN
INTRAVENOUS | Status: DC
  Administered 2018-02-09: 15:00:00 via INTRAVENOUS

## 2018-02-09 MED ORDER — METHOCARBAMOL 500 MG PO TABS: 500.0000 mg | ORAL_TABLET | Freq: Four times a day (QID) | ORAL | 0 refills | Status: DC | PRN

## 2018-02-09 MED ORDER — HYDROMORPHONE HCL 1 MG/ML IJ SOLN: 0.2500 mg | INTRAMUSCULAR | Status: DC | PRN

## 2018-02-09 MED ORDER — HYDROCODONE-ACETAMINOPHEN 7.5-325 MG PO TABS: 1.0000 | ORAL_TABLET | ORAL | Status: DC | PRN

## 2018-02-09 MED ORDER — FENTANYL CITRATE (PF) 100 MCG/2ML IJ SOLN
INTRAMUSCULAR | Status: AC
  Filled 2018-02-09: qty 2

## 2018-02-09 MED ORDER — DIPHENHYDRAMINE HCL 12.5 MG/5ML PO ELIX: 12.5000 mg | ORAL_SOLUTION | ORAL | Status: DC | PRN

## 2018-02-09 MED ORDER — ONDANSETRON HCL 4 MG/2ML IJ SOLN
4.0000 mg | Freq: Four times a day (QID) | INTRAMUSCULAR | Status: DC | PRN
  Administered 2018-02-09: 4 mg via INTRAVENOUS
  Filled 2018-02-09: qty 2

## 2018-02-09 MED ORDER — DOLUTEGRAVIR SODIUM 50 MG PO TABS
50.0000 mg | ORAL_TABLET | Freq: Every day | ORAL | Status: DC
  Administered 2018-02-10 – 2018-02-11 (×2): 50 mg via ORAL
  Filled 2018-02-09 (×2): qty 1

## 2018-02-09 MED ORDER — OXYCODONE HCL 5 MG PO TABS
5.0000 mg | ORAL_TABLET | ORAL | Status: DC | PRN
  Administered 2018-02-09: 5 mg via ORAL
  Administered 2018-02-10 (×2): 10 mg via ORAL
  Filled 2018-02-09 (×2): qty 2
  Filled 2018-02-09: qty 1

## 2018-02-09 MED ORDER — SODIUM CHLORIDE 0.9 % IV SOLN
INTRAVENOUS | Status: DC
  Administered 2018-02-09 – 2018-02-10 (×2): via INTRAVENOUS

## 2018-02-09 MED ORDER — LIDOCAINE 2% (20 MG/ML) 5 ML SYRINGE
INTRAMUSCULAR | Status: AC
  Filled 2018-02-09: qty 5

## 2018-02-09 MED ORDER — METOCLOPRAMIDE HCL 5 MG PO TABS: 5.0000 mg | ORAL_TABLET | Freq: Three times a day (TID) | ORAL | Status: DC | PRN

## 2018-02-09 MED ORDER — ALBUTEROL SULFATE HFA 108 (90 BASE) MCG/ACT IN AERS: 2.0000 | INHALATION_SPRAY | Freq: Four times a day (QID) | RESPIRATORY_TRACT | Status: DC | PRN

## 2018-02-09 MED ORDER — PHENOL 1.4 % MT LIQD
1.0000 | OROMUCOSAL | Status: DC | PRN
  Filled 2018-02-09: qty 177

## 2018-02-09 MED ORDER — DEXAMETHASONE SODIUM PHOSPHATE 10 MG/ML IJ SOLN
10.0000 mg | Freq: Once | INTRAMUSCULAR | Status: AC
  Administered 2018-02-09: 10 mg via INTRAVENOUS

## 2018-02-09 MED ORDER — ALUM & MAG HYDROXIDE-SIMETH 200-200-20 MG/5ML PO SUSP
15.0000 mL | ORAL | Status: DC | PRN
  Administered 2018-02-10: 15 mL via ORAL
  Filled 2018-02-09: qty 30

## 2018-02-09 MED ORDER — PROPOFOL 10 MG/ML IV BOLUS
INTRAVENOUS | Status: DC | PRN
  Administered 2018-02-09: 10 mg via INTRAVENOUS

## 2018-02-09 MED ORDER — FERROUS SULFATE 325 (65 FE) MG PO TABS
325.0000 mg | ORAL_TABLET | Freq: Three times a day (TID) | ORAL | Status: DC
  Administered 2018-02-10 – 2018-02-11 (×4): 325 mg via ORAL
  Filled 2018-02-09 (×4): qty 1

## 2018-02-09 MED ORDER — GABAPENTIN 300 MG PO CAPS
600.0000 mg | ORAL_CAPSULE | Freq: Four times a day (QID) | ORAL | Status: DC
  Administered 2018-02-09 – 2018-02-11 (×5): 600 mg via ORAL
  Filled 2018-02-09 (×7): qty 2

## 2018-02-09 MED ORDER — DOCUSATE SODIUM 100 MG PO CAPS: 100.0000 mg | ORAL_CAPSULE | Freq: Two times a day (BID) | ORAL | 0 refills | Status: DC

## 2018-02-09 MED ORDER — ESOMEPRAZOLE MAGNESIUM 40 MG PO CPDR
40.0000 mg | DELAYED_RELEASE_CAPSULE | Freq: Every day | ORAL | Status: DC | PRN
  Administered 2018-02-10 – 2018-02-11 (×2): 40 mg via ORAL
  Filled 2018-02-09 (×4): qty 1

## 2018-02-09 MED ORDER — MAGNESIUM CITRATE PO SOLN: 1.0000 | Freq: Once | ORAL | Status: DC | PRN

## 2018-02-09 MED ORDER — FENTANYL CITRATE (PF) 100 MCG/2ML IJ SOLN
INTRAMUSCULAR | Status: DC | PRN
  Administered 2018-02-09: 25 ug via INTRAVENOUS

## 2018-02-09 MED ORDER — HYDROCODONE-ACETAMINOPHEN 7.5-325 MG PO TABS: 1.0000 | ORAL_TABLET | ORAL | 0 refills | Status: DC | PRN

## 2018-02-09 MED ORDER — PROPOFOL 500 MG/50ML IV EMUL
INTRAVENOUS | Status: DC | PRN
  Administered 2018-02-09: 125 ug/kg/min via INTRAVENOUS

## 2018-02-09 MED ORDER — DEXAMETHASONE SODIUM PHOSPHATE 10 MG/ML IJ SOLN
INTRAMUSCULAR | Status: AC
  Filled 2018-02-09: qty 1

## 2018-02-09 MED ORDER — PROMETHAZINE HCL 25 MG/ML IJ SOLN: 6.2500 mg | INTRAMUSCULAR | Status: DC | PRN

## 2018-02-09 MED ORDER — ONDANSETRON HCL 4 MG/2ML IJ SOLN
INTRAMUSCULAR | Status: AC
  Filled 2018-02-09: qty 2

## 2018-02-09 MED ORDER — BUPIVACAINE IN DEXTROSE 0.75-8.25 % IT SOLN
INTRATHECAL | Status: DC | PRN
  Administered 2018-02-09: 2 mL via INTRATHECAL

## 2018-02-09 MED ORDER — MIDAZOLAM HCL 2 MG/2ML IJ SOLN: 1.0000 mg | Freq: Once | INTRAMUSCULAR | Status: DC

## 2018-02-09 MED ORDER — MIDAZOLAM HCL 5 MG/5ML IJ SOLN
INTRAMUSCULAR | Status: DC | PRN
  Administered 2018-02-09: 2 mg via INTRAVENOUS

## 2018-02-09 MED ORDER — MENTHOL 3 MG MT LOZG: 1.0000 | LOZENGE | OROMUCOSAL | Status: DC | PRN

## 2018-02-09 MED ORDER — METHOCARBAMOL 500 MG PO TABS
500.0000 mg | ORAL_TABLET | Freq: Four times a day (QID) | ORAL | Status: DC | PRN
  Administered 2018-02-10 (×2): 500 mg via ORAL
  Filled 2018-02-09 (×2): qty 1

## 2018-02-09 MED ORDER — VALACYCLOVIR HCL 500 MG PO TABS
500.0000 mg | ORAL_TABLET | Freq: Every day | ORAL | Status: DC
  Administered 2018-02-10: 500 mg via ORAL
  Filled 2018-02-09 (×2): qty 1

## 2018-02-09 MED ORDER — POLYETHYLENE GLYCOL 3350 17 G PO PACK: 17.0000 g | PACK | Freq: Two times a day (BID) | ORAL | 0 refills | Status: DC

## 2018-02-09 MED ORDER — HYDROMORPHONE HCL 1 MG/ML IJ SOLN
0.5000 mg | INTRAMUSCULAR | Status: DC | PRN
  Administered 2018-02-09: 0.5 mg via INTRAVENOUS
  Filled 2018-02-09: qty 1

## 2018-02-09 MED ORDER — DARUN-COBIC-EMTRICIT-TENOFAF 800-150-200-10 MG PO TABS
1.0000 | ORAL_TABLET | Freq: Every day | ORAL | Status: DC
  Administered 2018-02-10 – 2018-02-11 (×2): 1 via ORAL
  Filled 2018-02-09 (×2): qty 1

## 2018-02-09 MED ORDER — BISACODYL 10 MG RE SUPP: 10.0000 mg | Freq: Every day | RECTAL | Status: DC | PRN

## 2018-02-09 MED ORDER — DOCUSATE SODIUM 100 MG PO CAPS
100.0000 mg | ORAL_CAPSULE | Freq: Two times a day (BID) | ORAL | Status: DC
  Administered 2018-02-09 – 2018-02-11 (×3): 100 mg via ORAL
  Filled 2018-02-09 (×3): qty 1

## 2018-02-09 MED ORDER — CEFAZOLIN SODIUM-DEXTROSE 2-4 GM/100ML-% IV SOLN
2.0000 g | INTRAVENOUS | Status: AC
  Administered 2018-02-09: 2 g via INTRAVENOUS
  Filled 2018-02-09: qty 100

## 2018-02-09 MED ORDER — METHOCARBAMOL 500 MG IVPB - SIMPLE MED
500.0000 mg | Freq: Four times a day (QID) | INTRAVENOUS | Status: DC | PRN
  Filled 2018-02-09: qty 50

## 2018-02-09 MED ORDER — TRANEXAMIC ACID-NACL 1000-0.7 MG/100ML-% IV SOLN
1000.0000 mg | INTRAVENOUS | Status: AC
  Administered 2018-02-09: 1000 mg via INTRAVENOUS
  Filled 2018-02-09: qty 100

## 2018-02-09 MED ORDER — TRANEXAMIC ACID-NACL 1000-0.7 MG/100ML-% IV SOLN
1000.0000 mg | Freq: Once | INTRAVENOUS | Status: AC
  Administered 2018-02-09: 1000 mg via INTRAVENOUS
  Filled 2018-02-09: qty 100

## 2018-02-09 MED ORDER — PROPOFOL 10 MG/ML IV BOLUS
INTRAVENOUS | Status: AC
  Filled 2018-02-09: qty 40

## 2018-02-09 MED ORDER — FERROUS SULFATE 325 (65 FE) MG PO TABS: 325.0000 mg | ORAL_TABLET | Freq: Three times a day (TID) | ORAL | 3 refills | Status: DC

## 2018-02-09 MED ORDER — SODIUM CHLORIDE 0.9 % IR SOLN
Status: DC | PRN
  Administered 2018-02-09: 1000 mL

## 2018-02-09 MED ORDER — RIVAROXABAN 10 MG PO TABS: 10.0000 mg | ORAL_TABLET | Freq: Every day | ORAL | 0 refills | Status: DC

## 2018-02-09 MED ORDER — MIDAZOLAM HCL 2 MG/2ML IJ SOLN
INTRAMUSCULAR | Status: AC
  Filled 2018-02-09: qty 2

## 2018-02-09 MED ORDER — PROPOFOL 10 MG/ML IV BOLUS
INTRAVENOUS | Status: AC
  Filled 2018-02-09: qty 20

## 2018-02-09 MED ORDER — HYDROCODONE-ACETAMINOPHEN 5-325 MG PO TABS: 1.0000 | ORAL_TABLET | ORAL | Status: DC | PRN

## 2018-02-09 MED ORDER — ACETAMINOPHEN 325 MG PO TABS: 325.0000 mg | ORAL_TABLET | Freq: Four times a day (QID) | ORAL | Status: DC | PRN

## 2018-02-09 MED ORDER — DEXAMETHASONE SODIUM PHOSPHATE 10 MG/ML IJ SOLN
10.0000 mg | Freq: Once | INTRAMUSCULAR | Status: AC
  Administered 2018-02-10: 10 mg via INTRAVENOUS
  Filled 2018-02-09: qty 1

## 2018-02-09 MED ORDER — EPHEDRINE SULFATE-NACL 50-0.9 MG/10ML-% IV SOSY
PREFILLED_SYRINGE | INTRAVENOUS | Status: DC | PRN
  Administered 2018-02-09 (×2): 5 mg via INTRAVENOUS

## 2018-02-09 SURGICAL SUPPLY — 40 items
BAG ZIPLOCK 12X15 (MISCELLANEOUS) ×3 IMPLANT
BLADE SAG 18X100X1.27 (BLADE) ×3 IMPLANT
COVER PERINEAL POST (MISCELLANEOUS) ×3 IMPLANT
COVER SURGICAL LIGHT HANDLE (MISCELLANEOUS) ×3 IMPLANT
COVER WAND RF STERILE (DRAPES) IMPLANT
CUP ACET PINNACLE SECTR 56MM (Hips) ×1 IMPLANT
DERMABOND ADVANCED (GAUZE/BANDAGES/DRESSINGS) ×2
DERMABOND ADVANCED .7 DNX12 (GAUZE/BANDAGES/DRESSINGS) ×1 IMPLANT
DRAPE STERI IOBAN 125X83 (DRAPES) ×3 IMPLANT
DRAPE U-SHAPE 47X51 STRL (DRAPES) ×6 IMPLANT
DRESSING AQUACEL AG SP 3.5X10 (GAUZE/BANDAGES/DRESSINGS) ×1 IMPLANT
DRSG AQUACEL AG SP 3.5X10 (GAUZE/BANDAGES/DRESSINGS) ×3
DURAPREP 26ML APPLICATOR (WOUND CARE) ×3 IMPLANT
ELECT REM PT RETURN 15FT ADLT (MISCELLANEOUS) ×3 IMPLANT
ELIMINATOR HOLE APEX DEPUY (Hips) ×3 IMPLANT
GLOVE BIO SURGEON STRL SZ7.5 (GLOVE) ×3 IMPLANT
GLOVE BIOGEL PI IND STRL 7.5 (GLOVE) ×5 IMPLANT
GLOVE BIOGEL PI IND STRL 8.5 (GLOVE) ×1 IMPLANT
GLOVE BIOGEL PI INDICATOR 7.5 (GLOVE) ×10
GLOVE BIOGEL PI INDICATOR 8.5 (GLOVE) ×2
GLOVE ECLIPSE 8.0 STRL XLNG CF (GLOVE) ×6 IMPLANT
GLOVE ORTHO TXT STRL SZ7.5 (GLOVE) ×3 IMPLANT
GLOVE SURG SS PI 7.5 STRL IVOR (GLOVE) ×3 IMPLANT
GOWN STRL REUS W/TWL 2XL LVL3 (GOWN DISPOSABLE) ×6 IMPLANT
GOWN STRL REUS W/TWL LRG LVL3 (GOWN DISPOSABLE) ×6 IMPLANT
HEAD CERAMIC 36 PLUS5 (Hips) ×3 IMPLANT
HOLDER FOLEY CATH W/STRAP (MISCELLANEOUS) ×3 IMPLANT
PACK ANTERIOR HIP CUSTOM (KITS) ×3 IMPLANT
PINNACLE ALTRX PLUS 4 N 36X56 (Hips) ×3 IMPLANT
PINNACLE SECTOR CUP 56MM (Hips) ×3 IMPLANT
SCREW 6.5MMX25MM (Screw) ×3 IMPLANT
STEM FEMORAL SZ9 HIGH ACTIS (Stem) ×3 IMPLANT
SUT MNCRL AB 4-0 PS2 18 (SUTURE) ×3 IMPLANT
SUT STRATAFIX 0 PDS 27 VIOLET (SUTURE) ×3
SUT VIC AB 1 CT1 36 (SUTURE) ×9 IMPLANT
SUT VIC AB 2-0 CT1 27 (SUTURE) ×4
SUT VIC AB 2-0 CT1 TAPERPNT 27 (SUTURE) ×2 IMPLANT
SUTURE STRATFX 0 PDS 27 VIOLET (SUTURE) ×1 IMPLANT
TRAY FOLEY MTR SLVR 16FR STAT (SET/KITS/TRAYS/PACK) ×3 IMPLANT
YANKAUER SUCT BULB TIP 10FT TU (MISCELLANEOUS) ×3 IMPLANT

## 2018-02-09 NOTE — Transfer of Care (Signed)
Immediate Anesthesia Transfer of Care Note  Patient: [name]  Procedure(s) Performed: TOTAL HIP ARTHROPLASTY ANTERIOR APPROACH (Right Hip)  Patient Location: PACU  Anesthesia Type:Spinal  Level of Consciousness: awake, alert  and oriented  Airway & Oxygen Therapy: Patient Spontanous Breathing and Patient connected to face mask oxygen  Post-op Assessment: Report given to RN and Post -op Vital signs reviewed and stable  Post vital signs: Reviewed and stable  Last Vitals:  Vitals Value Taken Time  BP 124/81 02/09/2018  6:15 PM  Temp    Pulse 71 02/09/2018  6:17 PM  Resp 16 02/09/2018  6:17 PM  SpO2 100 % 02/09/2018  6:17 PM  Vitals shown include unvalidated device data.  Last Pain:  Vitals:   02/09/18 1500  TempSrc:   PainSc: 3       Patients Stated Pain Goal: 5 (02/09/18 1500)  Complications: No apparent anesthesia complications

## 2018-02-09 NOTE — Care Plan (Signed)
Ortho Bundle Case Management Note  Patient Details  Name: [name] MRN: [id number] Date of Birth: 04/13/1965  R THA scheduled 02-09-18 DCP:  Home with mother.  1 story home with 5 ste. DME:  No needs.  Prefers crutches (recd from office) and doesn't want a 3-in-1. PT:  HEP                   DME Arranged:  N/A DME Agency:  NA  HH Arranged:  NA HH Agency:  NA  Additional Comments: Please contact me with any questions of if this plan should need to change.  Ennis FortsJill A Lauer, RN,CCM [facility]  [phone] 02/09/2018, 1:46 PM

## 2018-02-09 NOTE — Anesthesia Preprocedure Evaluation (Signed)
Anesthesia Evaluation  Patient identified by MRN, date of birth, ID band Patient awake    Reviewed: Allergy & Precautions, NPO status , Patient's Chart, lab work & pertinent test results  Airway Mallampati: II  TM Distance: >3 FB Neck ROM: Full    Dental no notable dental hx.    Pulmonary neg pulmonary ROS, former smoker,    Pulmonary exam normal breath sounds clear to auscultation       Cardiovascular negative cardio ROS Normal cardiovascular exam Rhythm:Regular Rate:Normal     Neuro/Psych Anxiety negative neurological ROS     GI/Hepatic negative GI ROS, Neg liver ROS,   Endo/Other  negative endocrine ROS  Renal/GU negative Renal ROS  negative genitourinary   Musculoskeletal negative musculoskeletal ROS (+)   Abdominal   Peds negative pediatric ROS (+)  Hematology  (+) HIV,   Anesthesia Other Findings   Reproductive/Obstetrics negative OB ROS                             Anesthesia Physical Anesthesia Plan  ASA: III  Anesthesia Plan: Spinal   Post-op Pain Management:    Induction: Intravenous  PONV Risk Score and Plan: 1 and Ondansetron  Airway Management Planned: Simple Face Mask  Additional Equipment:   Intra-op Plan:   Post-operative Plan:   Informed Consent: I have reviewed the patients History and Physical, chart, labs and discussed the procedure including the risks, benefits and alternatives for the proposed anesthesia with the patient or authorized representative who has indicated his/her understanding and acceptance.   Dental advisory given  Plan Discussed with: CRNA and Surgeon  Anesthesia Plan Comments:         Anesthesia Quick Evaluation

## 2018-02-09 NOTE — Anesthesia Procedure Notes (Signed)
Spinal  Start time: 02/09/2018 4:25 PM End time: 02/09/2018 4:29 PM Staffing Resident/CRNA: Nelle DonPulliam, Leza Apsey C, CRNA Performed: resident/CRNA  Preanesthetic Checklist Completed: patient identified, surgical consent, pre-op evaluation, IV checked, risks and benefits discussed and monitors and equipment checked Spinal Block Patient position: sitting Prep: DuraPrep Patient monitoring: heart rate, continuous pulse ox and blood pressure Approach: midline Location: L2-3 Injection technique: single-shot Needle Needle type: Pencan  Needle gauge: 24 G Needle length: 5 cm Assessment Sensory level: T6

## 2018-02-09 NOTE — Interval H&P Note (Signed)
History and Physical Interval Note:  02/09/2018 2:04 PM  Joseph ReekLarry D Sylvan  has presented today for surgery, with the diagnosis of Right hip osteoarthritis  The various methods of treatment have been discussed with the patient and family. After consideration of risks, benefits and other options for treatment, the patient has consented to  Procedure(s) with comments: TOTAL HIP ARTHROPLASTY ANTERIOR APPROACH (Right) - 70 mins as a surgical intervention .  The patient's history has been reviewed, patient examined, no change in status, stable for surgery.  I have reviewed the patient's chart and labs.  Questions were answered to the patient's satisfaction.     Shelda PalMatthew D Dynasti Kerman

## 2018-02-09 NOTE — Anesthesia Procedure Notes (Signed)
Date/Time: 02/09/2018 4:23 PM Performed by: Nelle DonPulliam, Dayvian Blixt C, CRNA Pre-anesthesia Checklist: Patient being monitored, Suction available, Emergency Drugs available and Patient identified Oxygen Delivery Method: Simple face mask

## 2018-02-09 NOTE — Op Note (Signed)
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      PROCEDURE:  Right total hip replacement through an anterior approach   utilizing DePuy THR system, component size 56mm pinnacle cup, a size 36+4 neutral   Altrex liner, a size 9 Hi Actis femoral stem with a 36+5 delta ceramic   ball.      SURGEON:  Madlyn Frankel. Charlann Boxer, M.D.      ASSISTANT:  Lanney Gins, PA-C     ANESTHESIA:  Spinal.      SPECIMENS:  None.      COMPLICATIONS:  None.      BLOOD LOSS:  350 cc     DRAINS:  None.      INDICATION OF THE PROCEDURE:  [name] is a 53 y.o. male who had   presented to office for evaluation of right hip pain.  Radiographs revealed advanced avascular necrosis with femoral head collapse and advanced degenerative joint disease changes with bone-on-bone   articulation of the  hip joint, including subchondral cystic changes and osteophytes.  The patient had painful limited range of   motion significantly affecting their overall quality of life and function.  The patient was failing to    respond to conservative measures including medications and/or injections and activity modification and at this point was ready   to proceed with more definitive measures.  Consent was obtained for   benefit of pain relief.  Specific risks of infection, DVT, component   failure, dislocation, neurovascular injury, and need for revision surgery were reviewed in the office as well discussion of   the anterior versus posterior approach were  reviewed.     PROCEDURE IN DETAIL:  The patient was brought to operative theater.   Once adequate anesthesia, preoperative antibiotics, 1 gm of Vancomycin, 2 gm of Ancef, 1 gm of Tranexamic Acid, and 10 mg of Decadron were administered, the patient was positioned supine on the [employer] table.  Once the patient was safely positioned with adequate padding of boney prominences we predraped out the hip, and used fluoroscopy to confirm orientation of the pelvis.      The right hip was then prepped and draped from proximal iliac crest to   mid thigh with a shower curtain technique.      Time-out was performed identifying the patient, planned procedure, and the appropriate extremity.     An incision was then made 2 cm lateral to the   anterior superior iliac spine extending over the orientation of the   tensor fascia lata muscle and sharp dissection was carried down to the   fascia of the muscle.      The fascia was then incised.  The muscle belly  was identified and swept   laterally and retractor placed along the superior neck.  Following   cauterization of the circumflex vessels and removing some pericapsular   fat, a second cobra retractor was placed on the inferior neck.  A T-capsulotomy was made along the line of the   superior neck to the trochanteric fossa, then extended proximally and   distally.  Tag sutures were placed and the retractors were then placed   intracapsular.  We then identified the trochanteric fossa and   orientation of my neck cut and then made a neck osteotomy with the femur on traction.  The femoral   head was removed without difficulty or complication.  Traction was let   off and retractors were placed posterior and anterior around the   acetabulum.      The labrum and foveal tissue were debrided.  I began reaming with a 46 mm   reamer and reamed up to 55 mm reamer with good bony bed preparation and a 56 mm  cup was chosen.  Significant peri-acetabular osteophytes  were removed circumferentially from around the native acetabulum.  The final 56 mm Pinnacle cup was then impacted under fluoroscopy to confirm the depth of penetration and orientation with respect to   Abduction and forward flexion.  A screw was placed into the ilium followed by the hole eliminator.  The final   36+4 neutral Altrex liner was impacted with good visualized rim fit.  The cup was positioned anatomically within the acetabular portion of the pelvis.      At this point, the femur was rolled to 100 degrees.  Further capsule was   released off the inferior aspect of the femoral neck.  I then   released the superior capsule proximally.  With the leg in a neutral position the hook was placed laterally   along the femur under the vastus lateralis origin and elevated manually and then held in position using the hook attachment on the bed.  The leg was then extended and adducted with the leg rolled to 100   degrees of external rotation.  Retractors were placed along the medial calcar and posteriorly over the greater trochanter.  Once the proximal femur was fully   exposed, I used a box osteotome to set orientation.  I then began   broaching with the starting chili pepper broach and passed this by hand and then broached up to 9.  With the 9 broach in place I chose a high offset neck and did several trial reductions.  The offset was appropriate, leg lengths   appeared to be equal best matched with the +5 head ball trial confirmed radiographically.   Given these findings, I went ahead and dislocated the hip, repositioned all   retractors and positioned the right hip in the extended and abducted position.  The final 9 Hi Actis femoral stem was   chosen and it was impacted down to the level of neck cut.  Based on this   and the trial reductions, a final 36+5 delta ceramic ball was chosen and   impacted onto a clean and dry trunnion, and the hip was reduced.  The   hip had been irrigated throughout  the case again at this point.  I did   reapproximate the superior capsular leaflet to the anterior leaflet   using #1 Vicryl.  The fascia of the   tensor fascia lata muscle was then reapproximated using #1 Vicryl and #0 Stratafix sutures.  The  remaining wound was closed with 2-0 Vicryl and running 4-0 Monocryl.   The hip was cleaned, dried, and dressed sterilely using Dermabond and   Aquacel dressing.  The patient was then brought   to recovery room in stable condition tolerating the procedure well.    Lanney Gins, PA-C was present for the entirety of the case involved from   preoperative positioning, perioperative retractor management, general   facilitation of the case, as well as primary wound closure as assistant.            Madlyn Frankel Charlann Boxer, M.D.        02/09/2018 2:12 PM

## 2018-02-09 NOTE — Discharge Instructions (Addendum)
INSTRUCTIONS AFTER JOINT REPLACEMENT  ° °o Remove items at home which could result in a fall. This includes throw rugs or furniture in walking pathways °o ICE to the affected joint every three hours while awake for 30 minutes at a time, for at least the first 3-5 days, and then as needed for pain and swelling.  Continue to use ice for pain and swelling. You may notice swelling that will progress down to the foot and ankle.  This is normal after surgery.  Elevate your leg when you are not up walking on it.   °o Continue to use the breathing machine you got in the hospital (incentive spirometer) which will help keep your temperature down.  It is common for your temperature to cycle up and down following surgery, especially at night when you are not up moving around and exerting yourself.  The breathing machine keeps your lungs expanded and your temperature down. ° ° °DIET:  As you were doing prior to hospitalization, we recommend a well-balanced diet. ° °DRESSING / WOUND CARE / SHOWERING ° °Keep the surgical dressing until follow up.  The dressing is water proof, so you can shower without any extra covering.  IF THE DRESSING FALLS OFF or the wound gets wet inside, change the dressing with sterile gauze.  Please use good hand washing techniques before changing the dressing.  Do not use any lotions or creams on the incision until instructed by your surgeon.   ° °ACTIVITY ° °o Increase activity slowly as tolerated, but follow the weight bearing instructions below.   °o No driving for 6 weeks or until further direction given by your physician.  You cannot drive while taking narcotics.  °o No lifting or carrying greater than 10 lbs. until further directed by your surgeon. °o Avoid periods of inactivity such as sitting longer than an hour when not asleep. This helps prevent blood clots.  °o You may return to work once you are authorized by your doctor.  ° ° ° °WEIGHT BEARING  ° °Weight bearing as tolerated with assist  device (walker, cane, etc) as directed, use it as long as suggested by your surgeon or therapist, typically at least 4-6 weeks. ° ° °EXERCISES ° °Results after joint replacement surgery are often greatly improved when you follow the exercise, range of motion and muscle strengthening exercises prescribed by your doctor. Safety measures are also important to protect the joint from further injury. Any time any of these exercises cause you to have increased pain or swelling, decrease what you are doing until you are comfortable again and then slowly increase them. If you have problems or questions, call your caregiver or physical therapist for advice.  ° °Rehabilitation is important following a joint replacement. After just a few days of immobilization, the muscles of the leg can become weakened and shrink (atrophy).  These exercises are designed to build up the tone and strength of the thigh and leg muscles and to improve motion. Often times heat used for twenty to thirty minutes before working out will loosen up your tissues and help with improving the range of motion but do not use heat for the first two weeks following surgery (sometimes heat can increase post-operative swelling).  ° °These exercises can be done on a training (exercise) mat, on the floor, on a table or on a bed. Use whatever works the best and is most comfortable for you.    Use music or television while you are exercising so that   the exercises are a pleasant break in your day. This will make your life better with the exercises acting as a break in your routine that you can look forward to.   Perform all exercises about fifteen times, three times per day or as directed.  You should exercise both the operative leg and the other leg as well. ° °Exercises include: °  °• Quad Sets - Tighten up the muscle on the front of the thigh (Quad) and hold for 5-10 seconds.   °• Straight Leg Raises - With your knee straight (if you were given a brace, keep it on),  lift the leg to 60 degrees, hold for 3 seconds, and slowly lower the leg.  Perform this exercise against resistance later as your leg gets stronger.  °• Leg Slides: Lying on your back, slowly slide your foot toward your buttocks, bending your knee up off the floor (only go as far as is comfortable). Then slowly slide your foot back down until your leg is flat on the floor again.  °• Angel Wings: Lying on your back spread your legs to the side as far apart as you can without causing discomfort.  °• Hamstring Strength:  Lying on your back, push your heel against the floor with your leg straight by tightening up the muscles of your buttocks.  Repeat, but this time bend your knee to a comfortable angle, and push your heel against the floor.  You may put a pillow under the heel to make it more comfortable if necessary.  ° °A rehabilitation program following joint replacement surgery can speed recovery and prevent re-injury in the future due to weakened muscles. Contact your doctor or a physical therapist for more information on knee rehabilitation.  ° ° °CONSTIPATION ° °Constipation is defined medically as fewer than three stools per week and severe constipation as less than one stool per week.  Even if you have a regular bowel pattern at home, your normal regimen is likely to be disrupted due to multiple reasons following surgery.  Combination of anesthesia, postoperative narcotics, change in appetite and fluid intake all can affect your bowels.  ° °YOU MUST use at least one of the following options; they are listed in order of increasing strength to get the job done.  They are all available over the counter, and you may need to use some, POSSIBLY even all of these options:   ° °Drink plenty of fluids (prune juice may be helpful) and high fiber foods °Colace 100 mg by mouth twice a day  °Senokot for constipation as directed and as needed Dulcolax (bisacodyl), take with full glass of water  °Miralax (polyethylene glycol)  once or twice a day as needed. ° °If you have tried all these things and are unable to have a bowel movement in the first 3-4 days after surgery call either your surgeon or your primary doctor.   ° °If you experience loose stools or diarrhea, hold the medications until you stool forms back up.  If your symptoms do not get better within 1 week or if they get worse, check with your doctor.  If you experience "the worst abdominal pain ever" or develop nausea or vomiting, please contact the office immediately for further recommendations for treatment. ° ° °ITCHING:  If you experience itching with your medications, try taking only a single pain pill, or even half a pain pill at a time.  You can also use Benadryl over the counter for itching or also to   help with sleep.   TED HOSE STOCKINGS:  Use stockings on both legs until for at least 2 weeks or as directed by physician office. They may be removed at night for sleeping.  MEDICATIONS:  See your medication summary on the After Visit Summary that nursing will review with you.  You may have some home medications which will be placed on hold until you complete the course of blood thinner medication.  It is important for you to complete the blood thinner medication as prescribed.  PRECAUTIONS:  If you experience chest pain or shortness of breath - call 911 immediately for transfer to the hospital emergency department.   If you develop a fever greater that 101 F, purulent drainage from wound, increased redness or drainage from wound, foul odor from the wound/dressing, or calf pain - CONTACT YOUR SURGEON.                                                   FOLLOW-UP APPOINTMENTS:  If you do not already have a post-op appointment, please call the office for an appointment to be seen by your surgeon.  Guidelines for how soon to be seen are listed in your After Visit Summary, but are typically between 1-4 weeks after surgery.  OTHER INSTRUCTIONS:   Knee  Replacement:  Do not place pillow under knee, focus on keeping the knee straight while resting.   MAKE SURE YOU:   Understand these instructions.   Get help right away if you are not doing well or get worse.    Thank you for letting us be a part of your medical care team.  It is a privilege we respect greatly.  We hope these instructions will help you stay on track for a fast and full recovery!     Information on my medicine - Coumadin   (Warfarin)  This medication education was reviewed with me or my healthcare representative as part of my discharge preparation.  The pharmacist that spoke with me during my hospital stay was:  Colleene Swarthout A, RPH  Why was Coumadin prescribed for you? Coumadin was prescribed for you because you have a blood clot or a medical condition that can cause an increased risk of forming blood clots. Blood clots can cause serious health problems by blocking the flow of blood to the heart, lung, or brain. Coumadin can prevent harmful blood clots from forming. As a reminder your indication for Coumadin is:   Blood Clot Prevention After Orthopedic Surgery  What test will check on my response to Coumadin? While on Coumadin (warfarin) you will need to have an INR test regularly to ensure that your dose is keeping you in the desired range. The INR (international normalized ratio) number is calculated from the result of the laboratory test called prothrombin time (PT).  If an INR APPOINTMENT HAS NOT ALREADY BEEN MADE FOR YOU please schedule an appointment to have this lab work done by your health care provider within 7 days. Your INR goal is usually a number between:  2 to 3 or your provider may give you a more narrow range like 2-2.5.  Ask your health care provider during an office visit what your goal INR is.  What  do you need to  know  About  COUMADIN? Take Coumadin (warfarin) exactly as prescribed by your healthcare provider  about the same time each day.  DO NOT stop  taking without talking to the doctor who prescribed the medication.  Stopping without other blood clot prevention medication to take the place of Coumadin may increase your risk of developing a new clot or stroke.  Get refills before you run out.  What do you do if you miss a dose? If you miss a dose, take it as soon as you remember on the same day then continue your regularly scheduled regimen the next day.  Do not take two doses of Coumadin at the same time.  Important Safety Information A possible side effect of Coumadin (Warfarin) is an increased risk of bleeding. You should call your healthcare provider right away if you experience any of the following: ? Bleeding from an injury or your nose that does not stop. ? Unusual colored urine (red or dark brown) or unusual colored stools (red or black). ? Unusual bruising for unknown reasons. ? A serious fall or if you hit your head (even if there is no bleeding).  Some foods or medicines interact with Coumadin (warfarin) and might alter your response to warfarin. To help avoid this: ? Eat a balanced diet, maintaining a consistent amount of Vitamin K. ? Notify your provider about major diet changes you plan to make. ? Avoid alcohol or limit your intake to 1 drink for women and 2 drinks for men per day. (1 drink is 5 oz. wine, 12 oz. beer, or 1.5 oz. liquor.)  Make sure that ANY health care provider who prescribes medication for you knows that you are taking Coumadin (warfarin).  Also make sure the healthcare provider who is monitoring your Coumadin knows when you have started a new medication including herbals and non-prescription products.  Coumadin (Warfarin)  Major Drug Interactions  Increased Warfarin Effect Decreased Warfarin Effect  Alcohol (large quantities) Antibiotics (esp. Septra/Bactrim, Flagyl, Cipro) Amiodarone (Cordarone) Aspirin (ASA) Cimetidine (Tagamet) Megestrol (Megace) NSAIDs (ibuprofen, naproxen, etc.) Piroxicam  (Feldene) Propafenone (Rythmol SR) Propranolol (Inderal) Isoniazid (INH) Posaconazole (Noxafil) Barbiturates (Phenobarbital) Carbamazepine (Tegretol) Chlordiazepoxide (Librium) Cholestyramine (Questran) Griseofulvin Oral Contraceptives Rifampin Sucralfate (Carafate) Vitamin K   Coumadin (Warfarin) Major Herbal Interactions  Increased Warfarin Effect Decreased Warfarin Effect  Garlic Ginseng Ginkgo biloba Coenzyme Q10 Green tea St. Johns wort    Coumadin (Warfarin) FOOD Interactions  Eat a consistent number of servings per week of foods HIGH in Vitamin K (1 serving =  cup)  Collards (cooked, or boiled & drained) Kale (cooked, or boiled & drained) Mustard greens (cooked, or boiled & drained) Parsley *serving size only =  cup Spinach (cooked, or boiled & drained) Swiss chard (cooked, or boiled & drained) Turnip greens (cooked, or boiled & drained)  Eat a consistent number of servings per week of foods MEDIUM-HIGH in Vitamin K (1 serving = 1 cup)  Asparagus (cooked, or boiled & drained) Broccoli (cooked, boiled & drained, or raw & chopped) Brussel sprouts (cooked, or boiled & drained) *serving size only =  cup Lettuce, raw (green leaf, endive, romaine) Spinach, raw Turnip greens, raw & chopped   These websites have more information on Coumadin (warfarin):  http://www.king-russell.com/; https://www.hines.net/;

## 2018-02-09 NOTE — Progress Notes (Signed)
ANTICOAGULATION CONSULT NOTE   Pharmacy Consult for warfarin Indication: VTE prophylaxis  Allergies  Allergen Reactions  . Hydrocodone Other (See Comments)    "makes me sleepy"  . Sulfa Antibiotics Rash  . Tramadol Nausea Only    Patient Measurements: Height: 6\' 1"  (185.4 cm) Weight: 218 lb 4.1 oz (99 kg) IBW/kg (Calculated) : 79.9 Heparin Dosing Weight:   Vital Signs: Temp: 97.6 F (36.4 C) (11/26 2035) Temp Source: Oral (11/26 2035) BP: 121/77 (11/26 2035) Pulse Rate: 74 (11/26 2035)  Labs: No results for input(s): HGB, HCT, PLT, APTT, LABPROT, INR, HEPARINUNFRC, HEPRLOWMOCWT, CREATININE, CKTOTAL, CKMB, TROPONINI in the last 72 hours.  Estimated Creatinine Clearance: 79.3 mL/min (A) (by C-G formula based on SCr of 1.38 mg/dL (H)).   Assessment: Patient's a 52 y.o. M with hx PE (in 2015) and HIV s/p right THR on 11/26 with xarelto ordered post-op for VTE prophylaxis. Patient is on Symtuza for HIV and the Cobicisctat component of symtiza has significant drug-drug intxns with xarelto.  D/W Marriottmber Constable (PA) - to change anticoag. to warfarin as drug level can be monitored with INR.  Amber said to start warfarin on 11/27.   Goal of Therapy:  INR 2-3 Monitor platelets by anticoagulation protocol: Yes   Plan:  - check baseline INR on 11/27 and will dose warfarin on 11/27  Drayson Dorko P 02/09/2018,9:21 PM

## 2018-02-10 ENCOUNTER — Other Ambulatory Visit: Payer: Self-pay

## 2018-02-10 ENCOUNTER — Encounter (HOSPITAL_COMMUNITY): Payer: Self-pay | Admitting: Orthopedic Surgery

## 2018-02-10 LAB — PROTIME-INR
INR: 0.99
PROTHROMBIN TIME: 12.9 s (ref 11.4–15.2)

## 2018-02-10 LAB — BASIC METABOLIC PANEL
ANION GAP: 8 (ref 5–15)
BUN: 19 mg/dL (ref 6–20)
CO2: 20 mmol/L — ABNORMAL LOW (ref 22–32)
Calcium: 8.7 mg/dL — ABNORMAL LOW (ref 8.9–10.3)
Chloride: 110 mmol/L (ref 98–111)
Creatinine, Ser: 1.04 mg/dL (ref 0.61–1.24)
GFR calc Af Amer: >60 mL/min (ref >60)
GFR calc non Af Amer: >60 mL/min (ref >60)
GLUCOSE: 141 mg/dL — AB (ref 70–99)
POTASSIUM: 3.7 mmol/L (ref 3.5–5.1)
SODIUM: 138 mmol/L (ref 135–145)

## 2018-02-10 LAB — CBC
HCT: 35.2 % — ABNORMAL LOW (ref 39.0–52.0)
Hemoglobin: 11.7 g/dL — ABNORMAL LOW (ref 13.0–17.0)
MCH: 29.6 pg (ref 26.0–34.0)
MCHC: 33.2 g/dL (ref 30.0–36.0)
MCV: 89.1 fL (ref 80.0–100.0)
NRBC: 0 % (ref 0.0–0.2)
Platelets: 232 10*3/uL (ref 150–400)
RBC: 3.95 MIL/uL — AB (ref 4.22–5.81)
RDW: 12.7 % (ref 11.5–15.5)
WBC: 13.2 10*3/uL — ABNORMAL HIGH (ref 4.0–10.5)

## 2018-02-10 MED ORDER — HYDROMORPHONE HCL 2 MG PO TABS: 2.0000 mg | ORAL_TABLET | ORAL | Status: DC | PRN

## 2018-02-10 MED ORDER — OXYCODONE HCL 5 MG PO TABS
10.0000 mg | ORAL_TABLET | ORAL | Status: DC | PRN
  Administered 2018-02-10 (×2): 15 mg via ORAL
  Administered 2018-02-11: 10 mg via ORAL
  Administered 2018-02-11: 15 mg via ORAL
  Filled 2018-02-10 (×2): qty 3
  Filled 2018-02-10: qty 2
  Filled 2018-02-10: qty 3
  Filled 2018-02-10: qty 2
  Filled 2018-02-10: qty 3

## 2018-02-10 MED ORDER — ENOXAPARIN SODIUM 40 MG/0.4ML ~~LOC~~ SOLN
40.0000 mg | SUBCUTANEOUS | Status: DC
  Administered 2018-02-10 – 2018-02-11 (×2): 40 mg via SUBCUTANEOUS
  Filled 2018-02-10 (×2): qty 0.4

## 2018-02-10 MED ORDER — ENOXAPARIN SODIUM 40 MG/0.4ML ~~LOC~~ SOLN: 40.0000 mg | SUBCUTANEOUS | 0 refills | Status: DC

## 2018-02-10 MED ORDER — WARFARIN VIDEO
Freq: Once | Status: AC
  Administered 2018-02-11: 1

## 2018-02-10 MED ORDER — WARFARIN SODIUM 5 MG PO TABS
5.0000 mg | ORAL_TABLET | Freq: Once | ORAL | Status: AC
  Administered 2018-02-10: 5 mg via ORAL
  Filled 2018-02-10: qty 1

## 2018-02-10 MED ORDER — ACETAMINOPHEN 500 MG PO TABS: 1000.0000 mg | ORAL_TABLET | Freq: Three times a day (TID) | ORAL | 0 refills | Status: DC

## 2018-02-10 MED ORDER — ACETAMINOPHEN 500 MG PO TABS
1000.0000 mg | ORAL_TABLET | Freq: Four times a day (QID) | ORAL | Status: DC
  Administered 2018-02-10 (×2): 1000 mg via ORAL
  Filled 2018-02-10 (×2): qty 2

## 2018-02-10 MED ORDER — DOXYCYCLINE HYCLATE 100 MG PO CAPS: 100.0000 mg | ORAL_CAPSULE | Freq: Two times a day (BID) | ORAL | 0 refills | Status: DC

## 2018-02-10 MED ORDER — COUMADIN BOOK
Freq: Once | Status: AC
  Administered 2018-02-11: 1
  Filled 2018-02-10: qty 1

## 2018-02-10 MED ORDER — OXYCODONE HCL 10 MG PO TABS: 10.0000 mg | ORAL_TABLET | ORAL | 0 refills | Status: DC | PRN

## 2018-02-10 MED ORDER — HYDROMORPHONE HCL 2 MG PO TABS
2.0000 mg | ORAL_TABLET | ORAL | Status: DC | PRN
  Administered 2018-02-10: 2 mg via ORAL
  Filled 2018-02-10: qty 1

## 2018-02-10 MED ORDER — WARFARIN - PHARMACIST DOSING INPATIENT: Freq: Every day | Status: DC

## 2018-02-10 MED ORDER — METHOCARBAMOL 500 MG PO TABS: 500.0000 mg | ORAL_TABLET | Freq: Four times a day (QID) | ORAL | 0 refills | Status: DC | PRN

## 2018-02-10 MED ORDER — MORPHINE SULFATE (PF) 2 MG/ML IV SOLN
2.0000 mg | INTRAVENOUS | Status: DC | PRN
  Administered 2018-02-10: 2 mg via INTRAVENOUS
  Filled 2018-02-10: qty 1

## 2018-02-10 NOTE — Progress Notes (Signed)
ANTICOAGULATION CONSULT NOTE   Pharmacy Consult for warfarin Indication: VTE prophylaxis  Allergies  Allergen Reactions  . Hydrocodone Other (See Comments)    "makes me overly sleepy"  . Sulfa Antibiotics Rash  . Tramadol Nausea Only    Patient Measurements: Height: 6\' 1"  (185.4 cm) Weight: 218 lb 4.1 oz (99 kg) IBW/kg (Calculated) : 79.9   Vital Signs: Temp: 98 F (36.7 C) (11/27 1054) Temp Source: Oral (11/27 0435) BP: 124/75 (11/27 1054) Pulse Rate: 77 (11/27 1054)  Labs: Recent Labs    02/10/18 0527  HGB 11.7*  HCT 35.2*  PLT 232  LABPROT 12.9  INR 0.99  CREATININE 1.04    Estimated Creatinine Clearance: 105.2 mL/min (by C-G formula based on SCr of 1.04 mg/dL).   Assessment: Patient's a 52 y.o. M with hx PE (in 2015) and HIV s/p right THR on 11/26 with xarelto ordered post-op for VTE prophylaxis. Patient is on Symtuza for HIV and the Cobicisctat component  has significant drug-drug intxns with xarelto. D/W Marriottmber Constable (PA) - to change anticoag. to warfarin as drug level can be monitored with INR.  Amber said to start warfarin on 11/27.   Baseline INR normal as expected  Prior anticoagulation: none  Significant events:  Today, 02/10/2018:  CBC: H&H slightly low as expected postop; Plt wnl  INR subtherapeutic  Major drug interactions: cobicisctat  No bleeding issues per nursing  Goal of Therapy: INR 2-3  Plan:  Warfarin 5 mg PO tonight at 18:00 - given concomitant inhibitor, would continue with 2.5 mg daily until INR trend more apparent  Daily INR  CBC at least q72 hr while on warfarin  Monitor for signs of bleeding or thrombosis  Pharmacy to provide education prior to discharge   Bernadene Personrew Raylee Adamec, PharmD, BCPS [phone] 02/10/2018, 12:59 PM

## 2018-02-10 NOTE — Progress Notes (Signed)
     Subjective: 1 Day Post-Op Procedure(s) (LRB): TOTAL HIP ARTHROPLASTY ANTERIOR APPROACH (Right)   Seen by Dr. Charlann Boxerlin. Patient reports pain as severe, states that this is the worse pain he has had with any of his hip surgeries.  Meds were changed a couple of times throughout the night. This morning some issues with pain, but change of meds has improved his pain significantly.  Now stating that if he continues to do well he would like to go home.    Objective:   VITALS:   Vitals:   02/10/18 0227 02/10/18 0435  BP: 124/75 117/73  Pulse: 83 73  Resp: 20 20  Temp: 98.3 F (36.8 C) 98.2 F (36.8 C)  SpO2: 97% 97%    Dorsiflexion/Plantar flexion intact Incision: dressing C/D/I No cellulitis present Compartment soft  LABS Recent Labs    02/10/18 0527  HGB 11.7*  HCT 35.2*  WBC 13.2*  PLT 232    Recent Labs    02/10/18 0527  NA 138  K 3.7  BUN 19  CREATININE 1.04  GLUCOSE 141*     Assessment/Plan: 1 Day Post-Op Procedure(s) (LRB): TOTAL HIP ARTHROPLASTY ANTERIOR APPROACH (Right) Foley cath d/c'ed Advance diet Up with therapy D/C IV fluids Discharge home if he does well with PT and pain stays controlled  Overweight (BMI 25-29.9) Estimated body mass index is 28.8 kg/m as calculated from the following:   Height as of this encounter: 6\' 1"  (1.854 m).   Weight as of this encounter: 99 kg. Patient also counseled that weight may inhibit the healing process Patient counseled that losing weight will help with future health issues      Anastasio AuerbachMatthew S. Nailea Whitehorn   PAC  02/10/2018, 9:50 AM

## 2018-02-10 NOTE — Progress Notes (Signed)
Patient with c/o of pain at right hip surgical site, request RN notify surgeon of pain and request physician presence at bedside. RN notified A. Celedonio Savageonstable, PA of pain and reviewed PRN medications, new orders received, RN to continue to monitor patient status, [place] Winnie Barsky RN [name] 02/10/2018 [phone]

## 2018-02-10 NOTE — Evaluation (Signed)
Physical Therapy Evaluation Patient Details Name: [name] MRN: [id number] DOB: 08/05/1965 Today's Date: 02/10/2018   History of Present Illness  Pt s/p R THR and with hx of HIV and multiple L hip surgeries  Clinical Impression  Pt s/p R THR and presents with decreased R LE strength/ROM and post op pain limiting functional mobility.  Pt should progress to dc home with family assist    Follow Up Recommendations Follow surgeon's recommendation for DC plan and follow-up therapies    Equipment Recommendations  Rolling walker with 5" wheels;3in1 (PT)    Recommendations for Other Services OT consult     Precautions / Restrictions Precautions Precautions: Fall Restrictions Weight Bearing Restrictions: No Other Position/Activity Restrictions: WBAT      Mobility  Bed Mobility Overal bed mobility: Needs Assistance Bed Mobility: Supine to Sit;Sit to Supine     Supine to sit: Min assist Sit to supine: Min assist   General bed mobility comments: Increased time with cues for sequence and use of L LE to self assist.  Transfers Overall transfer level: Needs assistance Equipment used: Rolling walker (2 wheeled) Transfers: Sit to/from Stand Sit to Stand: Min assist;Min guard         General transfer comment: cues for LE management and use of UEs to self assist  Ambulation/Gait Ambulation/Gait assistance: Min assist Gait Distance (Feet): 100 Feet Assistive device: Rolling walker (2 wheeled) Gait Pattern/deviations: Step-to pattern;Decreased step length - right;Decreased step length - left;Shuffle;Trunk flexed Gait velocity: decr   General Gait Details: cues for sequence, posture and position from [employer]  Stairs            Wheelchair Mobility    Modified Rankin (Stroke Patients Only)       Balance Overall balance assessment: Mild deficits observed, not formally tested                                           Pertinent Vitals/Pain Pain  Assessment: 0-10 Pain Score: 4  Pain Location: R hip Pain Descriptors / Indicators: Aching;Sore;Burning Pain Intervention(s): Limited activity within patient's tolerance;Monitored during session;Premedicated before session;Ice applied    Home Living Family/patient expects to be discharged to:: Private residence Living Arrangements: Spouse/significant other Available Help at Discharge: Family;Available 24 hours/day Type of Home: House Home Access: Stairs to enter Entrance Stairs-Rails: Left Entrance Stairs-Number of Steps: 4 Home Layout: One level Home Equipment: Crutches      Prior Function Level of Independence: Independent               Hand Dominance   Dominant Hand: Right    Extremity/Trunk Assessment   Upper Extremity Assessment Upper Extremity Assessment: Overall WFL for tasks assessed    Lower Extremity Assessment Lower Extremity Assessment: RLE deficits/detail RLE Deficits / Details: 2/5 strength at R hip with AAROM at hip to 80 flex and 15 abd    Cervical / Trunk Assessment Cervical / Trunk Assessment: Normal  Communication   Communication: No difficulties  Cognition Arousal/Alertness: Awake/alert Behavior During Therapy: WFL for tasks assessed/performed Overall Cognitive Status: Within Functional Limits for tasks assessed                                        General Comments      Exercises Total Joint Exercises Ankle  Circles/Pumps: AROM;Both;15 reps;Supine Quad Sets: AROM;Both;10 reps;Supine Heel Slides: AAROM;Right;10 reps;Supine Hip ABduction/ADduction: AAROM;Right;10 reps;Supine   Assessment/Plan    PT Assessment Patient needs continued PT services  PT Problem List Decreased strength;Decreased range of motion;Decreased activity tolerance;Decreased mobility;Decreased knowledge of use of DME;Pain       PT Treatment Interventions DME instruction;Gait training;Stair training;Functional mobility training;Therapeutic  activities;Therapeutic exercise;Patient/family education    PT Goals (Current goals can be found in the Care Plan section)  Acute Rehab PT Goals Patient Stated Goal: Less pain PT Goal Formulation: With patient Time For Goal Achievement: 02/17/18 Potential to Achieve Goals: Good    Frequency 7X/week   Barriers to discharge        Co-evaluation               AM-PAC PT "6 Clicks" Mobility  Outcome Measure Help needed turning from your back to your side while in a flat bed without using bedrails?: A Little Help needed moving from lying on your back to sitting on the side of a flat bed without using bedrails?: A Little Help needed moving to and from a bed to a chair (including a wheelchair)?: A Little Help needed standing up from a chair using your arms (e.g., wheelchair or bedside chair)?: A Little Help needed to walk in hospital room?: A Little Help needed climbing 3-5 steps with a railing? : A Little 6 Click Score: 18    End of Session Equipment Utilized During Treatment: Gait belt Activity Tolerance: Patient tolerated treatment well;Patient limited by pain Patient left: in bed;with call bell/phone within reach Nurse Communication: Mobility status PT Visit Diagnosis: Difficulty in walking, not elsewhere classified (R26.2)    Time: 6045-4098 PT Time Calculation (min) (ACUTE ONLY): 45 min   Charges:   PT Evaluation $PT Eval Low Complexity: 1 Low PT Treatments $Gait Training: 8-22 mins $Therapeutic Exercise: 8-22 mins        [name] PT Acute Rehabilitation Services Pager [phone] Office [phone]   [name] 02/10/2018, 1:11 PM

## 2018-02-10 NOTE — Plan of Care (Signed)
Patient unhappy with RN care d/t personality conflict and requested new RN.  Report given to new staff. Leadership notified and rounded.

## 2018-02-10 NOTE — Progress Notes (Signed)
Physical Therapy Treatment Patient Details Name: [name] MRN: [id number] DOB: 10/05/1965 Today's Date: 02/10/2018    History of Present Illness Pt s/p R THR and with hx of HIV and multiple L hip surgeries    PT Comments    Marked improvement in activity tolerance and demeanor from am session.  Pt states, much better pain control and increased confidence in ability to manage at home.   Follow Up Recommendations  Follow surgeon's recommendation for DC plan and follow-up therapies     Equipment Recommendations  Rolling walker with 5" wheels;3in1 (PT)    Recommendations for Other Services OT consult     Precautions / Restrictions Precautions Precautions: Fall Restrictions Weight Bearing Restrictions: No Other Position/Activity Restrictions: WBAT    Mobility  Bed Mobility Overal bed mobility: Needs Assistance Bed Mobility: Supine to Sit;Sit to Supine     Supine to sit: Min guard Sit to supine: Min guard   General bed mobility comments: Increased time with cues for sequence and use of L LE to self assist.  Transfers Overall transfer level: Needs assistance Equipment used: Rolling walker (2 wheeled) Transfers: Sit to/from Stand Sit to Stand: Min guard         General transfer comment: cues for LE management and use of UEs to self assist  Ambulation/Gait Ambulation/Gait assistance: Min assist;Min guard Gait Distance (Feet): 200 Feet Assistive device: Rolling walker (2 wheeled) Gait Pattern/deviations: Step-to pattern;Step-through pattern;Decreased step length - right;Decreased step length - left;Shuffle;Trunk flexed Gait velocity: decr   General Gait Details: cues for sequence, posture and position from RW   Stairs Stairs: Yes Stairs assistance: Min assist Stair Management: One rail Left;Step to pattern;Forwards;With crutches Number of Stairs: 6 General stair comments: cues for sequence and foot/crutch placement   Wheelchair Mobility     Modified Rankin (Stroke Patients Only)       Balance Overall balance assessment: Mild deficits observed, not formally tested                                          Cognition Arousal/Alertness: Awake/alert Behavior During Therapy: WFL for tasks assessed/performed Overall Cognitive Status: Within Functional Limits for tasks assessed                                        Exercises Total Joint Exercises Ankle Circles/Pumps: AROM;Both;15 reps;Supine Quad Sets: AROM;Both;10 reps;Supine Heel Slides: AAROM;Right;Supine;20 reps Hip ABduction/ADduction: AAROM;Right;Supine;15 reps    General Comments        Pertinent Vitals/Pain Pain Assessment: 0-10 Pain Score: 4  Pain Location: R hip Pain Descriptors / Indicators: Aching;Sore;Burning Pain Intervention(s): Limited activity within patient's tolerance;Monitored during session;Premedicated before session;Ice applied    Home Living Family/patient expects to be discharged to:: Private residence Living Arrangements: Spouse/significant other Available Help at Discharge: Family;Available 24 hours/day Type of Home: House Home Access: Stairs to enter Entrance Stairs-Rails: Left Home Layout: One level Home Equipment: Crutches      Prior Function Level of Independence: Independent          PT Goals (current goals can now be found in the care plan section) Acute Rehab PT Goals Patient Stated Goal: Less pain PT Goal Formulation: With patient Time For Goal Achievement: 02/17/18 Potential to Achieve Goals: Good Progress towards PT goals: Progressing toward goals  Frequency    7X/week      PT Plan Current plan remains appropriate    Co-evaluation              AM-PAC PT "6 Clicks" Mobility   Outcome Measure  Help needed turning from your back to your side while in a flat bed without using bedrails?: A Little Help needed moving from lying on your back to sitting on the side  of a flat bed without using bedrails?: A Little Help needed moving to and from a bed to a chair (including a wheelchair)?: A Little Help needed standing up from a chair using your arms (e.g., wheelchair or bedside chair)?: A Little Help needed to walk in hospital room?: A Little Help needed climbing 3-5 steps with a railing? : A Little 6 Click Score: 18    End of Session Equipment Utilized During Treatment: Gait belt Activity Tolerance: Patient tolerated treatment well Patient left: in bed;with call bell/phone within reach Nurse Communication: Mobility status PT Visit Diagnosis: Difficulty in walking, not elsewhere classified (R26.2)     Time: 5621-3086 PT Time Calculation (min) (ACUTE ONLY): 43 min  Charges:  $Gait Training: 8-22 mins $Therapeutic Exercise: 8-22 mins $Therapeutic Activity: 8-22 mins                     Mauro Kaufmann PT Acute Rehabilitation Services Pager [phone] Office [phone]    [name] 02/10/2018, 4:16 PM

## 2018-02-10 NOTE — Plan of Care (Signed)

## 2018-02-11 LAB — BASIC METABOLIC PANEL
ANION GAP: 4 — AB (ref 5–15)
BUN: 18 mg/dL (ref 6–20)
CALCIUM: 8.8 mg/dL — AB (ref 8.9–10.3)
CHLORIDE: 114 mmol/L — AB (ref 98–111)
CO2: 21 mmol/L — AB (ref 22–32)
Creatinine, Ser: 0.91 mg/dL (ref 0.61–1.24)
GFR calc non Af Amer: >60 mL/min (ref >60)
GLUCOSE: 135 mg/dL — AB (ref 70–99)
Potassium: 4 mmol/L (ref 3.5–5.1)
Sodium: 139 mmol/L (ref 135–145)

## 2018-02-11 LAB — PROTIME-INR
INR: 1.05
Prothrombin Time: 13.7 seconds (ref 11.4–15.2)

## 2018-02-11 LAB — CBC
HEMATOCRIT: 35.1 % — AB (ref 39.0–52.0)
HEMOGLOBIN: 11.6 g/dL — AB (ref 13.0–17.0)
MCH: 30.5 pg (ref 26.0–34.0)
MCHC: 33 g/dL (ref 30.0–36.0)
MCV: 92.4 fL (ref 80.0–100.0)
NRBC: 0 % (ref 0.0–0.2)
Platelets: 224 10*3/uL (ref 150–400)
RBC: 3.8 MIL/uL — ABNORMAL LOW (ref 4.22–5.81)
RDW: 13.1 % (ref 11.5–15.5)
WBC: 16.1 10*3/uL — AB (ref 4.0–10.5)

## 2018-02-11 MED ORDER — WARFARIN SODIUM 2.5 MG PO TABS: 2.5000 mg | ORAL_TABLET | Freq: Every day | ORAL | Status: DC

## 2018-02-11 NOTE — Progress Notes (Signed)
Physical Therapy Treatment Patient Details Name: Joseph ReekLarry D Sarria MRN: [id number] DOB: 07/16/1965 Today's Date: 02/11/2018    History of Present Illness Pt s/p R THR and with hx of HIV and multiple L hip surgeries    PT Comments    Pt progressing steadily with mobility and eager for dc home.  Pt reviewed car transfers and home therex with written instruction provided.     Follow Up Recommendations  Follow surgeon's recommendation for DC plan and follow-up therapies     Equipment Recommendations  Rolling walker with 5" wheels;3in1 (PT)    Recommendations for Other Services OT consult     Precautions / Restrictions Precautions Precautions: Fall Restrictions Weight Bearing Restrictions: No Other Position/Activity Restrictions: WBAT    Mobility  Bed Mobility Overal bed mobility: Needs Assistance Bed Mobility: Supine to Sit;Sit to Supine     Supine to sit: Supervision Sit to supine: Supervision   General bed mobility comments: Pt self assists with UEs and Leg Lifter  Transfers Overall transfer level: Needs assistance Equipment used: Rolling walker (2 wheeled) Transfers: Sit to/from Stand Sit to Stand: Supervision;Modified independent (Device/Increase time)         General transfer comment: min cues for LE management and use of UEs to self assist  Ambulation/Gait Ambulation/Gait assistance: Min guard;Supervision Gait Distance (Feet): 150 Feet Assistive device: Rolling walker (2 wheeled) Gait Pattern/deviations: Step-to pattern;Step-through pattern;Decreased step length - right;Decreased step length - left;Shuffle;Trunk flexed Gait velocity: decr   General Gait Details: cues for sequence, posture and position from RW   Stairs         General stair comments: pt states comfortable with ability after training yesterday   Wheelchair Mobility    Modified Rankin (Stroke Patients Only)       Balance Overall balance assessment: Mild deficits observed, not  formally tested                                          Cognition Arousal/Alertness: Awake/alert Behavior During Therapy: WFL for tasks assessed/performed Overall Cognitive Status: Within Functional Limits for tasks assessed                                        Exercises Total Joint Exercises Ankle Circles/Pumps: AROM;Both;15 reps;Supine Quad Sets: AROM;Both;10 reps;Supine Heel Slides: AAROM;Right;Supine;20 reps Hip ABduction/ADduction: AAROM;Right;Supine;15 reps Long Arc Quad: AROM;Right;10 reps;Seated    General Comments        Pertinent Vitals/Pain Pain Assessment: 0-10 Pain Score: 5  Pain Location: R hip and knee Pain Descriptors / Indicators: Aching;Sore;Burning Pain Intervention(s): Limited activity within patient's tolerance;Monitored during session;Premedicated before session;Ice applied    Home Living                      Prior Function            PT Goals (current goals can now be found in the care plan section) Acute Rehab PT Goals Patient Stated Goal: Regain IND PT Goal Formulation: With patient Time For Goal Achievement: 02/17/18 Potential to Achieve Goals: Good Progress towards PT goals: Progressing toward goals    Frequency    7X/week      PT Plan Current plan remains appropriate    Co-evaluation  AM-PAC PT "6 Clicks" Mobility   Outcome Measure  Help needed turning from your back to your side while in a flat bed without using bedrails?: None Help needed moving from lying on your back to sitting on the side of a flat bed without using bedrails?: None Help needed moving to and from a bed to a chair (including a wheelchair)?: None Help needed standing up from a chair using your arms (e.g., wheelchair or bedside chair)?: None Help needed to walk in hospital room?: None Help needed climbing 3-5 steps with a railing? : A Little 6 Click Score: 23    End of Session Equipment  Utilized During Treatment: Gait belt Activity Tolerance: Patient tolerated treatment well Patient left: in bed;with call bell/phone within reach Nurse Communication: Mobility status PT Visit Diagnosis: Difficulty in walking, not elsewhere classified (R26.2)     Time: 0930-1005 PT Time Calculation (min) (ACUTE ONLY): 35 min  Charges:  $Gait Training: 8-22 mins $Therapeutic Exercise: 8-22 mins                     Mauro Kaufmann PT Acute Rehabilitation Services Pager [phone] Office [phone]    Joretta Eads 02/11/2018, 12:28 PM

## 2018-02-11 NOTE — Progress Notes (Signed)
Patient ID: [name], male   DOB: 09/26/1965, 52 y.o.   MRN: [id number] Subjective: 2 Days Post-Op Procedure(s) (LRB): TOTAL HIP ARTHROPLASTY ANTERIOR APPROACH (Right)    Patient reports pain as mild.  Much improved from yesterday.  Did well with therapy.  Ready to head home today  Objective:   VITALS:   Vitals:   02/10/18 2058 02/11/18 0525  BP: 129/76 131/90  Pulse: 80 78  Resp: 16 16  Temp: 98.3 F (36.8 C) 97.7 F (36.5 C)  SpO2: 98% 98%    Neurovascular intact Incision: dressing C/D/I  LABS Recent Labs    02/10/18 0527 02/11/18 0523  HGB 11.7* 11.6*  HCT 35.2* 35.1*  WBC 13.2* 16.1*  PLT 232 224    Recent Labs    02/10/18 0527 02/11/18 0523  NA 138 139  K 3.7 4.0  BUN 19 18  CREATININE 1.04 0.91  GLUCOSE 141* 135*    Recent Labs    02/10/18 0527 02/11/18 0523  INR 0.99 1.05     Assessment/Plan: 2 Days Post-Op Procedure(s) (LRB): TOTAL HIP ARTHROPLASTY ANTERIOR APPROACH (Right)   Up with therapy  Home today after therapy RTC in 2 weeks Reviewed goals PO antibiotics at discharge due to high risk nature of this procedure and medical co-morbidities

## 2018-02-11 NOTE — Progress Notes (Signed)
ANTICOAGULATION CONSULT NOTE   Pharmacy Consult for warfarin Indication: VTE prophylaxis  Allergies  Allergen Reactions  . Hydrocodone Other (See Comments)    "makes me overly sleepy"  . Sulfa Antibiotics Rash  . Tramadol Nausea Only    Patient Measurements: Height: 6\' 1"  (185.4 cm) Weight: 218 lb 4.1 oz (99 kg) IBW/kg (Calculated) : 79.9   Vital Signs: Temp: 97.7 F (36.5 C) (11/28 0525) Temp Source: Oral (11/28 0525) BP: 131/90 (11/28 0525) Pulse Rate: 78 (11/28 0525)  Labs: Recent Labs    02/10/18 0527 02/11/18 0523  HGB 11.7* 11.6*  HCT 35.2* 35.1*  PLT 232 224  LABPROT 12.9 13.7  INR 0.99 1.05  CREATININE 1.04 0.91    Estimated Creatinine Clearance: 120.2 mL/min (by C-G formula based on SCr of 0.91 mg/dL).   Assessment: Patient's a 52 y.o. M with hx PE (in 2015) and HIV s/p right THR on 11/26 with xarelto ordered post-op for VTE prophylaxis. Patient is on Symtuza for HIV and the Cobicisctat component  has significant drug-drug intxns with xarelto. D/W Marriottmber Constable (PA) - to change anticoag. to warfarin as drug level can be monitored with INR.  Amber said to start warfarin on 11/27.   Baseline INR normal as expected  Prior anticoagulation: none  Significant events:  Today, 02/11/2018:  CBC: H&H slightly low as expected postop; Plt wnl  INR subtherapeutic  drug interactions: darunavir, cobicisctat, and esomeprazole  No bleeding issues reported  Goal of Therapy: INR 2-3  Plan:  Warfarin 2.5 mg daily until INR trend more apparent due to potential drug interactions  Daily INR  CBC at least q72 hr while on warfarin  Monitor for signs of bleeding or thrombosis   Luisa HartScott Finesse Fielder, PharmD Clinical Pharmacist Pager # [phone]  02/11/2018, 12:10 PM

## 2018-02-12 NOTE — Anesthesia Postprocedure Evaluation (Signed)
Anesthesia Post Note  Patient: [name]  Procedure(s) Performed: TOTAL HIP ARTHROPLASTY ANTERIOR APPROACH (Right Hip)     Patient location during evaluation: PACU Anesthesia Type: Spinal Level of consciousness: oriented and awake and alert Pain management: pain level controlled Vital Signs Assessment: post-procedure vital signs reviewed and stable Respiratory status: spontaneous breathing, respiratory function stable and patient connected to nasal cannula oxygen Cardiovascular status: blood pressure returned to baseline and stable Postop Assessment: no headache, no backache and no apparent nausea or vomiting Anesthetic complications: no    Last Vitals:  Vitals:   02/10/18 2058 02/11/18 0525  BP: 129/76 131/90  Pulse: 80 78  Resp: 16 16  Temp: 36.8 C 36.5 C  SpO2: 98% 98%    Last Pain:  Vitals:   02/11/18 0525  TempSrc: Oral  PainSc:                  Kena Limon S

## 2018-02-15 ENCOUNTER — Telehealth: Payer: Self-pay | Admitting: *Deleted

## 2018-02-15 NOTE — Telephone Encounter (Signed)
Patient left message in triage asking for refill of gabapentin.  He had surgery last week, was last given a fill 11/11 by Marcos EkeGreg Calone. Andree CossHowell, Tashianna Broome M, RN

## 2018-02-16 NOTE — Discharge Summary (Signed)
Physician Discharge Summary  Patient ID: [name] MRN: [id number] DOB/AGE: [birth year] 52 y.o.  Admit date: 02/09/2018 Discharge date: 02/11/2018   Procedures:  Procedure(s) (LRB): TOTAL HIP ARTHROPLASTY ANTERIOR APPROACH (Right)  Attending Physician:  Dr. Durene Romans   Admission Diagnoses:   Right hip primary OA / pain  Discharge Diagnoses:  Principal Problem:   S/P right THA, AA Active Problems:   S/P hip replacement  Past Medical History:  Diagnosis Date  . Anxiety   . Arthritis   . Asthma   . Depression   . HIV (human immunodeficiency virus infection) (HCC)   . Immune deficiency disorder (HCC)   . MRSA (methicillin resistant staph aureus) culture positive   . Streptococcal pneumonia (HCC)     HPI:    [name], 52 y.o. male, has a history of pain and functional disability in the right hip(s) due to arthritis and patient has failed non-surgical conservative treatments for greater than 12 weeks to include NSAID's and/or analgesics, use of assistive devices and activity modification.  Onset of symptoms was gradual starting 19 years ago with gradually worsening course since that time.The patient noted prior procedures of the hip to include arthroplasty on the left hip in 2015.  Patient currently rates pain in the right hip at 10 out of 10 with activity. Patient has night pain, worsening of pain with activity and weight bearing, trendelenberg gait, pain that interfers with activities of daily living and pain with passive range of motion. Patient has evidence of periarticular osteophytes and joint space narrowing by imaging studies. This condition presents safety issues increasing the risk of falls.  There is no current active infection.  Risks, benefits and expectations were discussed with the patient.  Risks including but not limited to the risk of anesthesia, blood clots, nerve damage, blood vessel damage, failure of the prosthesis, infection and up to and  including death.  Patient understand the risks, benefits and expectations and wishes to proceed with surgery.   PCP: Department, [facility]   Discharged Condition: good  Hospital Course:  Patient underwent the above stated procedure on 02/09/2018. Patient tolerated the procedure well and brought to the recovery room in good condition and subsequently to the floor.  POD #1 BP: 117/73 ; Pulse: 73 ; Temp: 98.2 F (36.8 C) ; Resp: 20 Patient reports pain as severe, states that this is the worse pain he has had with any of his hip surgeries.  Meds were changed a couple of times throughout the night. This morning some issues with pain, but change of meds has improved his pain significantly.  Now stating that he would feel more comfortable to d/c home tomorrow due to pain issues.  Dorsiflexion/plantar flexion intact, incision: dressing C/D/I, no cellulitis present and compartment soft.   LABS  Basename    HGB     11.7  HCT     35.2   POD #2  BP: 131/90 ; Pulse: 78 ; Temp: 97.7 F (36.5 C) ; Resp: 16 Patient reports pain as mild.  Much improved from yesterday.  Did well with therapy.  Ready to head home today. Neurovascular intact and incision: dressing C/D/I.   LABS  Basename    HGB     11.6  HCT     35.1    Discharge Exam: General appearance: alert, cooperative and no distress Extremities: Homans sign is negative, no sign of DVT, no edema, redness or tenderness in the calves or thighs and no  ulcers, gangrene or trophic changes  Disposition:  Home with follow up in 2 weeks   Follow-up Information    Lanney Gins, Cordelia Poche. Go on 02/24/2018.   Specialty:  Orthopedic Surgery Why:  You are scheduled for a post-op appointment with [name], PA-C at Dr. Nilsa Nutting office on 02/24/18 at 3:30 pm. Contact information: [address] [place] [address] [phone]           Discharge Instructions    Call MD / Call 911   Complete by:  As directed    If  you experience chest pain or shortness of breath, CALL 911 and be transported to the hospital emergency room.  If you develope a fever above 101 F, pus (white drainage) or increased drainage or redness at the wound, or calf pain, call your surgeon's office.   Call MD / Call 911   Complete by:  As directed    If you experience chest pain or shortness of breath, CALL 911 and be transported to the hospital emergency room.  If you develope a fever above 101 F, pus (white drainage) or increased drainage or redness at the wound, or calf pain, call your surgeon's office.   Change dressing   Complete by:  As directed    Maintain surgical dressing until follow up in the clinic. If the edges start to pull up, may reinforce with tape. If the dressing is no longer working, may remove and cover with gauze and tape, but must keep the area dry and clean.  Call with any questions or concerns.   Constipation Prevention   Complete by:  As directed    Drink plenty of fluids.  Prune juice may be helpful.  You may use a stool softener, such as Colace (over the counter) 100 mg twice a day.  Use MiraLax (over the counter) for constipation as needed.   Constipation Prevention   Complete by:  As directed    Drink plenty of fluids.  Prune juice may be helpful.  You may use a stool softener, such as Colace (over the counter) 100 mg twice a day.  Use MiraLax (over the counter) for constipation as needed.   Diet - low sodium heart healthy   Complete by:  As directed    Diet - low sodium heart healthy   Complete by:  As directed    Discharge instructions   Complete by:  As directed    Maintain surgical dressing until follow up in the clinic. If the edges start to pull up, may reinforce with tape. If the dressing is no longer working, may remove and cover with gauze and tape, but must keep the area dry and clean.  Follow up in 2 weeks at [facility]. Call with any questions or concerns.   Discharge instructions    Complete by:  As directed    INSTRUCTIONS AFTER JOINT REPLACEMENT   Remove items at home which could result in a fall. This includes throw rugs or furniture in walking pathways ICE to the affected joint every three hours while awake for 30 minutes at a time, for at least the first 3-5 days, and then as needed for pain and swelling.  Continue to use ice for pain and swelling. You may notice swelling that will progress down to the foot and ankle.  This is normal after surgery.  Elevate your leg when you are not up walking on it.   Continue to use the breathing machine you got  in the hospital (incentive spirometer) which will help keep your temperature down.  It is common for your temperature to cycle up and down following surgery, especially at night when you are not up moving around and exerting yourself.  The breathing machine keeps your lungs expanded and your temperature down.   DIET:  As you were doing prior to hospitalization, we recommend a well-balanced diet.  DRESSING / WOUND CARE / SHOWERING  Keep the surgical dressing until follow up.  The dressing is water proof, so you can shower without any extra covering.  IF THE DRESSING FALLS OFF or the wound gets wet inside, change the dressing with sterile gauze.  Please use good hand washing techniques before changing the dressing.  Do not use any lotions or creams on the incision until instructed by your surgeon.    ACTIVITY  Increase activity slowly as tolerated, but follow the weight bearing instructions below.   No driving for 6 weeks or until further direction given by your physician.  You cannot drive while taking narcotics.  No lifting or carrying greater than 10 lbs. until further directed by your surgeon. Avoid periods of inactivity such as sitting longer than an hour when not asleep. This helps prevent blood clots.  You may return to work once you are authorized by your doctor.     WEIGHT BEARING   Weight bearing as tolerated  with assist device (walker, cane, etc) as directed, use it as long as suggested by your surgeon or therapist, typically at least 4-6 weeks.   EXERCISES  Results after joint replacement surgery are often greatly improved when you follow the exercise, range of motion and muscle strengthening exercises prescribed by your doctor. Safety measures are also important to protect the joint from further injury. Any time any of these exercises cause you to have increased pain or swelling, decrease what you are doing until you are comfortable again and then slowly increase them. If you have problems or questions, call your caregiver or physical therapist for advice.   Rehabilitation is important following a joint replacement. After just a few days of immobilization, the muscles of the leg can become weakened and shrink (atrophy).  These exercises are designed to build up the tone and strength of the thigh and leg muscles and to improve motion. Often times heat used for twenty to thirty minutes before working out will loosen up your tissues and help with improving the range of motion but do not use heat for the first two weeks following surgery (sometimes heat can increase post-operative swelling).   These exercises can be done on a training (exercise) mat, on the floor, on a table or on a bed. Use whatever works the best and is most comfortable for you.    Use music or television while you are exercising so that the exercises are a pleasant break in your day. This will make your life better with the exercises acting as a break in your routine that you can look forward to.   Perform all exercises about fifteen times, three times per day or as directed.  You should exercise both the operative leg and the other leg as well.   Exercises include:   Quad Sets - Tighten up the muscle on the front of the thigh (Quad) and hold for 5-10 seconds.   Straight Leg Raises - With your knee straight (if you were given a brace,  keep it on), lift the leg to 60 degrees, hold for 3  seconds, and slowly lower the leg.  Perform this exercise against resistance later as your leg gets stronger.  Leg Slides: Lying on your back, slowly slide your foot toward your buttocks, bending your knee up off the floor (only go as far as is comfortable). Then slowly slide your foot back down until your leg is flat on the floor again.  Angel Wings: Lying on your back spread your legs to the side as far apart as you can without causing discomfort.  Hamstring Strength:  Lying on your back, push your heel against the floor with your leg straight by tightening up the muscles of your buttocks.  Repeat, but this time bend your knee to a comfortable angle, and push your heel against the floor.  You may put a pillow under the heel to make it more comfortable if necessary.   A rehabilitation program following joint replacement surgery can speed recovery and prevent re-injury in the future due to weakened muscles. Contact your doctor or a physical therapist for more information on knee rehabilitation.    CONSTIPATION  Constipation is defined medically as fewer than three stools per week and severe constipation as less than one stool per week.  Even if you have a regular bowel pattern at home, your normal regimen is likely to be disrupted due to multiple reasons following surgery.  Combination of anesthesia, postoperative narcotics, change in appetite and fluid intake all can affect your bowels.   YOU MUST use at least one of the following options; they are listed in order of increasing strength to get the job done.  They are all available over the counter, and you may need to use some, POSSIBLY even all of these options:    Drink plenty of fluids (prune juice may be helpful) and high fiber foods Colace 100 mg by mouth twice a day  Senokot for constipation as directed and as needed Dulcolax (bisacodyl), take with full glass of water  Miralax (polyethylene  glycol) once or twice a day as needed.  If you have tried all these things and are unable to have a bowel movement in the first 3-4 days after surgery call either your surgeon or your primary doctor.    If you experience loose stools or diarrhea, hold the medications until you stool forms back up.  If your symptoms do not get better within 1 week or if they get worse, check with your doctor.  If you experience "the worst abdominal pain ever" or develop nausea or vomiting, please contact the office immediately for further recommendations for treatment.   ITCHING:  If you experience itching with your medications, try taking only a single pain pill, or even half a pain pill at a time.  You can also use Benadryl over the counter for itching or also to help with sleep.   TED HOSE STOCKINGS:  Use stockings on both legs until for at least 2 weeks or as directed by physician office. They may be removed at night for sleeping.  MEDICATIONS:  See your medication summary on the "After Visit Summary" that nursing will review with you.  You may have some home medications which will be placed on hold until you complete the course of blood thinner medication.  It is important for you to complete the blood thinner medication as prescribed.  PRECAUTIONS:  If you experience chest pain or shortness of breath - call 911 immediately for transfer to the hospital emergency department.   If you develop a fever  greater that 101 F, purulent drainage from wound, increased redness or drainage from wound, foul odor from the wound/dressing, or calf pain - CONTACT YOUR SURGEON.                                                   FOLLOW-UP APPOINTMENTS:  If you do not already have a post-op appointment, please call the office for an appointment to be seen by your surgeon.  Guidelines for how soon to be seen are listed in your "After Visit Summary", but are typically between 1-4 weeks after surgery.  OTHER INSTRUCTIONS:   Knee  Replacement:  Do not place pillow under knee, focus on keeping the knee straight while resting. CPM instructions: 0-90 degrees, 2 hours in the morning, 2 hours in the afternoon, and 2 hours in the evening. Place foam block, curve side up under heel at all times except when in CPM or when walking.  DO NOT modify, tear, cut, or change the foam block in any way.  MAKE SURE YOU:  Understand these instructions.  Get help right away if you are not doing well or get worse.    Thank you for letting us be a part of your medical care team.  It is a privilege we respect greatly.  We hope these instructions will help you stay on track for a fast and full recovery!   Increase activity slowly as tolerated   Complete by:  As directed    Weight bearing as tolerated with assist device (walker, cane, etc) as directed, use it as long as suggested by your surgeon or therapist, typically at least 4-6 weeks.   Increase activity slowly as tolerated   Complete by:  As directed    TED hose   Complete by:  As directed    Use stockings (TED hose) for 2 weeks on both leg(s).  You may remove them at night for sleeping.      Allergies as of 02/11/2018      Reactions   Hydrocodone Other (See Comments)   "makes me overly sleepy"   Sulfa Antibiotics Rash   Tramadol Nausea Only      Medication List    STOP taking these medications   meloxicam 7.5 MG tablet Commonly known as:  MOBIC     TAKE these medications   acetaminophen 500 MG tablet Commonly known as:  TYLENOL Take 2 tablets (1,000 mg total) by mouth every 8 (eight) hours.   albuterol 108 (90 Base) MCG/ACT inhaler Commonly known as:  PROVENTIL HFA;VENTOLIN HFA Inhale 2 puffs into the lungs every 6 (six) hours as needed for wheezing or shortness of breath.   Darunavir-Cobicisctat-Emtricitabine-Tenofovir Alafenamide 800-150-200-10 MG Tabs Commonly known as:  SYMTUZA Take 1 tablet by mouth daily with breakfast.   docusate sodium 100 MG  capsule Commonly known as:  COLACE Take 1 capsule (100 mg total) by mouth 2 (two) times daily.   dolutegravir 50 MG tablet Commonly known as:  TIVICAY Take 1 tablet (50 mg total) by mouth daily.   doxycycline 100 MG capsule Commonly known as:  VIBRAMYCIN Take 1 capsule (100 mg total) by mouth 2 (two) times daily.   enoxaparin 40 MG/0.4ML injection Commonly known as:  LOVENOX Inject 0.4 mLs (40 mg total) into the skin daily.   esomeprazole 40 MG capsule Commonly known as:  NEXIUM  Take 1 capsule (40 mg total) by mouth daily at 12 noon. What changed:    when to take this  reasons to take this   ferrous sulfate 325 (65 FE) MG tablet Take 1 tablet (325 mg total) by mouth 3 (three) times daily with meals.   gabapentin 600 MG tablet Commonly known as:  NEURONTIN Take 2 tablets (1,200 mg total) by mouth 2 (two) times daily. What changed:    how much to take  when to take this   MENS MULTIVITAMIN PLUS PO Take 1 tablet by mouth 2 (two) times daily.   methocarbamol 500 MG tablet Commonly known as:  ROBAXIN Take 1 tablet (500 mg total) by mouth every 6 (six) hours as needed for muscle spasms.   multivitamin with minerals Tabs tablet Take 1 tablet by mouth 2 (two) times daily. Centrum   Oxycodone HCl 10 MG Tabs Take 1-2 tablets (10-20 mg total) by mouth every 4 (four) hours as needed for moderate pain or severe pain.   Oxycodone HCl 10 MG Tabs Take 1-2 tablets (10-20 mg total) by mouth every 4 (four) hours as needed for moderate pain or severe pain.   polyethylene glycol packet Commonly known as:  MIRALAX / GLYCOLAX Take 17 g by mouth 2 (two) times daily.   prenatal multivitamin Tabs tablet Take 1 tablet by mouth 2 (two) times daily.   valACYclovir 500 MG tablet Commonly known as:  VALTREX Take 1 tablet (500 mg total) by mouth daily.            Discharge Care Instructions  (From admission, onward)         Start     Ordered   02/10/18 0000  Change dressing     Comments:  Maintain surgical dressing until follow up in the clinic. If the edges start to pull up, may reinforce with tape. If the dressing is no longer working, may remove and cover with gauze and tape, but must keep the area dry and clean.  Call with any questions or concerns.   02/10/18 [id number]           Signed: Anastasio Auerbach. Hiroki Wint   PA-C  02/16/2018, 3:21 PM

## 2018-03-01 ENCOUNTER — Other Ambulatory Visit: Payer: Self-pay | Admitting: Infectious Disease

## 2018-03-01 DIAGNOSIS — B20 Human immunodeficiency virus [HIV] disease: Secondary | ICD-10-CM

## 2018-03-08 ENCOUNTER — Other Ambulatory Visit: Payer: Medicare Other

## 2018-03-08 DIAGNOSIS — B20 Human immunodeficiency virus [HIV] disease: Secondary | ICD-10-CM

## 2018-03-08 DIAGNOSIS — Z21 Asymptomatic human immunodeficiency virus [HIV] infection status: Secondary | ICD-10-CM

## 2018-03-09 LAB — T-HELPER CELL (CD4) - (RCID CLINIC ONLY)
CD4 % Helper T Cell: 14 % — ABNORMAL LOW (ref 33–55)
CD4 T Cell Abs: 200 /uL — ABNORMAL LOW (ref 400–2700)

## 2018-03-13 LAB — HIV-1 RNA QUANT-NO REFLEX-BLD
HIV 1 RNA Quant: <20 copies/mL — AB
HIV-1 RNA Quant, Log: <1.3 Log copies/mL — AB

## 2018-03-13 LAB — HEPATITIS B SURFACE ANTIGEN: HEP B S AG: NONREACTIVE

## 2018-03-13 LAB — RPR: RPR: NONREACTIVE

## 2018-03-13 LAB — HEPATITIS B SURFACE ANTIBODY,QUALITATIVE: Hep B S Ab: NONREACTIVE

## 2018-03-15 ENCOUNTER — Telehealth: Payer: Self-pay | Admitting: Behavioral Health

## 2018-03-15 NOTE — Telephone Encounter (Signed)
Called patient verified identity with name, date of birth date, address.  Informed him per [name], his viral load remains suppressed with CD4 count of 200.  Informed him as long as the virus remains suppressed at this time he does not need antibiotics.  Will continue to monitor and [name] will follow up on 04/05/2018.  Patient verbalized understanding. Angeline SlimAshley Hill RN

## 2018-03-15 NOTE — Telephone Encounter (Signed)
-----   Message from Veryl SpeakGregory D Calone, FNP sent at 03/15/2018  8:06 AM EST ----- Please inform Mr. Pardue that his viral load remains suppressed with a CD4 count of 200. As long as his virus remains suppressed at this point there is no need for antibiotics. We will continue to monitor. Follow up on 1/20 as scheduled.

## 2018-03-18 ENCOUNTER — Other Ambulatory Visit: Payer: Self-pay | Admitting: Family

## 2018-03-18 DIAGNOSIS — B20 Human immunodeficiency virus [HIV] disease: Secondary | ICD-10-CM

## 2018-03-18 MED ORDER — GABAPENTIN 600 MG PO TABS: 1200.0000 mg | ORAL_TABLET | Freq: Two times a day (BID) | ORAL | 0 refills | Status: DC

## 2018-03-18 NOTE — Telephone Encounter (Signed)
Refill of gabapentin sent.

## 2018-03-28 ENCOUNTER — Other Ambulatory Visit (HOSPITAL_COMMUNITY): Payer: Self-pay | Admitting: Family Medicine

## 2018-04-05 ENCOUNTER — Other Ambulatory Visit: Payer: Self-pay | Admitting: Family

## 2018-04-05 ENCOUNTER — Ambulatory Visit (INDEPENDENT_AMBULATORY_CARE_PROVIDER_SITE_OTHER): Payer: Medicare Other | Admitting: Family

## 2018-04-05 ENCOUNTER — Encounter: Payer: Self-pay | Admitting: Family

## 2018-04-05 VITALS — BP 166/90 | HR 67 | Temp 98.3°F | Ht 73.0 in | Wt 226.0 lb

## 2018-04-05 DIAGNOSIS — M160 Bilateral primary osteoarthritis of hip: Secondary | ICD-10-CM

## 2018-04-05 DIAGNOSIS — B20 Human immunodeficiency virus [HIV] disease: Secondary | ICD-10-CM | POA: Diagnosis not present

## 2018-04-05 MED ORDER — DARUN-COBIC-EMTRICIT-TENOFAF 800-150-200-10 MG PO TABS: 1.0000 | ORAL_TABLET | Freq: Every day | ORAL | 3 refills | Status: DC

## 2018-04-05 MED ORDER — DOLUTEGRAVIR SODIUM 50 MG PO TABS: 50.0000 mg | ORAL_TABLET | Freq: Every day | ORAL | 3 refills | Status: DC

## 2018-04-05 MED ORDER — ALBUTEROL SULFATE HFA 108 (90 BASE) MCG/ACT IN AERS: 2.0000 | INHALATION_SPRAY | Freq: Four times a day (QID) | RESPIRATORY_TRACT | 2 refills | Status: DC | PRN

## 2018-04-05 MED ORDER — GABAPENTIN 600 MG PO TABS: 1200.0000 mg | ORAL_TABLET | Freq: Three times a day (TID) | ORAL | 1 refills | Status: DC

## 2018-04-05 NOTE — Assessment & Plan Note (Signed)
Mr. Rosendale has well-controlled HIV disease with his current antiretroviral regimen of Tivicay and Symtuza with good adherence and tolerance and no adverse side effects.  No current signs/symptoms of opportunistic infection or progressive HIV disease.  Continue current dose of Symtuza and Tivicay.  Follow-up office visit in 3 months or sooner if needed with blood work 1 to 2 weeks prior to office visit.

## 2018-04-05 NOTE — Patient Instructions (Signed)
Nice to see you.  Continue to take your Tivicay and Symtuza as prescribed.  Plan for office follow up office visit in 3 months or sooner if needed with lab work 1-2 weeks prior to appointment.

## 2018-04-05 NOTE — Assessment & Plan Note (Signed)
[name] has primary osteoarthritis and is now s/p osteoarthritis of bilateral hips with continued neuropathic pains that are adequately controlled with gabapentin. No adverse side effects. Continue current dose of gabapentin.

## 2018-04-05 NOTE — Progress Notes (Signed)
Subjective:    Patient ID: [name], male    DOB: [birth year], 53 y.o.   MRN: [id number]  Chief Complaint  Patient presents with  . HIV Positive/AIDS     HPI:  [name] is a 53 y.o. male who presents today for routine follow up of HIV disease.  [name] was last seen in the office on 02/08/2018 for routine follow-up of HIV disease with good adherence and tolerance of his regimen of Symtuza and Tivicay.  Viral load at the time was noted to be undetectable with a CD4 count of 270.  Most recent blood work completed on 03/08/2018 shows continued viral suppression remaining undetectable with a CD4 count of 200.  Health maintenance due includes colon cancer screening and dental exam.  [name] has been taking his Tivicay and Symtuza as prescribed with no adverse side effects or missed doses.  He has no problems obtaining his medication and continues to have coverage through Medicare/Medicaid.  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  He is no longer taking oxycodone, however continues to take gabapentin which does help with his pain.  He remains able to function and perform activities of daily living with his current medication regimen.  Denies fevers, chills, night sweats, headaches, changes in vision, neck pain/stiffness, nausea, diarrhea, vomiting, lesions or rashes.     Allergies  Allergen Reactions  . Hydrocodone Other (See Comments)    "makes me overly sleepy"  . Sulfa Antibiotics Rash  . Tramadol Nausea Only      Outpatient Medications Prior to Visit  Medication Sig Dispense Refill  . acetaminophen (TYLENOL) 500 MG tablet Take 2 tablets (1,000 mg total) by mouth every 8 (eight) hours. 30 tablet 0  . docusate sodium (COLACE) 100 MG capsule Take 1 capsule (100 mg total) by mouth 2 (two) times daily. 10 capsule 0  . esomeprazole (NEXIUM) 40 MG capsule Take 1 capsule (40 mg total) by mouth daily at 12 noon. (Patient  taking differently: Take 40 mg by mouth daily as needed (heartburn). ) 30 capsule 2  . ferrous sulfate (FERROUSUL) 325 (65 FE) MG tablet Take 1 tablet (325 mg total) by mouth 3 (three) times daily with meals.  3  . methocarbamol (ROBAXIN) 500 MG tablet Take 1 tablet (500 mg total) by mouth every 6 (six) hours as needed for muscle spasms. 40 tablet 0  . Multiple Vitamin (MULTIVITAMIN WITH MINERALS) TABS tablet Take 1 tablet by mouth 2 (two) times daily. Centrum     . Multiple Vitamins-Minerals (MENS MULTIVITAMIN PLUS PO) Take 1 tablet by mouth 2 (two) times daily.    . polyethylene glycol (MIRALAX / GLYCOLAX) packet Take 17 g by mouth 2 (two) times daily. 14 each 0  . Prenatal Vit-Fe Fumarate-FA (PRENATAL MULTIVITAMIN) TABS tablet Take 1 tablet by mouth 2 (two) times daily.     . valACYclovir (VALTREX) 500 MG tablet Take 1 tablet (500 mg total) by mouth daily. 30 tablet 11  . albuterol (PROVENTIL HFA;VENTOLIN HFA) 108 (90 Base) MCG/ACT inhaler Inhale 2 puffs into the lungs every 6 (six) hours as needed for wheezing or shortness of breath. 1 Inhaler 2  . Darunavir-Cobicisctat-Emtricitabine-Tenofovir Alafenamide (SYMTUZA) 800-150-200-10 MG TABS Take 1 tablet by mouth daily with breakfast. 30 tablet 3  . dolutegravir (TIVICAY) 50 MG tablet Take 1 tablet (50 mg total) by mouth daily. 30 tablet 3  . gabapentin (NEURONTIN) 600 MG tablet Take 2 tablets (1,200 mg total) by  mouth 2 (two) times daily. 120 tablet 0  . doxycycline (VIBRAMYCIN) 100 MG capsule Take 1 capsule (100 mg total) by mouth 2 (two) times daily. (Patient not taking: Reported on 04/05/2018) 60 capsule 0  . enoxaparin (LOVENOX) 40 MG/0.4ML injection Inject 0.4 mLs (40 mg total) into the skin daily. (Patient not taking: Reported on 04/05/2018) 14 Syringe 0  . oxyCODONE 10 MG TABS Take 1-2 tablets (10-20 mg total) by mouth every 4 (four) hours as needed for moderate pain or severe pain. (Patient not taking: Reported on 04/05/2018) 60 tablet 0  .  oxyCODONE 10 MG TABS Take 1-2 tablets (10-20 mg total) by mouth every 4 (four) hours as needed for moderate pain or severe pain. (Patient not taking: Reported on 04/05/2018) 60 tablet 0   No facility-administered medications prior to visit.      Past Medical History:  Diagnosis Date  . Anxiety   . Arthritis   . Asthma   . Depression   . History of Clostridium difficile colitis 04/22/2017  . History of pulmonary embolus (PE) 03/17/2013   Overview:  post hip surgery, on blood thinners at the time  . HIV (human immunodeficiency virus infection) (HCC)   . Hypokalemia   . Hypotension 01/25/2015  . Immune deficiency disorder (HCC)   . Infectious folliculitis 01/30/2017  . MRSA (methicillin resistant staph aureus) culture positive   . Sepsis (HCC)   . Streptococcal pneumonia [facility](HCC)      Past Surgical History:  Procedure Laterality Date  . 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 TOTAL HIP ARTHROPLASTY Right 02/09/2018   Procedure: TOTAL HIP ARTHROPLASTY ANTERIOR APPROACH;  Surgeon: Durene Romanslin, Matthew, MD;  Location: [facility];  Service: Orthopedics;  Laterality: Right;  70 mins       Review of Systems  Constitutional: Negative for appetite change, chills, fatigue, fever and unexpected weight change.  Eyes: Negative for visual disturbance.  Respiratory: Negative for cough, chest tightness, shortness of breath and wheezing.   Cardiovascular: Negative for chest pain and leg swelling.  Gastrointestinal: Negative for abdominal pain, constipation, diarrhea, nausea and vomiting.  Genitourinary: Negative for dysuria, flank pain, frequency, genital sores, hematuria and urgency.  Skin: Negative for rash.  Allergic/Immunologic: Negative for immunocompromised state.  Neurological: Negative for dizziness and headaches.      Objective:    BP (!) 166/90   Pulse 67   Temp 98.3 F (36.8 C) (Oral)   Ht 6\' 1"  (1.854 m)   Wt 226 lb (102.5 kg)    BMI 29.82 kg/m  Nursing note and vital signs reviewed.  Physical Exam Constitutional:      General: He is not in acute distress.    Appearance: He is well-developed.  Eyes:     Conjunctiva/sclera: Conjunctivae normal.  Neck:     Musculoskeletal: Neck supple.  Cardiovascular:     Rate and Rhythm: Normal rate and regular rhythm.     Heart sounds: Normal heart sounds. No murmur. No friction rub. No gallop.   Pulmonary:     Effort: Pulmonary effort is normal. No respiratory distress.     Breath sounds: Normal breath sounds. No wheezing or rales.  Chest:     Chest wall: No tenderness.  Abdominal:     General: Bowel sounds are normal.     Palpations: Abdomen is soft.     Tenderness: There is no abdominal tenderness.  Lymphadenopathy:     Cervical: No cervical adenopathy.  Skin:    General: Skin is warm and dry.     Findings: No rash.  Neurological:     Mental Status: He is alert and oriented to person, place, and time.  Psychiatric:        Behavior: Behavior normal.        Thought Content: Thought content normal.        Judgment: Judgment normal.        Assessment & Plan:   Problem List Items Addressed This Visit      Musculoskeletal and Integument   Primary osteoarthritis of both hips - Primary    [name] has primary osteoarthritis and is now s/p osteoarthritis of bilateral hips with continued neuropathic pains that are adequately controlled with gabapentin. No adverse side effects. Continue current dose of gabapentin.         Other   HIV (human immunodeficiency virus infection) [facility])    [name] has well-controlled HIV disease with his current antiretroviral regimen of Tivicay and Symtuza with good adherence and tolerance and no adverse side effects.  No current signs/symptoms of opportunistic infection or progressive HIV disease.  Continue current dose of Symtuza and Tivicay.  Follow-up office visit in 3 months or sooner if needed with blood work 1 to 2  weeks prior to office visit.      Relevant Medications   dolutegravir (TIVICAY) 50 MG tablet   Darunavir-Cobicisctat-Emtricitabine-Tenofovir Alafenamide (SYMTUZA) 800-150-200-10 MG TABS   gabapentin (NEURONTIN) 600 MG tablet   albuterol (PROVENTIL HFA;VENTOLIN HFA) 108 (90 Base) MCG/ACT inhaler   Other Relevant Orders   T-helper cell (CD4)- ([facility] clinic only)   HIV-1 RNA quant-no reflex-bld   CBC   Comprehensive metabolic panel   Lipid panel   RPR       I have discontinued Theophilus Kinds. Mersereau's doxycycline, enoxaparin, Oxycodone HCl, and Oxycodone HCl. I have also changed his gabapentin. Additionally, I am having him maintain his valACYclovir, prenatal multivitamin, multivitamin with minerals, esomeprazole, Multiple Vitamins-Minerals (MENS MULTIVITAMIN PLUS PO), ferrous sulfate, docusate sodium, polyethylene glycol, methocarbamol, acetaminophen, dolutegravir, Darunavir-Cobicisctat-Emtricitabine-Tenofovir Alafenamide, and albuterol.   Meds ordered this encounter  Medications  . dolutegravir (TIVICAY) 50 MG tablet    Sig: Take 1 tablet (50 mg total) by mouth daily.    Dispense:  30 tablet    Refill:  3    Order Specific Question:   Supervising Provider    Answer:   Judyann Munson [[id number]]  . Darunavir-Cobicisctat-Emtricitabine-Tenofovir Alafenamide (SYMTUZA) 800-150-200-10 MG TABS    Sig: Take 1 tablet by mouth daily with breakfast.    Dispense:  30 tablet    Refill:  3    Order Specific Question:   Supervising Provider    Answer:   Judyann Munson [[id number]]  . gabapentin (NEURONTIN) 600 MG tablet    Sig: Take 2 tablets (1,200 mg total) by mouth 3 (three) times daily.    Dispense:  180 tablet    Refill:  1    Order Specific Question:   Supervising Provider    Answer:   Judyann Munson [[id number]]  . albuterol (PROVENTIL HFA;VENTOLIN HFA) 108 (90 Base) MCG/ACT inhaler    Sig: Inhale 2 puffs into the lungs every 6 (six) hours as needed for wheezing or shortness of breath.    Dispense:  1  Inhaler    Refill:  2    Order Specific Question:   Supervising Provider    Answer:   Judyann Munson [[id number]]     Follow-up: Return in about 3  months (around 07/05/2018), or if symptoms worsen or fail to improve.   Marcos EkeGreg Moraima Burd, MSN, FNP-C Nurse Practitioner [facility] Office phone: [phone] Pager: [phone] [facility] Main number: [phone]

## 2018-04-09 ENCOUNTER — Other Ambulatory Visit: Payer: Self-pay | Admitting: Family

## 2018-04-09 DIAGNOSIS — K2100 Gastro-esophageal reflux disease with esophagitis, without bleeding: Secondary | ICD-10-CM

## 2018-05-17 ENCOUNTER — Telehealth: Payer: Self-pay | Admitting: *Deleted

## 2018-05-17 NOTE — Telephone Encounter (Signed)
Patient is calling reporting flu-like symptoms for 2 days.  He reports body aches, productive cough with clear -> yellow phlegm, neck hurts, subjective fever, runny stools. He does also report difficulty breathing, feels short of breath. RN advised patient to go to urgent care for evaluation, but he declined, stating "I just need some antibiotics called in" and he didn't feel like going to the emergency room. He did state that he would go if he felt worse.  Patient would like something called in to [facility] at [address] in [place], [place]. Andree Coss, RN

## 2018-05-17 NOTE — Telephone Encounter (Signed)
Antibiotics at this stage would not be appropriate as it would be recommended he be screened for flu and certainly this could be a viral infection at this point. Recommend continued over the counter medications as needed for symptom relief or he can make an acute appointment if available or see Urgent Care. Thanks!

## 2018-05-18 ENCOUNTER — Telehealth: Payer: Self-pay

## 2018-05-18 NOTE — Telephone Encounter (Signed)
Left message for patient with Greg's advice. Andree Coss, RN

## 2018-05-18 NOTE — Telephone Encounter (Signed)
Please contact patient and see if there is another medication on his formulary, otherwise may need a PA. Thanks.

## 2018-05-18 NOTE — Telephone Encounter (Signed)
Received fax from [facility] stating patient has received temporary supply for Proventil AER HFA. Fax states that medication is not on formulary and patient must switch to alternative medication or PA must be submitted. Will route message to Marcos Eke, Np to advise if PA should be attempted by our office or PCP. Lorenso Courier, [place]

## 2018-05-20 ENCOUNTER — Emergency Department (HOSPITAL_BASED_OUTPATIENT_CLINIC_OR_DEPARTMENT_OTHER)
Admission: EM | Admit: 2018-05-20 | Discharge: 2018-05-20 | Disposition: A | Discharge status: Home / Self Care | Payer: Medicare Other | Attending: Emergency Medicine | Admitting: Emergency Medicine

## 2018-05-20 ENCOUNTER — Other Ambulatory Visit: Payer: Self-pay

## 2018-05-20 ENCOUNTER — Emergency Department (HOSPITAL_BASED_OUTPATIENT_CLINIC_OR_DEPARTMENT_OTHER): Payer: Medicare Other

## 2018-05-20 ENCOUNTER — Encounter (HOSPITAL_BASED_OUTPATIENT_CLINIC_OR_DEPARTMENT_OTHER): Payer: Self-pay | Admitting: Emergency Medicine

## 2018-05-20 DIAGNOSIS — J45901 Unspecified asthma with (acute) exacerbation: Secondary | ICD-10-CM | POA: Diagnosis not present

## 2018-05-20 DIAGNOSIS — Z87891 Personal history of nicotine dependence: Secondary | ICD-10-CM | POA: Insufficient documentation

## 2018-05-20 DIAGNOSIS — Z96643 Presence of artificial hip joint, bilateral: Secondary | ICD-10-CM | POA: Diagnosis not present

## 2018-05-20 DIAGNOSIS — I10 Essential (primary) hypertension: Secondary | ICD-10-CM | POA: Diagnosis not present

## 2018-05-20 DIAGNOSIS — R05 Cough: Secondary | ICD-10-CM | POA: Diagnosis not present

## 2018-05-20 DIAGNOSIS — R9431 Abnormal electrocardiogram [ECG] [EKG]: Secondary | ICD-10-CM | POA: Diagnosis not present

## 2018-05-20 DIAGNOSIS — F419 Anxiety disorder, unspecified: Secondary | ICD-10-CM | POA: Insufficient documentation

## 2018-05-20 DIAGNOSIS — B9789 Other viral agents as the cause of diseases classified elsewhere: Secondary | ICD-10-CM

## 2018-05-20 DIAGNOSIS — J069 Acute upper respiratory infection, unspecified: Secondary | ICD-10-CM | POA: Insufficient documentation

## 2018-05-20 DIAGNOSIS — F329 Major depressive disorder, single episode, unspecified: Secondary | ICD-10-CM | POA: Insufficient documentation

## 2018-05-20 DIAGNOSIS — Z21 Asymptomatic human immunodeficiency virus [HIV] infection status: Secondary | ICD-10-CM | POA: Diagnosis not present

## 2018-05-20 DIAGNOSIS — Z79899 Other long term (current) drug therapy: Secondary | ICD-10-CM | POA: Insufficient documentation

## 2018-05-20 LAB — CBC WITH DIFFERENTIAL/PLATELET
Abs Immature Granulocytes: 0.04 10*3/uL (ref 0.00–0.07)
Basophils Absolute: 0.1 10*3/uL (ref 0.0–0.1)
Basophils Relative: 1 %
EOS ABS: 0.3 10*3/uL (ref 0.0–0.5)
Eosinophils Relative: 3 %
HCT: 38.1 % — ABNORMAL LOW (ref 39.0–52.0)
Hemoglobin: 12.4 g/dL — ABNORMAL LOW (ref 13.0–17.0)
Immature Granulocytes: 0 %
Lymphocytes Relative: 21 %
Lymphs Abs: 2.1 10*3/uL (ref 0.7–4.0)
MCH: 27.3 pg (ref 26.0–34.0)
MCHC: 32.5 g/dL (ref 30.0–36.0)
MCV: 83.7 fL (ref 80.0–100.0)
Monocytes Absolute: 1.2 10*3/uL — ABNORMAL HIGH (ref 0.1–1.0)
Monocytes Relative: 12 %
Neutro Abs: 6.3 10*3/uL (ref 1.7–7.7)
Neutrophils Relative %: 63 %
Platelets: 212 10*3/uL (ref 150–400)
RBC: 4.55 MIL/uL (ref 4.22–5.81)
RDW: 14.1 % (ref 11.5–15.5)
WBC: 10 10*3/uL (ref 4.0–10.5)
nRBC: 0 % (ref 0.0–0.2)

## 2018-05-20 LAB — BASIC METABOLIC PANEL
Anion gap: 11 (ref 5–15)
BUN: 25 mg/dL — ABNORMAL HIGH (ref 6–20)
CALCIUM: 8.9 mg/dL (ref 8.9–10.3)
CO2: 21 mmol/L — ABNORMAL LOW (ref 22–32)
CREATININE: 1.54 mg/dL — AB (ref 0.61–1.24)
Chloride: 104 mmol/L (ref 98–111)
GFR calc Af Amer: >60 mL/min (ref >60)
GFR calc non Af Amer: 52 mL/min — ABNORMAL LOW (ref >60)
Glucose, Bld: 89 mg/dL (ref 70–99)
Potassium: 3.7 mmol/L (ref 3.5–5.1)
Sodium: 136 mmol/L (ref 135–145)

## 2018-05-20 MED ORDER — BENZONATATE 100 MG PO CAPS: 100.0000 mg | ORAL_CAPSULE | Freq: Three times a day (TID) | ORAL | 0 refills | Status: DC | PRN

## 2018-05-20 MED ORDER — PREDNISONE 20 MG PO TABS
40.0000 mg | ORAL_TABLET | Freq: Once | ORAL | Status: AC
  Administered 2018-05-20: 40 mg via ORAL
  Filled 2018-05-20: qty 2

## 2018-05-20 MED ORDER — PREDNISONE 10 MG PO TABS: 40.0000 mg | ORAL_TABLET | Freq: Every day | ORAL | 0 refills | Status: AC

## 2018-05-20 MED ORDER — IPRATROPIUM-ALBUTEROL 0.5-2.5 (3) MG/3ML IN SOLN
3.0000 mL | Freq: Once | RESPIRATORY_TRACT | Status: AC
  Administered 2018-05-20: 3 mL via RESPIRATORY_TRACT
  Filled 2018-05-20: qty 3

## 2018-05-20 NOTE — ED Provider Notes (Signed)
[facility] EMERGENCY DEPARTMENT Provider Note   CSN: [id number] Arrival date & time: 05/20/18  1810    History   Chief Complaint Chief Complaint  Patient presents with  . Cough    HPI [name] is a 53 y.o. male b with past medical history of HIV, GERD, asthma, presenting to the emergency department with complaint of active cough since Monday.  Patient states he has had progressively worsening of symptoms and now has chest tightness.  This feels very similar to his previous history of pneumonia which she had last year.  He states his cough is productive of a clear/white sputum.  He has associated runny nose.  He took Alka-Seltzer cold and flu for his symptoms.  No fevers.  No chest pain.  No cardiac history.  States he called his infectious disease provider on Wednesday and they recommended no antibiotics until he was evaluated by urgent care.  States last time he had the pneumonia he waited too long to be evaluated and needed admission.  He states he thinks he came in early enough this time to catch it.  Regarding his previous history of PE, states this feels very different, PE came from a DVT after hip replacement surgery. Per chart review, CD4 count in December 2019 is 200.  Viral load is less than 20 detected.  States he is very compliant with his antiretrovirals, has not missed any doses.     The history is provided by the patient and medical records.    Past Medical History:  Diagnosis Date  . Anxiety   . Arthritis   . Asthma   . Depression   . History of Clostridium difficile colitis 04/22/2017  . History of pulmonary embolus (PE) 03/17/2013   Overview:  post hip surgery, on blood thinners at the time  . HIV (human immunodeficiency virus infection) (HCC)   . Hypokalemia   . Hypotension 01/25/2015  . Immune deficiency disorder (HCC)   . Infectious folliculitis 01/30/2017  . MRSA (methicillin resistant staph aureus) culture positive   . Sepsis (HCC)   .  Streptococcal pneumonia [facility])     Patient Active Problem List   Diagnosis Date Noted  . 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 S/P hip replacement 02/09/2018  . Essential hypertension   . Lung abscess (HCC) 06/21/2017  . Lung nodule, multiple 06/14/2017  . Asthma 04/22/2017  . Folliculitis 01/30/2017  . Complication of implanted device 07/24/2015  . 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 GERD (gastroesophageal reflux disease) 01/25/2015  . HIV (human immunodeficiency virus infection) (HCC) 01/24/2015  . Primary osteoarthritis of both hips 03/29/2013  . 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 TOTAL HIP ARTHROPLASTY Right 02/09/2018   Procedure: TOTAL HIP ARTHROPLASTY ANTERIOR APPROACH;  Surgeon: Durene Romans, MD;  Location: [facility];  Service: Orthopedics;  Laterality: Right;  70 mins        Home Medications    Prior to Admission medications   Medication Sig Start Date End Date Taking? Authorizing Provider  acetaminophen (TYLENOL) 500 MG tablet Take 2 tablets (1,000 mg total) by mouth every 8 (eight) hours. 02/10/18   Lanney Gins, PA-C  albuterol (PROVENTIL HFA;VENTOLIN HFA) 108 (90 Base) MCG/ACT inhaler Inhale  2 puffs into the lungs every 6 (six) hours as needed for wheezing or shortness of breath. 04/05/18   Veryl Speak, FNP  benzonatate (TESSALON) 100 MG capsule Take 1 capsule (100 mg total) by mouth 3 (three) times daily as needed for cough. 05/20/18   Zell Doucette, [place], PA-C  Darunavir-Cobicisctat-Emtricitabine-Tenofovir Alafenamide [facility]) 800-150-200-10 MG TABS Take 1 tablet by mouth daily with breakfast. 04/05/18   Veryl Speak, FNP  docusate sodium (COLACE) 100 MG capsule Take 1 capsule  (100 mg total) by mouth 2 (two) times daily. 02/09/18   Lanney Gins, PA-C  dolutegravir (TIVICAY) 50 MG tablet Take 1 tablet (50 mg total) by mouth daily. 04/05/18   Veryl Speak, FNP  esomeprazole (NEXIUM) 40 MG capsule TAKE 1 CAPSULE BY MOUTH EVERY DAY AT NOON 04/12/18   Veryl Speak, FNP  ferrous sulfate (FERROUSUL) 325 (65 FE) MG tablet Take 1 tablet (325 mg total) by mouth 3 (three) times daily with meals. 02/09/18   Lanney Gins, PA-C  gabapentin (NEURONTIN) 600 MG tablet Take 2 tablets (1,200 mg total) by mouth 3 (three) times daily. 04/05/18   Veryl Speak, FNP  methocarbamol (ROBAXIN) 500 MG tablet Take 1 tablet (500 mg total) by mouth every 6 (six) hours as needed for muscle spasms. 02/10/18   Lanney Gins, PA-C  Multiple Vitamin (MULTIVITAMIN WITH MINERALS) TABS tablet Take 1 tablet by mouth 2 (two) times daily. Centrum     [provider]  Multiple Vitamins-Minerals (MENS MULTIVITAMIN PLUS PO) Take 1 tablet by mouth 2 (two) times daily.    [provider]  polyethylene glycol (MIRALAX / GLYCOLAX) packet Take 17 g by mouth 2 (two) times daily. 02/09/18   Lanney Gins, PA-C  predniSONE (DELTASONE) 10 MG tablet Take 4 tablets (40 mg total) by mouth daily for 3 days. 05/20/18 05/23/18  Genella Bas, [place], PA-C  Prenatal Vit-Fe Fumarate-FA (PRENATAL MULTIVITAMIN) TABS tablet Take 1 tablet by mouth 2 (two) times daily.     [provider]  valACYclovir (VALTREX) 500 MG tablet Take 1 tablet (500 mg total) by mouth daily. 05/04/17   Randall Hiss, MD    Family History Family History  Problem Relation Age of Onset  . Cancer Mother   . CAD Brother     Social History Social History   Tobacco Use  . Smoking status: Former Smoker    Types: Cigarettes  . Smokeless tobacco: Current User    Types: Chew  Substance Use Topics  . Alcohol use: Yes    Frequency: Never    Comment: seldom  . Drug use: No     Allergies   Hydrocodone;  Sulfa antibiotics; and Tramadol   Review of Systems Review of Systems  HENT: Positive for rhinorrhea.   Respiratory: Positive for cough and chest tightness.   Allergic/Immunologic: Positive for immunocompromised state.  All other systems reviewed and are negative.    Physical Exam Updated Vital Signs BP (!) 142/92   Pulse 82   Temp 97.8 F (36.6 C) (Oral)   Resp 17   Ht 6\' 2"  (1.88 m)   Wt 99.8 kg   SpO2 99%   BMI 28.25 kg/m   Physical Exam Vitals signs and nursing note reviewed.  Constitutional:      General: He is not in acute distress.    Appearance: He is well-developed. He is not ill-appearing.  HENT:     Head: Normocephalic and atraumatic.     Right Ear: Tympanic membrane  and ear canal normal.     Left Ear: Tympanic membrane and ear canal normal.     Mouth/Throat:     Mouth: Mucous membranes are moist.     Pharynx: Oropharynx is clear.  Eyes:     Conjunctiva/sclera: Conjunctivae normal.  Neck:     Musculoskeletal: Normal range of motion and neck supple. No neck rigidity.  Cardiovascular:     Rate and Rhythm: Normal rate and regular rhythm.  Pulmonary:     Effort: Pulmonary effort is normal. No respiratory distress.     Breath sounds: Wheezing (Faint end expiratory wheezes bilateral bases) present.  Abdominal:     Palpations: Abdomen is soft.  Lymphadenopathy:     Cervical: No cervical adenopathy.  Skin:    General: Skin is warm.  Neurological:     Mental Status: He is alert.  Psychiatric:        Behavior: Behavior normal.      ED Treatments / Results  Labs (all labs ordered are listed, but only abnormal results are displayed) Labs Reviewed  BASIC METABOLIC PANEL - Abnormal; Notable for the following components:      Result Value   CO2 21 (*)    BUN 25 (*)    Creatinine, Ser 1.54 (*)    GFR calc non Af Amer 52 (*)    All other components within normal limits  CBC WITH DIFFERENTIAL/PLATELET - Abnormal; Notable for the following components:    Hemoglobin 12.4 (*)    HCT 38.1 (*)    Monocytes Absolute 1.2 (*)    All other components within normal limits  T-HELPER CELLS (CD4) COUNT (NOT AT [facility])  HIV-1 RNA QUANT-NO REFLEX-BLD    EKG EKG Interpretation  Date/Time:  Thursday May 20 2018 18:16:54 EST Ventricular Rate:  76 PR Interval:    QRS Duration: 93 QT Interval:  403 QTC Calculation: 454 R Axis:   60 Text Interpretation:  Sinus rhythm Abnormal R-wave progression, early transition Confirmed by Virgina Norfolk [phone]) on 05/20/2018 7:04:28 PM   Radiology Dg Chest 2 View  Result Date: 05/20/2018 CLINICAL DATA:  Cough and cold type symptoms x 1 week, feels like it's settling in chest, previous h/o PNA, asthma, HIV, MRSA. EXAM: CHEST - 2 VIEW COMPARISON:  11/08/2017 FINDINGS: Cardiac silhouette is normal in size. No mediastinal or hilar masses or evidence of adenopathy. Linear opacities at the left lung base stable from prior exam consistent with scarring. Lungs are otherwise clear. No pleural effusion or pneumothorax. Skeletal structures are intact. IMPRESSION: No active cardiopulmonary disease. Electronically Signed   By: Amie Portland M.D.   On: 05/20/2018 19:17    Procedures Procedures (including critical care time)  Medications Ordered in ED Medications  ipratropium-albuterol (DUONEB) 0.5-2.5 (3) MG/3ML nebulizer solution 3 mL (3 mLs Nebulization Given 05/20/18 1844)  predniSONE (DELTASONE) tablet 40 mg (40 mg Oral Given 05/20/18 2004)     Initial Impression / Assessment and Plan / ED Course  I have reviewed the triage vital signs and the nursing notes.  Pertinent labs & imaging results that were available during my care of the patient were reviewed by me and considered in my medical decision making (see chart for details).  Clinical Course as of May 19 2032  Thu May 20, 2018  1946 Patient discussed with Dr. Lockie Mola.  No leukocytosis.  Chest x-ray negative for acute infiltrate.  Patient reevaluated after breathing  treatment and states he feels much better and is breathing at his baseline.  Lung  exam improved.  Suspect reactive airway disease, likely viral etiology.  Will discharge with short course of prednisone and encourage albuterol.  Close follow-up with PCP/ID provider.  Patient agreeable to plan today for discharge.   [JR]    Clinical Course User Index [JR] Detra Bores, [place], PA-C       Pt with PMHx asthma, HIV, presenting to the ED with 5 days of cough and chest tightness. No fever. Cough is productive of a clear sputum. Pt compliant with HIV medications and is followed by ID. Per last office visit ID provider note, pt has "well-controlled" HIV. On exam today, pt is well-appearing. No respiratory distress.  Afebrile with normal vital signs.  O2 saturation 100% on room air.  Lungs with faint bilateral expiratory wheezes in the bases.  ENT exam reassuring.  Labs and chest x-ray obtained.  CD4 count and viral load sent for ID follow-up.  DuoNeb provided for tightness and wheezing.  Chest x-ray is negative for acute infiltrate.  Labs are reassuring, no leukocytosis.  Mild elevation in creatinine.  Patient with significant improvement after DuoNeb.  Lung exam improved.  States he is currently breathing at his baseline.  Discussed with Dr. Lockie Mola.  At this time do not believe antibiotics are indicated.  Suspect reactive airway disease secondary to viral URI.  Treat as asthma exacerbation with short course of prednisone.  Instructed albuterol every 4 hours as needed.  Close outpatient follow-up.  Patient agreeable to plan and seems reliable for follow-up.  For discharge at this time.  Discussed results, findings, treatment and follow up. Patient advised of return precautions. Patient verbalized understanding and agreed with plan.  Final Clinical Impressions(s) / ED Diagnoses   Final diagnoses:  Viral URI with cough  Exacerbation of asthma, unspecified asthma severity, unspecified whether persistent     ED Discharge Orders         Ordered    predniSONE (DELTASONE) 10 MG tablet  Daily     05/20/18 1950    benzonatate (TESSALON) 100 MG capsule  3 times daily PRN     05/20/18 1950           Airen Dales, [place], PA-C 05/20/18 2034    Virgina Norfolk, DO 05/20/18 2126

## 2018-05-20 NOTE — ED Triage Notes (Signed)
Heavy pain across bil chest since this morning.  Constant. No radiation. Dennies nausea.  Is SOB.

## 2018-05-20 NOTE — ED Notes (Signed)
Patient transported to X-ray 

## 2018-05-20 NOTE — Discharge Instructions (Addendum)
Please read instructions below. Use your albuterol inhaler every 4-6 hours as needed for shortness of breath or wheezing.  Starting tomorrow, begin taking the prednisone, as prescribed, until it is gone. You can take Tessalon every 8 hours as needed for cough. Follow up with your primary care provider/ID provider within the next few days. Return to the ER if you have high fever, worsening productive cough, shortness of breath not improved by your inhaler, or new or concerning symptoms.

## 2018-05-21 LAB — HIV-1 RNA QUANT-NO REFLEX-BLD
HIV 1 RNA Quant: 50 copies/mL
LOG10 HIV-1 RNA: 1.699 {Log_copies}/mL

## 2018-05-21 LAB — T-HELPER CELLS (CD4) COUNT (NOT AT ARMC)
CD4 % Helper T Cell: 12 % — ABNORMAL LOW (ref 33–55)
CD4 T Cell Abs: 240 /uL — ABNORMAL LOW (ref 400–2700)

## 2018-06-10 ENCOUNTER — Other Ambulatory Visit: Payer: Self-pay | Admitting: Family

## 2018-06-10 DIAGNOSIS — B20 Human immunodeficiency virus [HIV] disease: Secondary | ICD-10-CM

## 2018-06-14 ENCOUNTER — Telehealth: Payer: Self-pay

## 2018-06-14 DIAGNOSIS — R12 Heartburn: Secondary | ICD-10-CM

## 2018-06-14 MED ORDER — OMEPRAZOLE 20 MG PO CPDR: 20.0000 mg | DELAYED_RELEASE_CAPSULE | Freq: Every day | ORAL | 2 refills | Status: DC

## 2018-06-14 NOTE — Telephone Encounter (Signed)
Patient called request Prilosec. Routing to Pharmacy for advise.  Valarie Cones, LPN

## 2018-06-15 NOTE — Telephone Encounter (Signed)
No interactions, will send Rx to his pharmacy.

## 2018-06-16 ENCOUNTER — Other Ambulatory Visit: Payer: Self-pay | Admitting: *Deleted

## 2018-06-16 ENCOUNTER — Telehealth: Payer: Self-pay

## 2018-06-16 NOTE — Telephone Encounter (Signed)
Attempted to initiate PA for patient's Proventil HFA inhaler. Spoke with plan representative who states that Proventil is not a preferred medication and that patient would have to pay 25-33% of medication cost. Preferred alternative is Ventolin. Will route message to Marcos Eke, Np to advise if office should continue with proventil PA or if alternative medication should be sent to the pharmacy.  Lorenso Courier, [place]

## 2018-06-17 ENCOUNTER — Emergency Department (HOSPITAL_BASED_OUTPATIENT_CLINIC_OR_DEPARTMENT_OTHER): Payer: Medicare Other

## 2018-06-17 ENCOUNTER — Emergency Department (HOSPITAL_BASED_OUTPATIENT_CLINIC_OR_DEPARTMENT_OTHER)
Admission: EM | Admit: 2018-06-17 | Discharge: 2018-06-17 | Disposition: A | Discharge status: Home / Self Care | Payer: Medicare Other | Attending: Emergency Medicine | Admitting: Emergency Medicine

## 2018-06-17 ENCOUNTER — Encounter (HOSPITAL_BASED_OUTPATIENT_CLINIC_OR_DEPARTMENT_OTHER): Payer: Self-pay | Admitting: *Deleted

## 2018-06-17 ENCOUNTER — Other Ambulatory Visit: Payer: Self-pay

## 2018-06-17 DIAGNOSIS — Z96641 Presence of right artificial hip joint: Secondary | ICD-10-CM | POA: Diagnosis not present

## 2018-06-17 DIAGNOSIS — M795 Residual foreign body in soft tissue: Secondary | ICD-10-CM | POA: Insufficient documentation

## 2018-06-17 DIAGNOSIS — Z96642 Presence of left artificial hip joint: Secondary | ICD-10-CM | POA: Diagnosis not present

## 2018-06-17 DIAGNOSIS — F17220 Nicotine dependence, chewing tobacco, uncomplicated: Secondary | ICD-10-CM | POA: Diagnosis not present

## 2018-06-17 DIAGNOSIS — M799 Soft tissue disorder, unspecified: Secondary | ICD-10-CM | POA: Insufficient documentation

## 2018-06-17 DIAGNOSIS — M7989 Other specified soft tissue disorders: Secondary | ICD-10-CM

## 2018-06-17 DIAGNOSIS — R2232 Localized swelling, mass and lump, left upper limb: Secondary | ICD-10-CM | POA: Diagnosis not present

## 2018-06-17 DIAGNOSIS — J45909 Unspecified asthma, uncomplicated: Secondary | ICD-10-CM | POA: Insufficient documentation

## 2018-06-17 DIAGNOSIS — B20 Human immunodeficiency virus [HIV] disease: Secondary | ICD-10-CM | POA: Diagnosis not present

## 2018-06-17 MED ORDER — CLINDAMYCIN HCL 300 MG PO CAPS: 300.0000 mg | ORAL_CAPSULE | Freq: Four times a day (QID) | ORAL | 0 refills | Status: AC

## 2018-06-17 NOTE — ED Triage Notes (Signed)
Possible splinter in his left hand for a week.

## 2018-06-17 NOTE — ED Notes (Signed)
Splinter in palm of left hand 7 days ago  Increased swelling and pain

## 2018-06-17 NOTE — ED Provider Notes (Addendum)
[facility] Emergency Department Provider Note MRN:  [id number]  Arrival date & time: 06/17/18     Chief Complaint   Foreign Body   History of Present Illness   [name] is a 53 y.o. year-old male with a history of HIV presenting to the ED with chief complaint of hand pain.  Patient explains that almost a week ago he was moving a large piece of wood into a flat bed of a truck.  His hand slid against the outside wood and he sustained a splinter to his left hand.  Thinks there is still some wood in his hand causing him pain.  The pain is constant, worse with motion or palpation of the left hand at the site of the small puncture wound.  Denies fever, no nausea vomiting, no chest pain or shortness of breath, no abdominal pain.  Noting some mild joint pains for the past few days as well.  Review of Systems  A complete 10 system review of systems was obtained and all systems are negative except as noted in the HPI and PMH.   Patient's Health History    Past Medical History:  Diagnosis Date  . Anxiety   . Arthritis   . Asthma   . Depression   . History of Clostridium difficile colitis 04/22/2017  . History of pulmonary embolus (PE) 03/17/2013   Overview:  post hip surgery, on blood thinners at the time  . HIV (human immunodeficiency virus infection) (HCC)   . Hypokalemia   . Hypotension 01/25/2015  . Immune deficiency disorder (HCC)   . Infectious folliculitis 01/30/2017  . MRSA (methicillin resistant staph aureus) culture positive   . Sepsis (HCC)   . Streptococcal pneumonia [facility])     Past Surgical History:  Procedure Laterality Date  . 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 TOTAL HIP ARTHROPLASTY Right 02/09/2018   Procedure: TOTAL HIP ARTHROPLASTY ANTERIOR APPROACH;  Surgeon: Durene Romans, MD;  Location: [facility];  Service: Orthopedics;  Laterality: Right;  70 mins    Family History   Problem Relation Age of Onset  . Cancer Mother   . CAD Brother     Social History   Socioeconomic History  . Marital status: Legally Separated    Spouse name: Not on file  . Number of children: Not on file  . Years of education: Not on file  . Highest education level: Not on file  Occupational History  . Not on file  Social Needs  . Financial resource strain: Not on file  . Food insecurity:    Worry: Not on file    Inability: Not on file  . Transportation needs:    Medical: Not on file    Non-medical: Not on file  Tobacco Use  . Smoking status: Former Smoker    Types: Cigarettes  . Smokeless tobacco: Current User    Types: Chew  Substance and Sexual Activity  . Alcohol use: Yes    Frequency: Never    Comment: seldom  . Drug use: No  . Sexual activity: Not on file    Comment: declined condoms  Lifestyle  . Physical activity:    Days per week: Not on file    Minutes per session: Not on file  . Stress: Not on file  Relationships  . Social connections:    Talks on phone: Not on file    Gets together: Not on  file    Attends religious service: Not on file    Active member of club or organization: Not on file    Attends meetings of clubs or organizations: Not on file    Relationship status: Not on file  . Intimate partner violence:    Fear of current or ex partner: Not on file    Emotionally abused: Not on file    Physically abused: Not on file    Forced sexual activity: Not on file  Other Topics Concern  . Not on file  Social History Narrative  . Not on file     Physical Exam  Vital Signs and Nursing Notes reviewed Vitals:   06/17/18 1105  BP: 128/83  Pulse: 73  Resp: 14  Temp: 98.1 F (36.7 C)  SpO2: 97%    CONSTITUTIONAL: Well-appearing, NAD NEURO:  Alert and oriented x 3, no focal deficits EYES:  eyes equal and reactive ENT/NECK:  no LAD, no JVD CARDIO: Regular rate, well-perfused, normal S1 and S2 PULM:  CTAB no wheezing or rhonchi GI/GU:   normal bowel sounds, non-distended, non-tender MSK/SPINE:  No gross deformities, no edema SKIN:  no rash, atraumatic; small healed puncture wound to the hyperthenar eminence of the left hand with surrounding tenderness to palpation but no surrounding erythema or induration PSYCH:  Appropriate speech and behavior  Diagnostic and Interventional Summary    EKG Interpretation  Date/Time:    Ventricular Rate:    PR Interval:    QRS Duration:   QT Interval:    QTC Calculation:   R Axis:     Text Interpretation:        Labs Reviewed - No data to display  DG Hand Complete Left  Final Result      Medications - No data to display   Procedures  EMERGENCY DEPARTMENT US SOFT TISSUE INTERPRETATION "Study: Limited Soft Tissue Ultrasound"  INDICATIONS: Pain and evaluation for foreign body Multiple views of the body part were obtained in real-time with a multi-frequency linear probe  PERFORMED BY: Myself IMAGES ARCHIVED?: Yes SIDE:Left BODY PART:Upper extremity INTERPRETATION:  mild soft tissue swelling, no evidence of foreign body    Critical Care  ED Course and Medical Decision Making  I have reviewed the triage vital signs and the nursing notes.  Pertinent labs & imaging results that were available during my care of the patient were reviewed by me and considered in my medical decision making (see below for details).  53 year old male history of HIV here with splinter wound, question of continued retained foreign body.  Explained that he has been self treating at home with doxycycline for the past several days.  Denies fever.  Will x-ray to attempt to locate any foreign body, followed by ultrasound if the x-ray does not find anything.  Would consider removal here if a definitive foreign body is found and it is close to the surface.  Patient is hesitant to undergo any procedure or suture repair, may have to be done as an outpatient with a hand specialist.  Patient is without any  systemic symptoms, no fever, no concern for significant life or limb threatening infection at this time.  Both x-ray and bedside ultrasound did not reveal any evidence of foreign body.  Still foreign body is possible causing some localized inflammation.  Given patient's past medical history with HIV, will provide prescription for clindamycin and advised soaking the hand with warm water frequently to help heal.  After the discussed management above, the patient  was determined to be safe for discharge.  The patient was in agreement with this plan and all questions regarding their care were answered.  ED return precautions were discussed and the patient will return to the ED with any significant worsening of condition.  Elmer Sow. Pilar Plate, MD [facility] Emergency Medicine [facility] [email or web address]  Final Clinical Impressions(s) / ED Diagnoses     ICD-10-CM   1. Soft tissues foreign body M79.5   2. Inflammation of soft tissue M79.9     ED Discharge Orders         Ordered    clindamycin (CLEOCIN) 300 MG capsule  4 times daily     06/17/18 1159             Sabas Sous, MD 06/17/18 1203    Sabas Sous, MD 06/17/18 1204

## 2018-06-17 NOTE — Discharge Instructions (Addendum)
You were evaluated in the Emergency Department and after careful evaluation, we did not find any emergent condition requiring admission or further testing in the hospital.  Your symptoms today seem to be due to inflammation of the hand, possibly due to a piece of wood still under the skin.  Please take the antibiotics provided to prevent infection.  Please soak the hand in warm water or Epson salt frequently throughout the day to help the hand heal.  Use Tylenol or ibuprofen at home for discomfort.  Please return to the Emergency Department if you experience any worsening of your condition.  We encourage you to follow up with a primary care provider.  Thank you for allowing [place] to be a part of your care.

## 2018-06-21 ENCOUNTER — Other Ambulatory Visit: Payer: Medicare Other

## 2018-06-21 NOTE — Telephone Encounter (Signed)
Ok to change to Ventolin with same signature.

## 2018-06-22 ENCOUNTER — Other Ambulatory Visit: Payer: Self-pay

## 2018-06-22 DIAGNOSIS — B20 Human immunodeficiency virus [HIV] disease: Secondary | ICD-10-CM

## 2018-06-22 MED ORDER — ALBUTEROL SULFATE HFA 108 (90 BASE) MCG/ACT IN AERS: 2.0000 | INHALATION_SPRAY | Freq: Four times a day (QID) | RESPIRATORY_TRACT | 2 refills | Status: DC | PRN

## 2018-06-22 NOTE — Telephone Encounter (Signed)
Spoke with Pharmacist at [facility] who was able to take a verbal order for Ventolin. Pharmacist was able to take prescription information over the phone. Same signature was provided. Lorenso Courier, [place]

## 2018-06-22 NOTE — Progress Notes (Signed)
v

## 2018-07-06 ENCOUNTER — Other Ambulatory Visit: Payer: Self-pay

## 2018-07-06 ENCOUNTER — Encounter: Payer: Self-pay | Admitting: Family

## 2018-07-06 ENCOUNTER — Ambulatory Visit (INDEPENDENT_AMBULATORY_CARE_PROVIDER_SITE_OTHER): Payer: Medicare Other | Admitting: Family

## 2018-07-06 DIAGNOSIS — R200 Anesthesia of skin: Secondary | ICD-10-CM | POA: Diagnosis not present

## 2018-07-06 DIAGNOSIS — Z0000 Encounter for general adult medical examination without abnormal findings: Secondary | ICD-10-CM

## 2018-07-06 DIAGNOSIS — B20 Human immunodeficiency virus [HIV] disease: Secondary | ICD-10-CM

## 2018-07-06 DIAGNOSIS — R12 Heartburn: Secondary | ICD-10-CM

## 2018-07-06 MED ORDER — DARUN-COBIC-EMTRICIT-TENOFAF 800-150-200-10 MG PO TABS: 1.0000 | ORAL_TABLET | Freq: Every day | ORAL | 5 refills | Status: DC

## 2018-07-06 MED ORDER — GABAPENTIN 600 MG PO TABS: ORAL_TABLET | ORAL | 1 refills | Status: DC

## 2018-07-06 MED ORDER — DOLUTEGRAVIR SODIUM 50 MG PO TABS: 50.0000 mg | ORAL_TABLET | Freq: Every day | ORAL | 5 refills | Status: DC

## 2018-07-06 MED ORDER — OMEPRAZOLE 20 MG PO CPDR: 20.0000 mg | DELAYED_RELEASE_CAPSULE | Freq: Every day | ORAL | 5 refills | Status: DC

## 2018-07-06 NOTE — Progress Notes (Signed)
Subjective:    Patient ID: [name], male    DOB: Sep 27, 1965, 53 y.o.   MRN: [id number]  Chief Complaint  Patient presents with  . HIV Positive/AIDS     Virtual Visit via Telephone Note   I connected with [name] on 07/06/2018 at 8:45 AM by telephone and verified that I am speaking with the correct person using two identifiers.   I discussed the limitations, risks, security and privacy concerns of performing an evaluation and management service by telephone and the availability of in person appointments. I also discussed with the patient that there may be a patient responsible charge related to this service. The patient expressed understanding and agreed to proceed.   HPI:  [name] is a 53 y.o. male with history of HIV who was last seen in the office on 04/05/2018 with good adherence and tolerance to his ART regimen of Symtuza and Tivicay.  Viral load noted to be undetectable and CD4 count of 200.  He continues to have neuropathy and is status post right hip replacement maintained on gabapentin.  Since his last office visit he has been seen last he has been seen twice at [facility] once for viral URI and second for concern of splinter. Most recent blood work completed on 05/20/2018 with viral load of 50 and CD4 count of 240.  Renal function increased with creatinine of 1.54 and GFR of 52 indicating CKD stage III.  Previous creatinine baseline below 1.  RPR was nonreactive. Health maintenance due includes Menveo, Prevnar, colon cancer screening and dental.   [name] continues to take his Tivicay and Symtuza as prescribed with no adverse side effects or missed doses.  Overall he feels okay.  He does continue to have numbness/tingling located in his right lower extremity that generally waxes and wanes from some sensation to near complete numbness.  He continues to take gabapentin for this which helps on occasion.  Denies fevers, chills, night sweats,  headaches, changes in vision, neck pain/stiffness, nausea, diarrhea, vomiting, lesions or rashes.  [name] remains covered through Medicare and has no problems obtaining his medications from the pharmacy although has experienced some issues with refills.  He is not currently working and remaining in quarantine due to the coronavirus.  Not currently sexually active.  Denies feelings of being down, depressed, or hopeless recently.  No recreational or illicit drug use presently.   Allergies  Allergen Reactions  . Hydrocodone Other (See Comments)    "makes me overly sleepy"  . Sulfa Antibiotics Rash  . Tramadol Nausea Only      Outpatient Medications Prior to Visit  Medication Sig Dispense Refill  . acetaminophen (TYLENOL) 500 MG tablet Take 2 tablets (1,000 mg total) by mouth every 8 (eight) hours. 30 tablet 0  . albuterol (PROVENTIL HFA;VENTOLIN HFA) 108 (90 Base) MCG/ACT inhaler Inhale 2 puffs into the lungs every 6 (six) hours as needed for wheezing or shortness of breath. 1 Inhaler 2  . benzonatate (TESSALON) 100 MG capsule Take 1 capsule (100 mg total) by mouth 3 (three) times daily as needed for cough. 21 capsule 0  . docusate sodium (COLACE) 100 MG capsule Take 1 capsule (100 mg total) by mouth 2 (two) times daily. 10 capsule 0  . ferrous sulfate (FERROUSUL) 325 (65 FE) MG tablet Take 1 tablet (325 mg total) by mouth 3 (three) times daily with meals.  3  . methocarbamol (ROBAXIN) 500 MG tablet Take 1 tablet (500  mg total) by mouth every 6 (six) hours as needed for muscle spasms. 40 tablet 0  . Multiple Vitamin (MULTIVITAMIN WITH MINERALS) TABS tablet Take 1 tablet by mouth 2 (two) times daily. Centrum     . Multiple Vitamins-Minerals (MENS MULTIVITAMIN PLUS PO) Take 1 tablet by mouth 2 (two) times daily.    . polyethylene glycol (MIRALAX / GLYCOLAX) packet Take 17 g by mouth 2 (two) times daily. 14 each 0  . Prenatal Vit-Fe Fumarate-FA (PRENATAL MULTIVITAMIN) TABS tablet Take 1  tablet by mouth 2 (two) times daily.     . valACYclovir (VALTREX) 500 MG tablet Take 1 tablet (500 mg total) by mouth daily. 30 tablet 11  . Darunavir-Cobicisctat-Emtricitabine-Tenofovir Alafenamide (SYMTUZA) 800-150-200-10 MG TABS Take 1 tablet by mouth daily with breakfast. 30 tablet 3  . dolutegravir (TIVICAY) 50 MG tablet Take 1 tablet (50 mg total) by mouth daily. 30 tablet 3  . gabapentin (NEURONTIN) 600 MG tablet TAKE 2 TABLETS(1200 MG) BY MOUTH THREE TIMES DAILY 180 tablet 0  . omeprazole (PRILOSEC) 20 MG capsule Take 1 capsule (20 mg total) by mouth daily. 30 capsule 2   No facility-administered medications prior to visit.      Past Medical History:  Diagnosis Date  . Anxiety   . Arthritis   . Asthma   . Depression   . History of Clostridium difficile colitis 04/22/2017  . History of pulmonary embolus (PE) 03/17/2013   Overview:  post hip surgery, on blood thinners at the time  . HIV (human immunodeficiency virus infection) (HCC)   . Hypokalemia   . Hypotension 01/25/2015  . Immune deficiency disorder (HCC)   . Infectious folliculitis 01/30/2017  . MRSA (methicillin resistant staph aureus) culture positive   . Sepsis (HCC)   . Streptococcal pneumonia [facility])      Past Surgical History:  Procedure Laterality Date  . 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 TOTAL HIP ARTHROPLASTY Right 02/09/2018   Procedure: TOTAL HIP ARTHROPLASTY ANTERIOR APPROACH;  Surgeon: Durene Romans, MD;  Location: [facility];  Service: Orthopedics;  Laterality: Right;  70 mins       Review of Systems  Constitutional: Negative for appetite change, chills, fatigue, fever and unexpected weight change.  Eyes: Negative for visual disturbance.  Respiratory: Negative for cough, chest tightness, shortness of breath and wheezing.   Cardiovascular: Negative for chest pain and leg swelling.  Gastrointestinal: Negative for abdominal pain,  constipation, diarrhea, nausea and vomiting.  Genitourinary: Negative for dysuria, flank pain, frequency, genital sores, hematuria and urgency.  Skin: Negative for rash.  Allergic/Immunologic: Negative for immunocompromised state.  Neurological: Negative for dizziness and headaches.      Objective:    Nursing note and vital signs reviewed.    [name] is pleasant to speak with and sounds to be doing okay.  Phone call awakened him from sleep and was slightly drowsy. Assessment & Plan:   Problem List Items Addressed This Visit      Other   HIV (human immunodeficiency virus infection) (HCC) - Primary    [name] appears to have adequately controlled HIV disease with good adherence and tolerance to his ART regimen of Symtuza and Tivicay.  No symptoms of opportunistic infection or progressive HIV disease at present.  He has no problems obtaining his medication from his pharmacy and will ensure enough refills are present.  Continue current dose of Tivicay and Symtuza.  Plan for follow-up in  3 months or sooner if needed with lab work 1 to 2 weeks prior to appointment.      Relevant Medications   dolutegravir (TIVICAY) 50 MG tablet   Darunavir-Cobicisctat-Emtricitabine-Tenofovir Alafenamide (SYMTUZA) 800-150-200-10 MG TABS   gabapentin (NEURONTIN) 600 MG tablet   Healthcare maintenance     Due for Menveo, Prevnar, colon cancer screening and dental exam.  Will complete [facility] dental referral at next office visit and discussed vaccinations.  Discussed importance of safe sexual practice to reduce risk of acquisition and transmission of STI.      Numbness of right lower extremity    [name] continues to experience numbness/tingling of his right lower extremity ranging from neuropathy to complete numbness on occasion and currently maintained on gabapentin status post right hip arthroplasty.  He does have history of back problems.  Will refer to neurology for further evaluation and  possible nerve conduction study with likely source being his back.      Relevant Orders   Ambulatory referral to Neurology   Heartburn    [name] has well controlled heartburn/GERD with current dose of omeprazole. Recommend GERD related diet. Continue with current dose of omeprazole.       Relevant Medications   omeprazole (PRILOSEC) 20 MG capsule       I am having [name] maintain his valACYclovir, prenatal multivitamin, multivitamin with minerals, Multiple Vitamins-Minerals (MENS MULTIVITAMIN PLUS PO), ferrous sulfate, docusate sodium, polyethylene glycol, methocarbamol, acetaminophen, benzonatate, albuterol, dolutegravir, Darunavir-Cobicisctat-Emtricitabine-Tenofovir Alafenamide, gabapentin, and omeprazole.   Meds ordered this encounter  Medications  . dolutegravir (TIVICAY) 50 MG tablet    Sig: Take 1 tablet (50 mg total) by mouth daily.    Dispense:  30 tablet    Refill:  5    Order Specific Question:   Supervising Provider    Answer:   Judyann MunsonSNIDER, CYNTHIA [[id number]]  . Darunavir-Cobicisctat-Emtricitabine-Tenofovir Alafenamide (SYMTUZA) 800-150-200-10 MG TABS    Sig: Take 1 tablet by mouth daily with breakfast.    Dispense:  30 tablet    Refill:  5    Order Specific Question:   Supervising Provider    Answer:   Judyann MunsonSNIDER, CYNTHIA [[id number]]  . gabapentin (NEURONTIN) 600 MG tablet    Sig: TAKE 2 TABLETS(1200 MG) BY MOUTH THREE TIMES DAILY    Dispense:  180 tablet    Refill:  1    Order Specific Question:   Supervising Provider    Answer:   Judyann MunsonSNIDER, CYNTHIA [[id number]]  . omeprazole (PRILOSEC) 20 MG capsule    Sig: Take 1 capsule (20 mg total) by mouth daily.    Dispense:  30 capsule    Refill:  5    Order Specific Question:   Supervising Provider    Answer:   Judyann MunsonSNIDER, CYNTHIA [phone][[id number]]     I discussed the assessment and treatment plan with the patient. The patient was provided an opportunity to ask questions and all were answered. The patient agreed with the plan and  demonstrated an understanding of the instructions.   The patient was advised to call back or seek an in-person evaluation if the symptoms worsen or if the condition fails to improve as anticipated.   I provided 15   minutes of non-face-to-face time during this encounter.  Follow-up: Return in about 3 months (around 10/05/2018), or if symptoms worsen or fail to improve.   Marcos EkeGreg Bassem Bernasconi, MSN, FNP-C Nurse Practitioner [facility] Main number: [phone]

## 2018-07-06 NOTE — Assessment & Plan Note (Signed)
Mr. Yanagi appears to have adequately controlled HIV disease with good adherence and tolerance to his ART regimen of Symtuza and Tivicay.  No symptoms of opportunistic infection or progressive HIV disease at present.  He has no problems obtaining his medication from his pharmacy and will ensure enough refills are present.  Continue current dose of Tivicay and Symtuza.  Plan for follow-up in 3 months or sooner if needed with lab work 1 to 2 weeks prior to appointment.

## 2018-07-06 NOTE — Patient Instructions (Signed)
Nice to speak with you.  Please continue to take your Tivicay and Symtuza daily as prescribed.  Refills of medication have been sent to the pharmacy.   Referral has been sent to neurology for your right leg to see if further back or nerve conduction study is needed.  Office follow up in 3 months or sooner if needed with lab work 1-2 weeks prior to appointment.   Have a great day!

## 2018-07-06 NOTE — Assessment & Plan Note (Signed)
   Due for Menveo, Prevnar, colon cancer screening and dental exam.  Will complete [facility] dental referral at next office visit and discussed vaccinations.  Discussed importance of safe sexual practice to reduce risk of acquisition and transmission of STI.

## 2018-07-06 NOTE — Assessment & Plan Note (Signed)
[name] continues to experience numbness/tingling of his right lower extremity ranging from neuropathy to complete numbness on occasion and currently maintained on gabapentin status post right hip arthroplasty.  He does have history of back problems.  Will refer to neurology for further evaluation and possible nerve conduction study with likely source being his back.

## 2018-07-06 NOTE — Assessment & Plan Note (Signed)
[name] has well controlled heartburn/GERD with current dose of omeprazole. Recommend GERD related diet. Continue with current dose of omeprazole.

## 2018-08-03 ENCOUNTER — Telehealth: Payer: Self-pay | Admitting: *Deleted

## 2018-08-03 NOTE — Telephone Encounter (Signed)
Patient left message in triage asking for patient assistance. RN returned call, left voicemail asking him to call us back if he still needed assistance. Andree Coss, RN

## 2018-08-05 ENCOUNTER — Encounter: Payer: Self-pay | Admitting: *Deleted

## 2018-08-05 ENCOUNTER — Telehealth: Payer: Self-pay | Admitting: Diagnostic Neuroimaging

## 2018-08-05 ENCOUNTER — Telehealth: Payer: Self-pay | Admitting: *Deleted

## 2018-08-05 NOTE — Telephone Encounter (Signed)
Called patient and updated EMR. 

## 2018-08-05 NOTE — Telephone Encounter (Signed)
Pt gave consent for a video visit with our office. Pt understands that although there may be some limitations with this type of visit, we will take all precautions to reduce any security or privacy concerns.  Pt understands that this will be treated like an in office visit and we will file with pt's insurance, and there may be a patient responsible charge related to this service. °

## 2018-08-10 ENCOUNTER — Other Ambulatory Visit: Payer: Self-pay

## 2018-08-10 ENCOUNTER — Ambulatory Visit: Payer: Medicare Other | Admitting: Diagnostic Neuroimaging

## 2018-08-10 NOTE — Telephone Encounter (Signed)
LVM requesting he call back and reschedule virtual visit as he did not connect this morning. I advised phone staff may reschedule, and I can speak with him if he prefers. Left office number.

## 2018-08-14 ENCOUNTER — Other Ambulatory Visit: Payer: Self-pay | Admitting: Infectious Disease

## 2018-08-22 ENCOUNTER — Other Ambulatory Visit: Payer: Self-pay | Admitting: Family

## 2018-08-22 DIAGNOSIS — B20 Human immunodeficiency virus [HIV] disease: Secondary | ICD-10-CM

## 2018-08-25 ENCOUNTER — Other Ambulatory Visit: Payer: Self-pay | Admitting: Infectious Disease

## 2018-08-25 ENCOUNTER — Other Ambulatory Visit (INDEPENDENT_AMBULATORY_CARE_PROVIDER_SITE_OTHER): Payer: Self-pay | Admitting: Physician Assistant

## 2018-08-25 DIAGNOSIS — B20 Human immunodeficiency virus [HIV] disease: Secondary | ICD-10-CM

## 2018-08-29 ENCOUNTER — Other Ambulatory Visit: Payer: Self-pay | Admitting: Family

## 2018-08-29 DIAGNOSIS — B20 Human immunodeficiency virus [HIV] disease: Secondary | ICD-10-CM

## 2018-08-31 ENCOUNTER — Emergency Department (HOSPITAL_BASED_OUTPATIENT_CLINIC_OR_DEPARTMENT_OTHER): Payer: Medicare Other

## 2018-08-31 ENCOUNTER — Emergency Department (HOSPITAL_BASED_OUTPATIENT_CLINIC_OR_DEPARTMENT_OTHER)
Admission: EM | Admit: 2018-08-31 | Discharge: 2018-08-31 | Disposition: A | Discharge status: Home / Self Care | Payer: Medicare Other | Attending: Emergency Medicine | Admitting: Emergency Medicine

## 2018-08-31 ENCOUNTER — Encounter (HOSPITAL_BASED_OUTPATIENT_CLINIC_OR_DEPARTMENT_OTHER): Payer: Self-pay | Admitting: *Deleted

## 2018-08-31 ENCOUNTER — Other Ambulatory Visit: Payer: Self-pay

## 2018-08-31 DIAGNOSIS — J9811 Atelectasis: Secondary | ICD-10-CM | POA: Diagnosis not present

## 2018-08-31 DIAGNOSIS — Z87891 Personal history of nicotine dependence: Secondary | ICD-10-CM | POA: Insufficient documentation

## 2018-08-31 DIAGNOSIS — R197 Diarrhea, unspecified: Secondary | ICD-10-CM | POA: Insufficient documentation

## 2018-08-31 DIAGNOSIS — Z20828 Contact with and (suspected) exposure to other viral communicable diseases: Secondary | ICD-10-CM | POA: Insufficient documentation

## 2018-08-31 DIAGNOSIS — Z21 Asymptomatic human immunodeficiency virus [HIV] infection status: Secondary | ICD-10-CM | POA: Insufficient documentation

## 2018-08-31 DIAGNOSIS — J45909 Unspecified asthma, uncomplicated: Secondary | ICD-10-CM | POA: Insufficient documentation

## 2018-08-31 DIAGNOSIS — R05 Cough: Secondary | ICD-10-CM | POA: Insufficient documentation

## 2018-08-31 DIAGNOSIS — Z79899 Other long term (current) drug therapy: Secondary | ICD-10-CM | POA: Diagnosis not present

## 2018-08-31 DIAGNOSIS — R059 Cough, unspecified: Secondary | ICD-10-CM

## 2018-08-31 DIAGNOSIS — R109 Unspecified abdominal pain: Secondary | ICD-10-CM | POA: Diagnosis present

## 2018-08-31 LAB — COMPREHENSIVE METABOLIC PANEL
ALT: 25 U/L (ref 0–44)
AST: 26 U/L (ref 15–41)
Albumin: 4.1 g/dL (ref 3.5–5.0)
Alkaline Phosphatase: 75 U/L (ref 38–126)
Anion gap: 9 (ref 5–15)
BUN: 13 mg/dL (ref 6–20)
CO2: 21 mmol/L — ABNORMAL LOW (ref 22–32)
Calcium: 8.7 mg/dL — ABNORMAL LOW (ref 8.9–10.3)
Chloride: 109 mmol/L (ref 98–111)
Creatinine, Ser: 1.25 mg/dL — ABNORMAL HIGH (ref 0.61–1.24)
GFR calc Af Amer: >60 mL/min (ref >60)
GFR calc non Af Amer: >60 mL/min (ref >60)
Glucose, Bld: 95 mg/dL (ref 70–99)
Potassium: 3.9 mmol/L (ref 3.5–5.1)
Sodium: 139 mmol/L (ref 135–145)
Total Bilirubin: 0.6 mg/dL (ref 0.3–1.2)
Total Protein: 7.9 g/dL (ref 6.5–8.1)

## 2018-08-31 LAB — CBC WITH DIFFERENTIAL/PLATELET
Abs Immature Granulocytes: 0.03 10*3/uL (ref 0.00–0.07)
Basophils Absolute: 0 10*3/uL (ref 0.0–0.1)
Basophils Relative: 1 %
Eosinophils Absolute: 0.3 10*3/uL (ref 0.0–0.5)
Eosinophils Relative: 4 %
HCT: 43.6 % (ref 39.0–52.0)
Hemoglobin: 14.1 g/dL (ref 13.0–17.0)
Immature Granulocytes: 1 %
Lymphocytes Relative: 19 %
Lymphs Abs: 1.2 10*3/uL (ref 0.7–4.0)
MCH: 28 pg (ref 26.0–34.0)
MCHC: 32.3 g/dL (ref 30.0–36.0)
MCV: 86.5 fL (ref 80.0–100.0)
Monocytes Absolute: 0.8 10*3/uL (ref 0.1–1.0)
Monocytes Relative: 13 %
Neutro Abs: 4.1 10*3/uL (ref 1.7–7.7)
Neutrophils Relative %: 62 %
Platelets: 144 10*3/uL — ABNORMAL LOW (ref 150–400)
RBC: 5.04 MIL/uL (ref 4.22–5.81)
RDW: 15.9 % — ABNORMAL HIGH (ref 11.5–15.5)
WBC: 6.5 10*3/uL (ref 4.0–10.5)
nRBC: 0 % (ref 0.0–0.2)

## 2018-08-31 MED ORDER — LOPERAMIDE HCL 2 MG PO CAPS
4.0000 mg | ORAL_CAPSULE | Freq: Once | ORAL | Status: AC
  Administered 2018-08-31: 4 mg via ORAL
  Filled 2018-08-31: qty 2

## 2018-08-31 MED ORDER — MELOXICAM 15 MG PO TABS: 15.0000 mg | ORAL_TABLET | Freq: Every day | ORAL | 0 refills | Status: DC

## 2018-08-31 MED ORDER — KETOROLAC TROMETHAMINE 15 MG/ML IJ SOLN
15.0000 mg | Freq: Once | INTRAMUSCULAR | Status: AC
  Administered 2018-08-31: 15 mg via INTRAVENOUS
  Filled 2018-08-31: qty 1

## 2018-08-31 MED ORDER — HYDROMORPHONE HCL 1 MG/ML IJ SOLN
0.5000 mg | Freq: Once | INTRAMUSCULAR | Status: AC
  Administered 2018-08-31: 0.5 mg via INTRAVENOUS
  Filled 2018-08-31: qty 1

## 2018-08-31 MED ORDER — SODIUM CHLORIDE 0.9 % IV BOLUS
1000.0000 mL | Freq: Once | INTRAVENOUS | Status: AC
  Administered 2018-08-31: 1000 mL via INTRAVENOUS

## 2018-08-31 NOTE — ED Triage Notes (Signed)
Abdominal pain and diarrhea x 3 days. Cough, headache, chills, body aches yesterday.

## 2018-08-31 NOTE — ED Notes (Signed)
Pt aware we need stool sample, states he unable to go at this time.

## 2018-08-31 NOTE — Discharge Instructions (Addendum)
Take imodium 2 mg every time you have diarrhea up to 5 times per day.

## 2018-09-01 LAB — NOVEL CORONAVIRUS, NAA (HOSP ORDER, SEND-OUT TO REF LAB; TAT 18-24 HRS): SARS-CoV-2, NAA: NOT DETECTED

## 2018-09-01 NOTE — ED Provider Notes (Signed)
[facility] EMERGENCY DEPARTMENT Provider Note   CSN: [id number] Arrival date & time: 08/31/18  1339     History   Chief Complaint Chief Complaint  Patient presents with  . Abdominal Pain  . Diarrhea    HPI [name] is a 53 y.o. male.  HPI   53yM with abdominal pain and diarrhea. Onset 3d ago. No blood in stool. No fever. No sick contacts, recent abx use, significant travel, etc. Also c/o cough and congestion. No dyspnea. Feels weak and some body aches.   Past Medical History:  Diagnosis Date  . Anxiety   . Arthritis   . Asthma   . Depression   . History of Clostridium difficile colitis 04/22/2017  . History of pulmonary embolus (PE) 03/17/2013   Overview:  post hip surgery, on blood thinners at the time  . HIV (human immunodeficiency virus infection) (HCC)   . Hypokalemia   . Hypotension 01/25/2015  . Immune deficiency disorder (HCC)   . Infectious folliculitis 01/30/2017  . MRSA (methicillin resistant staph aureus) culture positive   . Sepsis (HCC)   . Streptococcal pneumonia [facility](HCC)     Patient Active Problem List   Diagnosis Date Noted  . Healthcare maintenance 07/06/2018  . Numbness of right lower extremity 07/06/2018  . Heartburn 07/06/2018  . 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 S/P hip replacement 02/09/2018  . Essential hypertension   . Lung abscess (HCC) 06/21/2017  . Lung nodule, multiple 06/14/2017  . Asthma 04/22/2017  . Folliculitis 01/30/2017  . Complication of implanted device 07/24/2015  . 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 GERD (gastroesophageal reflux disease) 01/25/2015  . HIV (human immunodeficiency virus infection) (HCC) 01/24/2015  . Primary osteoarthritis of both hips 03/29/2013  . 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 hip arthroplasty Left  2016   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, I&D with replacement in 2017  . TOTAL HIP ARTHROPLASTY Right 02/09/2018   Procedure: TOTAL HIP ARTHROPLASTY ANTERIOR APPROACH;  Surgeon: Durene Romanslin, Matthew, MD;  Location: [facility] ORS;  Service: Orthopedics;  Laterality: Right;  70 mins        Home Medications    Prior to Admission medications   Medication Sig Start Date End Date Taking? Authorizing Provider  acetaminophen (TYLENOL) 500 MG tablet Take 2 tablets (1,000 mg total) by mouth every 8 (eight) hours. Patient not taking: Reported on 08/05/2018 02/10/18   Lanney GinsBabish, Matthew, PA-C  albuterol (PROVENTIL HFA;VENTOLIN HFA) 108 (90 Base) MCG/ACT inhaler Inhale 2 puffs into the lungs every 6 (six) hours as needed for wheezing or shortness of breath. 06/22/18   Veryl Speakalone, Gregory D, FNP  benzonatate (TESSALON) 100 MG capsule Take 1 capsule (100 mg total) by mouth 3 (three) times daily as needed for cough. Patient not taking: Reported on 08/05/2018 05/20/18   Robinson, [place] N, PA-C  Darunavir-Cobicisctat-Emtricitabine-Tenofovir Alafenamide [facility](SYMTUZA) 800-150-200-10 MG TABS Take 1 tablet by mouth daily with breakfast. 07/06/18   Veryl Speakalone, Gregory D, FNP  fluconazole (DIFLUCAN) 200 MG tablet TAKE 1 TABLET BY MOUTH ONCE DAILY 08/25/18   Veryl Speakalone, Gregory D, FNP  gabapentin (NEURONTIN) 100 MG capsule TAKE 1 CAPSULE BY MOUTH THREE TIMES A DAY 08/25/18   Veryl Speakalone, Gregory D, FNP  gabapentin (NEURONTIN) 600 MG tablet TAKE 2 TABLETS(1200 MG) BY MOUTH THREE TIMES DAILY 08/30/18   Veryl Speakalone, Gregory D,  FNP  meloxicam (MOBIC) 15 MG tablet Take 1 tablet (15 mg total) by mouth daily. 08/31/18   Raeford RazorKohut, Kelena Garrow, MD  methocarbamol (ROBAXIN) 500 MG tablet Take 1 tablet (500 mg total) by mouth every 6 (six) hours as needed for muscle spasms. Patient not taking: Reported on 08/05/2018 02/10/18   Lanney GinsBabish, Matthew, PA-C  Multiple Vitamins-Minerals (MENS MULTIVITAMIN PLUS PO) Take 1 tablet by mouth 2 (two) times daily.    [provider]   omeprazole (PRILOSEC) 20 MG capsule Take 1 capsule (20 mg total) by mouth daily. 07/06/18   Veryl Speakalone, Gregory D, FNP  Prenatal Vit-Fe Fumarate-FA (PRENATAL MULTIVITAMIN) TABS tablet Take 1 tablet by mouth 2 (two) times daily.     [provider]  TIVICAY 50 MG tablet TAKE 1 TABLET BY MOUTH ONCE DAILY 08/25/18   Veryl Speakalone, Gregory D, FNP  valACYclovir (VALTREX) 500 MG tablet TAKE 1 TABLET(500 MG) BY MOUTH DAILY 08/16/18   Daiva EvesVan Dam, Lisette Grinderornelius N, MD    Family History Family History  Problem Relation Age of Onset  . Cancer Mother   . CAD Brother     Social History Social History   Tobacco Use  . Smoking status: Former Smoker    Types: Cigarettes  . Smokeless tobacco: Current User    Types: Chew  Substance Use Topics  . Alcohol use: Yes    Frequency: Never    Comment: seldom  . Drug use: No     Allergies   Hydrocodone, Sulfa antibiotics, and Tramadol   Review of Systems Review of Systems  All systems reviewed and negative, other than as noted in HPI.  Physical Exam Updated Vital Signs BP 134/79   Pulse 72   Temp 98 F (36.7 C) (Oral)   Resp 20   Ht 6\' 2"  (1.88 m)   Wt 103 kg   SpO2 96%   BMI 29.16 kg/m   Physical Exam Vitals signs and nursing note reviewed.  Constitutional:      General: He is not in acute distress.    Appearance: He is well-developed.  HENT:     Head: Normocephalic and atraumatic.  Eyes:     General:        Right eye: No discharge.        Left eye: No discharge.     Conjunctiva/sclera: Conjunctivae normal.  Neck:     Musculoskeletal: Neck supple.  Cardiovascular:     Rate and Rhythm: Normal rate and regular rhythm.     Heart sounds: Normal heart sounds. No murmur. No friction rub. No gallop.   Pulmonary:     Effort: Pulmonary effort is normal. No respiratory distress.     Breath sounds: Normal breath sounds.  Abdominal:     General: There is no distension.     Palpations: Abdomen is soft.     Tenderness: There is no abdominal  tenderness.  Musculoskeletal:        General: No tenderness.     Comments: Lower extremities symmetric as compared to each other. No calf tenderness. Negative Homan's. No palpable cords.   Skin:    General: Skin is warm and dry.  Neurological:     Mental Status: He is alert.  Psychiatric:        Behavior: Behavior normal.        Thought Content: Thought content normal.      ED Treatments / Results  Labs (all labs ordered are listed, but only abnormal results are displayed) Labs Reviewed  COMPREHENSIVE METABOLIC  PANEL - Abnormal; Notable for the following components:      Result Value   CO2 21 (*)    Creatinine, Ser 1.25 (*)    Calcium 8.7 (*)    All other components within normal limits  CBC WITH DIFFERENTIAL/PLATELET - Abnormal; Notable for the following components:   RDW 15.9 (*)    Platelets 144 (*)    All other components within normal limits  NOVEL CORONAVIRUS, NAA (HOSPITAL ORDER, SEND-OUT TO REF LAB)    EKG    Radiology Dg Chest Portable 1 View  Result Date: 08/31/2018 CLINICAL DATA:  Diarrhea, abdominal pain for 3 days EXAM: PORTABLE CHEST 1 VIEW COMPARISON:  05/20/2018 FINDINGS: There is left basilar atelectasis. There is no focal consolidation. There is no pleural effusion or pneumothorax. The heart and mediastinal contours are unremarkable. There is no acute osseous abnormality. IMPRESSION: No active disease. Electronically Signed   By: Kathreen Devoid   On: 08/31/2018 15:53    Procedures Procedures (including critical care time)  Medications Ordered in ED Medications  sodium chloride 0.9 % bolus 1,000 mL ( Intravenous Stopped 08/31/18 1657)  loperamide (IMODIUM) capsule 4 mg (4 mg Oral Given 08/31/18 1549)  HYDROmorphone (DILAUDID) injection 0.5 mg (0.5 mg Intravenous Given 08/31/18 1550)  ketorolac (TORADOL) 15 MG/ML injection 15 mg (15 mg Intravenous Given 08/31/18 1549)     Initial Impression / Assessment and Plan / ED Course  I have reviewed the triage  vital signs and the nursing notes.  Pertinent labs & imaging results that were available during my care of the patient were reviewed by me and considered in my medical decision making (see chart for details).  53yM with diarrhea. Hx of cdiff but no recent factors otherwise to suggest. Unable to provide sample in the ED for testing. Viral illness? CXR fine. Feeling better after meds.  Final Clinical Impressions(s) / ED Diagnoses   Final diagnoses:  Diarrhea, unspecified type  Cough    ED Discharge Orders         Ordered    meloxicam (MOBIC) 15 MG tablet  Daily     08/31/18 1732           Virgel Manifold, MD 09/01/18 1131

## 2018-09-10 ENCOUNTER — Other Ambulatory Visit: Payer: Self-pay | Admitting: Family Medicine

## 2018-09-13 ENCOUNTER — Other Ambulatory Visit: Payer: Self-pay | Admitting: Family

## 2018-09-13 DIAGNOSIS — B20 Human immunodeficiency virus [HIV] disease: Secondary | ICD-10-CM

## 2018-10-01 ENCOUNTER — Other Ambulatory Visit: Payer: Self-pay | Admitting: *Deleted

## 2018-10-25 ENCOUNTER — Other Ambulatory Visit: Payer: Self-pay

## 2018-10-25 ENCOUNTER — Ambulatory Visit (INDEPENDENT_AMBULATORY_CARE_PROVIDER_SITE_OTHER): Payer: Medicare Other | Admitting: Family

## 2018-10-25 ENCOUNTER — Encounter: Payer: Self-pay | Admitting: Family

## 2018-10-25 VITALS — Wt 224.0 lb

## 2018-10-25 DIAGNOSIS — F418 Other specified anxiety disorders: Secondary | ICD-10-CM

## 2018-10-25 DIAGNOSIS — F410 Panic disorder [episodic paroxysmal anxiety] without agoraphobia: Secondary | ICD-10-CM | POA: Diagnosis not present

## 2018-10-25 DIAGNOSIS — Z21 Asymptomatic human immunodeficiency virus [HIV] infection status: Secondary | ICD-10-CM | POA: Diagnosis not present

## 2018-10-25 DIAGNOSIS — F411 Generalized anxiety disorder: Secondary | ICD-10-CM

## 2018-10-25 NOTE — Patient Instructions (Addendum)
We will continue your medication.   Please contact counseling services at:  [phone]  I will write a note for your lawyer.  We will need to obtain blood work soon.   Continue to follow the 3 Ws.   We will schedule an e-visit soon.

## 2018-10-25 NOTE — Assessment & Plan Note (Signed)
[name] has severe levels of anxiety to near panic attack levels regarding concerns for being out of the house and chance of contracting coronavirus. He is inconsolable during the office visit. No evidence of psychosis. He is in no condition to be having a court appearance tomorrow and note has been written to reflect this. Recommend counseling with resources provided in AVS.

## 2018-10-25 NOTE — Assessment & Plan Note (Signed)
Unable to check lab work today due to his increased levels of anxiety and fear. Previously well controlled with Symtuza and [place]. No signs/symptoms of opportunistic infection or progressive HIV disease. Continue current dose of Tivicay and Symtuza. Will work to obtain lab work when able. Recommend counseling support to overcome anxiety.

## 2018-10-25 NOTE — Progress Notes (Signed)
Subjective:    Patient ID: [name], male    DOB: 06/06/1965, 53 y.o.   MRN: [id number]  Chief Complaint  Patient presents with  . HIV Positive/AIDS     HPI:  [name] is a 53 y.o. male with HIV disease who was last seen in the office on 07/06/18 with good adherence and tolerance to his ART regimen of Symtuza and Tivicay.  CD4 count was 200 with a viral load that was undetectable.  In the interim [facility] with [facility] and was tested negative coronavirus and diagnosed with viral infection.  [name] has been taking his Symtuza enteric as prescribed no adverse side effects or missed doses.  He has not been out of the house in the last 3 months and it is extremely anxious today with concern for coronavirus.  He continually cleans his house on a daily basis and his son has been bringing him food.  He does live with his mother.  He has concerns due to the chance of a respiratory virus and has been told he has a 50% chance of death if he has coronavirus.  [name] has no problems obtaining his medication from the pharmacy and remains covered through Medicare.  He has severe levels of anxiety today.  No recreational or illicit drug use, tobacco use, or alcohol consumption presently.  He is not currently sexually active as he remains at home and declines condoms.   Allergies  Allergen Reactions  . Hydrocodone Other (See Comments)    "makes me overly sleepy"  . Sulfa Antibiotics Rash  . Tramadol Nausea Only      Outpatient Medications Prior to Visit  Medication Sig Dispense Refill  . Darunavir-Cobicisctat-Emtricitabine-Tenofovir Alafenamide (SYMTUZA) 800-150-200-10 MG TABS Take 1 tablet by mouth daily with breakfast. 30 tablet 5  . gabapentin (NEURONTIN) 600 MG tablet TAKE 2 TABLETS(1200 MG) BY MOUTH THREE TIMES DAILY 180 tablet 1  . meloxicam (MOBIC) 15 MG tablet Take 1 tablet (15 mg total) by mouth daily. 10 tablet 0  . Multiple  Vitamins-Minerals (MENS MULTIVITAMIN PLUS PO) Take 1 tablet by mouth 2 (two) times daily.    Marland Kitchen. omeprazole (PRILOSEC) 20 MG capsule Take 1 capsule (20 mg total) by mouth daily. 30 capsule 5  . TIVICAY 50 MG tablet TAKE 1 TABLET BY MOUTH ONCE DAILY 30 tablet 4  . valACYclovir (VALTREX) 500 MG tablet TAKE 1 TABLET(500 MG) BY MOUTH DAILY 30 tablet 11  . acetaminophen (TYLENOL) 500 MG tablet Take 2 tablets (1,000 mg total) by mouth every 8 (eight) hours. (Patient not taking: Reported on 08/05/2018) 30 tablet 0  . benzonatate (TESSALON) 100 MG capsule Take 1 capsule (100 mg total) by mouth 3 (three) times daily as needed for cough. (Patient not taking: Reported on 08/05/2018) 21 capsule 0  . fluconazole (DIFLUCAN) 200 MG tablet TAKE 1 TABLET BY MOUTH ONCE DAILY 7 tablet 4  . methocarbamol (ROBAXIN) 500 MG tablet Take 1 tablet (500 mg total) by mouth every 6 (six) hours as needed for muscle spasms. (Patient not taking: Reported on 08/05/2018) 40 tablet 0  . Prenatal Vit-Fe Fumarate-FA (PRENATAL MULTIVITAMIN) TABS tablet Take 1 tablet by mouth 2 (two) times daily.     . VENTOLIN HFA 108 (90 Base) MCG/ACT inhaler INHALE 2 PUFFS INTO THE LUNGS EVERY 6 HOURS AS NEEDED FOR WHEEZING OR SHORTNESS OF BREATH 18 g 1   No facility-administered medications prior to visit.  Past Medical History:  Diagnosis Date  . Anxiety   . Arthritis   . Asthma   . Depression   . History of Clostridium difficile colitis 04/22/2017  . History of pulmonary embolus (PE) 03/17/2013   Overview:  post hip surgery, on blood thinners at the time  . HIV (human immunodeficiency virus infection) (HCC)   . Hypokalemia   . Hypotension 01/25/2015  . Immune deficiency disorder (HCC)   . Infectious folliculitis 01/30/2017  . MRSA (methicillin resistant staph aureus) culture positive   . Sepsis (HCC)   . Streptococcal pneumonia [facility](HCC)      Past Surgical History:  Procedure Laterality Date  . hip arthroplasty Left 2016   2016 hip  arthroplasty became infected, I&D with replacement in 2017  . TOTAL HIP ARTHROPLASTY Right 02/09/2018   Procedure: TOTAL HIP ARTHROPLASTY ANTERIOR APPROACH;  Surgeon: Durene Romanslin, Matthew, MD;  Location: [facility];  Service: Orthopedics;  Laterality: Right;  70 mins       Review of Systems  Constitutional: Negative for appetite change, chills, fatigue, fever and unexpected weight change.  Eyes: Negative for visual disturbance.  Respiratory: Negative for cough, chest tightness, shortness of breath and wheezing.   Cardiovascular: Negative for chest pain and leg swelling.  Gastrointestinal: Negative for abdominal pain, constipation, diarrhea, nausea and vomiting.  Genitourinary: Negative for dysuria, flank pain, frequency, genital sores, hematuria and urgency.  Skin: Negative for rash.  Allergic/Immunologic: Negative for immunocompromised state.  Neurological: Negative for dizziness and headaches.  Psychiatric/Behavioral: Positive for decreased concentration. Negative for sleep disturbance and suicidal ideas. The patient is nervous/anxious and is hyperactive.       Objective:    Wt 224 lb (101.6 kg)   BMI 28.76 kg/m  Nursing note and vital signs reviewed.  Physical Exam Constitutional:      General: He is not in acute distress.    Appearance: He is well-developed.     Comments: Standing in the corner of the room with gloves and double masks on his face. He has shear panic look on his face and appears to be close to panic attack levels.   Eyes:     Conjunctiva/sclera: Conjunctivae normal.  Neck:     Musculoskeletal: Neck supple.  Cardiovascular:     Rate and Rhythm: Normal rate and regular rhythm.     Heart sounds: Normal heart sounds. No murmur. No friction rub. No gallop.   Pulmonary:     Effort: Pulmonary effort is normal. No respiratory distress.     Breath sounds: Normal breath sounds. No wheezing or rales.  Chest:     Chest wall: No tenderness.  Abdominal:     General: Bowel  sounds are normal.     Palpations: Abdomen is soft.     Tenderness: There is no abdominal tenderness.  Lymphadenopathy:     Cervical: No cervical adenopathy.  Skin:    General: Skin is warm and dry.     Findings: No rash.  Neurological:     Mental Status: He is alert and oriented to person, place, and time.  Psychiatric:        Behavior: Behavior normal.        Thought Content: Thought content normal.        Judgment: Judgment normal.      [facility] 04/05/2018 02/08/2018 05/04/2017  Decreased Interest 0 0 0  Down, Depressed, Hopeless 0 0 0  PHQ - 2 Score 0 0 0       Assessment &  Plan:   Problem List Items Addressed This Visit      Other   HIV (human immunodeficiency virus infection) ([place]) - Primary    Unable to check lab work today due to his increased levels of anxiety and fear. Previously well controlled with Symtuza and [place]. No signs/symptoms of opportunistic infection or progressive HIV disease. Continue current dose of Tivicay and Symtuza. Will work to obtain lab work when able. Recommend counseling support to overcome anxiety.       Generalized anxiety disorder with panic attacks   Anxiety about health    [name] has severe levels of anxiety to near panic attack levels regarding concerns for being out of the house and chance of contracting coronavirus. He is inconsolable during the office visit. No evidence of psychosis. He is in no condition to be having a court appearance tomorrow and note has been written to reflect this. Recommend counseling with resources provided in AVS.           I am having [name] maintain his prenatal multivitamin, Multiple Vitamins-Minerals (MENS MULTIVITAMIN PLUS PO), methocarbamol, acetaminophen, benzonatate, Darunavir-Cobicisctat-Emtricitabine-Tenofovir Alafenamide, omeprazole, valACYclovir, Tivicay, fluconazole, gabapentin, meloxicam, and Ventolin HFA.   Follow-up: Return in about 1 month (around  11/25/2018), or if symptoms worsen or fail to improve.   Terri Piedra, MSN, FNP-C Nurse Practitioner [facility] [place] number: [phone]

## 2018-10-26 ENCOUNTER — Encounter: Payer: Self-pay | Admitting: Family

## 2018-10-26 ENCOUNTER — Ambulatory Visit: Payer: Medicare Other | Admitting: Family

## 2018-10-28 ENCOUNTER — Other Ambulatory Visit: Payer: Self-pay | Admitting: Family

## 2018-10-28 DIAGNOSIS — B20 Human immunodeficiency virus [HIV] disease: Secondary | ICD-10-CM

## 2018-11-06 ENCOUNTER — Emergency Department (HOSPITAL_BASED_OUTPATIENT_CLINIC_OR_DEPARTMENT_OTHER)
Admission: EM | Admit: 2018-11-06 | Discharge: 2018-11-06 | Disposition: A | Discharge status: Home / Self Care | Payer: Medicare Other | Attending: Emergency Medicine | Admitting: Emergency Medicine

## 2018-11-06 ENCOUNTER — Encounter (HOSPITAL_BASED_OUTPATIENT_CLINIC_OR_DEPARTMENT_OTHER): Payer: Self-pay | Admitting: Emergency Medicine

## 2018-11-06 ENCOUNTER — Emergency Department (HOSPITAL_BASED_OUTPATIENT_CLINIC_OR_DEPARTMENT_OTHER): Payer: Medicare Other

## 2018-11-06 ENCOUNTER — Other Ambulatory Visit: Payer: Self-pay

## 2018-11-06 DIAGNOSIS — Z79899 Other long term (current) drug therapy: Secondary | ICD-10-CM | POA: Diagnosis not present

## 2018-11-06 DIAGNOSIS — Z21 Asymptomatic human immunodeficiency virus [HIV] infection status: Secondary | ICD-10-CM | POA: Insufficient documentation

## 2018-11-06 DIAGNOSIS — Z86711 Personal history of pulmonary embolism: Secondary | ICD-10-CM | POA: Diagnosis not present

## 2018-11-06 DIAGNOSIS — I712 Thoracic aortic aneurysm, without rupture: Secondary | ICD-10-CM | POA: Diagnosis not present

## 2018-11-06 DIAGNOSIS — Z87891 Personal history of nicotine dependence: Secondary | ICD-10-CM | POA: Insufficient documentation

## 2018-11-06 DIAGNOSIS — U071 COVID-19: Secondary | ICD-10-CM | POA: Diagnosis not present

## 2018-11-06 DIAGNOSIS — R0602 Shortness of breath: Secondary | ICD-10-CM | POA: Diagnosis not present

## 2018-11-06 LAB — TROPONIN I (HIGH SENSITIVITY): Troponin I (High Sensitivity): 3 ng/L (ref <18)

## 2018-11-06 LAB — CBC WITH DIFFERENTIAL/PLATELET
Abs Immature Granulocytes: 0.01 10*3/uL (ref 0.00–0.07)
Basophils Absolute: 0 10*3/uL (ref 0.0–0.1)
Basophils Relative: 0 %
Eosinophils Absolute: 0.3 10*3/uL (ref 0.0–0.5)
Eosinophils Relative: 7 %
HCT: 41.9 % (ref 39.0–52.0)
Hemoglobin: 14.1 g/dL (ref 13.0–17.0)
Immature Granulocytes: 0 %
Lymphocytes Relative: 35 %
Lymphs Abs: 1.4 10*3/uL (ref 0.7–4.0)
MCH: 28.4 pg (ref 26.0–34.0)
MCHC: 33.7 g/dL (ref 30.0–36.0)
MCV: 84.5 fL (ref 80.0–100.0)
Monocytes Absolute: 0.3 10*3/uL (ref 0.1–1.0)
Monocytes Relative: 8 %
Neutro Abs: 2 10*3/uL (ref 1.7–7.7)
Neutrophils Relative %: 50 %
Platelets: 140 10*3/uL — ABNORMAL LOW (ref 150–400)
RBC: 4.96 MIL/uL (ref 4.22–5.81)
RDW: 15.3 % (ref 11.5–15.5)
WBC: 4.1 10*3/uL (ref 4.0–10.5)
nRBC: 0 % (ref 0.0–0.2)

## 2018-11-06 LAB — BASIC METABOLIC PANEL
Anion gap: 9 (ref 5–15)
BUN: 18 mg/dL (ref 6–20)
CO2: 20 mmol/L — ABNORMAL LOW (ref 22–32)
Calcium: 8.5 mg/dL — ABNORMAL LOW (ref 8.9–10.3)
Chloride: 106 mmol/L (ref 98–111)
Creatinine, Ser: 1.22 mg/dL (ref 0.61–1.24)
GFR calc Af Amer: >60 mL/min (ref >60)
GFR calc non Af Amer: >60 mL/min (ref >60)
Glucose, Bld: 86 mg/dL (ref 70–99)
Potassium: 3.8 mmol/L (ref 3.5–5.1)
Sodium: 135 mmol/L (ref 135–145)

## 2018-11-06 LAB — SARS CORONAVIRUS 2 BY RT PCR (HOSPITAL ORDER, PERFORMED IN ~~LOC~~ HOSPITAL LAB): SARS Coronavirus 2: POSITIVE — AB

## 2018-11-06 MED ORDER — IOHEXOL 350 MG/ML SOLN
100.0000 mL | Freq: Once | INTRAVENOUS | Status: AC | PRN
  Administered 2018-11-06: 100 mL via INTRAVENOUS

## 2018-11-06 MED ORDER — SODIUM CHLORIDE 0.9 % IV BOLUS
1000.0000 mL | Freq: Once | INTRAVENOUS | Status: AC
  Administered 2018-11-06: 1000 mL via INTRAVENOUS

## 2018-11-06 NOTE — ED Triage Notes (Signed)
SOB, headache, cough x 4 days. Girlfriend pos for COVID. He took a few leftover doxycycline.

## 2018-11-06 NOTE — Discharge Instructions (Addendum)
Your COVID-19 test was positive and you have some findings associated with this on the CT of your chest.  You do not have a blood clot in your lung. Please follow-up with your primary care provider. There are no current recommendations for outpatient treatment of COVID-19 and you would be more at risk for side effects from these medications. Return to the ED for worsening symptoms, shortness of breath, chest pain, leg swelling, vomiting coughing up blood.

## 2018-11-06 NOTE — ED Notes (Signed)
ED Provider at bedside. 

## 2018-11-06 NOTE — ED Provider Notes (Signed)
[place] EMERGENCY DEPARTMENT Provider Note   CSN: [id number] Arrival date & time: 11/06/18  1518     History   Chief Complaint Chief Complaint  Patient presents with   Shortness of Breath    HPI IDA UPPAL is a 53 y.o. male with a past medical history of HIV last CD4 count of 240, currently compliant with his ART therapy, prior PE not currently anticoagulated presents to ED for 4-day history of shortness of breath, chills, cough productive with mucus, generalized body aches, nausea.  States that he has been in contact with his girlfriend who tested positive for COVID-19 2 days ago.  She began feeling symptoms the same time that patient did.  Patient had some leftover doxycycline tablets from hospitalization 2 years ago for pneumonia which she was taking but denies any improvement with these medications.  He denies any other over-the-counter medications.  He denies chest pain, hemoptysis, leg swelling, vomiting, abdominal pain, injuries or falls.     HPI  Past Medical History:  Diagnosis Date   Anxiety    Arthritis    Asthma    Depression    History of Clostridium difficile colitis 04/22/2017   History of pulmonary embolus (PE) 03/17/2013   Overview:  post hip surgery, on blood thinners at the time   HIV (human immunodeficiency virus infection) ([place])    Hypokalemia    Hypotension 01/25/2015   Immune deficiency disorder ([place])    Infectious folliculitis 42/59/5638   MRSA (methicillin resistant staph aureus) culture positive    Sepsis ([place])    Streptococcal pneumonia [facility])     Patient Active Problem List   Diagnosis Date Noted   Generalized anxiety disorder with panic attacks 10/25/2018   Anxiety about health 10/25/2018   Healthcare maintenance 07/06/2018   Numbness of right lower extremity 07/06/2018   Heartburn 07/06/2018   S/P right THA, AA 02/09/2018   S/P hip replacement 02/09/2018   Essential hypertension    Lung abscess  ([place]) 06/21/2017   Lung nodule, multiple 06/14/2017   Asthma 75/64/3329   Folliculitis 51/88/4166   Complication of implanted device 07/24/2015   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/2015   Severe episode of recurrent major depressive disorder, without psychotic features ([place]) 04/27/2015   GERD (gastroesophageal reflux disease) 01/25/2015   HIV (human immunodeficiency virus infection) ([place]) 01/24/2015   Primary osteoarthritis of both hips 03/29/2013   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 Authorizing Provider  acetaminophen (TYLENOL) 500 MG tablet Take 2 tablets (1,000 mg total) by mouth every 8 (eight) hours. Patient not taking: Reported on 08/05/2018 02/10/18   Danae Orleans, PA-C  benzonatate (TESSALON) 100 MG capsule Take 1 capsule (100 mg total) by mouth 3 (three) times daily as needed for cough. Patient not taking: Reported on 08/05/2018 05/20/18   Robinson, [place] N, PA-C  Darunavir-Cobicisctat-Emtricitabine-Tenofovir Alafenamide [facility]) 800-150-200-10 MG TABS Take 1 tablet by mouth daily with breakfast. 07/06/18   [place] Circle, FNP  fluconazole (DIFLUCAN) 200  MG tablet TAKE 1 TABLET BY MOUTH ONCE DAILY 08/25/18   Veryl Speakalone, Gregory D, FNP  gabapentin (NEURONTIN) 600 MG tablet TAKE 2 TABLETS(1200 MG) BY MOUTH THREE TIMES DAILY 10/28/18   Veryl Speakalone, Gregory D, FNP  meloxicam (MOBIC) 15 MG tablet Take 1 tablet (15 mg total) by mouth daily. 08/31/18   Raeford RazorKohut, Stephen, MD  methocarbamol (ROBAXIN) 500 MG tablet  Take 1 tablet (500 mg total) by mouth every 6 (six) hours as needed for muscle spasms. Patient not taking: Reported on 08/05/2018 02/10/18   Lanney GinsBabish, Matthew, PA-C  Multiple Vitamins-Minerals (MENS MULTIVITAMIN PLUS PO) Take 1 tablet by mouth 2 (two) times daily.    [provider]  omeprazole (PRILOSEC) 20 MG capsule Take 1 capsule (20 mg total) by mouth daily. 07/06/18   Veryl Speakalone, Gregory D, FNP  Prenatal Vit-Fe Fumarate-FA (PRENATAL MULTIVITAMIN) TABS tablet Take 1 tablet by mouth 2 (two) times daily.     [provider]  TIVICAY 50 MG tablet TAKE 1 TABLET BY MOUTH ONCE DAILY 08/25/18   Veryl Speakalone, Gregory D, FNP  valACYclovir (VALTREX) 500 MG tablet TAKE 1 TABLET(500 MG) BY MOUTH DAILY 08/16/18   Daiva EvesVan Dam, Lisette Grinderornelius N, MD  VENTOLIN HFA [phone](90 Base) MCG/ACT inhaler INHALE 2 PUFFS INTO THE LUNGS EVERY 6 HOURS AS NEEDED FOR WHEEZING OR SHORTNESS OF BREATH 09/13/18   Veryl Speakalone, Gregory D, FNP    Family History Family History  Problem Relation Age of Onset   Cancer Mother    CAD Brother     Social History Social History   Tobacco Use   Smoking status: Former Smoker    Types: Cigarettes   Smokeless tobacco: Current User    Types: Chew  Substance Use Topics   Alcohol use: Yes    Frequency: Never    Comment: seldom   Drug use: No     Allergies   Hydrocodone, Sulfa antibiotics, and Tramadol   Review of Systems Review of Systems  Constitutional: Positive for chills. Negative for appetite change and fever.  HENT: Negative for ear pain, rhinorrhea, sneezing and sore throat.   Eyes: Negative for photophobia and visual disturbance.  Respiratory: Positive for cough and shortness of breath. Negative for chest tightness and wheezing.   Cardiovascular: Negative for chest pain and palpitations.  Gastrointestinal: Positive for nausea. Negative for abdominal pain, blood in stool, constipation, diarrhea and vomiting.  Genitourinary: Negative for dysuria, hematuria and urgency.    Musculoskeletal: Positive for myalgias.  Skin: Negative for rash.  Neurological: Negative for dizziness, weakness and light-headedness.     Physical Exam Updated Vital Signs BP (!) 137/105    Pulse (!) 56    Temp 98.7 F (37.1 C) (Oral)    Resp 17    Ht 6\' 2"  (1.88 m)    Wt 104.3 kg    SpO2 99%    BMI 29.53 kg/m   Physical Exam Vitals signs and nursing note reviewed.  Constitutional:      General: He is not in acute distress.    Appearance: He is well-developed.  HENT:     Head: Normocephalic and atraumatic.     Nose: Nose normal.  Eyes:     General: No scleral icterus.       Left eye: No discharge.     Conjunctiva/sclera: Conjunctivae normal.  Neck:     Musculoskeletal: Normal range of motion and neck supple.  Cardiovascular:     Rate and Rhythm: Normal rate and regular rhythm.     Heart sounds:  Normal heart sounds. No murmur. No friction rub. No gallop.   Pulmonary:     Effort: Pulmonary effort is normal. No respiratory distress.     Breath sounds: Normal breath sounds.  Abdominal:     General: Bowel sounds are normal. There is no distension.     Palpations: Abdomen is soft.     Tenderness: There is no abdominal tenderness. There is no guarding.  Musculoskeletal: Normal range of motion.  Skin:    General: Skin is warm and dry.     Findings: No rash.  Neurological:     Mental Status: He is alert.     Motor: No abnormal muscle tone.     Coordination: Coordination normal.      ED Treatments / Results  Labs (all labs ordered are listed, but only abnormal results are displayed) Labs Reviewed  SARS CORONAVIRUS 2 (HOSPITAL ORDER, PERFORMED IN [facility]) - Abnormal; Notable for the following components:      Result Value   SARS Coronavirus 2 POSITIVE (*)    All other components within normal limits  BASIC METABOLIC PANEL - Abnormal; Notable for the following components:   CO2 20 (*)    Calcium 8.5 (*)    All other components within normal limits   CBC WITH DIFFERENTIAL/PLATELET - Abnormal; Notable for the following components:   Platelets 140 (*)    All other components within normal limits  TROPONIN I (HIGH SENSITIVITY)    EKG EKG Interpretation  Date/Time:  Saturday November 06 2018 16:01:07 EDT Ventricular Rate:  60 PR Interval:    QRS Duration: 104 QT Interval:  435 QTC Calculation: 435 R Axis:   42 Text Interpretation:  Sinus rhythm No significant change since last tracing Confirmed by Melene Plan [phone]) on 11/06/2018 4:18:27 PM   Radiology Ct Angio Chest Pe W/cm &/or Wo Cm  Result Date: 11/06/2018 CLINICAL DATA:  Fever, weakness.  Positive COVID test. EXAM: CT ANGIOGRAPHY CHEST WITH CONTRAST TECHNIQUE: Multidetector CT imaging of the chest was performed using the standard protocol during bolus administration of intravenous contrast. Multiplanar CT image reconstructions and MIPs were obtained to evaluate the vascular anatomy. CONTRAST:  OMNIPAQUE IOHEXOL 350 MG/ML SOLN COMPARISON:  01/11/2018 FINDINGS: Cardiovascular: No filling defects in the pulmonary arteries to suggestpulmonary emboli. Heart is normal size. Mild aneurysmal dilatation of the ascending thoracic aorta, 4.2 cm. Mediastinum/Nodes: No mediastinal, hilar, or axillary adenopathy. Trachea and esophagus are unremarkable. Thyroid unremarkable. Small hiatal hernia. Lungs/Pleura: Peripheral airspace opacities in the inferior lingula. No effusions. Upper Abdomen: Calcifications throughout the spleen compatible with old granulomatous disease. No acute findings. Musculoskeletal: Chest wall soft tissues are unremarkable. No acute bony abnormality. Review of the MIP images confirms the above findings. IMPRESSION: No evidence of pulmonary embolus. 4.2 cm ascending thoracic aortic aneurysm. Recommend annual imaging followup by CTA or MRA. This recommendation follows 2010 ACCF/AHA/AATS/ACR/ASA/SCA/SCAI/SIR/STS/SVM Guidelines for the Diagnosis and Management of Patients with  Thoracic Aortic Disease. Circulation. 2010; 121: U045-W098. Aortic aneurysm NOS (ICD10-I71.9) Patchy ground-glass airspace opacities in the inferior lingula could reflect early infiltrate. Electronically Signed   By: Charlett Nose M.D.   On: 11/06/2018 18:14   Dg Chest Portable 1 View  Result Date: 11/06/2018 CLINICAL DATA:  Shortness of breath, contact with COVID-19 EXAM: PORTABLE CHEST 1 VIEW COMPARISON:  08/31/2018. FINDINGS: The heart size and mediastinal contours are within normal limits. Minimal bandlike scarring of the left lung base unchanged from prior. The visualized skeletal structures are unremarkable. IMPRESSION: No acute  abnormality of the lungs in AP portable projection. Minimal bandlike scarring of the left lung base. Electronically Signed   By: Lauralyn PrimesAlex  Bibbey M.D.   On: 11/06/2018 16:58    Procedures Procedures (including critical care time)  Medications Ordered in ED Medications  sodium chloride 0.9 % bolus 1,000 mL (0 mLs Intravenous Stopped 11/06/18 1857)  iohexol (OMNIPAQUE) 350 MG/ML injection 100 mL (100 mLs Intravenous Contrast Given 11/06/18 1802)     Initial Impression / Assessment and Plan / ED Course  I have reviewed the triage vital signs and the nursing notes.  Pertinent labs & imaging results that were available during my care of the patient were reviewed by me and considered in my medical decision making (see chart for details).        Lesle ReekLarry D Jorgenson was evaluated in Emergency Department on 11/06/2018 for the symptoms described in the history of present illness. He was evaluated in the context of the global COVID-19 pandemic, which necessitated consideration that the patient might be at risk for infection with the SARS-CoV-2 virus that causes COVID-19. Institutional protocols and algorithms that pertain to the evaluation of patients at risk for COVID-19 are in a state of rapid change based on information released by regulatory bodies including the CDC and federal  and state organizations. These policies and algorithms were followed during the patient's care in the ED.  53 year old male with a past medical history of HIV compliant with ART with last CD4 count of 240 in March 2020 presents to ED for shortness of breath, cough.  States that his girlfriend tested positive for COVID-19.  He is not requiring supplemental oxygen on my exam.  He is not tachypneic, tachycardic.  He does have a history of pneumonia for which he need to be admitted for last year.  No deficits neurological exam noted with his headache.  Reports diffuse generalized body aches as well.  CBC, BMP unremarkable.  Troponin is negative.  EKG shows sinus rhythm, no changes from prior tracings.  CT Angie of the chest with no evidence of PE but does show groundglass opacities consistent with his positive COVID test today.  Patient remains in no acute distress here in the ED and is not requiring supplemental oxygen.  I do not feel that admission for COVID-19 is necessary at this time although does he have a history of immunocompromise state.  Patient is comfortable with discharge home and quarantine.  No recommendations for antibiotics or steroids at this time as he is currently outpatient treatment.  We will have him follow-up with PCP and return for worsening symptoms. Patient discussed with my attending, Dr. Adela LankFloyd.  Patient is hemodynamically stable, in NAD, and able to ambulate in the ED. Evaluation does not show pathology that would require ongoing emergent intervention or inpatient treatment. I explained the diagnosis to the patient. Pain has been managed and has no complaints prior to discharge. Patient is comfortable with above plan and is stable for discharge at this time. All questions were answered prior to disposition. Strict return precautions for returning to the ED were discussed. Encouraged follow up with PCP.   An After Visit Summary was printed and given to the patient.   Portions of  this note were generated with Scientist, clinical (histocompatibility and immunogenetics)Dragon dictation software. Dictation errors may occur despite best attempts at proofreading.   Final Clinical Impressions(s) / ED Diagnoses   Final diagnoses:  COVID-19 virus infection    ED Discharge Orders    None  Dietrich PatesKhatri, Lamondre Wesche, PA-C 11/06/18 1920    Melene PlanFloyd, Dan, DO 11/06/18 2146

## 2018-12-02 ENCOUNTER — Other Ambulatory Visit: Payer: Self-pay | Admitting: Family

## 2018-12-02 DIAGNOSIS — B20 Human immunodeficiency virus [HIV] disease: Secondary | ICD-10-CM

## 2018-12-14 ENCOUNTER — Telehealth: Payer: Self-pay

## 2018-12-14 NOTE — Telephone Encounter (Signed)
Patient called office today complaining of scrotum pain. States that pain is a 6; states that for the past 3-4 months one side of his scrotum has been hurting. States yesterday was the first day both sides hurt. Patient also states he feels like their is pressure on lower stomach like he has an upset stomach. Is having issues using the bathroom. Denies any discharge. Patient has an upcoming appointment on 9/30 with FNP. Will forward message to provider so he is aware. [place]

## 2018-12-15 ENCOUNTER — Ambulatory Visit: Payer: Medicare Other | Admitting: Family

## 2018-12-22 ENCOUNTER — Other Ambulatory Visit: Payer: Self-pay | Admitting: Family

## 2018-12-22 DIAGNOSIS — B20 Human immunodeficiency virus [HIV] disease: Secondary | ICD-10-CM

## 2019-01-22 ENCOUNTER — Other Ambulatory Visit: Payer: Self-pay | Admitting: Family

## 2019-01-22 DIAGNOSIS — B20 Human immunodeficiency virus [HIV] disease: Secondary | ICD-10-CM

## 2019-01-30 ENCOUNTER — Other Ambulatory Visit: Payer: Self-pay | Admitting: Family

## 2019-01-30 DIAGNOSIS — B20 Human immunodeficiency virus [HIV] disease: Secondary | ICD-10-CM

## 2019-02-17 ENCOUNTER — Other Ambulatory Visit: Payer: Self-pay | Admitting: Family

## 2019-02-17 DIAGNOSIS — B20 Human immunodeficiency virus [HIV] disease: Secondary | ICD-10-CM

## 2019-02-24 ENCOUNTER — Telehealth: Payer: Self-pay | Admitting: *Deleted

## 2019-02-24 NOTE — Telephone Encounter (Signed)
Patient called to report that since August 2020, when he was covid positive, he has had a very hard time breathing. He has had shortness of breath, wheezing, cant talk long or walk without losing his breath, can not lay down without feeling he is suffocating and all this is daily. He goes through 2 inhalers a month (Ventolin), he denies any fever and has to keep his house at 64-66 degrees in order to breath at all. He is able to produce mucus but as soon as it comes up he is congested again. Advised him will let Marya Amsler know what is going on and see what we can do for him and someone will call him back.

## 2019-02-24 NOTE — Telephone Encounter (Signed)
Would recommend evaluation in a respiratory clinic or primary care for further evaluation. May need to go to Urgent Care to ensure he does not have pneumonia. If available a provider visit through video.

## 2019-02-24 NOTE — Telephone Encounter (Signed)
Per Marya Amsler response called the patient to advise him he should follow with his PCP or Urgent care to rule out Pneumonia. He advised we are his PCP and we know this. Advised him we are not PCP but specialist and he should definatly have a PCP for issues such as this. Advised him at this moment we need to figure out why he is having such a hard time breathing so he should be seen at an Urgent Care where they can do Xrays to get some immediate answers. He agreed as he feels bad and will call back to schedule an office visit later.

## 2019-02-25 ENCOUNTER — Emergency Department (HOSPITAL_BASED_OUTPATIENT_CLINIC_OR_DEPARTMENT_OTHER): Payer: Medicare Other

## 2019-02-25 ENCOUNTER — Encounter (HOSPITAL_BASED_OUTPATIENT_CLINIC_OR_DEPARTMENT_OTHER): Payer: Self-pay | Admitting: *Deleted

## 2019-02-25 ENCOUNTER — Other Ambulatory Visit: Payer: Self-pay

## 2019-02-25 ENCOUNTER — Emergency Department (HOSPITAL_BASED_OUTPATIENT_CLINIC_OR_DEPARTMENT_OTHER)
Admission: EM | Admit: 2019-02-25 | Discharge: 2019-02-25 | Disposition: A | Discharge status: Home / Self Care | Payer: Medicare Other | Attending: Emergency Medicine | Admitting: Emergency Medicine

## 2019-02-25 DIAGNOSIS — Z20828 Contact with and (suspected) exposure to other viral communicable diseases: Secondary | ICD-10-CM | POA: Insufficient documentation

## 2019-02-25 DIAGNOSIS — J45909 Unspecified asthma, uncomplicated: Secondary | ICD-10-CM | POA: Insufficient documentation

## 2019-02-25 DIAGNOSIS — Z87891 Personal history of nicotine dependence: Secondary | ICD-10-CM | POA: Insufficient documentation

## 2019-02-25 DIAGNOSIS — Z21 Asymptomatic human immunodeficiency virus [HIV] infection status: Secondary | ICD-10-CM | POA: Insufficient documentation

## 2019-02-25 DIAGNOSIS — R0602 Shortness of breath: Secondary | ICD-10-CM | POA: Diagnosis not present

## 2019-02-25 DIAGNOSIS — Z79899 Other long term (current) drug therapy: Secondary | ICD-10-CM | POA: Insufficient documentation

## 2019-02-25 DIAGNOSIS — I10 Essential (primary) hypertension: Secondary | ICD-10-CM | POA: Diagnosis not present

## 2019-02-25 LAB — CBC WITH DIFFERENTIAL/PLATELET
Abs Immature Granulocytes: 0.03 10*3/uL (ref 0.00–0.07)
Basophils Absolute: 0 10*3/uL (ref 0.0–0.1)
Basophils Relative: 0 %
Eosinophils Absolute: 0.2 10*3/uL (ref 0.0–0.5)
Eosinophils Relative: 3 %
HCT: 45.9 % (ref 39.0–52.0)
Hemoglobin: 15.7 g/dL (ref 13.0–17.0)
Immature Granulocytes: 0 %
Lymphocytes Relative: 23 %
Lymphs Abs: 1.6 10*3/uL (ref 0.7–4.0)
MCH: 29.4 pg (ref 26.0–34.0)
MCHC: 34.2 g/dL (ref 30.0–36.0)
MCV: 86 fL (ref 80.0–100.0)
Monocytes Absolute: 0.6 10*3/uL (ref 0.1–1.0)
Monocytes Relative: 8 %
Neutro Abs: 4.7 10*3/uL (ref 1.7–7.7)
Neutrophils Relative %: 66 %
Platelets: 188 10*3/uL (ref 150–400)
RBC: 5.34 MIL/uL (ref 4.22–5.81)
RDW: 14.4 % (ref 11.5–15.5)
WBC: 7.1 10*3/uL (ref 4.0–10.5)
nRBC: 0 % (ref 0.0–0.2)

## 2019-02-25 LAB — COMPREHENSIVE METABOLIC PANEL
ALT: 30 U/L (ref 0–44)
AST: 28 U/L (ref 15–41)
Albumin: 4.4 g/dL (ref 3.5–5.0)
Alkaline Phosphatase: 76 U/L (ref 38–126)
Anion gap: 9 (ref 5–15)
BUN: 18 mg/dL (ref 6–20)
CO2: 21 mmol/L — ABNORMAL LOW (ref 22–32)
Calcium: 9.3 mg/dL (ref 8.9–10.3)
Chloride: 107 mmol/L (ref 98–111)
Creatinine, Ser: 1.16 mg/dL (ref 0.61–1.24)
GFR calc Af Amer: >60 mL/min (ref >60)
GFR calc non Af Amer: >60 mL/min (ref >60)
Glucose, Bld: 101 mg/dL — ABNORMAL HIGH (ref 70–99)
Potassium: 4 mmol/L (ref 3.5–5.1)
Sodium: 137 mmol/L (ref 135–145)
Total Bilirubin: 0.7 mg/dL (ref 0.3–1.2)
Total Protein: 8.1 g/dL (ref 6.5–8.1)

## 2019-02-25 LAB — TROPONIN I (HIGH SENSITIVITY): Troponin I (High Sensitivity): 2 ng/L (ref <18)

## 2019-02-25 LAB — SARS CORONAVIRUS 2 AG (30 MIN TAT): SARS Coronavirus 2 Ag: NEGATIVE

## 2019-02-25 MED ORDER — PREDNISONE 20 MG PO TABS: 40.0000 mg | ORAL_TABLET | Freq: Every day | ORAL | 0 refills | Status: AC

## 2019-02-25 MED ORDER — ALBUTEROL SULFATE HFA 108 (90 BASE) MCG/ACT IN AERS: 2.0000 | INHALATION_SPRAY | RESPIRATORY_TRACT | 0 refills | Status: DC | PRN

## 2019-02-25 MED FILL — predniSONE 20 MG TABS: 20 | 5 days supply | Qty: 10 | Fill #0

## 2019-02-25 NOTE — ED Provider Notes (Signed)
[facility] EMERGENCY DEPARTMENT Provider Note   CSN: [id number] Arrival date & time: 02/25/19  1313     History Chief Complaint  Patient presents with  . Shortness of Breath    [name] is a 53 y.o. male.  53 y.o male with a PMH of HIV currently on ART therapy presents to the ED with a chief complaint of infection x2 weeks.  Patient reports he is currently been working as a Psychologist, educational, remodeling houses, taking care of insulation, reports since then has been very hard for him to breathe.  Reports is harder for him to do so especially at night.  He states he feels like his chest hurts just like when he had pneumonia a year ago.  Also endorses some left-sided pain that radiates into his left shoulder.  He has been using his inhaler DuoNebs, although reports he has been using this more frequently without any relief in his symptoms.  He reports contact his infectious disease physician who recommended he be evaluated in the ED. also reports some chills, unknown whether he has had a fever or not.  He also endorses a productive cough with some green to yellow sputum.  He does have a prior history of PE, not on any anticoagulation at this time.  He denies any prior history of heart failure, no recent sick contacts, no prior history of CAD.  The history is provided by the patient and medical records.  Shortness of Breath Associated symptoms: chest pain   Associated symptoms: no abdominal pain, no cough, no ear pain, no fever, no rash, no sore throat and no vomiting        Past Medical History:  Diagnosis Date  . Anxiety   . Arthritis   . Asthma   . Depression   . History of Clostridium difficile colitis 04/22/2017  . History of pulmonary embolus (PE) 03/17/2013   Overview:  post hip surgery, on blood thinners at the time  . HIV (human immunodeficiency virus infection) (HCC)   . Hypokalemia   . Hypotension 01/25/2015  . Immune deficiency disorder (HCC)   .  Infectious folliculitis 01/30/2017  . MRSA (methicillin resistant staph aureus) culture positive   . Sepsis (HCC)   . Streptococcal pneumonia [facility])     Patient Active Problem List   Diagnosis Date Noted  . Generalized anxiety disorder with panic attacks 10/25/2018  . Anxiety about health 10/25/2018  . Healthcare maintenance 07/06/2018  . Numbness of right lower extremity 07/06/2018  . Heartburn 07/06/2018  . 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 S/P hip replacement 02/09/2018  . Essential hypertension   . Lung abscess (HCC) 06/21/2017  . Lung nodule, multiple 06/14/2017  . Asthma 04/22/2017  . Folliculitis 01/30/2017  . Complication of implanted device 07/24/2015  . 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 GERD (gastroesophageal reflux disease) 01/25/2015  . HIV (human immunodeficiency virus infection) (HCC) 01/24/2015  . Primary osteoarthritis of both hips 03/29/2013  . 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 TOTAL HIP ARTHROPLASTY Right 02/09/2018   Procedure: TOTAL HIP ARTHROPLASTY ANTERIOR APPROACH;  Surgeon: Durene Romans, MD;  Location: [facility] ORS;  Service: Orthopedics;  Laterality: Right;  70 mins  Family History  Problem Relation Age of Onset  . Cancer Mother   . CAD Brother     Social History   Tobacco Use  . Smoking status: Former Smoker    Types: Cigarettes  . Smokeless tobacco: Current User    Types: Chew  Substance Use Topics  . Alcohol use: Yes    Comment: seldom  . Drug use: No    Home Medications Prior to Admission medications   Medication Sig Start Date End Date Taking? Authorizing Provider  acetaminophen (TYLENOL) 500 MG tablet Take 2 tablets (1,000 mg total) by mouth  every 8 (eight) hours. Patient not taking: Reported on 08/05/2018 02/10/18   Lanney GinsBabish, Matthew, PA-C  albuterol (VENTOLIN HFA) 108 (90 Base) MCG/ACT inhaler Inhale 2 puffs into the lungs every 4 (four) hours as needed for wheezing or shortness of breath. 02/25/19   Claude MangesSoto, Dimples Probus, PA-C  benzonatate (TESSALON) 100 MG capsule Take 1 capsule (100 mg total) by mouth 3 (three) times daily as needed for cough. Patient not taking: Reported on 08/05/2018 05/20/18   Robinson, [place]azil[place] N, PA-C  Darunavir-Cobicisctat-Emtricitabine-Tenofovir Alafenamide [facility](SYMTUZA) 800-150-200-10 MG TABS Take 1 tablet by mouth daily with breakfast. 07/06/18   Veryl Speakalone, Gregory D, FNP  fluconazole (DIFLUCAN) 200 MG tablet TAKE 1 TABLET BY MOUTH ONCE DAILY 08/25/18   Veryl Speakalone, Gregory D, FNP  gabapentin (NEURONTIN) 600 MG tablet TAKE 2 TABLET BY MOUTH THREE TIMES DAILY 01/31/19   Veryl Speakalone, Gregory D, FNP  meloxicam (MOBIC) 15 MG tablet Take 1 tablet (15 mg total) by mouth daily. 08/31/18   Raeford RazorKohut, Stephen, MD  methocarbamol (ROBAXIN) 500 MG tablet Take 1 tablet (500 mg total) by mouth every 6 (six) hours as needed for muscle spasms. Patient not taking: Reported on 08/05/2018 02/10/18   Lanney GinsBabish, Matthew, PA-C  Multiple Vitamins-Minerals (MENS MULTIVITAMIN PLUS PO) Take 1 tablet by mouth 2 (two) times daily.    [provider]  omeprazole (PRILOSEC) 20 MG capsule Take 1 capsule (20 mg total) by mouth daily. 07/06/18   Veryl Speakalone, Gregory D, FNP  predniSONE (DELTASONE) 20 MG tablet Take 2 tablets (40 mg total) by mouth daily for 5 days. 02/25/19 03/02/19  Claude MangesSoto, Dossie Ocanas, PA-C  Prenatal Vit-Fe Fumarate-FA (PRENATAL MULTIVITAMIN) TABS tablet Take 1 tablet by mouth 2 (two) times daily.     [provider]  TIVICAY 50 MG tablet TAKE 1 TABLET BY MOUTH ONCE DAILY 08/25/18   Veryl Speakalone, Gregory D, FNP  valACYclovir (VALTREX) 500 MG tablet TAKE 1 TABLET(500 MG) BY MOUTH DAILY 08/16/18   Daiva EvesVan Dam, Lisette Grinderornelius N, MD    Allergies    Hydrocodone, Sulfa  antibiotics, and Tramadol  Review of Systems   Review of Systems  Constitutional: Negative for chills and fever.  HENT: Negative for ear pain and sore throat.   Eyes: Negative for pain and visual disturbance.  Respiratory: Positive for choking and shortness of breath. Negative for cough.   Cardiovascular: Positive for chest pain. Negative for palpitations.  Gastrointestinal: Negative for abdominal pain and vomiting.  Genitourinary: Negative for dysuria and hematuria.  Musculoskeletal: Negative for arthralgias and back pain.  Skin: Negative for color change and rash.  Neurological: Negative for seizures and syncope.  All other systems reviewed and are negative.   Physical Exam Updated Vital Signs BP (!) 161/96   Pulse 70   Temp 98.7 F (37.1 C) (Oral)   Resp 18   Ht 6\' 2"  (1.88 m)   Wt 108.9 kg   SpO2 98%   BMI 30.81 kg/m  Physical Exam Vitals and nursing note reviewed.  Constitutional:      Appearance: He is well-developed.  HENT:     Head: Normocephalic and atraumatic.  Eyes:     General: No scleral icterus.    Pupils: Pupils are equal, round, and reactive to light.  Cardiovascular:     Rate and Rhythm: Normal rate.     Heart sounds: Normal heart sounds.     Comments: Bilateral pitting edema.  No calf tenderness. Pulmonary:     Effort: Pulmonary effort is normal.     Breath sounds: Examination of the right-middle field reveals decreased breath sounds. Examination of the right-lower field reveals decreased breath sounds. Examination of the left-lower field reveals decreased breath sounds. Decreased breath sounds present. No wheezing.  Chest:     Chest wall: No tenderness.  Abdominal:     General: Bowel sounds are normal. There is no distension.     Palpations: Abdomen is soft.     Tenderness: There is no abdominal tenderness.  Musculoskeletal:        General: No tenderness or deformity.     Cervical back: Normal range of motion.  Skin:    General: Skin is warm  and dry.  Neurological:     Mental Status: He is alert and oriented to person, place, and time.     ED Results / Procedures / Treatments   Labs (all labs ordered are listed, but only abnormal results are displayed) Labs Reviewed  COMPREHENSIVE METABOLIC PANEL - Abnormal; Notable for the following components:      Result Value   CO2 21 (*)    Glucose, Bld 101 (*)    All other components within normal limits  SARS CORONAVIRUS 2 AG (30 MIN TAT)  CBC WITH DIFFERENTIAL/PLATELET  TROPONIN I (HIGH SENSITIVITY)  TROPONIN I (HIGH SENSITIVITY)    EKG EKG Interpretation  Date/Time:  Friday February 25 2019 13:22:19 EST Ventricular Rate:  76 PR Interval:  160 QRS Duration: 84 QT Interval:  398 QTC Calculation: 447 R Axis:   7 Text Interpretation: Normal sinus rhythm Normal ECG No significant change since last tracing Confirmed by Jacalyn Lefevre [phone]) on 02/25/2019 1:31:25 PM   Radiology DG Chest Portable 1 View  Result Date: 02/25/2019 CLINICAL DATA:  Shortness of breath and chest pain EXAM: PORTABLE CHEST 1 VIEW COMPARISON:  November 06, 2018 FINDINGS: The heart size and mediastinal contours are within normal limits. Both lungs are clear. The visualized skeletal structures are unremarkable. IMPRESSION: No active disease. Electronically Signed   By: Sherian Rein M.D.   On: 02/25/2019 14:10    Procedures Procedures (including critical care time)  Medications Ordered in ED Medications - No data to display  ED Course  I have reviewed the triage vital signs and the nursing notes.  Pertinent labs & imaging results that were available during my care of the patient were reviewed by me and considered in my medical decision making (see chart for details).    MDM Rules/Calculators/A&P      Patient with a past medical history of HIV currently on ART presents to the ED with complaints of shortness of breath for the past 2 weeks.  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  The shortness of breath is worse at night.  Patient reports he also has some left-sided pain that radiates into his shoulder, he has  been using his inhaler however this has not relieved his symptoms.  He was sent here by his infectious disease physician as he currently does not have a primary care.  He also endorses a cough with green sputum, has not been running any fevers, has had some chills. During my evaluation lungs are slightly diminished, he is otherwise oxygenating well, stats are 98% on Room Air without Any Tachypnea or Tachycardia.  BMP Showed No Electrolyte Derangement, Creatinine Level Is within Normal Limits, LFTs Are Unremarkable.  CBC without Any Leukocytosis.  Covid Test Is Negative.  Troponin Is 2, I Feel That This Is Less Likely to Be ACS As Patient Is Complaining More of URI Complaints.  EKG without any signs of infarct or STEMI.   Patient has ambulated with a steady gait, oxygen level is 97%, without any tachypnea, tachycardia.  We discussed the risks and benefits of obtaining CT angios to rule out pulmonary embolism at this time however patient reports he has to pick up his granddaughter at this time.  He does have an appointment scheduled with a new PCP on Monday, he is requesting a refill on his inhaler.  I have written a prescription for his inhaler along with a short course of steroids to help with his breathing.  Lower suspicion for pulmonary embolism, is not in any acute distress at this time.  Strict return precautions provided at length.   Portions of this note were generated with Lobbyist. Dictation errors may occur despite best attempts at proofreading.  Final Clinical Impression(s) / ED Diagnoses Final diagnoses:  SOB (shortness of breath)    Rx / DC Orders ED Discharge Orders         Ordered    albuterol (VENTOLIN HFA) 108 (90 Base)  MCG/ACT inhaler  Every 4 hours PRN     02/25/19 1615    predniSONE (DELTASONE) 20 MG tablet  Daily     02/25/19 1615           Janeece Fitting, PA-C 02/25/19 1617    Isla Pence, MD 02/26/19 [phone]

## 2019-02-25 NOTE — Discharge Instructions (Signed)
Your chest x-ray today was negative.  Your laboratory results were within normal limits.  I provided a refill for your inhaler, there is also a prescription for a short course of steroids to help with your breathing, please take 2 tablets daily for the next 5 days.  Please follow-up with your new primary care physician on Monday.  If you experience any worsening symptoms, chest pain, shortness of breath please return to the emergency department.

## 2019-02-25 NOTE — ED Triage Notes (Signed)
Sob, chest pain, cough, shoulder pain x 2 weeks. States he had the same symptoms in August when he was positive for Covid.

## 2019-02-25 NOTE — ED Notes (Signed)
PT ambulated with steady gait and with no assistance. PTs O2 stayed above 97% through ambulation.

## 2019-02-25 NOTE — ED Notes (Signed)
ED Provider at bedside. 

## 2019-02-26 ENCOUNTER — Other Ambulatory Visit: Payer: Self-pay | Admitting: Family

## 2019-02-26 DIAGNOSIS — B20 Human immunodeficiency virus [HIV] disease: Secondary | ICD-10-CM

## 2019-03-03 NOTE — Telephone Encounter (Signed)
Opened in ERROR

## 2019-03-22 ENCOUNTER — Other Ambulatory Visit: Payer: Self-pay | Admitting: Family

## 2019-03-22 DIAGNOSIS — B20 Human immunodeficiency virus [HIV] disease: Secondary | ICD-10-CM

## 2019-03-29 ENCOUNTER — Other Ambulatory Visit: Payer: Self-pay | Admitting: Family

## 2019-03-29 DIAGNOSIS — R12 Heartburn: Secondary | ICD-10-CM

## 2019-04-02 ENCOUNTER — Other Ambulatory Visit: Payer: Self-pay | Admitting: Family

## 2019-04-11 ENCOUNTER — Other Ambulatory Visit: Payer: Self-pay

## 2019-04-11 ENCOUNTER — Ambulatory Visit (INDEPENDENT_AMBULATORY_CARE_PROVIDER_SITE_OTHER): Payer: Medicare Other | Admitting: Family

## 2019-04-11 ENCOUNTER — Encounter: Payer: Self-pay | Admitting: Family

## 2019-04-11 VITALS — BP 140/94 | HR 62 | Temp 97.9°F | Resp 12 | Ht 74.0 in | Wt 237.0 lb

## 2019-04-11 DIAGNOSIS — Z79899 Other long term (current) drug therapy: Secondary | ICD-10-CM | POA: Diagnosis not present

## 2019-04-11 DIAGNOSIS — Z0000 Encounter for general adult medical examination without abnormal findings: Secondary | ICD-10-CM | POA: Diagnosis not present

## 2019-04-11 DIAGNOSIS — F410 Panic disorder [episodic paroxysmal anxiety] without agoraphobia: Secondary | ICD-10-CM

## 2019-04-11 DIAGNOSIS — K2100 Gastro-esophageal reflux disease with esophagitis, without bleeding: Secondary | ICD-10-CM

## 2019-04-11 DIAGNOSIS — R200 Anesthesia of skin: Secondary | ICD-10-CM | POA: Diagnosis not present

## 2019-04-11 DIAGNOSIS — F411 Generalized anxiety disorder: Secondary | ICD-10-CM

## 2019-04-11 DIAGNOSIS — N529 Male erectile dysfunction, unspecified: Secondary | ICD-10-CM | POA: Diagnosis not present

## 2019-04-11 DIAGNOSIS — B20 Human immunodeficiency virus [HIV] disease: Secondary | ICD-10-CM | POA: Diagnosis not present

## 2019-04-11 DIAGNOSIS — Z113 Encounter for screening for infections with a predominantly sexual mode of transmission: Secondary | ICD-10-CM

## 2019-04-11 DIAGNOSIS — Z21 Asymptomatic human immunodeficiency virus [HIV] infection status: Secondary | ICD-10-CM

## 2019-04-11 DIAGNOSIS — R12 Heartburn: Secondary | ICD-10-CM | POA: Diagnosis not present

## 2019-04-11 MED ORDER — OMEPRAZOLE 20 MG PO CPDR: 20.0000 mg | DELAYED_RELEASE_CAPSULE | Freq: Every day | ORAL | 1 refills | Status: DC

## 2019-04-11 MED ORDER — CITALOPRAM HYDROBROMIDE 10 MG PO TABS: 10.0000 mg | ORAL_TABLET | Freq: Every day | ORAL | 2 refills | Status: DC

## 2019-04-11 MED ORDER — SYMTUZA 800-150-200-10 MG PO TABS: 1.0000 | ORAL_TABLET | Freq: Every day | ORAL | 5 refills | Status: DC

## 2019-04-11 MED ORDER — SILDENAFIL CITRATE 50 MG PO TABS: 25.0000 mg | ORAL_TABLET | Freq: Every day | ORAL | 0 refills | Status: DC | PRN

## 2019-04-11 MED ORDER — GABAPENTIN 600 MG PO TABS: 1200.0000 mg | ORAL_TABLET | Freq: Three times a day (TID) | ORAL | 1 refills | Status: DC

## 2019-04-11 MED ORDER — TIVICAY 50 MG PO TABS: 50.0000 mg | ORAL_TABLET | Freq: Every day | ORAL | 5 refills | Status: DC

## 2019-04-11 NOTE — Assessment & Plan Note (Signed)
Neuropathy of lower extremity adequately controlled with current dose of gabapentin.  No adverse side effects noted at present.  Continue current dose of gabapentin.

## 2019-04-11 NOTE — Assessment & Plan Note (Signed)
Mr. Ontiveros continues to have anxiety albeit much improved since his last office visit and since having coronavirus.  He is interested in starting medication for anxiety and we discussed starting him on Celexa which he is in agreement with.  Recommend counseling if symptoms do not continue to improve.  Plan for follow-up in 1 month or sooner if needed.

## 2019-04-11 NOTE — Assessment & Plan Note (Signed)
   Influenza vaccination updated today.  Discussed importance of safe sexual practice to reduce risk of STI.

## 2019-04-11 NOTE — Assessment & Plan Note (Signed)
[name] has generally well-controlled HIV disease with good adherence and tolerance to his ART regimen of Symtuza and Tivicay.  He has no signs/symptoms of opportunistic infection or progressive HIV disease.  We discussed the plan of care and will continue Symtuza and Tivicay.  Check blood work today.  Plan for follow-up in 3 months or sooner if needed with lab work 1 to 2 weeks prior to appointment or on same day.

## 2019-04-11 NOTE — Progress Notes (Signed)
Subjective:    Patient ID: [name], male    DOB: 14-Dec-1965, 54 y.o.   MRN: [id number]  Chief Complaint  Patient presents with  . Follow-up  . Anxiety    pt requesting for anxiety medication.     HPI:  [name] is a 54 y.o. male with HIV disease was last seen in the office on 10/25/2018 with generalized anxiety disorder/panic attacks secondary to the coronavirus pandemic.  He had good adherence and tolerance to his ART regimen of Symtuza and Tivicay at the time and was unable to obtain blood work to check his current status.  [name] continues to take his [place] as prescribed with no adverse side effects or missed doses since his last office visit.  He reports that he has had coronavirus since that time with minimal symptoms and is feeling much better now.  He does continue to have fairly significant anxiety at times and his wife is noted mood changes recently.  Overall he feels much better. Denies fevers, chills, night sweats, headaches, changes in vision, neck pain/stiffness, nausea, diarrhea, vomiting, lesions or rashes.  [name] has no problems obtaining his medication from the pharmacy and is in need of refills.  Denies feelings of being down, depressed, or hopeless recently other than the anxiety described above.  No recreational or illicit drug use, tobacco use, or alcohol consumption presently.  He is not currently sexually active as he has been experiencing erectile dysfunction which she is interested in obtaining medication for.   Allergies  Allergen Reactions  . Hydrocodone Other (See Comments)    "makes me overly sleepy"  . Sulfa Antibiotics Rash  . Tramadol Nausea Only      Outpatient Medications Prior to Visit  Medication Sig Dispense Refill  . fluconazole (DIFLUCAN) 200 MG tablet TAKE 1 TABLET BY MOUTH ONCE DAILY 7 tablet 4  . valACYclovir (VALTREX) 500 MG tablet TAKE 1 TABLET(500 MG) BY MOUTH DAILY 30 tablet 11  . VENTOLIN HFA  108 (90 Base) MCG/ACT inhaler INHALE 2 PUFFS INTO THE LUNGS EVERY 6 HOURS AS NEEDED FOR WHEEZING OR SHORTNESS OF BREATH 18 g 1  . Darunavir-Cobicisctat-Emtricitabine-Tenofovir Alafenamide (SYMTUZA) 800-150-200-10 MG TABS Take 1 tablet by mouth daily with breakfast. 30 tablet 5  . gabapentin (NEURONTIN) 600 MG tablet TAKE 2 TABLETS BY MOUTH THREE TIMES DAILY 180 tablet 1  . meloxicam (MOBIC) 15 MG tablet Take 1 tablet (15 mg total) by mouth daily. 10 tablet 0  . Multiple Vitamins-Minerals (MENS MULTIVITAMIN PLUS PO) Take 1 tablet by mouth 2 (two) times daily.    Marland Kitchen omeprazole (PRILOSEC) 20 MG capsule Take 1 capsule (20 mg total) by mouth daily. 30 capsule 5  . Prenatal Vit-Fe Fumarate-FA (PRENATAL MULTIVITAMIN) TABS tablet Take 1 tablet by mouth 2 (two) times daily.     Marland Kitchen TIVICAY 50 MG tablet TAKE 1 TABLET BY MOUTH ONCE DAILY 30 tablet 4  . acetaminophen (TYLENOL) 500 MG tablet Take 2 tablets (1,000 mg total) by mouth every 8 (eight) hours. (Patient not taking: Reported on 08/05/2018) 30 tablet 0  . benzonatate (TESSALON) 100 MG capsule Take 1 capsule (100 mg total) by mouth 3 (three) times daily as needed for cough. (Patient not taking: Reported on 08/05/2018) 21 capsule 0  . methocarbamol (ROBAXIN) 500 MG tablet Take 1 tablet (500 mg total) by mouth every 6 (six) hours as needed for muscle spasms. (Patient not taking: Reported on 08/05/2018) 40 tablet 0   No facility-administered  medications prior to visit.     Past Medical History:  Diagnosis Date  . Anxiety   . Arthritis   . Asthma   . Depression   . History of Clostridium difficile colitis 04/22/2017  . History of pulmonary embolus (PE) 03/17/2013   Overview:  post hip surgery, on blood thinners at the time  . HIV (human immunodeficiency virus infection) (HCC)   . Hypokalemia   . Hypotension 01/25/2015  . Immune deficiency disorder (HCC)   . Infectious folliculitis 01/30/2017  . MRSA (methicillin resistant staph aureus) culture positive     . Sepsis (HCC)   . Streptococcal pneumonia [facility])      Past Surgical History:  Procedure Laterality Date  . 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 TOTAL HIP ARTHROPLASTY Right 02/09/2018   Procedure: TOTAL HIP ARTHROPLASTY ANTERIOR APPROACH;  Surgeon: Durene Romans, MD;  Location: [facility];  Service: Orthopedics;  Laterality: Right;  70 mins       Review of Systems  Constitutional: Negative for chills, diaphoresis, fatigue and fever.  Respiratory: Negative for cough, chest tightness, shortness of breath and wheezing.   Cardiovascular: Negative for chest pain.  Gastrointestinal: Negative for abdominal pain, diarrhea, nausea and vomiting.  Psychiatric/Behavioral: Negative for self-injury and suicidal ideas. The patient is nervous/anxious.       Objective:    BP (!) 140/94   Pulse 62   Temp 97.9 F (36.6 C)   Resp 12   Ht 6\' 2"  (1.88 m)   Wt 237 lb (107.5 kg)   SpO2 96%   BMI 30.43 kg/m  Nursing note and vital signs reviewed.  Physical Exam Constitutional:      General: He is not in acute distress.    Appearance: He is well-developed.     Comments: Seated on the table; anxious  Cardiovascular:     Rate and Rhythm: Normal rate and regular rhythm.     Heart sounds: Normal heart sounds.  Pulmonary:     Effort: Pulmonary effort is normal.     Breath sounds: Normal breath sounds.  Skin:    General: Skin is warm and dry.  Neurological:     Mental Status: He is alert and oriented to person, place, and time.  Psychiatric:        Behavior: Behavior normal.        Thought Content: Thought content normal.        Judgment: Judgment normal.      Depression screen [facility] 2/9 04/05/2018 02/08/2018 05/04/2017  Decreased Interest 0 0 0  Down, Depressed, Hopeless 0 0 0  PHQ - 2 Score 0 0 0       Assessment & Plan:    Patient Active Problem List   Diagnosis Date Noted  . HIV (human immunodeficiency virus  infection) (HCC) 01/24/2015    Priority: High  . Erectile dysfunction 04/11/2019  . Generalized anxiety disorder with panic attacks 10/25/2018  . Anxiety about health 10/25/2018  . Healthcare maintenance 07/06/2018  . Numbness of right lower extremity 07/06/2018  . Heartburn 07/06/2018  . 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 S/P hip replacement 02/09/2018  . Essential hypertension   . Lung abscess (HCC) 06/21/2017  . Lung nodule, multiple 06/14/2017  . Asthma 04/22/2017  . Folliculitis 01/30/2017  . Complication of implanted device 07/24/2015  . 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 Severe episode of recurrent major depressive disorder, without psychotic features (  HCC) 04/27/2015  . GERD (gastroesophageal reflux disease) 01/25/2015  . Primary osteoarthritis of both hips 03/29/2013  . 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  Refill omeprazole.      Relevant Medications   omeprazole (PRILOSEC) 20 MG capsule     Other   HIV (human immunodeficiency virus infection) [facility])    [name] has generally well-controlled HIV disease with good adherence and tolerance to his ART regimen of Symtuza and Tivicay.  He has no signs/symptoms of opportunistic infection or progressive HIV disease.  We discussed the plan of care and will continue Symtuza and Tivicay.  Check blood work today.  Plan for follow-up in 3 months or sooner if needed with lab work 1 to 2 weeks prior to appointment or on same day.      Relevant Medications   Darunavir-Cobicisctat-Emtricitabine-Tenofovir Alafenamide (SYMTUZA) 800-150-200-10 MG TABS   dolutegravir (TIVICAY) 50 MG tablet   gabapentin (NEURONTIN) 600 MG tablet   Other Relevant Orders   CBC   COMPLETE METABOLIC PANEL WITH GFR   HIV-1 RNA quant-no reflex-bld    T-helper cell (CD4)- ([facility] clinic only)   Healthcare maintenance     Influenza vaccination updated today.  Discussed importance of safe sexual practice to reduce risk of STI.      Numbness of right lower extremity    Neuropathy of lower extremity adequately controlled with current dose of gabapentin.  No adverse side effects noted at present.  Continue current dose of gabapentin.      Heartburn   Relevant Medications   omeprazole (PRILOSEC) 20 MG capsule   Generalized anxiety disorder with panic attacks    [name] continues to have anxiety albeit much improved since his last office visit and since having coronavirus.  He is interested in starting medication for anxiety and we discussed starting him on Celexa which he is in agreement with.  Recommend counseling if symptoms do not continue to improve.  Plan for follow-up in 1 month or sooner if needed.      Relevant Medications   citalopram (CELEXA) 10 MG tablet   Erectile dysfunction    [name] has been experiencing symptoms of erectile dysfunction most likely multifactorial in nature based on chronic comorbid conditions as well as psychological anxiety.  Discussed treatment options and will start sildenafil at low-dose given reaction with cobicistat.  Advised to seek emergency care for priapism occurs and not to take medication with nitroglycerin for chest pain.       Other Visit Diagnoses    Screening for STDs (sexually transmitted diseases)    -  Primary   Relevant Orders   RPR   Pharmacologic therapy       Relevant Orders   Lipid panel       I have discontinued [name]'s prenatal multivitamin, Multiple Vitamins-Minerals (MENS MULTIVITAMIN PLUS PO), and meloxicam. I have also changed his Tivicay and gabapentin. Additionally, I am having him start on citalopram and sildenafil. Lastly, I am having him maintain his methocarbamol, acetaminophen, benzonatate, valACYclovir, fluconazole, Ventolin HFA, Symtuza, and  omeprazole.   Meds ordered this encounter  Medications  . Darunavir-Cobicisctat-Emtricitabine-Tenofovir Alafenamide (SYMTUZA) 800-150-200-10 MG TABS    Sig: Take 1 tablet by mouth daily with breakfast.    Dispense:  30 tablet  Refill:  5    Order Specific Question:   Supervising Provider    Answer:   Judyann Munson [[id number]]  . dolutegravir (TIVICAY) 50 MG tablet    Sig: Take 1 tablet (50 mg total) by mouth daily.    Dispense:  30 tablet    Refill:  5    Order Specific Question:   Supervising Provider    Answer:   Judyann Munson [[id number]]  . citalopram (CELEXA) 10 MG tablet    Sig: Take 1 tablet (10 mg total) by mouth daily.    Dispense:  30 tablet    Refill:  2    Order Specific Question:   Supervising Provider    Answer:   Judyann Munson [[id number]]  . gabapentin (NEURONTIN) 600 MG tablet    Sig: Take 2 tablets (1,200 mg total) by mouth 3 (three) times daily.    Dispense:  180 tablet    Refill:  1    Order Specific Question:   Supervising Provider    Answer:   Judyann Munson [[id number]]  . omeprazole (PRILOSEC) 20 MG capsule    Sig: Take 1 capsule (20 mg total) by mouth daily.    Dispense:  90 capsule    Refill:  1    Order Specific Question:   Supervising Provider    Answer:   Judyann Munson [[id number]]  . sildenafil (VIAGRA) 50 MG tablet    Sig: Take 0.5 tablets (25 mg total) by mouth daily as needed for erectile dysfunction.    Dispense:  10 tablet    Refill:  0    Order Specific Question:   Supervising Provider    Answer:   Judyann Munson [[id number]]     Follow-up: Return in about 1 month (around 05/12/2019), or if symptoms worsen or fail to improve.   Marcos Eke, MSN, FNP-C Nurse Practitioner [facility] Main number: [phone]

## 2019-04-11 NOTE — Patient Instructions (Signed)
Nice to see you!  Continue to take your Symtuza and Tivicay daily.  We will check your blood work today.  Please start taking the Celexa daily.  Plan for follow up in 1 month or sooner if needed to follow up on your anxiety.  Have a great day and stay safe!

## 2019-04-11 NOTE — Assessment & Plan Note (Signed)
[name] has been experiencing symptoms of erectile dysfunction most likely multifactorial in nature based on chronic comorbid conditions as well as psychological anxiety.  Discussed treatment options and will start sildenafil at low-dose given reaction with cobicistat.  Advised to seek emergency care for priapism occurs and not to take medication with nitroglycerin for chest pain.

## 2019-04-11 NOTE — Assessment & Plan Note (Signed)
Gastroesophageal reflux well controlled with current dose of omeprazole.  Refill omeprazole.

## 2019-04-12 ENCOUNTER — Telehealth: Payer: Self-pay

## 2019-04-12 ENCOUNTER — Other Ambulatory Visit: Payer: Self-pay

## 2019-04-12 ENCOUNTER — Other Ambulatory Visit: Payer: Self-pay | Admitting: Family

## 2019-04-12 LAB — T-HELPER CELL (CD4) - (RCID CLINIC ONLY)
CD4 % Helper T Cell: 19 % — ABNORMAL LOW (ref 33–65)
CD4 T Cell Abs: 361 /uL — ABNORMAL LOW (ref 400–1790)

## 2019-04-12 MED ORDER — ALBUTEROL SULFATE HFA 108 (90 BASE) MCG/ACT IN AERS: INHALATION_SPRAY | RESPIRATORY_TRACT | 1 refills | Status: DC

## 2019-04-12 NOTE — Telephone Encounter (Signed)
Received fax from pharmacy Sildenafil not covered by members plan. Routing yo Marcos Eke, NP for advise. Valarie Cones

## 2019-04-12 NOTE — Telephone Encounter (Signed)
Patient provided

## 2019-04-14 LAB — CBC
HCT: 45.4 % (ref 38.5–50.0)
Hemoglobin: 15.6 g/dL (ref 13.2–17.1)
MCH: 29.9 pg (ref 27.0–33.0)
MCHC: 34.4 g/dL (ref 32.0–36.0)
MCV: 87 fL (ref 80.0–100.0)
MPV: 10.4 fL (ref 7.5–12.5)
Platelets: 165 10*3/uL (ref 140–400)
RBC: 5.22 10*6/uL (ref 4.20–5.80)
RDW: 14.5 % (ref 11.0–15.0)
WBC: 6 10*3/uL (ref 3.8–10.8)

## 2019-04-14 LAB — LIPID PANEL
Cholesterol: 221 mg/dL — ABNORMAL HIGH (ref <200)
HDL: 44 mg/dL (ref >=40)
LDL Cholesterol (Calc): 125 mg/dL (calc) — ABNORMAL HIGH
Non-HDL Cholesterol (Calc): 177 mg/dL (calc) — ABNORMAL HIGH (ref <130)
Total CHOL/HDL Ratio: 5 (calc) — ABNORMAL HIGH (ref <5.0)
Triglycerides: 365 mg/dL — ABNORMAL HIGH (ref <150)

## 2019-04-14 LAB — COMPLETE METABOLIC PANEL WITH GFR
AG Ratio: 1.6 (calc) (ref 1.0–2.5)
ALT: 28 U/L (ref 9–46)
AST: 23 U/L (ref 10–35)
Albumin: 4.7 g/dL (ref 3.6–5.1)
Alkaline phosphatase (APISO): 68 U/L (ref 35–144)
BUN: 17 mg/dL (ref 7–25)
CO2: 24 mmol/L (ref 20–32)
Calcium: 9.6 mg/dL (ref 8.6–10.3)
Chloride: 104 mmol/L (ref 98–110)
Creat: 1.33 mg/dL (ref 0.70–1.33)
GFR, Est African American: 71 mL/min/{1.73_m2} (ref >=60)
GFR, Est Non African American: 61 mL/min/{1.73_m2} (ref >=60)
Globulin: 2.9 g/dL (calc) (ref 1.9–3.7)
Glucose, Bld: 89 mg/dL (ref 65–99)
Potassium: 4.4 mmol/L (ref 3.5–5.3)
Sodium: 137 mmol/L (ref 135–146)
Total Bilirubin: 0.4 mg/dL (ref 0.2–1.2)
Total Protein: 7.6 g/dL (ref 6.1–8.1)

## 2019-04-14 LAB — HIV-1 RNA QUANT-NO REFLEX-BLD
HIV 1 RNA Quant: 25 copies/mL — ABNORMAL HIGH
HIV-1 RNA Quant, Log: 1.4 Log copies/mL — ABNORMAL HIGH

## 2019-04-14 LAB — RPR: RPR Ser Ql: NONREACTIVE

## 2019-04-19 ENCOUNTER — Telehealth: Payer: Self-pay

## 2019-04-19 NOTE — Telephone Encounter (Signed)
Received fax from [facility] stating patient's sildenafil is not covered by insurance.  Attempted to call patient to inform him he would need to contact [facility] in order to obtain assistance for this. Left voicemail requesting patient call back. [facility]: [phone] Lorenso Courier, CMA

## 2019-05-11 ENCOUNTER — Telehealth: Payer: Self-pay

## 2019-05-11 NOTE — Telephone Encounter (Signed)
COVID-19 Pre-Screening Questions: 05/11/19  Do you currently have a fever (>100 F), chills or unexplained body aches? NO  Are you currently experiencing new cough, shortness of breath, sore throat, runny nose? NO .  Have you recently travelled outside the state of [place] in the last 14 days? NO .  Have you been in contact with someone that is currently pending confirmation of Covid19 testing or has been confirmed to have the Covid19 virus?  NO   **If the patient answers NO to ALL questions -  advise the patient to please call the clinic before coming to the office should any symptoms develop.     

## 2019-05-12 ENCOUNTER — Ambulatory Visit: Payer: Medicare Other | Admitting: Family

## 2019-05-13 ENCOUNTER — Ambulatory Visit: Payer: Medicare Other | Admitting: Family

## 2019-06-24 ENCOUNTER — Other Ambulatory Visit: Payer: Self-pay | Admitting: Family

## 2019-06-24 DIAGNOSIS — J45909 Unspecified asthma, uncomplicated: Secondary | ICD-10-CM

## 2019-08-04 ENCOUNTER — Telehealth: Payer: Self-pay | Admitting: *Deleted

## 2019-08-04 NOTE — Telephone Encounter (Signed)
Patient called front desk to schedule appointments, ask about Covid vaccine. Front desk transferred call to triage, as patient stated he was having difficulty breathing. Patient cannot complete a sentence without stopping.  RN asked him to use his rescue inhaler while on the phone (patient compressed inhaler 6 times for 1st inhalation, 11 times for second inhalation, 7 times for 3rd inhalation).  After this, he was able to speak without needing to stop to catch his breath. He states he just picked up 3 inhalers from the pharmacy this weekend, always carries them.  He will go to urgent care today after breakfast for assessment/treatment with steroids if appropriate (he declined the emergency room). He has not yet found primary care. He [facility]. He is asking for a new referral for pulmonology "to get this under control before the hot weather kicks in, making it worse." Providence Lanius, Zachary George, RN

## 2019-08-07 ENCOUNTER — Other Ambulatory Visit: Payer: Self-pay | Admitting: Family

## 2019-08-07 DIAGNOSIS — B20 Human immunodeficiency virus [HIV] disease: Secondary | ICD-10-CM

## 2019-08-16 ENCOUNTER — Other Ambulatory Visit: Payer: Self-pay

## 2019-08-16 DIAGNOSIS — Z21 Asymptomatic human immunodeficiency virus [HIV] infection status: Secondary | ICD-10-CM

## 2019-08-17 ENCOUNTER — Other Ambulatory Visit: Payer: Medicare Other

## 2019-08-30 ENCOUNTER — Encounter: Payer: Medicare Other | Admitting: Family

## 2019-09-01 ENCOUNTER — Other Ambulatory Visit: Payer: Self-pay | Admitting: Family

## 2019-09-01 DIAGNOSIS — B20 Human immunodeficiency virus [HIV] disease: Secondary | ICD-10-CM

## 2019-10-01 ENCOUNTER — Other Ambulatory Visit: Payer: Self-pay | Admitting: Family

## 2019-10-01 DIAGNOSIS — B20 Human immunodeficiency virus [HIV] disease: Secondary | ICD-10-CM

## 2019-10-24 ENCOUNTER — Ambulatory Visit: Payer: Medicare Other | Admitting: Family

## 2019-10-29 IMAGING — CT CT CHEST W/ CM
2 of 3 series · 15 of 36 positions shown, 18 images · IV contrast (omnipaque)
Comparison: CT scan June 14, 2017

CLINICAL DATA: Pneumonia.  HIV positive.

EXAM:
CT CHEST WITH CONTRAST
TECHNIQUE: Multidetector CT imaging of the chest was performed during
intravenous contrast administration.
CONTRAST:  75mL OMNIPAQUE IOHEXOL 300 MG/ML  SOLN

[Series 2: axial st · axial · 0.72mm/px · z∈[+1138,+1406]mm · 12 of 158 slices shown, 15 images]
[im 12/158  mediastinal]
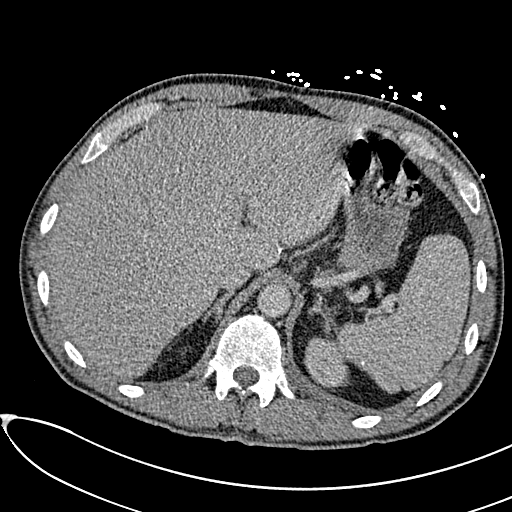
[im 12/158  lung]
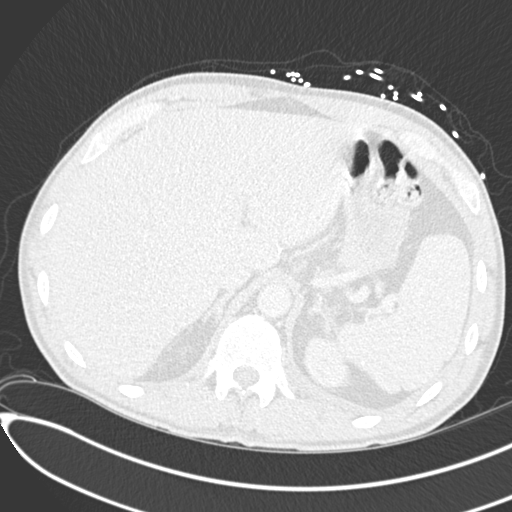
[im 24/158  lung]
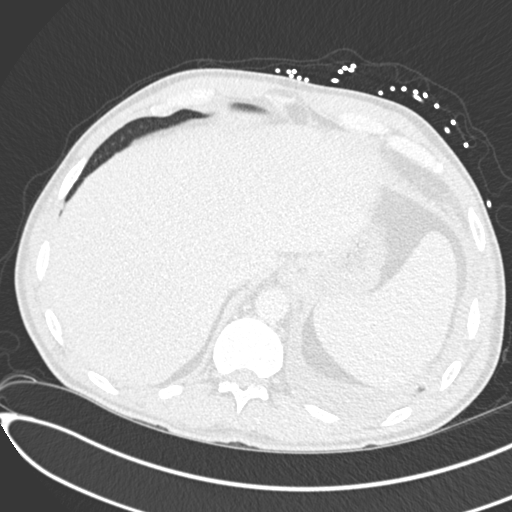
[im 35/158  lung]
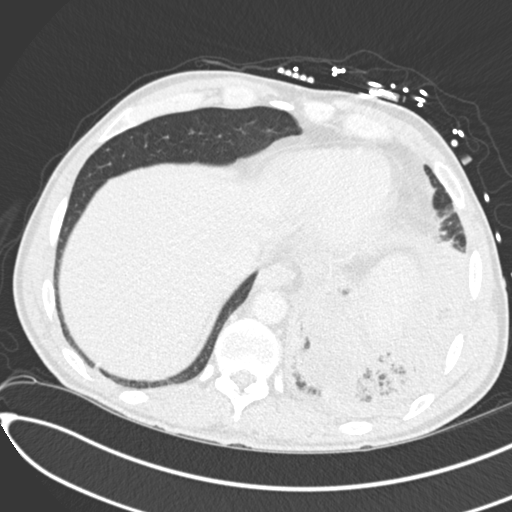
[im 47/158  lung]
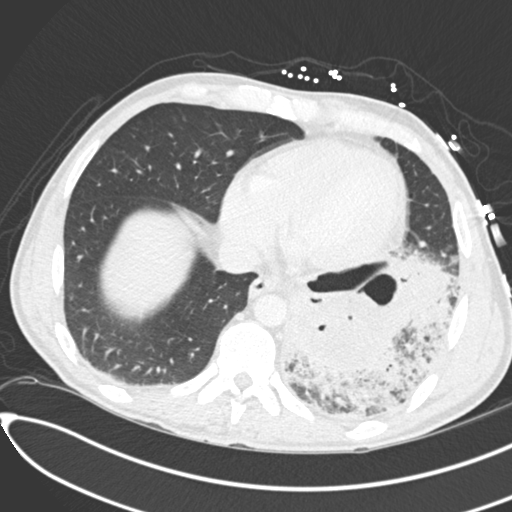
[im 59/158  mediastinal]
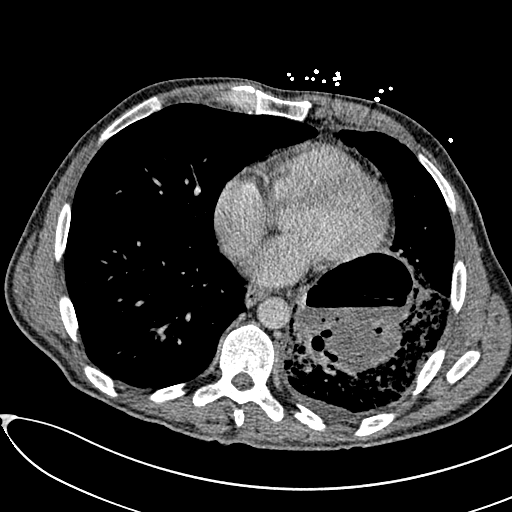
[im 59/158  lung]
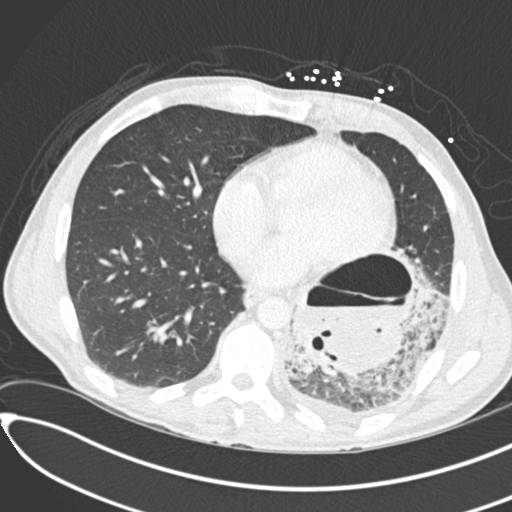
[im 70/158  lung]
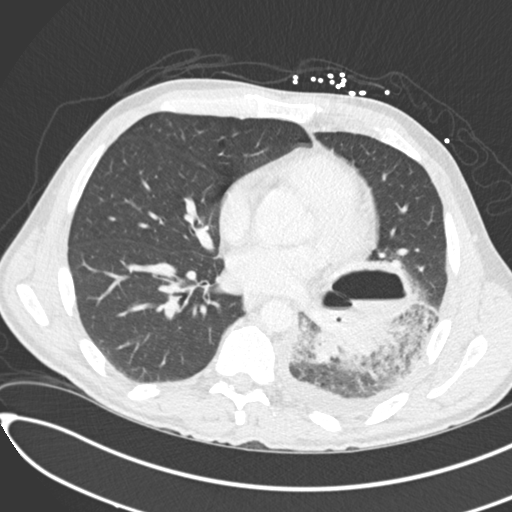
[im 88/158  lung]
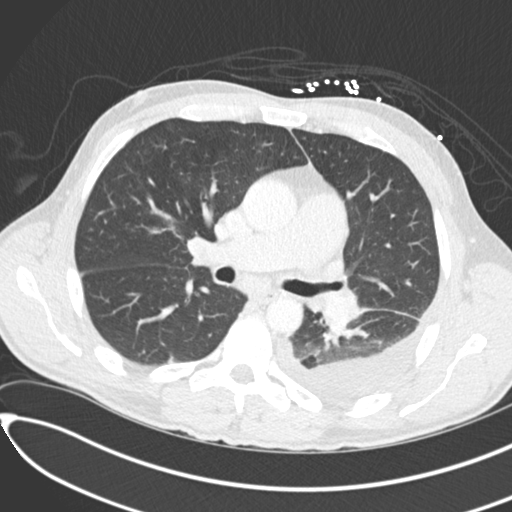
[im 99/158  lung]
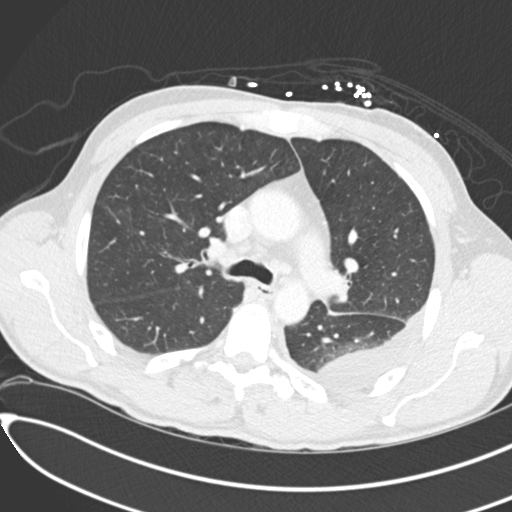
[im 111/158  mediastinal]
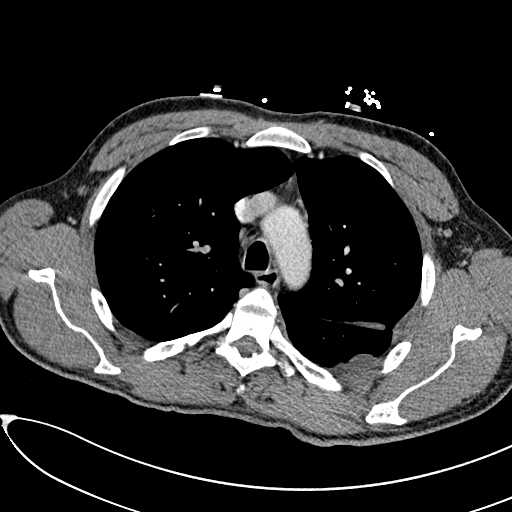
[im 111/158  lung]
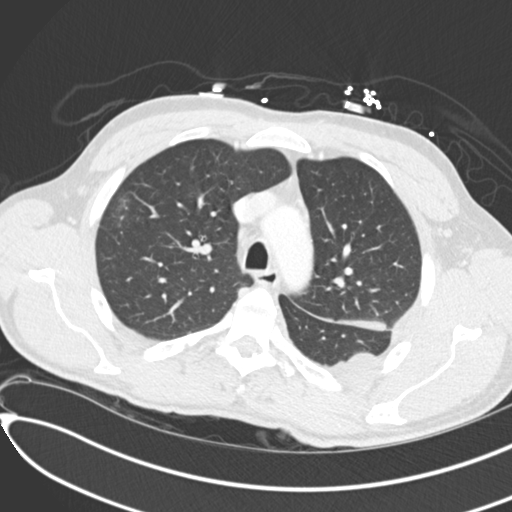
[im 123/158  lung]
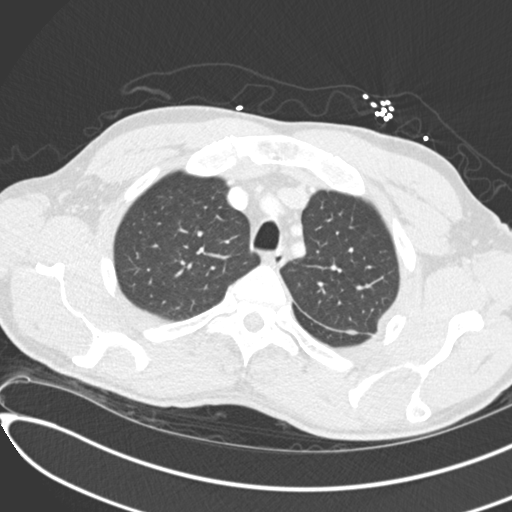
[im 134/158  lung]
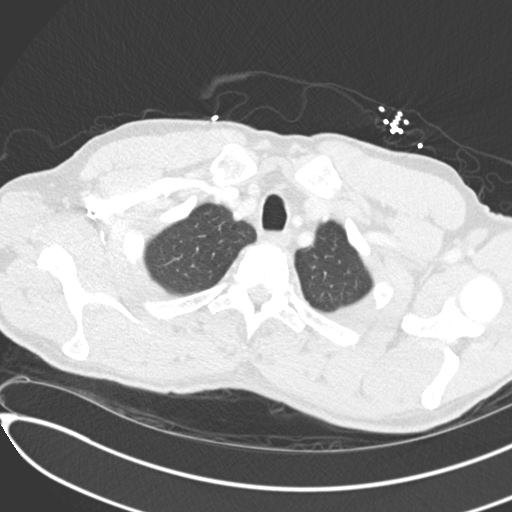
[im 146/158  lung]
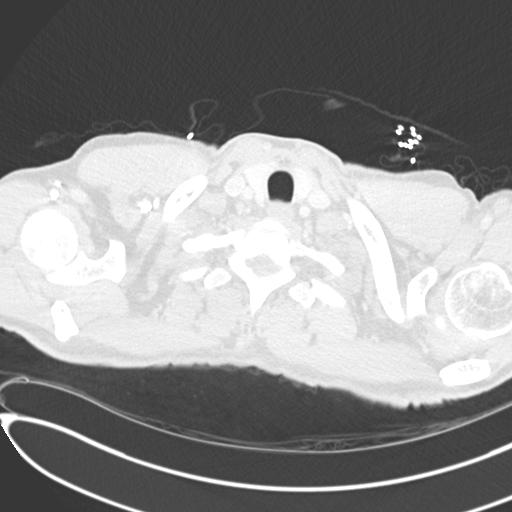

[Series 5: coronal · coronal · 0.63mm/px · 3 of 115 slices shown]
[im 23/115  lung]
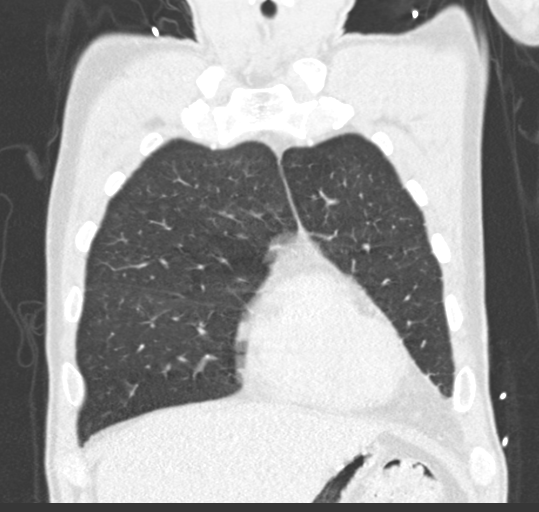
[im 46/115  lung]
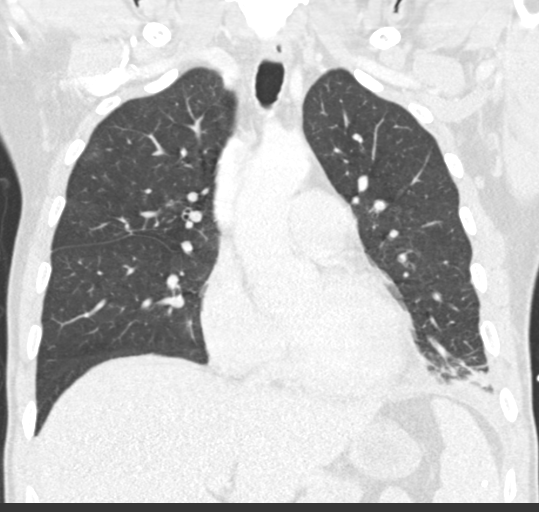
[im 69/115  lung]
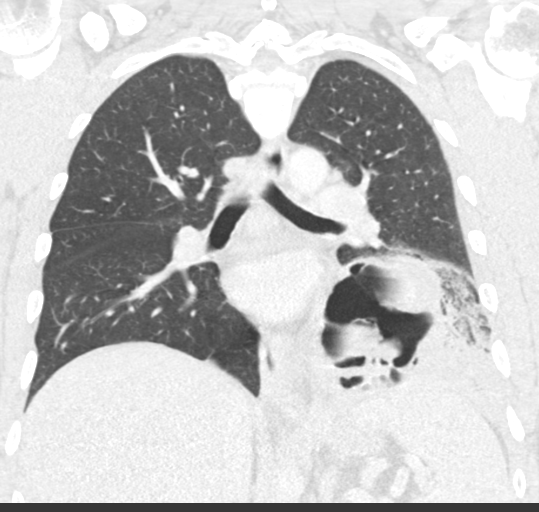

[15 of 36 positions shown; findings below may reference images not displayed]

FINDINGS: Cardiovascular: The thoracic aorta is nonaneurysmal with no
dissection or atherosclerotic change. The heart is unchanged. The
central pulmonary artery measures 3.8 cm. No obvious filling
defects.

Mediastinum/Nodes: Thyroid and esophagus are normal. Shotty nodes in
the mediastinum without adenopathy are likely reactive.

Lungs/Pleura: Central airways are normal. The patient's known left
lower lobe pneumonia is again identified. There is a rounded
consolidated component as seen on series 7, image 100 measuring
by 7.8 by 10.3 cm containing an air-fluid level consistent with
necrotic lung/abscess. This accounts for the recent chest x-ray
finding. Mild patchy ground-glass opacity in the right upper lobe is
likely infectious, less focal in the interval. There is a left-sided
pleural effusion which is small, extending into the left fissure.

Upper Abdomen: No acute abnormality.

Musculoskeletal: No chest wall abnormality. No acute or significant
osseous findings.
IMPRESSION: 1. Left lower lobe pneumonia. There is now a large rounded
collection within the infiltrate containing air-fluid levels
consistent with necrosis/abscess formation.
2. Mild patchy infectious change in the right upper lobe.
3. Reactive nodes in the mediastinum.
4. The main pulmonary artery measures 3.8 cm which is mildly dilated
raising the possibility of pulmonary hypertension.
5. No other abnormalities.

## 2019-10-30 ENCOUNTER — Other Ambulatory Visit: Payer: Self-pay | Admitting: Family

## 2019-10-30 DIAGNOSIS — J45909 Unspecified asthma, uncomplicated: Secondary | ICD-10-CM

## 2019-10-31 ENCOUNTER — Other Ambulatory Visit: Payer: Self-pay | Admitting: Family

## 2019-10-31 DIAGNOSIS — R12 Heartburn: Secondary | ICD-10-CM

## 2019-11-01 IMAGING — DX DG CHEST 2V
2 series · 2 of 2 positions shown · non-contrast
Comparison: Chest x-ray of [REDACTED] 2388 and chest CT scan of the
same day

CLINICAL DATA: Productive cough, shortness of breath, HIV, previous
episode of pneumonia.

EXAM:
CHEST - 2 VIEW

[chest pa]
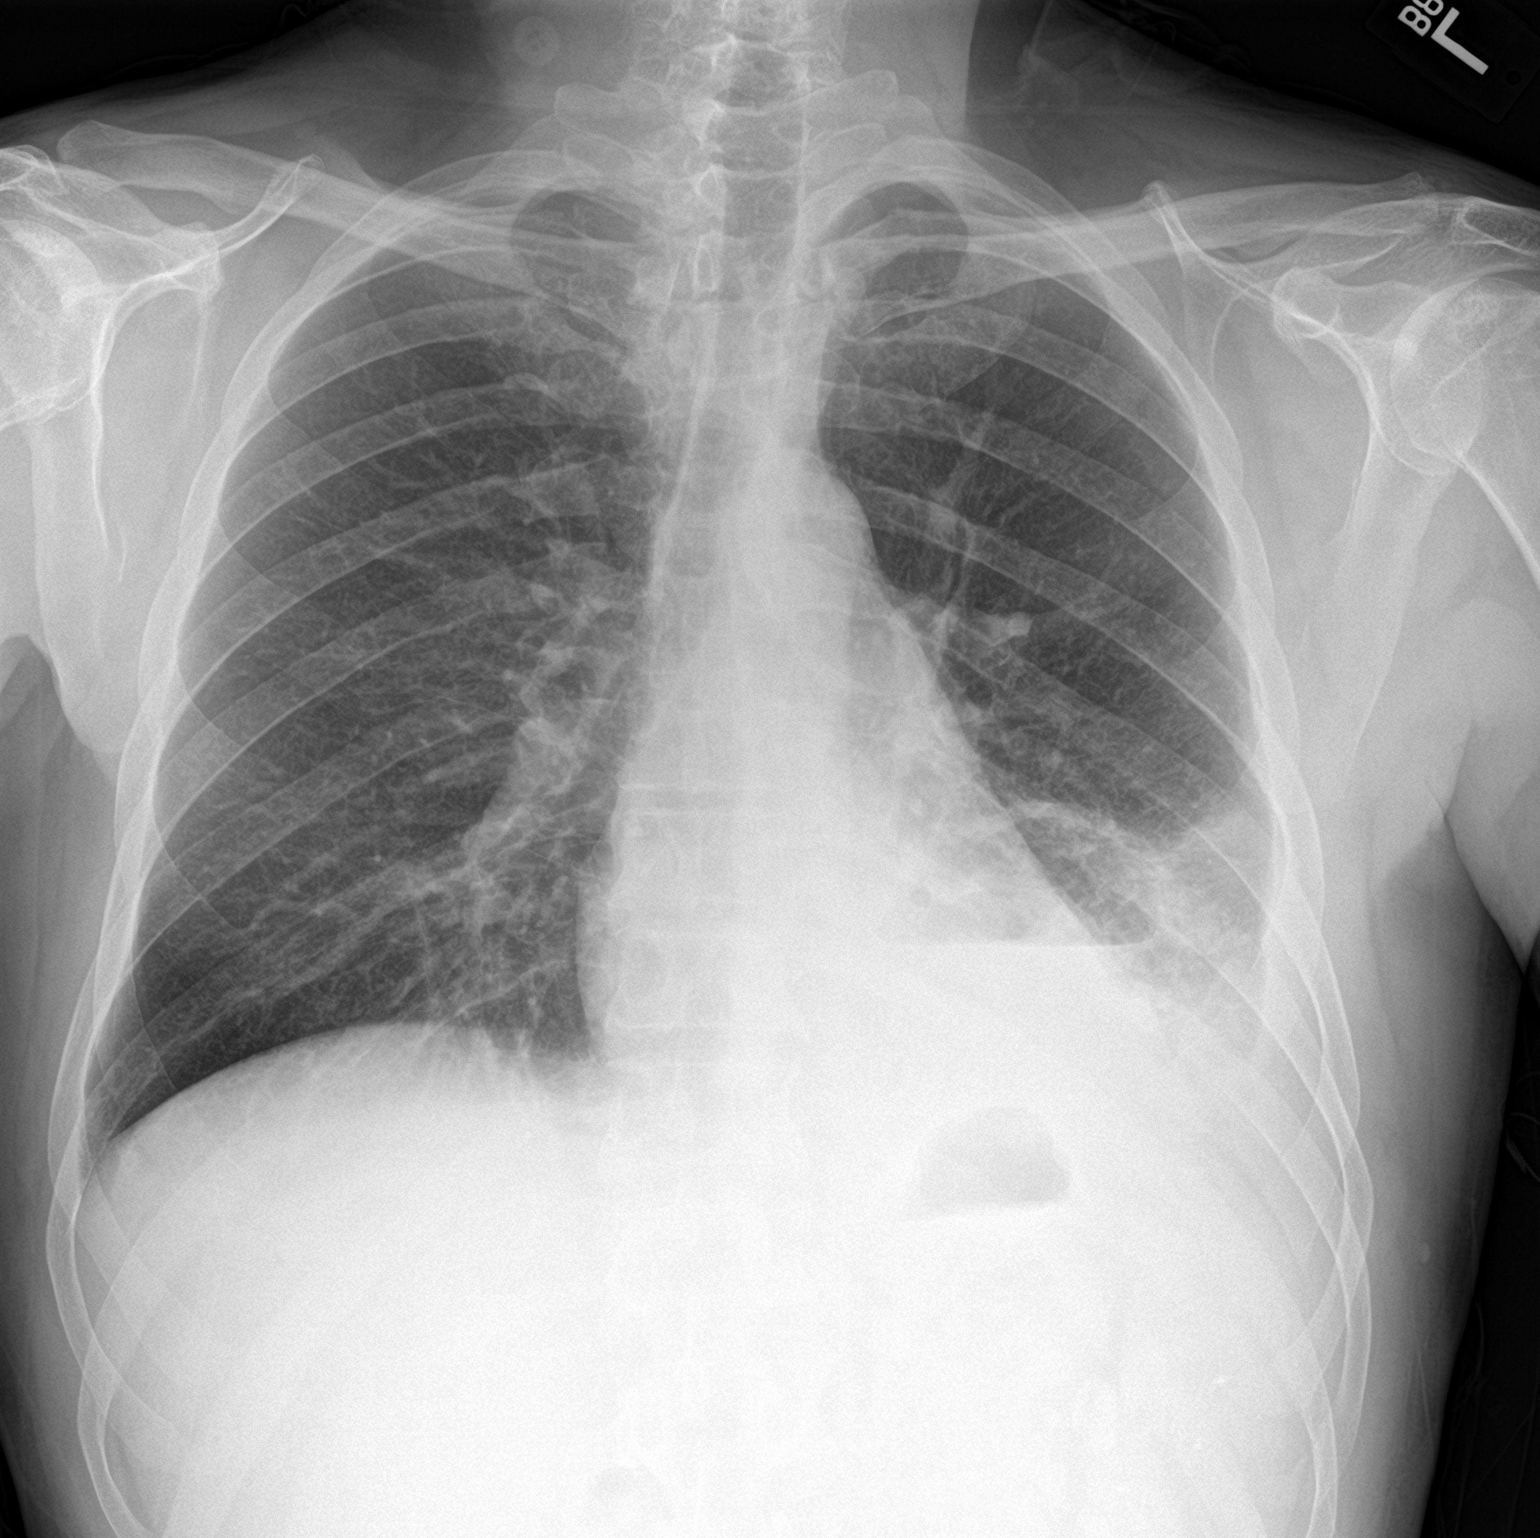

[chest lat]
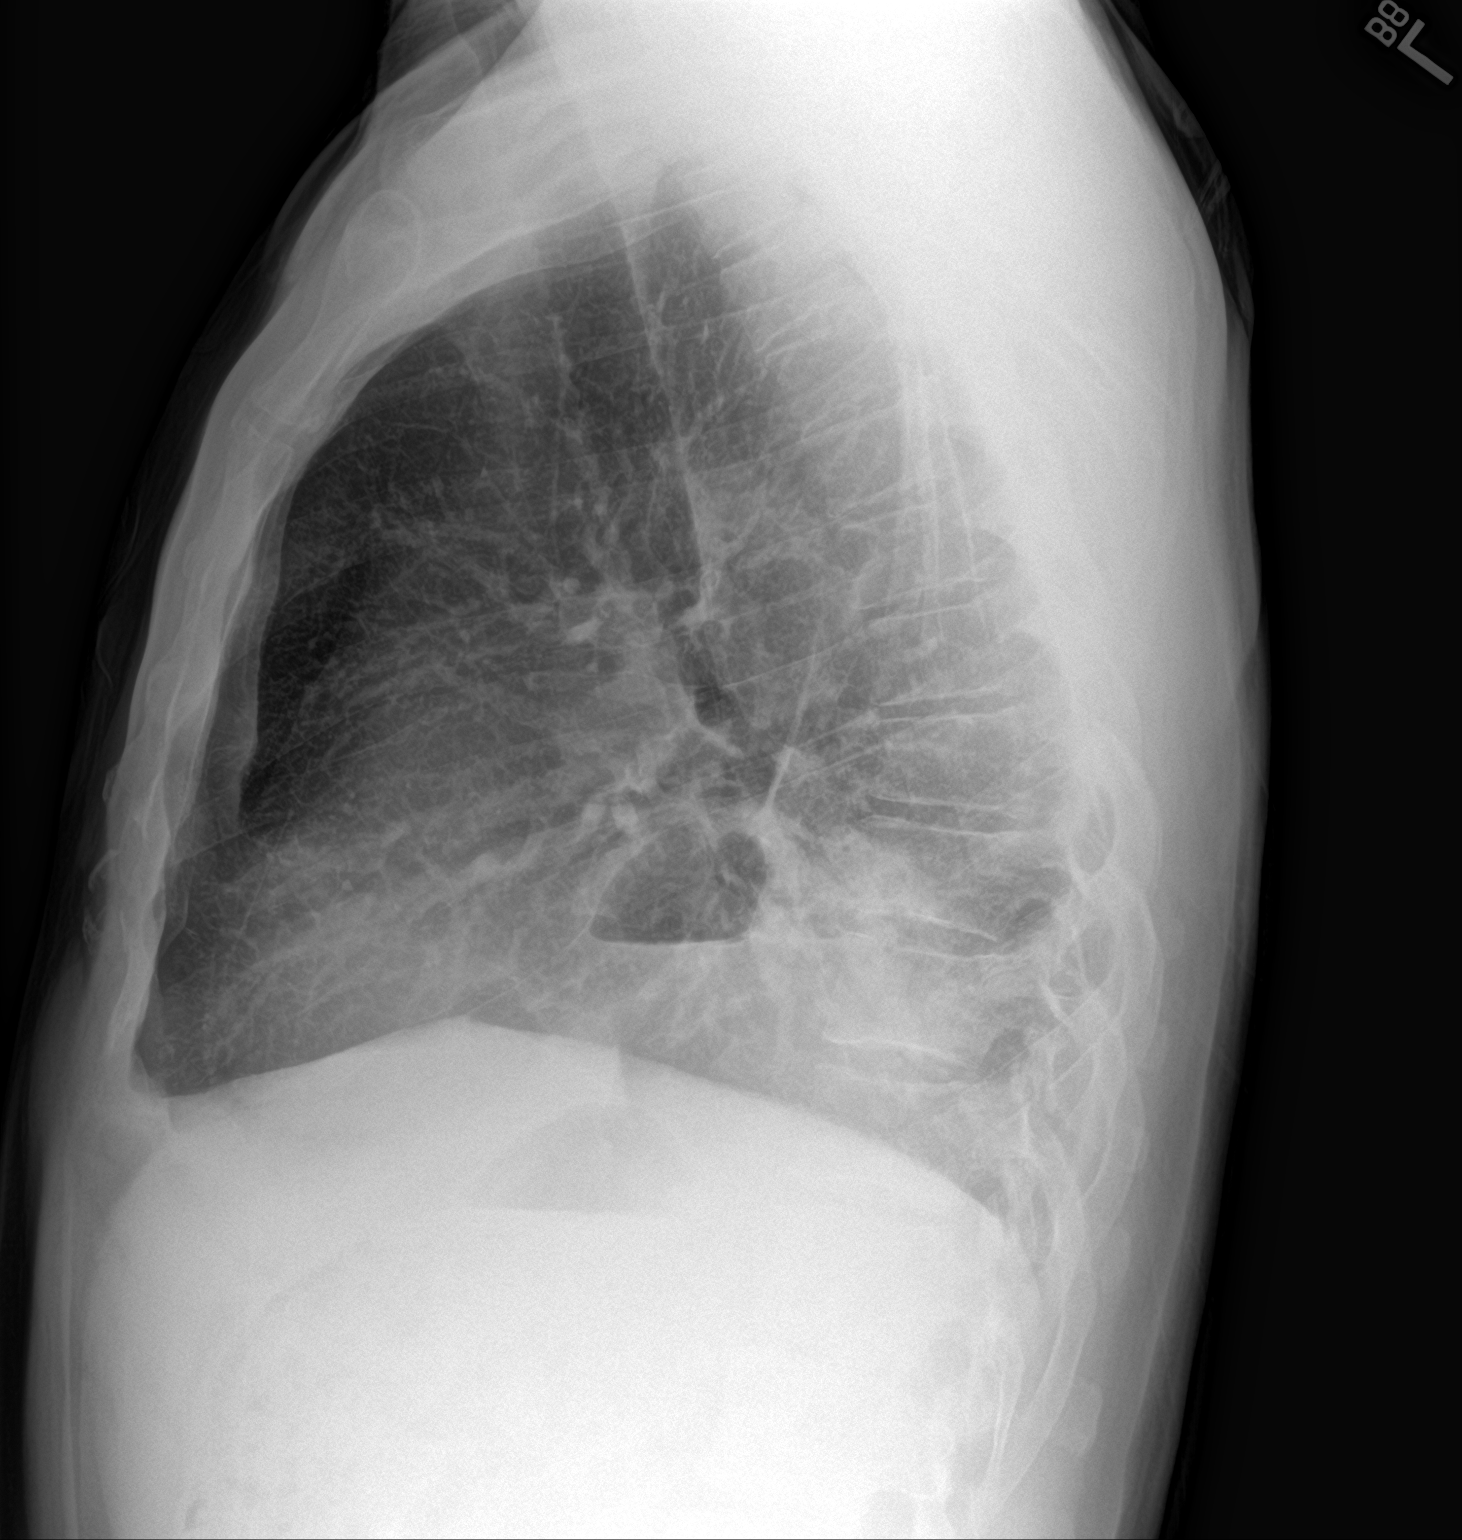

[2 of 2 positions shown; findings below may reference images not displayed]

FINDINGS: The right lung is clear. There is an air-fluid level in a cavitary
mass at the right lung base. This is better organized than on the
previous study. The heart and pulmonary vascularity are normal. The
mediastinum is normal in width. There is no pneumothorax. The bony
thorax is unremarkable.
IMPRESSION: Right lower lobe lung abscess with an air-fluid level. This is
slightly better organized today.

## 2019-11-16 ENCOUNTER — Other Ambulatory Visit: Payer: Self-pay | Admitting: Family

## 2019-11-16 DIAGNOSIS — B20 Human immunodeficiency virus [HIV] disease: Secondary | ICD-10-CM

## 2019-12-26 ENCOUNTER — Ambulatory Visit: Payer: Medicare Other | Admitting: Pharmacist

## 2019-12-27 NOTE — Progress Notes (Signed)
No show

## 2020-01-26 ENCOUNTER — Other Ambulatory Visit (HOSPITAL_BASED_OUTPATIENT_CLINIC_OR_DEPARTMENT_OTHER): Payer: Self-pay | Admitting: Emergency Medicine

## 2020-01-26 ENCOUNTER — Emergency Department (HOSPITAL_BASED_OUTPATIENT_CLINIC_OR_DEPARTMENT_OTHER)
Admission: EM | Admit: 2020-01-26 | Discharge: 2020-01-26 | Disposition: A | Discharge status: Home / Self Care | Payer: Medicare Other | Attending: Emergency Medicine | Admitting: Emergency Medicine

## 2020-01-26 ENCOUNTER — Encounter (HOSPITAL_BASED_OUTPATIENT_CLINIC_OR_DEPARTMENT_OTHER): Payer: Self-pay | Admitting: *Deleted

## 2020-01-26 ENCOUNTER — Other Ambulatory Visit: Payer: Self-pay

## 2020-01-26 DIAGNOSIS — Z87891 Personal history of nicotine dependence: Secondary | ICD-10-CM | POA: Insufficient documentation

## 2020-01-26 DIAGNOSIS — Z96643 Presence of artificial hip joint, bilateral: Secondary | ICD-10-CM | POA: Diagnosis not present

## 2020-01-26 DIAGNOSIS — R0981 Nasal congestion: Secondary | ICD-10-CM | POA: Insufficient documentation

## 2020-01-26 DIAGNOSIS — R059 Cough, unspecified: Secondary | ICD-10-CM | POA: Insufficient documentation

## 2020-01-26 DIAGNOSIS — R0602 Shortness of breath: Secondary | ICD-10-CM | POA: Insufficient documentation

## 2020-01-26 DIAGNOSIS — R0982 Postnasal drip: Secondary | ICD-10-CM | POA: Diagnosis not present

## 2020-01-26 DIAGNOSIS — B20 Human immunodeficiency virus [HIV] disease: Secondary | ICD-10-CM

## 2020-01-26 DIAGNOSIS — J45909 Unspecified asthma, uncomplicated: Secondary | ICD-10-CM | POA: Diagnosis not present

## 2020-01-26 DIAGNOSIS — R509 Fever, unspecified: Secondary | ICD-10-CM | POA: Insufficient documentation

## 2020-01-26 DIAGNOSIS — I10 Essential (primary) hypertension: Secondary | ICD-10-CM | POA: Insufficient documentation

## 2020-01-26 DIAGNOSIS — H66003 Acute suppurative otitis media without spontaneous rupture of ear drum, bilateral: Secondary | ICD-10-CM | POA: Diagnosis not present

## 2020-01-26 DIAGNOSIS — Z20822 Contact with and (suspected) exposure to covid-19: Secondary | ICD-10-CM | POA: Insufficient documentation

## 2020-01-26 DIAGNOSIS — J3489 Other specified disorders of nose and nasal sinuses: Secondary | ICD-10-CM | POA: Insufficient documentation

## 2020-01-26 DIAGNOSIS — H9203 Otalgia, bilateral: Secondary | ICD-10-CM | POA: Diagnosis present

## 2020-01-26 LAB — RESPIRATORY PANEL BY RT PCR (FLU A&B, COVID)
Influenza A by PCR: NEGATIVE
Influenza B by PCR: NEGATIVE
SARS Coronavirus 2 by RT PCR: NEGATIVE

## 2020-01-26 MED ORDER — AMOXICILLIN 500 MG PO TABS: 1000.0000 mg | ORAL_TABLET | Freq: Two times a day (BID) | ORAL | 0 refills | Status: DC

## 2020-01-26 MED ORDER — GABAPENTIN 600 MG PO TABS: ORAL_TABLET | ORAL | 0 refills | Status: DC

## 2020-01-26 MED ORDER — PREDNISONE 20 MG PO TABS: ORAL_TABLET | ORAL | 0 refills | Status: DC

## 2020-01-26 MED ORDER — ALBUTEROL SULFATE HFA 108 (90 BASE) MCG/ACT IN AERS
2.0000 | INHALATION_SPRAY | Freq: Once | RESPIRATORY_TRACT | Status: AC
  Administered 2020-01-26: 2 via RESPIRATORY_TRACT
  Filled 2020-01-26: qty 6.7

## 2020-01-26 MED ORDER — BENZONATATE 100 MG PO CAPS: 100.0000 mg | ORAL_CAPSULE | Freq: Three times a day (TID) | ORAL | 0 refills | Status: DC

## 2020-01-26 MED FILL — GABAPENTIN 600 MG TABLET: 600 | 30 days supply | Qty: 180 | Fill #0

## 2020-01-26 MED FILL — AMOXICILLIN 500 MG TABS: 500 | 7 days supply | Qty: 28 | Fill #0

## 2020-01-26 MED FILL — predniSONE 20 MG TABS: 20 | 5 days supply | Qty: 10 | Fill #0

## 2020-01-26 MED FILL — BENZONATATE 100 MG CAPS: 100 | 7 days supply | Qty: 21 | Fill #0

## 2020-01-26 NOTE — ED Notes (Signed)
He is able to text and speak in complete sentences at triage without difficulty.

## 2020-01-26 NOTE — Discharge Instructions (Signed)
Take tylenol 2 pills 4 times a day and motrin 4 pills 3 times a day.  Drink plenty of fluids.  Return for worsening shortness of breath, headache, confusion. Follow up with your family doctor.   

## 2020-01-26 NOTE — ED Provider Notes (Signed)
[facility] EMERGENCY DEPARTMENT Provider Note   CSN: [id number] Arrival date & time: 01/26/20  1354     History Chief Complaint  Patient presents with  . Covid symptoms    [name] is a 54 y.o. male.  54 yo M with a chief complaints of cough congestion shortness of breath fevers chills ear fullness sinus pressure.  Is been going on for 2-1/2 weeks.  He felt like he was getting better and then got worse over the past few days.  Stopped having fevers and started having them again.  Some worsening ear pain and difficulty hearing.  Works in [employer] and has been around a lot of people, his girlfriend and his mother both live with him and are also both ill.  The history is provided by the patient.  Illness Severity:  Moderate Onset quality:  Gradual Duration:  2 weeks Timing:  Constant Progression:  Worsening Chronicity:  New Associated symptoms: congestion, cough and rhinorrhea   Associated symptoms: no abdominal pain, no chest pain, no diarrhea, no fever, no headaches, no myalgias, no rash, no shortness of breath and no vomiting        Past Medical History:  Diagnosis Date  . Anxiety   . Arthritis   . Asthma   . Depression   . History of Clostridium difficile colitis 04/22/2017  . History of pulmonary embolus (PE) 03/17/2013   Overview:  post hip surgery, on blood thinners at the time  . HIV (human immunodeficiency virus infection) (HCC)   . Hypokalemia   . Hypotension 01/25/2015  . Immune deficiency disorder (HCC)   . Infectious folliculitis 01/30/2017  . MRSA (methicillin resistant staph aureus) culture positive   . Sepsis (HCC)   . Streptococcal pneumonia [facility])     Patient Active Problem List   Diagnosis Date Noted  . Erectile dysfunction 04/11/2019  . Generalized anxiety disorder with panic attacks 10/25/2018  . Anxiety about health 10/25/2018  . Healthcare maintenance 07/06/2018  . Numbness of right lower extremity 07/06/2018    . Heartburn 07/06/2018  . 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 S/P hip replacement 02/09/2018  . Essential hypertension   . Lung abscess (HCC) 06/21/2017  . Lung nodule, multiple 06/14/2017  . Asthma 04/22/2017  . Folliculitis 01/30/2017  . Complication of implanted device 07/24/2015  . 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 GERD (gastroesophageal reflux disease) 01/25/2015  . HIV (human immunodeficiency virus infection) (HCC) 01/24/2015  . Primary osteoarthritis of both hips 03/29/2013  . 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 TOTAL HIP ARTHROPLASTY Right 02/09/2018   Procedure: TOTAL HIP ARTHROPLASTY ANTERIOR APPROACH;  Surgeon: Durene Romans, MD;  Location: [facility] OR[facility];  Service: Orthopedics;  Laterality: Right;  70 mins       Family History  Problem Relation Age of Onset  . Cancer Mother   . CAD Brother     Social History   Tobacco Use  . Smoking status: Former Smoker    Types: Cigarettes  . Smokeless tobacco: Current User    Types: Chew  Vaping Use  . Vaping Use: Never used  Substance Use Topics  . Alcohol use: Yes    Comment: seldom  . Drug use:  No    Home Medications Prior to Admission medications   Medication Sig Start Date End Date Taking? Authorizing Provider  acetaminophen (TYLENOL) 500 MG tablet Take 2 tablets (1,000 mg total) by mouth every 8 (eight) hours. Patient not taking: Reported on 08/05/2018 02/10/18   Lanney Gins, PA-C  albuterol (VENTOLIN HFA) 108 (90 Base) MCG/ACT inhaler INHALE 2 PUFFS INTO THE LUNGS EVERY 6 HOURS AS NEEDED FOR WHEEZING OR SHORTNESS OF BREATH 10/31/19   Veryl Speak, FNP  amoxicillin (AMOXIL) 500 MG tablet Take 2 tablets (1,000 mg total) by mouth 2  (two) times daily. 01/26/20   Melene Plan, DO  benzonatate (TESSALON) 100 MG capsule Take 1 capsule (100 mg total) by mouth every 8 (eight) hours. 01/26/20   Melene Plan, DO  citalopram (CELEXA) 10 MG tablet TAKE 1 TABLET(10 MG) BY MOUTH DAILY 10/03/19   Veryl Speak, FNP  Darunavir-Cobicisctat-Emtricitabine-Tenofovir Alafenamide [facility]) 800-150-200-10 MG TABS Take 1 tablet by mouth daily with breakfast. 04/11/19   Veryl Speak, FNP  dolutegravir (TIVICAY) 50 MG tablet Take 1 tablet (50 mg total) by mouth daily. 04/11/19   Veryl Speak, FNP  fluconazole (DIFLUCAN) 200 MG tablet TAKE 1 TABLET BY MOUTH ONCE DAILY 08/25/18   Veryl Speak, FNP  gabapentin (NEURONTIN) 600 MG tablet TAKE 2 TABLETS(1200 MG) BY MOUTH THREE TIMES DAILY 10/03/19 11/02/19  Veryl Speak, FNP  methocarbamol (ROBAXIN) 500 MG tablet Take 1 tablet (500 mg total) by mouth every 6 (six) hours as needed for muscle spasms. Patient not taking: Reported on 08/05/2018 02/10/18   Lanney Gins, PA-C  omeprazole (PRILOSEC) 20 MG capsule TAKE 1 CAPSULE(20 MG) BY MOUTH DAILY 10/31/19   Veryl Speak, FNP  predniSONE (DELTASONE) 20 MG tablet 2 tabs po daily x 5 days 01/26/20   Melene Plan, DO  sildenafil (VIAGRA) 50 MG tablet Take 0.5 tablets (25 mg total) by mouth daily as needed for erectile dysfunction. 04/11/19   Veryl Speak, FNP  valACYclovir (VALTREX) 500 MG tablet TAKE 1 TABLET(500 MG) BY MOUTH DAILY 08/16/18   Daiva Eves, Lisette Grinder, MD    Allergies    Hydrocodone, Sulfa antibiotics, and Tramadol  Review of Systems   Review of Systems  Constitutional: Negative for chills and fever.  HENT: Positive for congestion, hearing loss, postnasal drip, rhinorrhea, sinus pressure and sinus pain. Negative for facial swelling.   Eyes: Negative for discharge and visual disturbance.  Respiratory: Positive for cough. Negative for shortness of breath.   Cardiovascular: Negative for chest pain and palpitations.    Gastrointestinal: Negative for abdominal pain, diarrhea and vomiting.  Musculoskeletal: Negative for arthralgias and myalgias.  Skin: Negative for color change and rash.  Neurological: Negative for tremors, syncope and headaches.  Psychiatric/Behavioral: Negative for confusion and dysphoric mood.    Physical Exam Updated Vital Signs BP 138/89 (BP Location: Right Arm)   Pulse 84   Temp 98.2 F (36.8 C) (Oral)   Resp 18   Ht 6\' 2"  (1.88 m)   Wt 83.9 kg   SpO2 97%   BMI 23.75 kg/m   Physical Exam Vitals and nursing note reviewed.  Constitutional:      Appearance: He is well-developed.  HENT:     Head: Normocephalic and atraumatic.     Comments: Swollen turbinates, posterior nasal drip, no noted sinus ttp, tm with bilateral effusion right greater than left with distortion of landmarks   Eyes:     Pupils: Pupils are equal, round, and reactive to  light.  Neck:     Vascular: No JVD.  Cardiovascular:     Rate and Rhythm: Normal rate and regular rhythm.     Heart sounds: No murmur heard.  No friction rub. No gallop.   Pulmonary:     Effort: No respiratory distress.     Breath sounds: No wheezing.  Abdominal:     General: There is no distension.     Tenderness: There is no guarding or rebound.  Musculoskeletal:        General: Normal range of motion.     Cervical back: Normal range of motion and neck supple.  Skin:    Coloration: Skin is not pale.     Findings: No rash.  Neurological:     Mental Status: He is alert and oriented to person, place, and time.  Psychiatric:        Behavior: Behavior normal.     ED Results / Procedures / Treatments   Labs (all labs ordered are listed, but only abnormal results are displayed) Labs Reviewed  RESPIRATORY PANEL BY RT PCR (FLU A&B, COVID)    EKG None  Radiology No results found.  Procedures Procedures (including critical care time)  Medications Ordered in ED Medications  albuterol (VENTOLIN HFA) 108 (90 Base)  MCG/ACT inhaler 2 puff (has no administration in time range)    ED Course  I have reviewed the triage vital signs and the nursing notes.  Pertinent labs & imaging results that were available during my care of the patient were reviewed by me and considered in my medical decision making (see chart for details).    MDM Rules/Calculators/A&P                          [age] yo M with a chief complaints of URI like symptoms.  Going on for almost 3 weeks now.  Sounds like he could have a bacterial superinfection with some improvement over a few days and then worsening of current fevers.  Has had some worsening ear pain and fullness.  Clinically has otitis.  Will start on antibiotics.  Patient is somewhat adamant and steroids tend to help him significantly as well.  Discussed with him the risks and benefits of steroids.  Electing for burst dose.  PCP follow-up.  4:24 PM:  I have discussed the diagnosis/risks/treatment options with the patient and believe the pt to be eligible for discharge home to follow-up with PCP. We also discussed returning to the ED immediately if new or worsening sx occur. We discussed the sx which are most concerning (e.g., sudden worsening pain, fever, inability to tolerate by mouth) that necessitate immediate return. Medications administered to the patient during their visit and any new prescriptions provided to the patient are listed below.  Medications given during this visit Medications  albuterol (VENTOLIN HFA) 108 (90 Base) MCG/ACT inhaler 2 puff (2 puffs Inhalation Given 01/26/20 1623)     The patient appears reasonably screen and/or stabilized for discharge and I doubt any other medical condition or other [facility] requiring further screening, evaluation, or treatment in the ED at this time prior to discharge.   Final Clinical Impression(s) / ED Diagnoses Final diagnoses:  Acute suppurative otitis media of both ears without spontaneous rupture of tympanic membranes,  recurrence not specified    Rx / DC Orders ED Discharge Orders         Ordered    amoxicillin (AMOXIL) 500 MG tablet  2 times  daily        01/26/20 1620    benzonatate (TESSALON) 100 MG capsule  Every 8 hours        01/26/20 1620    predniSONE (DELTASONE) 20 MG tablet        01/26/20 1620           Melene Plan, DO 01/26/20 1624

## 2020-01-26 NOTE — ED Triage Notes (Signed)
Cough, chest pain, loss of taste, loss of smell, body aches, fever, runny nose. Right shoulder pain with numbness and tingling.

## 2020-02-24 ENCOUNTER — Other Ambulatory Visit: Payer: Self-pay | Admitting: Family

## 2020-02-24 DIAGNOSIS — B20 Human immunodeficiency virus [HIV] disease: Secondary | ICD-10-CM

## 2020-06-15 ENCOUNTER — Other Ambulatory Visit: Payer: Self-pay | Admitting: Family

## 2020-06-15 DIAGNOSIS — J45909 Unspecified asthma, uncomplicated: Secondary | ICD-10-CM

## 2020-06-15 DIAGNOSIS — B20 Human immunodeficiency virus [HIV] disease: Secondary | ICD-10-CM

## 2020-06-18 IMAGING — DX DG PORTABLE PELVIS
1 series · 1 of 1 positions shown · non-contrast
Comparison: Fluoroscopic images of same day. Radiographs April 13, 2017.

CLINICAL DATA: Status post right hip replacement.

EXAM:
PORTABLE PELVIS 1-2 VIEWS

[pelvis ap]
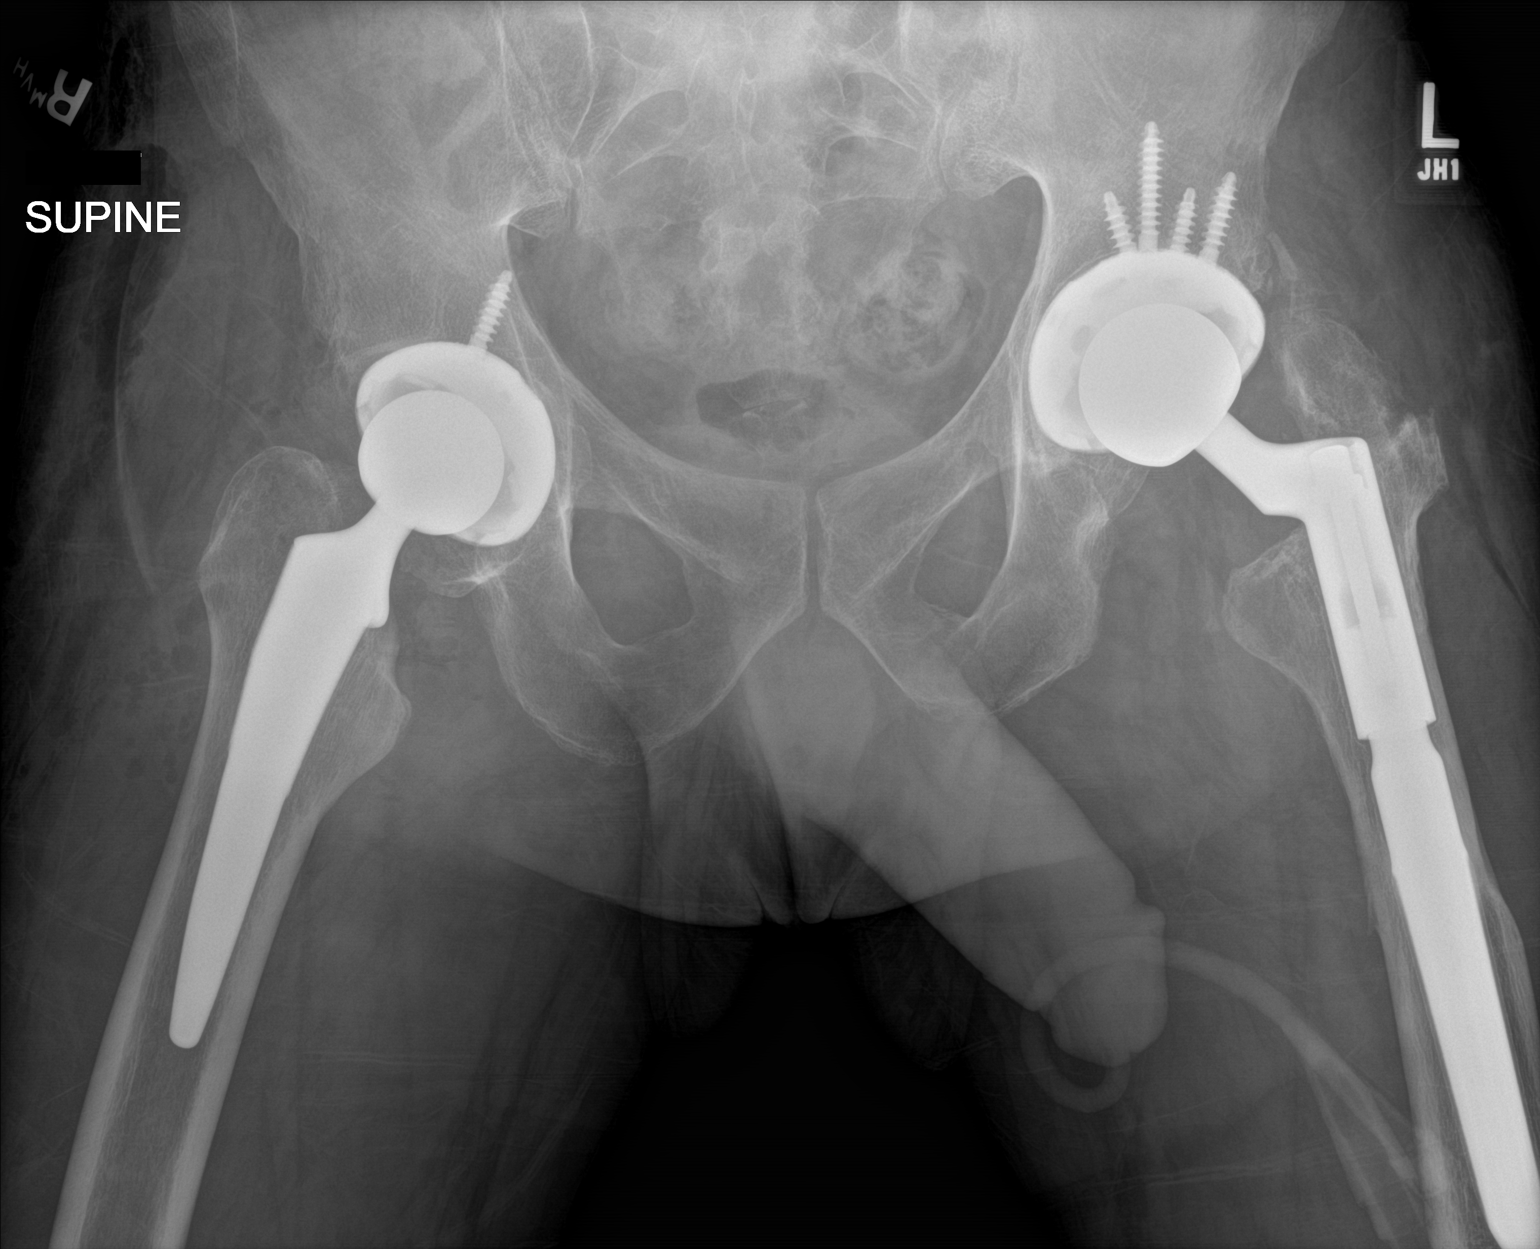

[1 of 1 positions shown; findings below may reference images not displayed]

FINDINGS: Interval placement of right total hip arthroplasty. The right
acetabular and femoral components appear to be well situated.
Previously noted left hip arthroplasty is noted. Expected
postoperative changes are noted around the right hip. No fracture or
dislocation is noted.
IMPRESSION: Status post right total hip arthroplasty.

## 2020-08-20 ENCOUNTER — Other Ambulatory Visit: Payer: Self-pay

## 2020-08-20 ENCOUNTER — Ambulatory Visit (INDEPENDENT_AMBULATORY_CARE_PROVIDER_SITE_OTHER): Payer: Medicare Other | Admitting: Pharmacist

## 2020-08-20 DIAGNOSIS — J45909 Unspecified asthma, uncomplicated: Secondary | ICD-10-CM | POA: Diagnosis not present

## 2020-08-20 DIAGNOSIS — Z113 Encounter for screening for infections with a predominantly sexual mode of transmission: Secondary | ICD-10-CM | POA: Diagnosis not present

## 2020-08-20 DIAGNOSIS — Z0000 Encounter for general adult medical examination without abnormal findings: Secondary | ICD-10-CM | POA: Diagnosis not present

## 2020-08-20 DIAGNOSIS — R200 Anesthesia of skin: Secondary | ICD-10-CM | POA: Diagnosis not present

## 2020-08-20 DIAGNOSIS — K2100 Gastro-esophageal reflux disease with esophagitis, without bleeding: Secondary | ICD-10-CM

## 2020-08-20 DIAGNOSIS — Z79899 Other long term (current) drug therapy: Secondary | ICD-10-CM | POA: Diagnosis not present

## 2020-08-20 DIAGNOSIS — N529 Male erectile dysfunction, unspecified: Secondary | ICD-10-CM

## 2020-08-20 DIAGNOSIS — F410 Panic disorder [episodic paroxysmal anxiety] without agoraphobia: Secondary | ICD-10-CM | POA: Diagnosis not present

## 2020-08-20 DIAGNOSIS — B009 Herpesviral infection, unspecified: Secondary | ICD-10-CM | POA: Diagnosis not present

## 2020-08-20 DIAGNOSIS — F418 Other specified anxiety disorders: Secondary | ICD-10-CM

## 2020-08-20 DIAGNOSIS — R12 Heartburn: Secondary | ICD-10-CM | POA: Diagnosis not present

## 2020-08-20 DIAGNOSIS — B20 Human immunodeficiency virus [HIV] disease: Secondary | ICD-10-CM

## 2020-08-20 DIAGNOSIS — F411 Generalized anxiety disorder: Secondary | ICD-10-CM | POA: Diagnosis not present

## 2020-08-20 MED ORDER — TIVICAY 50 MG PO TABS: 50.0000 mg | ORAL_TABLET | Freq: Every day | ORAL | 5 refills | Status: DC

## 2020-08-20 MED ORDER — VENLAFAXINE HCL ER 37.5 MG PO CP24: 37.5000 mg | ORAL_CAPSULE | Freq: Every day | ORAL | 3 refills | Status: DC

## 2020-08-20 MED ORDER — GABAPENTIN 600 MG PO TABS: 1200.0000 mg | ORAL_TABLET | Freq: Three times a day (TID) | ORAL | 3 refills | Status: DC

## 2020-08-20 MED ORDER — SYMTUZA 800-150-200-10 MG PO TABS: 1.0000 | ORAL_TABLET | Freq: Every day | ORAL | 5 refills | Status: DC

## 2020-08-20 MED ORDER — OMEPRAZOLE 20 MG PO CPDR: 20.0000 mg | DELAYED_RELEASE_CAPSULE | Freq: Every day | ORAL | 3 refills | Status: DC

## 2020-08-20 MED ORDER — ALBUTEROL SULFATE HFA 108 (90 BASE) MCG/ACT IN AERS: 2.0000 | INHALATION_SPRAY | Freq: Four times a day (QID) | RESPIRATORY_TRACT | 5 refills | Status: DC | PRN

## 2020-08-20 MED ORDER — VALACYCLOVIR HCL 500 MG PO TABS: 500.0000 mg | ORAL_TABLET | Freq: Every day | ORAL | 11 refills | Status: AC

## 2020-08-20 MED ORDER — SILDENAFIL CITRATE 50 MG PO TABS: 25.0000 mg | ORAL_TABLET | Freq: Every day | ORAL | 0 refills | Status: DC | PRN

## 2020-08-20 NOTE — Progress Notes (Signed)
HPI: [name] is a 55 y.o. male who presents to the [place] clinic for HIV follow-up.  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   ALBUTEROL (VENTOLIN HFA) 108 (90 BASE) MCG/ACT INHALER    INHALE 2 PUFFS INTO THE LUNGS EVERY 6 HOURS AS NEEDED FOR WHEEZING OR SHORTNESS OF BREATH   AMOXICILLIN (AMOXIL) 500 MG TABLET    TAKE 2 TABLETS BY MOUTH TWICE A DAY   BENZONATATE (TESSALON) 100 MG CAPSULE    TAKE 1 CAPSULE BY MOUTH EVERY 8 HOURS   CITALOPRAM (CELEXA) 10 MG TABLET    TAKE 1 TABLET(10 MG) BY MOUTH DAILY   DARUNAVIR-COBICISCTAT-EMTRICITABINE-TENOFOVIR ALAFENAMIDE (SYMTUZA) 800-150-200-10 MG TABS    Take 1 tablet by mouth daily with breakfast.   DOLUTEGRAVIR (TIVICAY) 50 MG TABLET    Take 1 tablet (50 mg total) by mouth daily.   FLUCONAZOLE (DIFLUCAN) 200 MG TABLET    TAKE 1 TABLET BY MOUTH ONCE DAILY   GABAPENTIN  (NEURONTIN) 600 MG TABLET    TAKE 2 TABLETS BY MOUTH THREE TIMES DAILY   METHOCARBAMOL (ROBAXIN) 500 MG TABLET    Take 1 tablet (500 mg total) by mouth every 6 (six) hours as needed for muscle spasms.   OMEPRAZOLE (PRILOSEC) 20 MG CAPSULE    TAKE 1 CAPSULE(20 MG) BY MOUTH DAILY   PREDNISONE (DELTASONE) 20 MG TABLET    TAKE 2 TABLETS BY MOUTH DAILY FOR 5 DAYS   SILDENAFIL (VIAGRA) 50 MG TABLET    Take 0.5 tablets (25 mg total) by mouth daily as needed for erectile dysfunction.   VALACYCLOVIR (VALTREX) 500 MG TABLET    TAKE 1 TABLET(500 MG) BY MOUTH DAILY  Modified Medications   No medications on file  Discontinued Medications   No medications on file    Allergies: Allergies  Allergen Reactions   Hydrocodone Other (See Comments)    "makes me overly sleepy"   Sulfa Antibiotics Rash   Tramadol Nausea Only    Past Medical History: Past Medical History:  Diagnosis Date   Anxiety    Arthritis    Asthma    Depression    History of Clostridium difficile colitis 04/22/2017   History of pulmonary embolus (PE) 03/17/2013   Overview:  post hip surgery, on blood thinners at the time   HIV (human immunodeficiency virus infection) ([place])    Hypokalemia    Hypotension 01/25/2015   Immune deficiency disorder ([place])  Infectious folliculitis 98/33/8250   MRSA (methicillin resistant staph aureus) culture positive    Sepsis ([place])    Streptococcal pneumonia ([place])     Social History: Social History   Socioeconomic History   Marital status: Divorced    Spouse name: Not on file   Number of children: 5   Years of education: 11   Highest education level: Not on file  Occupational History    Comment: na  Tobacco Use   Smoking status: Former Smoker    Types: Cigarettes   Smokeless tobacco: Current User    Types: Nurse, children's Use: Never used  Substance and Sexual Activity   Alcohol use: Yes    Comment: seldom   Drug use: No   Sexual activity: Not on file    Comment: declined  condoms  Other Topics Concern   Not on file  Social History Narrative   Lives alone   Social Determinants of Health   Financial Resource Strain: Not on file  Food Insecurity: Not on file  Transportation Needs: Not on file  Physical Activity: Not on file  Stress: Not on file  Social Connections: Not on file    Labs: Lab Results  Component Value Date   HIV1RNAQUANT 25 (H) 04/11/2019   HIV1RNAQUANT 50 05/20/2018   HIV1RNAQUANT <20 DETECTED (A) 03/08/2018   CD4TABS 361 (L) 04/11/2019   CD4TABS 240 (L) 05/20/2018   CD4TABS 200 (L) 03/08/2018    RPR and STI Lab Results  Component Value Date   LABRPR NON-REACTIVE 04/11/2019   LABRPR NON-REACTIVE 03/08/2018   LABRPR NON-REACTIVE 01/12/2018   LABRPR Non Reactive 01/30/2017    No flowsheet data found.  Hepatitis B Lab Results  Component Value Date   HEPBSAB NON-REACTIVE 03/08/2018   HEPBSAG NON-REACTIVE 03/08/2018   Hepatitis C Lab Results  Component Value Date   HEPCAB NON-REACTIVE 04/15/2017   Hepatitis A Lab Results  Component Value Date   HAV NON-REACTIVE 04/15/2017   Lipids: Lab Results  Component Value Date   CHOL 221 (H) 04/11/2019   TRIG 365 (H) 04/11/2019   HDL 44 04/11/2019   CHOLHDL 5.0 (H) 04/11/2019   VLDL 10 06/22/2017   LDLCALC 125 (H) 04/11/2019    Current HIV Regimen: Symtuza + Tivicay  Assessment: Hadriel is here today to follow up for his HIV infection. He has missed several appointments with Marya Amsler and was last seen by me on 12/26/19. He was last seen by Marya Amsler on 04/11/19. He is prescribed Symtuza + Tivicay for his HIV treatment.  He states that he has missed so many appointments due to having contracted COVID twice. He continues to take his HIV medications but only has a few pills left. He is out of his other chronic medications. He has noticed that he is very forgetful lately and is afraid that he is developing dementia. He has a history of Alzheimer's Disease in his family.  He is also  worried that he is developing diabetes as he has a chronic wound on his leg that is non healing. He also states that the Celexa is not working for his anxiety and panic attacks after taking it for more than a year. He was doubling up his dose on some days.  The wound on his leg looks to be a scar. It is nonpurulent or weepy. It does not look infected. He does struggle with neuropathy and takes gabapentin for this but has been out recently. He has no PCP. I will  change his anxiety medication to Effexor XL to see if he gets any benefit. Will start with low dose and titrate up if needed. Will also check hemoglobin A1c since he is concerned for diabetes. He believes his vision is getting worse as well. Since he has been out of his albuterol, he has been taking an oral medication over the counter called Primatene since he has ran out   I will refill his medications, check labs, and have him see Marya Amsler in a few weeks. He is agreeable to seeing someone else for primary care and possibly seeing neurology to evaluate memory loss if needed.  He met with Lattie Haw from research today to discuss he possibility of being in their Hepatitis B vaccine study.  Plan: - Refill Symtuza, Tivicay, Valtrex, Gabapentin, Albuterol, Omeprazole, and Viagra - Stop Celexa - Start Effexor XL 37.5 mg once daily - HIV viral load, CD4, CMET, CBC, hemoglobin A1c -  F/u with Marya Amsler 6/22 . Michaelia Beilfuss L. Shawndell Schillaci, PharmD, BCIDP, AAHIVP, CPP Clinical Pharmacist Practitioner Infectious Diseases [place] [facility] 08/20/2020, 2:59 PM

## 2020-08-21 LAB — T-HELPER CELL (CD4) - (RCID CLINIC ONLY)
CD4 % Helper T Cell: 14 % — ABNORMAL LOW (ref 33–65)
CD4 T Cell Abs: 294 /uL — ABNORMAL LOW (ref 400–1790)

## 2020-08-22 LAB — COMPREHENSIVE METABOLIC PANEL
AG Ratio: 1.8 (calc) (ref 1.0–2.5)
ALT: 36 U/L (ref 9–46)
AST: 29 U/L (ref 10–35)
Albumin: 4.6 g/dL (ref 3.6–5.1)
Alkaline phosphatase (APISO): 61 U/L (ref 35–144)
BUN: 14 mg/dL (ref 7–25)
CO2: 24 mmol/L (ref 20–32)
Calcium: 8.9 mg/dL (ref 8.6–10.3)
Chloride: 106 mmol/L (ref 98–110)
Creat: 1.32 mg/dL (ref 0.70–1.33)
Globulin: 2.6 g/dL (calc) (ref 1.9–3.7)
Glucose, Bld: 104 mg/dL — ABNORMAL HIGH (ref 65–99)
Potassium: 3.9 mmol/L (ref 3.5–5.3)
Sodium: 141 mmol/L (ref 135–146)
Total Bilirubin: 0.4 mg/dL (ref 0.2–1.2)
Total Protein: 7.2 g/dL (ref 6.1–8.1)

## 2020-08-22 LAB — CBC WITH DIFFERENTIAL/PLATELET
Absolute Monocytes: 431 cells/uL (ref 200–950)
Basophils Absolute: 28 cells/uL (ref 0–200)
Basophils Relative: 0.5 %
Eosinophils Absolute: 308 cells/uL (ref 15–500)
Eosinophils Relative: 5.5 %
HCT: 44.1 % (ref 38.5–50.0)
Hemoglobin: 15.1 g/dL (ref 13.2–17.1)
Lymphs Abs: 2234 cells/uL (ref 850–3900)
MCH: 31.1 pg (ref 27.0–33.0)
MCHC: 34.2 g/dL (ref 32.0–36.0)
MCV: 90.9 fL (ref 80.0–100.0)
MPV: 10.2 fL (ref 7.5–12.5)
Monocytes Relative: 7.7 %
Neutro Abs: 2598 cells/uL (ref 1500–7800)
Neutrophils Relative %: 46.4 %
Platelets: 174 10*3/uL (ref 140–400)
RBC: 4.85 10*6/uL (ref 4.20–5.80)
RDW: 14 % (ref 11.0–15.0)
Total Lymphocyte: 39.9 %
WBC: 5.6 10*3/uL (ref 3.8–10.8)

## 2020-08-22 LAB — HEMOGLOBIN A1C
Hgb A1c MFr Bld: 4.9 % of total Hgb (ref <5.7)
Mean Plasma Glucose: 94 mg/dL
eAG (mmol/L): 5.2 mmol/L

## 2020-08-22 LAB — HIV-1 RNA QUANT-NO REFLEX-BLD
HIV 1 RNA Quant: 984 Copies/mL — ABNORMAL HIGH
HIV-1 RNA Quant, Log: 2.99 Log cps/mL — ABNORMAL HIGH

## 2020-09-04 ENCOUNTER — Encounter: Payer: Medicare Other | Admitting: *Deleted

## 2020-09-05 ENCOUNTER — Encounter: Payer: Self-pay | Admitting: Family

## 2020-09-05 ENCOUNTER — Ambulatory Visit (INDEPENDENT_AMBULATORY_CARE_PROVIDER_SITE_OTHER): Payer: Medicare Other | Admitting: Family

## 2020-09-05 ENCOUNTER — Other Ambulatory Visit: Payer: Self-pay

## 2020-09-05 ENCOUNTER — Encounter (INDEPENDENT_AMBULATORY_CARE_PROVIDER_SITE_OTHER): Payer: Self-pay | Admitting: *Deleted

## 2020-09-05 VITALS — BP 147/95 | HR 69 | Temp 98.2°F | Wt 223.5 lb

## 2020-09-05 VITALS — BP 147/95 | HR 69 | Temp 98.2°F | Resp 16 | Ht 74.0 in | Wt 223.8 lb

## 2020-09-05 DIAGNOSIS — F410 Panic disorder [episodic paroxysmal anxiety] without agoraphobia: Secondary | ICD-10-CM

## 2020-09-05 DIAGNOSIS — Z0000 Encounter for general adult medical examination without abnormal findings: Secondary | ICD-10-CM | POA: Diagnosis not present

## 2020-09-05 DIAGNOSIS — F418 Other specified anxiety disorders: Secondary | ICD-10-CM

## 2020-09-05 DIAGNOSIS — Z23 Encounter for immunization: Secondary | ICD-10-CM | POA: Diagnosis not present

## 2020-09-05 DIAGNOSIS — F411 Generalized anxiety disorder: Secondary | ICD-10-CM

## 2020-09-05 DIAGNOSIS — Z21 Asymptomatic human immunodeficiency virus [HIV] infection status: Secondary | ICD-10-CM

## 2020-09-05 DIAGNOSIS — Z006 Encounter for examination for normal comparison and control in clinical research program: Secondary | ICD-10-CM

## 2020-09-05 DIAGNOSIS — Z114 Encounter for screening for human immunodeficiency virus [HIV]: Secondary | ICD-10-CM

## 2020-09-05 DIAGNOSIS — B20 Human immunodeficiency virus [HIV] disease: Secondary | ICD-10-CM

## 2020-09-05 DIAGNOSIS — R4589 Other symptoms and signs involving emotional state: Secondary | ICD-10-CM

## 2020-09-05 MED ORDER — VENLAFAXINE HCL ER 75 MG PO CP24: 75.0000 mg | ORAL_CAPSULE | Freq: Every day | ORAL | 3 refills | Status: DC

## 2020-09-05 NOTE — Assessment & Plan Note (Signed)
Currently on Venlafaxine and appears to be helping to stabilize his mood. Agree with his request to increase medication and will change to 75 mg daily. No suicidal ideations or signs of psychosis. Recommend follow up with counseling given levels of anxiety and may benefit from Psychiatry as well.

## 2020-09-05 NOTE — Patient Instructions (Signed)
Nice to see you.   We will continue your Symtuza and Tivicay.  Refills are available at the pharmacy.  Increase your Effexor (venlafaxine) to 75 mg daily.  Contact [facility] at [facility] [phone]  Plan for follow up in 1 month or sooner if needed.   Have a great day and stay safe!

## 2020-09-05 NOTE — Assessment & Plan Note (Signed)
   Prevnar updated today.  Discussed importance of safe sexual practice to reduce risk of STI. Condoms offered.   Will enter Hepatitis B vaccination study.   Due for routine dental care and will place referral if unable to get appointment.   Establish with Internal Medicine.

## 2020-09-05 NOTE — Progress Notes (Signed)
Patient ID: [name], male    DOB: 1965/03/29, 55 y.o.   MRN: [id number]    Subjective:    Chief Complaint  Patient presents with   Follow-up    Complaining of severe memory loss. Family Hx of Alzheimer. Refuses to get PCP. Can not complete any depression screenings as e states he can not recall his mood in last few weeks and wife stated to him that he is 11 different people. Not seeing neurology or psychiatry as he states we are his only doctor and he would want [place] to treat these issues. I explained he will need to see someone who is in that scope.      HPI:  [name] is a 55 y.o. male with HIV disease last seen on 04/11/2019 with well-controlled virus and good adherence and tolerance to his ART regimen of Symtuza and Tivicay.  Viral load at the time was undetectable with CD4 count 361.  Most recent blood work completed on 08/20/2020 with a viral load of 984 and CD4 count of 294.  Kidney function, liver function, electrolytes within normal ranges.  Here today for routine follow-up.  [name] has been taking his Symtuza and Tivicay daily as prescribed since his last office visit when he saw Aggie Cosier, PharmD, CPP.  Has concerns about his memory.  The venlafaxine has improved his mood and he is less irritable and requesting increase in medication to help stabilize his mood. No adverse side effects. Denies fevers, chills, night sweats, headaches, changes in vision, neck pain/stiffness, nausea, diarrhea, vomiting, lesions or rashes.  [name] continues to have coverage with Medicare. Has continued anxiety with decreased depression. Currently smokes marijuana and drinks alcohol on occasion. Uses chew on a daily basis. Condoms offered. Due for routine dental care.    Prevnar  Allergies  Allergen Reactions   Hydrocodone Other (See Comments)    "makes me overly sleepy"   Sulfa Antibiotics Rash   Tramadol Nausea Only      Outpatient Medications Prior to Visit   Medication Sig Dispense Refill   albuterol (VENTOLIN HFA) 108 (90 Base) MCG/ACT inhaler Inhale 2 puffs into the lungs every 6 (six) hours as needed for wheezing or shortness of breath. 54 g 5   Darunavir-Cobicisctat-Emtricitabine-Tenofovir Alafenamide (SYMTUZA) 800-150-200-10 MG TABS Take 1 tablet by mouth daily with breakfast. 30 tablet 5   dolutegravir (TIVICAY) 50 MG tablet Take 1 tablet (50 mg total) by mouth daily. 30 tablet 5   gabapentin (NEURONTIN) 600 MG tablet Take 2 tablets (1,200 mg total) by mouth 3 (three) times daily. 180 tablet 3   omeprazole (PRILOSEC) 20 MG capsule Take 1 capsule (20 mg total) by mouth daily. 30 capsule 3   sildenafil (VIAGRA) 50 MG tablet Take 0.5 tablets (25 mg total) by mouth daily as needed for erectile dysfunction. 10 tablet 0   valACYclovir (VALTREX) 500 MG tablet Take 1 tablet (500 mg total) by mouth daily. 30 tablet 11   venlafaxine XR (EFFEXOR-XR) 37.5 MG 24 hr capsule Take 1 capsule (37.5 mg total) by mouth daily with breakfast. 30 capsule 3   acetaminophen (TYLENOL) 500 MG tablet Take 2 tablets (1,000 mg total) by mouth every 8 (eight) hours. (Patient not taking: Reported on 08/05/2018) 30 tablet 0   methocarbamol (ROBAXIN) 500 MG tablet Take 1 tablet (500 mg total) by mouth every 6 (six) hours as needed for muscle spasms. (Patient not taking: Reported on 08/05/2018) 40 tablet 0   No facility-administered medications  prior to visit.     Past Medical History:  Diagnosis Date   Anxiety    Arthritis    Asthma    Depression    History of Clostridium difficile colitis 04/22/2017   History of pulmonary embolus (PE) 03/17/2013   Overview:  post hip surgery, on blood thinners at the time   HIV (human immunodeficiency virus infection) (HCC)    Hypokalemia    Hypotension 01/25/2015   Immune deficiency disorder (HCC)    Infectious folliculitis 01/30/2017   MRSA (methicillin resistant staph aureus) culture positive    Sepsis (HCC)    Streptococcal  pneumonia [facility])      Past Surgical History:  Procedure Laterality Date   hip arthroplasty Left 2016   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, I&D with replacement in 2017   TOTAL HIP ARTHROPLASTY Right 02/09/2018   Procedure: TOTAL HIP ARTHROPLASTY ANTERIOR APPROACH;  Surgeon: Durene Romans, MD;  Location: [facility];  Service: Orthopedics;  Laterality: Right;  70 mins       Review of Systems  Constitutional:  Negative for appetite change, chills, fatigue, fever and unexpected weight change.  Eyes:  Negative for visual disturbance.  Respiratory:  Negative for cough, chest tightness, shortness of breath and wheezing.   Cardiovascular:  Negative for chest pain and leg swelling.  Gastrointestinal:  Negative for abdominal pain, constipation, diarrhea, nausea and vomiting.  Genitourinary:  Negative for dysuria, flank pain, frequency, genital sores, hematuria and urgency.  Skin:  Negative for rash.  Allergic/Immunologic: Negative for immunocompromised state.  Neurological:  Negative for dizziness and headaches.     Objective:    BP (!) 147/95   Pulse 69   Temp 98.2 F (36.8 C)   Resp 16   Ht 6\' 2"  (1.88 m)   Wt 223 lb 12.8 oz (101.5 kg)   SpO2 98%   BMI 28.73 kg/m  Nursing note and vital signs reviewed.  Physical Exam Constitutional:      General: He is not in acute distress.    Appearance: He is well-developed.  Eyes:     Conjunctiva/sclera: Conjunctivae normal.  Cardiovascular:     Rate and Rhythm: Normal rate and regular rhythm.     Heart sounds: Normal heart sounds. No murmur heard.   No friction rub. No gallop.  Pulmonary:     Effort: Pulmonary effort is normal. No respiratory distress.     Breath sounds: Normal breath sounds. No wheezing or rales.  Chest:     Chest wall: No tenderness.  Abdominal:     General: Bowel sounds are normal.     Palpations: Abdomen is soft.     Tenderness: There is no abdominal tenderness.  Musculoskeletal:     Cervical back: Neck  supple.  Lymphadenopathy:     Cervical: No cervical adenopathy.  Skin:    General: Skin is warm and dry.     Findings: No rash.  Neurological:     Mental Status: He is alert and oriented to person, place, and time.  Psychiatric:        Behavior: Behavior normal.        Thought Content: Thought content normal.        Judgment: Judgment normal.     Depression screen [facility] 2/9 04/05/2018 02/08/2018 05/04/2017  Decreased Interest 0 0 0  Down, Depressed, Hopeless 0 0 0  PHQ - 2 Score 0 0 0       Assessment & Plan:    Patient Active Problem List  Diagnosis Date Noted   HIV (human immunodeficiency virus infection) (HCC) 01/24/2015    Priority: High   Erectile dysfunction 04/11/2019   Generalized anxiety disorder with panic attacks 10/25/2018   Anxiety about health 10/25/2018   Healthcare maintenance 07/06/2018   Numbness of right lower extremity 07/06/2018   Heartburn 07/06/2018   S/P right THA, AA 02/09/2018   S/P hip replacement 02/09/2018   Essential hypertension    Lung abscess (HCC) 06/21/2017   Lung nodule, multiple 06/14/2017   Asthma 04/22/2017   Folliculitis 01/30/2017   Complication of implanted device 07/24/2015   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/2015   Severe episode of recurrent major depressive disorder, without psychotic features (HCC) 04/27/2015   GERD (gastroesophageal reflux disease) 01/25/2015   Primary osteoarthritis of both hips 03/29/2013   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 Ran out of medication for about 1 month prior to his appointment with pharmacy with lab work showing viral load of 984. Now with improved adherence and good tolerance. No signs/symptoms of opportunistic infection. We  reviewed lab work and discussed plan of care. Will enter Hepatitis B vaccination study. Continue current dose of Symtuza and Tivicay. Plan for follow up in 1 month or sooner if needed.        Healthcare maintenance    Prevnar updated today. Discussed importance of safe sexual practice to reduce risk of STI. Condoms offered.  Will enter Hepatitis B vaccination study.  Due for routine dental care and will place referral if unable to get appointment.  Establish with Internal Medicine.        Generalized anxiety disorder with panic attacks    Currently on Venlafaxine and appears to be helping to stabilize his mood. Agree with his request to increase medication and will change to 75 mg daily. No suicidal ideations or signs of psychosis. Recommend follow up with counseling given levels of anxiety and may benefit from Psychiatry as well.        Relevant Medications   venlafaxine XR (EFFEXOR XR) 75 MG 24 hr capsule   Anxiety about health   Relevant Medications   venlafaxine XR (EFFEXOR XR) 75 MG 24 hr capsule   Other Visit Diagnoses     Need for pneumococcal vaccination       Relevant Orders   Pneumococcal conjugate vaccine 13-valent IM (Completed)        I have discontinued [name]. Edmunds's methocarbamol, acetaminophen, and venlafaxine XR. I am also having him start on venlafaxine XR. Additionally, I am having him maintain his Symtuza, Tivicay, gabapentin, omeprazole, valACYclovir, sildenafil, and albuterol.   Meds ordered this encounter  Medications   venlafaxine XR (EFFEXOR XR) 75 MG 24 hr capsule    Sig: Take 1 capsule (75 mg total) by mouth daily with breakfast.    Dispense:  30 capsule    Refill:  3    Order Specific Question:   Supervising Provider    Answer:   Judyann Munson [[id number]]     Follow-up: Return in about 1 month (around 10/05/2020), or if symptoms worsen or fail to improve.   Marcos Eke, MSN, FNP-C Nurse Practitioner [facility] Main  number: [phone]

## 2020-09-05 NOTE — Assessment & Plan Note (Signed)
[name] has been out of care for approximately 1.5 years secondary to COVID and increased levels of anxiety. Ran out of medication for about 1 month prior to his appointment with pharmacy with lab work showing viral load of 984. Now with improved adherence and good tolerance. No signs/symptoms of opportunistic infection. We reviewed lab work and discussed plan of care. Will enter Hepatitis B vaccination study. Continue current dose of Symtuza and Tivicay. Plan for follow up in 1 month or sooner if needed.

## 2020-09-06 LAB — CBC AND DIFFERENTIAL
HCT: 40 — AB (ref 41–53)
Hemoglobin: 14 (ref 13.5–17.5)
Neutrophils Absolute: 3.6
WBC: 5.9

## 2020-09-06 LAB — CBC: RBC: 4.38 (ref 3.87–5.11)

## 2020-09-06 NOTE — Research (Signed)
Omarr came in today to screen for the [phone] study. He had reviewed the consent at home with his girlfriend and was able to verbalize what the study was about. Informed consent was obtained after review of the consent and all questions were answered. He said he was diagnosed back in the early '90s and started treatment around 2003. He has not always been consistent with taking his meds, and had not been seen here in over a year and then came in on 6/6 before he ran out of meds. He was able to get new prescriptions for his other meds here too and has resumed all of them. He is taking primatene tablets 3 BID for asthma and we discussed how this could be making him anxious and his BP elevated, which he had verbalized concerns about. I did tell him that there were better options for treatment for that and I would let his provider know about it. He probably needs to get a PCP to manage, but he did not want to see anyone else at this time. He also has a large healing wound on his left calf that is crusty and has increased in size per pt.  We have entry planned for 09/27/20 if eligibliity is confirmed.

## 2020-09-07 LAB — COMPREHENSIVE METABOLIC PANEL
AG Ratio: 1.5 (calc) (ref 1.0–2.5)
ALT: 50 U/L — ABNORMAL HIGH (ref 9–46)
AST: 64 U/L — ABNORMAL HIGH (ref 10–35)
Albumin: 4.4 g/dL (ref 3.6–5.1)
Alkaline phosphatase (APISO): 59 U/L (ref 35–144)
BUN: 13 mg/dL (ref 7–25)
CO2: 23 mmol/L (ref 20–32)
Calcium: 9 mg/dL (ref 8.6–10.3)
Chloride: 105 mmol/L (ref 98–110)
Creat: 1.15 mg/dL (ref 0.70–1.33)
Globulin: 2.9 g/dL (calc) (ref 1.9–3.7)
Glucose, Bld: 77 mg/dL (ref 65–99)
Potassium: 4 mmol/L (ref 3.5–5.3)
Sodium: 137 mmol/L (ref 135–146)
Total Bilirubin: 0.6 mg/dL (ref 0.2–1.2)
Total Protein: 7.3 g/dL (ref 6.1–8.1)

## 2020-09-07 LAB — HIV ANTIBODY (ROUTINE TESTING W REFLEX): HIV 1&2 Ab, 4th Generation: REACTIVE — AB

## 2020-09-07 LAB — PROTIME-INR
INR: 1.1
Prothrombin Time: 10.7 s (ref 9.0–11.5)

## 2020-09-07 LAB — PHOSPHORUS: Phosphorus: 3 mg/dL (ref 2.5–4.5)

## 2020-09-07 LAB — HIV-1/2 AB - DIFFERENTIATION
HIV-1 antibody: POSITIVE — AB
HIV-2 Ab: NEGATIVE

## 2020-09-07 LAB — CK: Total CK: 1583 U/L — ABNORMAL HIGH (ref 44–196)

## 2020-09-07 LAB — HEPATITIS B CORE ANTIBODY, TOTAL: Hep B Core Total Ab: NONREACTIVE

## 2020-09-07 LAB — HEPATITIS B SURFACE ANTIGEN: Hepatitis B Surface Ag: NONREACTIVE

## 2020-09-07 LAB — BILIRUBIN, DIRECT: Bilirubin, Direct: 0.1 mg/dL (ref 0.0–0.2)

## 2020-09-07 LAB — HEPATITIS B SURFACE ANTIBODY,QUALITATIVE: Hep B S Ab: NONREACTIVE

## 2020-09-08 LAB — HIV-1 RNA QUANT-NO REFLEX-BLD
HIV 1 RNA Quant: 42 Copies/mL — ABNORMAL HIGH
HIV-1 RNA Quant, Log: 1.62 Log cps/mL — ABNORMAL HIGH

## 2020-09-13 LAB — CD4/CD8 (T-HELPER/T-SUPPRESSOR CELL)
CD4 T Cell Abs: 359
CD4%: 21.1
CD8 % Suppressor T Cell: 47.5
CD8 T Cell Abs: 808

## 2020-09-19 ENCOUNTER — Other Ambulatory Visit: Payer: Self-pay | Admitting: Family

## 2020-09-19 DIAGNOSIS — N529 Male erectile dysfunction, unspecified: Secondary | ICD-10-CM

## 2020-09-19 NOTE — Telephone Encounter (Signed)
We can provide a refill at the current dose. Because of an interaction with his Symtuza we should not go any higher than where it already is. I sent a refill to the pharmacy.

## 2020-09-19 NOTE — Telephone Encounter (Signed)
Patient requesting refill on Viagra and it to be increased to 100 mg. Please advise.

## 2020-09-27 ENCOUNTER — Encounter: Payer: Medicare Other | Admitting: *Deleted

## 2020-09-28 ENCOUNTER — Encounter: Payer: Medicare Other | Admitting: *Deleted

## 2020-10-04 ENCOUNTER — Encounter: Payer: Medicare Other | Admitting: *Deleted

## 2020-10-05 ENCOUNTER — Ambulatory Visit: Payer: Medicare Other | Admitting: Family

## 2020-10-05 ENCOUNTER — Encounter: Payer: Medicare Other | Admitting: *Deleted

## 2020-10-16 ENCOUNTER — Other Ambulatory Visit: Payer: Self-pay

## 2020-10-16 ENCOUNTER — Encounter (INDEPENDENT_AMBULATORY_CARE_PROVIDER_SITE_OTHER): Payer: Self-pay | Admitting: *Deleted

## 2020-10-16 VITALS — BP 148/96 | HR 60 | Temp 97.7°F | Wt 215.1 lb

## 2020-10-16 DIAGNOSIS — Z006 Encounter for examination for normal comparison and control in clinical research program: Secondary | ICD-10-CM

## 2020-10-16 NOTE — Research (Signed)
[name] seen today for entry visit into [id number], the [employer] study. After verifying eligibility he was randomized to Group A Arm 2, the 3 dose Heplisav-B vaccine arm.   No new medications. He did test positive for Covid last month. States that he did not have any symptoms. States that he did the test after his wife came down with symptoms and tested positive. States that this is the 2nd time he has tested positive for Covid. He is interested in getting the Covid-19 vaccine and plans to discuss with [name] next week at his appointment. Feels that he has lost a lot of weight. States that he has also had N/V in the mornings. No abdominal discomfort. He felt that he had lost "20lbs" since his visit in June and stopped his Effexor 2 days ago thinking that this was causing his symptoms. He has lost some weight since June, but only 7lbs. Upon further questioning he feels that the weight loss started several; months ago along with the N/V. He has an appointment with Marcos Eke, NP next week. He understands that he should not abruptly stop his Effexor and plans to restart it today.  He received his 1st dose of Heplisav-B today given (R) deltoid without problem. He denied any adverse symptoms at the 30 minute post injection assessment. Temp at that time was 97.30F orally. Denied any tenderness or pain. No redness, swelling or bruising noted. He will return tomorrow for additional blood draw and then in 4 weeks for his next injection.

## 2020-10-17 ENCOUNTER — Other Ambulatory Visit: Payer: Self-pay

## 2020-10-17 ENCOUNTER — Encounter: Payer: Self-pay | Admitting: *Deleted

## 2020-10-17 DIAGNOSIS — Z006 Encounter for examination for normal comparison and control in clinical research program: Secondary | ICD-10-CM

## 2020-10-17 LAB — COMPREHENSIVE METABOLIC PANEL
AG Ratio: 1.5 (calc) (ref 1.0–2.5)
ALT: 37 U/L (ref 9–46)
AST: 33 U/L (ref 10–35)
Albumin: 4.6 g/dL (ref 3.6–5.1)
Alkaline phosphatase (APISO): 70 U/L (ref 35–144)
BUN/Creatinine Ratio: 11 (calc) (ref 6–22)
BUN: 16 mg/dL (ref 7–25)
CO2: 26 mmol/L (ref 20–32)
Calcium: 9.4 mg/dL (ref 8.6–10.3)
Chloride: 106 mmol/L (ref 98–110)
Creat: 1.44 mg/dL — ABNORMAL HIGH (ref 0.70–1.30)
Globulin: 3.1 g/dL (calc) (ref 1.9–3.7)
Glucose, Bld: 62 mg/dL — ABNORMAL LOW (ref 65–99)
Potassium: 4.5 mmol/L (ref 3.5–5.3)
Sodium: 141 mmol/L (ref 135–146)
Total Bilirubin: 0.4 mg/dL (ref 0.2–1.2)
Total Protein: 7.7 g/dL (ref 6.1–8.1)

## 2020-10-17 LAB — PHOSPHORUS: Phosphorus: 2.6 mg/dL (ref 2.5–4.5)

## 2020-10-17 LAB — BILIRUBIN, DIRECT: Bilirubin, Direct: 0.1 mg/dL (ref 0.0–0.2)

## 2020-10-17 LAB — CK: Total CK: 438 U/L — ABNORMAL HIGH (ref 44–196)

## 2020-10-17 NOTE — Research (Signed)
[name] returns today for large blood draw for [phone], the [employer] study. Labs obtained at 1025 without problem. He will return in 4 weeks for his next study visit. He is scheduled to see [name] next week.

## 2020-10-19 ENCOUNTER — Encounter: Payer: Self-pay | Admitting: Infectious Disease

## 2020-10-19 LAB — CD4/CD8 (T-HELPER/T-SUPPRESSOR CELL)
CD4 Count: 269
CD4%: 16.8
CD8 % Suppressor T Cell: 48.6
CD8: 778

## 2020-10-24 ENCOUNTER — Ambulatory Visit (INDEPENDENT_AMBULATORY_CARE_PROVIDER_SITE_OTHER): Payer: Medicare Other | Admitting: Family

## 2020-10-24 ENCOUNTER — Other Ambulatory Visit (HOSPITAL_COMMUNITY): Payer: Self-pay

## 2020-10-24 ENCOUNTER — Other Ambulatory Visit: Payer: Self-pay

## 2020-10-24 ENCOUNTER — Encounter: Payer: Self-pay | Admitting: Family

## 2020-10-24 VITALS — BP 132/63 | HR 87 | Temp 98.5°F | Wt 220.8 lb

## 2020-10-24 DIAGNOSIS — L0390 Cellulitis, unspecified: Secondary | ICD-10-CM | POA: Insufficient documentation

## 2020-10-24 DIAGNOSIS — A46 Erysipelas: Secondary | ICD-10-CM | POA: Insufficient documentation

## 2020-10-24 DIAGNOSIS — B20 Human immunodeficiency virus [HIV] disease: Secondary | ICD-10-CM | POA: Diagnosis not present

## 2020-10-24 DIAGNOSIS — L03116 Cellulitis of left lower limb: Secondary | ICD-10-CM

## 2020-10-24 DIAGNOSIS — Z21 Asymptomatic human immunodeficiency virus [HIV] infection status: Secondary | ICD-10-CM

## 2020-10-24 DIAGNOSIS — R4586 Emotional lability: Secondary | ICD-10-CM

## 2020-10-24 MED ORDER — CEFADROXIL 500 MG PO CAPS: 500.0000 mg | ORAL_CAPSULE | Freq: Two times a day (BID) | ORAL | 0 refills | Status: DC

## 2020-10-24 MED ORDER — DOXYCYCLINE HYCLATE 100 MG PO TABS: 100.0000 mg | ORAL_TABLET | Freq: Two times a day (BID) | ORAL | 0 refills | Status: DC

## 2020-10-24 NOTE — Assessment & Plan Note (Signed)
[name] has been having increased levels of mood swings and irritability which is concerning for possible underlying mental health disorder. Anxiety currently stable. Will continue current dose of Effexor and send referral for mental health evaluation with Psychiatry which he is in agreement with. Advised to seek emergent care if symptoms acutely worsen or thoughts of suicide develop.

## 2020-10-24 NOTE — Progress Notes (Signed)
Brief Narrative   Patient ID: [name], male    DOB: 13-Aug-1965, 55 y.o.   MRN: [id number]    Subjective:    Chief Complaint  Patient presents with   Follow-up    B20  - pt reports he has been having mood swings.     HPI:  [name] is a 55 y.o. male with HIV disease last seen on 09/08/20 having ben out of care for 1.5 years secondary Covid and increased levels of anxiety. Restarted on medication prior to last visit with viral load improved to undetectable at 42 and CD4 count of 359. Lexapro increased to 75 mg daily to help with anxiety/depression. Here today for routine follow up.   Mr. Crumble has been taking his Symtuza and Tivicay daily as prescribed with no adverse side effects. Has been having increased mood swings and has also had chronic pain in his left leg that has been going on for about a year and started after wearing a pair of boots. Has attempted treatment with antibiotic cream with minimal improvement over time. Progressively worsening since initial onset. Severity is enough to alter his gait. Denies fevers, chills, night sweats, headaches, changes in vision, neck pain/stiffness, nausea, diarrhea, or vomiting.  Mr. Lagace has no problems obtaining medication from the pharmacy. As noted been having mood swings more recently and how he interacts depends on most recent events. Significant other has noted changes as well. Anxiety is stable with current dose of Effexor. Continues to use marijuana and drink alcohol on occasion. No tobacco use.    Allergies  Allergen Reactions   Hydrocodone Other (See Comments)    "makes me overly sleepy"   Sulfa Antibiotics Rash   Tramadol Nausea Only      Outpatient Medications Prior to Visit  Medication Sig Dispense Refill   albuterol (VENTOLIN HFA) 108 (90 Base) MCG/ACT inhaler Inhale 2 puffs into the lungs every 6 (six) hours as needed for wheezing or shortness of breath. 54 g 5    Darunavir-Cobicisctat-Emtricitabine-Tenofovir Alafenamide (SYMTUZA) 800-150-200-10 MG TABS Take 1 tablet by mouth daily with breakfast. 30 tablet 5   dolutegravir (TIVICAY) 50 MG tablet Take 1 tablet (50 mg total) by mouth daily. 30 tablet 5   gabapentin (NEURONTIN) 600 MG tablet Take 2 tablets (1,200 mg total) by mouth 3 (three) times daily. 180 tablet 3   omeprazole (PRILOSEC) 20 MG capsule Take 1 capsule (20 mg total) by mouth daily. 30 capsule 3   sildenafil (VIAGRA) 50 MG tablet TAKE 1/2 (ONE-HALF) TABLET BY MOUTH ONCE DAILY AS NEEDED 10 tablet 0   valACYclovir (VALTREX) 500 MG tablet Take 1 tablet (500 mg total) by mouth daily. 30 tablet 11   venlafaxine XR (EFFEXOR XR) 75 MG 24 hr capsule Take 1 capsule (75 mg total) by mouth daily with breakfast. 30 capsule 3   No facility-administered medications prior to visit.     Past Medical History:  Diagnosis Date   Anxiety    Arthritis    Asthma    Depression    History of Clostridium difficile colitis 04/22/2017   History of pulmonary embolus (PE) 03/17/2013   Overview:  post hip surgery, on blood thinners at the time   HIV (human immunodeficiency virus infection) (HCC)    Hypokalemia    Hypotension 01/25/2015   Immune deficiency disorder (HCC)    Infectious folliculitis 01/30/2017   MRSA (methicillin resistant staph aureus) culture positive    Sepsis (HCC)    Streptococcal  pneumonia [facility])      Past Surgical History:  Procedure Laterality Date   hip arthroplasty Left 2016   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, I&D with replacement in 2017   TOTAL HIP ARTHROPLASTY Right 02/09/2018   Procedure: TOTAL HIP ARTHROPLASTY ANTERIOR APPROACH;  Surgeon: Durene Romans, MD;  Location: [facility];  Service: Orthopedics;  Laterality: Right;  70 mins      Review of Systems  Constitutional:  Negative for appetite change, chills, fatigue, fever and unexpected weight change.  Eyes:  Negative for visual disturbance.  Respiratory:  Negative for  cough, chest tightness, shortness of breath and wheezing.   Cardiovascular:  Negative for chest pain and leg swelling.  Gastrointestinal:  Negative for abdominal pain, constipation, diarrhea, nausea and vomiting.  Genitourinary:  Negative for dysuria, flank pain, frequency, genital sores, hematuria and urgency.  Skin:  Negative for rash.  Allergic/Immunologic: Negative for immunocompromised state.  Neurological:  Negative for dizziness and headaches.     Objective:    BP 132/63   Pulse 87   Temp 98.5 F (36.9 C) (Oral)   Wt 220 lb 12.8 oz (100.2 kg)   SpO2 96%   BMI 28.35 kg/m  Nursing note and vital signs reviewed.  Physical Exam Constitutional:      General: He is not in acute distress.    Appearance: He is well-developed.  Eyes:     Conjunctiva/sclera: Conjunctivae normal.  Cardiovascular:     Rate and Rhythm: Normal rate and regular rhythm.     Heart sounds: Normal heart sounds. No murmur heard.   No friction rub. No gallop.  Pulmonary:     Effort: Pulmonary effort is normal. No respiratory distress.     Breath sounds: Normal breath sounds. No wheezing or rales.  Chest:     Chest wall: No tenderness.  Abdominal:     General: Bowel sounds are normal.     Palpations: Abdomen is soft.     Tenderness: There is no abdominal tenderness.  Musculoskeletal:     Cervical back: Neck supple.  Lymphadenopathy:     Cervical: No cervical adenopathy.  Skin:    General: Skin is warm and dry.     Findings: No rash.  Neurological:     Mental Status: He is alert and oriented to person, place, and time.  Psychiatric:        Behavior: Behavior normal.        Thought Content: Thought content normal.        Judgment: Judgment normal.       Depression screen [facility] 2/9 04/05/2018 02/08/2018 05/04/2017  Decreased Interest 0 0 0  Down, Depressed, Hopeless 0 0 0  PHQ - 2 Score 0 0 0       Assessment & Plan:    Patient Active Problem List   Diagnosis Date Noted   HIV (human  immunodeficiency virus infection) (HCC) 01/24/2015    Priority: High   Cellulitis 10/24/2020   Mood swings 10/24/2020   Erectile dysfunction 04/11/2019   Generalized anxiety disorder with panic attacks 10/25/2018   Anxiety about health 10/25/2018   Healthcare maintenance 07/06/2018   Numbness of right lower extremity 07/06/2018   Heartburn 07/06/2018   S/P right THA, AA 02/09/2018   S/P hip replacement 02/09/2018   Essential hypertension    Lung abscess (HCC) 06/21/2017   Lung nodule, multiple 06/14/2017   Asthma 04/22/2017   Folliculitis 01/30/2017   Complication of implanted device 07/24/2015   S/P revision of total  hip 07/24/2015   Severe episode of recurrent major depressive disorder, without psychotic features (HCC) 04/27/2015   GERD (gastroesophageal reflux disease) 01/25/2015   Primary osteoarthritis of both hips 03/29/2013   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 Brouillard appears to have adequately controlled virus with good adherence and tolerance to [employer] and Tivicay. No signs/symptoms of opportunistic infection. We reviewed previous lab work and discussed plan of care. Check lab work today. Continue current dose of Symtuza and Tivicay. Plan for follow up in 1 month or sooner if needed.        Relevant Medications   cefadroxil (DURICEF) 500 MG capsule   Other Relevant Orders   COMPLETE METABOLIC PANEL WITH GFR   T-helper cell (CD4)- ([facility] clinic only)   HIV-1 RNA quant-no reflex-bld   Cellulitis    Mr. Dinino appears to have cellulitis of his left lower extremity. Cannot rule out possibility of vascular issues although he notes it started after wearing a boot. Recommend cleansing with soap and water and daily dressing change. Will treat with 14 days of cefodroxil and doxycycline as he may have interactions with  Effexor and Zyvox. Plan for follow up in 1 month or sooner if needed. Consider ABI and vascular evaluation if no significant improvement.        Relevant Medications   cefadroxil (DURICEF) 500 MG capsule   doxycycline (VIBRA-TABS) 100 MG tablet   Mood swings    Mr. Teal has been having increased levels of mood swings and irritability which is concerning for possible underlying mental health disorder. Anxiety currently stable. Will continue current dose of Effexor and send referral for mental health evaluation with Psychiatry which he is in agreement with. Advised to seek emergent care if symptoms acutely worsen or thoughts of suicide develop.          I am having Finnian Husted. Markowicz start on cefadroxil and doxycycline. I am also having him maintain his Symtuza, Tivicay, gabapentin, omeprazole, valACYclovir, albuterol, venlafaxine XR, and sildenafil.   Meds ordered this encounter  Medications   cefadroxil (DURICEF) 500 MG capsule    Sig: Take 1 capsule (500 mg total) by mouth 2 (two) times daily.    Dispense:  28 capsule    Refill:  0    Order Specific Question:   Supervising Provider    Answer:   Drue Second, CYNTHIA [[id number]]   doxycycline (VIBRA-TABS) 100 MG tablet    Sig: Take 1 tablet (100 mg total) by mouth 2 (two) times daily.    Dispense:  28 tablet    Refill:  0    Order Specific Question:   Supervising Provider    Answer:   Judyann Munson [[id number]]     Follow-up: Return in about 1 month (around 11/24/2020), or if symptoms worsen or fail to improve.   Marcos Eke, MSN, FNP-C Nurse Practitioner [facility] for Infectious Disease [facility] Main number: [phone]

## 2020-10-24 NOTE — Assessment & Plan Note (Signed)
[name] appears to have adequately controlled virus with good adherence and tolerance to [place] and Tivicay. No signs/symptoms of opportunistic infection. We reviewed previous lab work and discussed plan of care. Check lab work today. Continue current dose of Symtuza and Tivicay. Plan for follow up in 1 month or sooner if needed.

## 2020-10-24 NOTE — Patient Instructions (Addendum)
Nice to see you.   We will check your lab work today.  Continue to take your medications daily as prescribed.  Take the 2 antibiotics until completed.   Clean daily with soap and water. No need for triple antibiotic ointment.   Plan for follow up in 1 month or sooner if needed.  Have a great day and stay safe!

## 2020-10-24 NOTE — Assessment & Plan Note (Signed)
[name] appears to have cellulitis of his left lower extremity. Cannot rule out possibility of vascular issues although he notes it started after wearing a boot. Recommend cleansing with soap and water and daily dressing change. Will treat with 14 days of cefodroxil and doxycycline as he may have interactions with Effexor and Zyvox. Plan for follow up in 1 month or sooner if needed. Consider ABI and vascular evaluation if no significant improvement.

## 2020-10-25 ENCOUNTER — Other Ambulatory Visit: Payer: Self-pay | Admitting: Family

## 2020-10-25 DIAGNOSIS — N529 Male erectile dysfunction, unspecified: Secondary | ICD-10-CM

## 2020-10-26 LAB — T-HELPER CELL (CD4) - (RCID CLINIC ONLY)
CD4 % Helper T Cell: 20 % — ABNORMAL LOW (ref 33–65)
CD4 T Cell Abs: 349 /uL — ABNORMAL LOW (ref 400–1790)

## 2020-10-30 LAB — COMPLETE METABOLIC PANEL WITH GFR
AG Ratio: 1.4 (calc) (ref 1.0–2.5)
ALT: 32 U/L (ref 9–46)
AST: 31 U/L (ref 10–35)
Albumin: 4.2 g/dL (ref 3.6–5.1)
Alkaline phosphatase (APISO): 61 U/L (ref 35–144)
BUN: 12 mg/dL (ref 7–25)
CO2: 21 mmol/L (ref 20–32)
Calcium: 9.2 mg/dL (ref 8.6–10.3)
Chloride: 105 mmol/L (ref 98–110)
Creat: 1.08 mg/dL (ref 0.70–1.30)
Globulin: 3 g/dL (calc) (ref 1.9–3.7)
Glucose, Bld: 89 mg/dL (ref 65–99)
Potassium: 3.8 mmol/L (ref 3.5–5.3)
Sodium: 136 mmol/L (ref 135–146)
Total Bilirubin: 0.4 mg/dL (ref 0.2–1.2)
Total Protein: 7.2 g/dL (ref 6.1–8.1)
eGFR: 82 mL/min/{1.73_m2} (ref >=60)

## 2020-10-30 LAB — HIV-1 RNA QUANT-NO REFLEX-BLD
HIV 1 RNA Quant: <20 Copies/mL — ABNORMAL HIGH
HIV-1 RNA Quant, Log: <1.3 Log cps/mL — ABNORMAL HIGH

## 2020-11-05 LAB — HIV-1 RNA QUANT-NO REFLEX-BLD: HIV 1 RNA Quant: <40

## 2020-11-07 ENCOUNTER — Encounter: Payer: Self-pay | Admitting: *Deleted

## 2020-11-12 ENCOUNTER — Other Ambulatory Visit: Payer: Self-pay

## 2020-11-12 ENCOUNTER — Encounter (INDEPENDENT_AMBULATORY_CARE_PROVIDER_SITE_OTHER): Payer: Self-pay | Admitting: *Deleted

## 2020-11-12 VITALS — BP 138/88 | HR 57 | Temp 97.6°F | Wt 217.5 lb

## 2020-11-12 DIAGNOSIS — Z21 Asymptomatic human immunodeficiency virus [HIV] infection status: Secondary | ICD-10-CM

## 2020-11-12 DIAGNOSIS — Z006 Encounter for examination for normal comparison and control in clinical research program: Secondary | ICD-10-CM

## 2020-11-12 NOTE — Research (Signed)
[name] here for his week 4 visit for [phone], [employer] study. Denied any site reactions or adverse effects after his last injection. States that his appetite has improved and that he has been having less nausea and vomiting in the mornings. No new complaints or mediations. He received his 2nd Heplisav-B injection today given in his (R) deltoid without problem. He will return in September for his next study visit.

## 2020-11-13 LAB — HEPATIC FUNCTION PANEL
AG Ratio: 1.5 (calc) (ref 1.0–2.5)
ALT: 36 U/L (ref 9–46)
AST: 33 U/L (ref 10–35)
Albumin: 4.3 g/dL (ref 3.6–5.1)
Alkaline phosphatase (APISO): 60 U/L (ref 35–144)
Bilirubin, Direct: 0.1 mg/dL (ref 0.0–0.2)
Globulin: 2.9 g/dL (calc) (ref 1.9–3.7)
Indirect Bilirubin: 0.4 mg/dL (calc) (ref 0.2–1.2)
Total Bilirubin: 0.5 mg/dL (ref 0.2–1.2)
Total Protein: 7.2 g/dL (ref 6.1–8.1)

## 2020-11-13 LAB — CBC WITH DIFFERENTIAL/PLATELET
Absolute Monocytes: 443 cells/uL (ref 200–950)
Basophils Absolute: 30 cells/uL (ref 0–200)
Basophils Relative: 0.7 %
Eosinophils Absolute: 151 cells/uL (ref 15–500)
Eosinophils Relative: 3.5 %
HCT: 41.3 % (ref 38.5–50.0)
Hemoglobin: 14.5 g/dL (ref 13.2–17.1)
Lymphs Abs: 1492 cells/uL (ref 850–3900)
MCH: 32.4 pg (ref 27.0–33.0)
MCHC: 35.1 g/dL (ref 32.0–36.0)
MCV: 92.4 fL (ref 80.0–100.0)
MPV: 10.4 fL (ref 7.5–12.5)
Monocytes Relative: 10.3 %
Neutro Abs: 2184 cells/uL (ref 1500–7800)
Neutrophils Relative %: 50.8 %
Platelets: 176 10*3/uL (ref 140–400)
RBC: 4.47 10*6/uL (ref 4.20–5.80)
RDW: 14.6 % (ref 11.0–15.0)
Total Lymphocyte: 34.7 %
WBC: 4.3 10*3/uL (ref 3.8–10.8)

## 2020-11-28 ENCOUNTER — Ambulatory Visit: Payer: Medicare Other | Admitting: Family

## 2020-11-29 ENCOUNTER — Other Ambulatory Visit: Payer: Self-pay

## 2020-11-29 ENCOUNTER — Encounter: Payer: Self-pay | Admitting: Family

## 2020-11-29 ENCOUNTER — Ambulatory Visit (INDEPENDENT_AMBULATORY_CARE_PROVIDER_SITE_OTHER): Payer: Medicare Other | Admitting: Family

## 2020-11-29 VITALS — BP 152/103 | HR 69 | Temp 97.9°F | Wt 215.5 lb

## 2020-11-29 DIAGNOSIS — L03116 Cellulitis of left lower limb: Secondary | ICD-10-CM

## 2020-11-29 DIAGNOSIS — R4586 Emotional lability: Secondary | ICD-10-CM

## 2020-11-29 DIAGNOSIS — Z21 Asymptomatic human immunodeficiency virus [HIV] infection status: Secondary | ICD-10-CM

## 2020-11-29 MED ORDER — QUETIAPINE FUMARATE 25 MG PO TABS: 25.0000 mg | ORAL_TABLET | Freq: Every day | ORAL | 1 refills | Status: DC

## 2020-11-29 MED ORDER — FLUOXETINE HCL 20 MG PO CAPS: 20.0000 mg | ORAL_CAPSULE | Freq: Every day | ORAL | 2 refills | Status: DC

## 2020-11-29 NOTE — Progress Notes (Signed)
Subjective:    Patient ID: [name], male    DOB: Nov 21, 1965, 55 y.o.   MRN: [id number]  Chief Complaint  Patient presents with   Follow-up    B20 - pt reports his leg is doing a little better since taking abx. Pt reports outbursts are getting worse.     HPI:  [name] is a 55 y.o. male with HIV disease last seen on 10/24/2020 with well-controlled virus and good adherence and tolerance to his ART regimen of Symtuza.  Viral load was undetectable with CD4 count of 349.  Also noted to have a wound located on his left lower leg concerning for an infected venous stasis ulcer for which he was started on antibiotics.  He also continued to have mood disorders and anger outbursts.  Here today for routine follow-up.  [name] continues to take his Symtuza daily as prescribed with no adverse side effects.  He completed his course of doxycycline and Augmentin with significant improvement in his lower extremity.  Continues to have discoloration although wounds have healed.  Has concern about his mood as he tends to get angry easily and frustrated.  Previous medication did not appear to be helping.  Severity is affecting his personal relationships.  [name] has no problems obtaining medication from the pharmacy.  Continues to smoke marijuana and drinks alcohol on occasion and remains a former tobacco smoker.  Mood remains labile.  No suicidal ideations.  Would like to try to stabilize his mood.   Allergies  Allergen Reactions   Hydrocodone Other (See Comments)    "makes me overly sleepy"   Sulfa Antibiotics Rash   Tramadol Nausea Only      Outpatient Medications Prior to Visit  Medication Sig Dispense Refill   Darunavir-Cobicisctat-Emtricitabine-Tenofovir Alafenamide (SYMTUZA) 800-150-200-10 MG TABS Take 1 tablet by mouth daily with breakfast. 30 tablet 5   dolutegravir (TIVICAY) 50 MG tablet Take 1 tablet (50 mg total) by mouth daily. 30 tablet 5   gabapentin (NEURONTIN)  600 MG tablet Take 2 tablets (1,200 mg total) by mouth 3 (three) times daily. 180 tablet 3   cefadroxil (DURICEF) 500 MG capsule Take 1 capsule (500 mg total) by mouth 2 (two) times daily. 28 capsule 0   doxycycline (VIBRA-TABS) 100 MG tablet Take 1 tablet (100 mg total) by mouth 2 (two) times daily. 28 tablet 0   albuterol (VENTOLIN HFA) 108 (90 Base) MCG/ACT inhaler Inhale 2 puffs into the lungs every 6 (six) hours as needed for wheezing or shortness of breath. (Patient not taking: Reported on 11/29/2020) 54 g 5   omeprazole (PRILOSEC) 20 MG capsule Take 1 capsule (20 mg total) by mouth daily. (Patient not taking: Reported on 11/29/2020) 30 capsule 3   sildenafil (VIAGRA) 50 MG tablet TAKE 1/2 (ONE-HALF) TABLET BY MOUTH ONCE DAILY AS NEEDED (Patient not taking: Reported on 11/29/2020) 10 tablet 0   valACYclovir (VALTREX) 500 MG tablet Take 1 tablet (500 mg total) by mouth daily. (Patient not taking: Reported on 11/29/2020) 30 tablet 11   venlafaxine XR (EFFEXOR XR) 75 MG 24 hr capsule Take 1 capsule (75 mg total) by mouth daily with breakfast. (Patient not taking: Reported on 11/29/2020) 30 capsule 3   No facility-administered medications prior to visit.     Past Medical History:  Diagnosis Date   Anxiety    Arthritis    Asthma    Depression    History of Clostridium difficile colitis 04/22/2017   History of pulmonary  embolus (PE) 03/17/2013   Overview:  post hip surgery, on blood thinners at the time   HIV (human immunodeficiency virus infection) (HCC)    Hypokalemia    Hypotension 01/25/2015   Immune deficiency disorder (HCC)    Infectious folliculitis 01/30/2017   MRSA (methicillin resistant staph aureus) culture positive    Sepsis (HCC)    Streptococcal pneumonia [facility])      Past Surgical History:  Procedure Laterality Date   hip arthroplasty Left 2016   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, I&D with replacement in 2017   TOTAL HIP ARTHROPLASTY Right 02/09/2018   Procedure: TOTAL  HIP ARTHROPLASTY ANTERIOR APPROACH;  Surgeon: Durene Romans, MD;  Location: [facility];  Service: Orthopedics;  Laterality: Right;  70 mins       Review of Systems  Constitutional:  Negative for appetite change, chills, fatigue, fever and unexpected weight change.  Eyes:  Negative for visual disturbance.  Respiratory:  Negative for cough, chest tightness, shortness of breath and wheezing.   Cardiovascular:  Negative for chest pain and leg swelling.  Gastrointestinal:  Negative for abdominal pain, constipation, diarrhea, nausea and vomiting.  Genitourinary:  Negative for dysuria, flank pain, frequency, genital sores, hematuria and urgency.  Skin:  Negative for rash.  Allergic/Immunologic: Negative for immunocompromised state.  Neurological:  Negative for dizziness and headaches.  Psychiatric/Behavioral:  Positive for agitation and behavioral problems. Negative for self-injury and suicidal ideas. The patient is not nervous/anxious.      Objective:    BP (!) 152/103   Pulse 69   Temp 97.9 F (36.6 C) (Oral)   Wt 215 lb 8 oz (97.8 kg)   SpO2 98%   BMI 27.67 kg/m  Nursing note and vital signs reviewed.  Physical Exam Constitutional:      General: He is not in acute distress.    Appearance: He is well-developed.  Cardiovascular:     Rate and Rhythm: Normal rate and regular rhythm.     Heart sounds: Normal heart sounds.  Pulmonary:     Effort: Pulmonary effort is normal.     Breath sounds: Normal breath sounds.  Skin:    General: Skin is warm and dry.  Neurological:     Mental Status: He is alert and oriented to person, place, and time.  Psychiatric:        Behavior: Behavior normal.        Thought Content: Thought content normal.        Judgment: Judgment normal.     Depression screen [facility] 2/9 04/05/2018 02/08/2018 05/04/2017  Decreased Interest 0 0 0  Down, Depressed, Hopeless 0 0 0  PHQ - 2 Score 0 0 0       Assessment & Plan:    Patient Active Problem List    Diagnosis Date Noted   HIV (human immunodeficiency virus infection) (HCC) 01/24/2015    Priority: High   Cellulitis 10/24/2020   Mood swings 10/24/2020   Erectile dysfunction 04/11/2019   Generalized anxiety disorder with panic attacks 10/25/2018   Anxiety about health 10/25/2018   Healthcare maintenance 07/06/2018   Numbness of right lower extremity 07/06/2018   Heartburn 07/06/2018   S/P right THA, AA 02/09/2018   S/P hip replacement 02/09/2018   Essential hypertension    Lung abscess (HCC) 06/21/2017   Lung nodule, multiple 06/14/2017   Asthma 04/22/2017   Folliculitis 01/30/2017   Complication of implanted device 07/24/2015   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 Sheldon continues to take his Symtuza daily as prescribed with no adverse side effects.  No signs/symptoms of opportunistic infection.  Reviewed previous lab work and discussed plan of care.  Continue current dose of Symtuza.  Plan for follow-up in 3 months or sooner if needed.      Cellulitis    Mr. Raffield completed his course of antibiotics for suspected cellulitis likely secondary to venous insufficiency.  Recommend establishing with internal medicine for further evaluation of vascular status.  No further treatment necessary at this time.  Continue to monitor.      Mood swings    Mr. Reigle continues to have mood swings including frustration/anger at times.  No suicidal ideations or signs of psychosis.  There is concern for bipolar amongst other underlying psychiatric problems.  Recommended counseling and will place referral  for psychiatric evaluation with resources provided.  Start low-dose Seroquel and fluoxetine.  Advised if symptoms worsen or thoughts of suicide develop to seek further care immediately.      Relevant Medications   QUEtiapine (SEROQUEL) 25 MG tablet   FLUoxetine (PROZAC) 20 MG capsule   Other Relevant Orders   Ambulatory referral to Psychiatry     I have discontinued Theophilus Kinds. Lucero's venlafaxine XR, cefadroxil, and doxycycline. I am also having him start on QUEtiapine and FLUoxetine. Additionally, I am having him maintain his Symtuza, Tivicay, gabapentin, omeprazole, valACYclovir, albuterol, and sildenafil.   Meds ordered this encounter  Medications   QUEtiapine (SEROQUEL) 25 MG tablet    Sig: Take 1 tablet (25 mg total) by mouth at bedtime.    Dispense:  30 tablet    Refill:  1    Order Specific Question:   Supervising Provider    Answer:   Drue Second, CYNTHIA [[id number]]   FLUoxetine (PROZAC) 20 MG capsule    Sig: Take 1 capsule (20 mg total) by mouth daily.    Dispense:  30 capsule    Refill:  2    Order Specific Question:   Supervising Provider    Answer:   Judyann Munson [[id number]]     Follow-up: Return in about 3 months (around 02/28/2021), or if symptoms worsen or fail to improve.   Marcos Eke, MSN, FNP-C Nurse Practitioner [facility] Main number: [phone]

## 2020-11-29 NOTE — Patient Instructions (Addendum)
Nice to see you.   Continue to take your medications as prescribed.  Refills have been sent to the pharmacy.   Start the Seroquel and Prozac.  Recommend establish with primary care/internal medicine with the [facility] at [phone].   Plan for follow up in 3 months or sooner if needed with lab work on the same day.  Have a great day and stay safe!

## 2020-11-30 NOTE — Assessment & Plan Note (Signed)
Mr. Korol completed his course of antibiotics for suspected cellulitis likely secondary to venous insufficiency.  Recommend establishing with internal medicine for further evaluation of vascular status.  No further treatment necessary at this time.  Continue to monitor.

## 2020-11-30 NOTE — Assessment & Plan Note (Signed)
[name] continues to take his Symtuza daily as prescribed with no adverse side effects.  No signs/symptoms of opportunistic infection.  Reviewed previous lab work and discussed plan of care.  Continue current dose of Symtuza.  Plan for follow-up in 3 months or sooner if needed.

## 2020-11-30 NOTE — Assessment & Plan Note (Signed)
[name] continues to have mood swings including frustration/anger at times.  No suicidal ideations or signs of psychosis.  There is concern for bipolar amongst other underlying psychiatric problems.  Recommended counseling and will place referral for psychiatric evaluation with resources provided.  Start low-dose Seroquel and fluoxetine.  Advised if symptoms worsen or thoughts of suicide develop to seek further care immediately.

## 2020-12-11 ENCOUNTER — Encounter: Payer: Self-pay | Admitting: *Deleted

## 2020-12-11 ENCOUNTER — Other Ambulatory Visit: Payer: Self-pay

## 2020-12-11 VITALS — BP 158/89 | HR 64 | Temp 98.1°F | Resp 16 | Wt 220.6 lb

## 2020-12-11 DIAGNOSIS — F410 Panic disorder [episodic paroxysmal anxiety] without agoraphobia: Secondary | ICD-10-CM

## 2020-12-11 DIAGNOSIS — B20 Human immunodeficiency virus [HIV] disease: Secondary | ICD-10-CM

## 2020-12-11 DIAGNOSIS — Z006 Encounter for examination for normal comparison and control in clinical research program: Secondary | ICD-10-CM

## 2020-12-11 DIAGNOSIS — R4586 Emotional lability: Secondary | ICD-10-CM

## 2020-12-11 MED ORDER — QUETIAPINE FUMARATE 50 MG PO TABS: 50.0000 mg | ORAL_TABLET | Freq: Every day | ORAL | 2 refills | Status: DC

## 2020-12-12 LAB — CBC WITH DIFFERENTIAL/PLATELET
Absolute Monocytes: 473 cells/uL (ref 200–950)
Basophils Absolute: 21 cells/uL (ref 0–200)
Basophils Relative: 0.4 %
Eosinophils Absolute: 213 cells/uL (ref 15–500)
Eosinophils Relative: 4.1 %
HCT: 38.9 % (ref 38.5–50.0)
Hemoglobin: 13.2 g/dL (ref 13.2–17.1)
Lymphs Abs: 1695 cells/uL (ref 850–3900)
MCH: 31.7 pg (ref 27.0–33.0)
MCHC: 33.9 g/dL (ref 32.0–36.0)
MCV: 93.3 fL (ref 80.0–100.0)
MPV: 9.7 fL (ref 7.5–12.5)
Monocytes Relative: 9.1 %
Neutro Abs: 2798 cells/uL (ref 1500–7800)
Neutrophils Relative %: 53.8 %
Platelets: 157 10*3/uL (ref 140–400)
RBC: 4.17 10*6/uL — ABNORMAL LOW (ref 4.20–5.80)
RDW: 13.2 % (ref 11.0–15.0)
Total Lymphocyte: 32.6 %
WBC: 5.2 10*3/uL (ref 3.8–10.8)

## 2020-12-12 LAB — HEPATIC FUNCTION PANEL
AG Ratio: 1.5 (calc) (ref 1.0–2.5)
ALT: 33 U/L (ref 9–46)
AST: 30 U/L (ref 10–35)
Albumin: 4.1 g/dL (ref 3.6–5.1)
Alkaline phosphatase (APISO): 69 U/L (ref 35–144)
Bilirubin, Direct: 0.1 mg/dL (ref 0.0–0.2)
Globulin: 2.8 g/dL (calc) (ref 1.9–3.7)
Indirect Bilirubin: 0.4 mg/dL (calc) (ref 0.2–1.2)
Total Bilirubin: 0.5 mg/dL (ref 0.2–1.2)
Total Protein: 6.9 g/dL (ref 6.1–8.1)

## 2021-01-02 ENCOUNTER — Encounter: Payer: Self-pay | Admitting: *Deleted

## 2021-01-02 ENCOUNTER — Other Ambulatory Visit: Payer: Self-pay

## 2021-01-02 VITALS — BP 148/102 | HR 68 | Temp 98.3°F | Wt 217.1 lb

## 2021-01-02 DIAGNOSIS — Z006 Encounter for examination for normal comparison and control in clinical research program: Secondary | ICD-10-CM

## 2021-01-02 NOTE — Research (Signed)
[name] here today for his week 12 visit for A5379, the [employer] study. Cellulitis to (L) lower leg improving per patient. Skin to area discolored, no drainage noted. Did note some excoriated areas where has scratched it. States that he has been applying Cerave lotion to the area with some relief from itching.  Complaining of some lower back pain. States that he fell off the back of his truck about a month ago while doing some yard work. Did not seek evaluation/care at the time but plans to schedule an appointment with his orthopedist for evaluation since he is continuing to have pain/discomfort. He has used ibuprofen for the discomfort with some relief.  BP was elevated today, 148/102. Had him rest for an additional 5 minutes and recheck his BP, 153/100. Denied headache, dizziness or visual changes. He plans to check his BP at home and will keep a diary. He is scheduled to see Marcos Eke, NP in December and will bring his BP readings in at that time. We did discuss the need to establish care with a PCP. I provided him with the numbers to [facility] and [facility]. He agreed to call to set up an appointment at one of these clinics. He will return in January for his next study visit.

## 2021-01-06 ENCOUNTER — Other Ambulatory Visit: Payer: Self-pay | Admitting: Family

## 2021-01-06 DIAGNOSIS — R12 Heartburn: Secondary | ICD-10-CM

## 2021-01-07 NOTE — Telephone Encounter (Signed)
Okay to refill? 

## 2021-01-15 NOTE — Telephone Encounter (Signed)
Okay to refill? 

## 2021-01-24 DIAGNOSIS — Z20822 Contact with and (suspected) exposure to covid-19: Secondary | ICD-10-CM | POA: Diagnosis not present

## 2021-02-11 ENCOUNTER — Telehealth: Payer: Self-pay

## 2021-02-11 DIAGNOSIS — R200 Anesthesia of skin: Secondary | ICD-10-CM

## 2021-02-11 MED ORDER — GABAPENTIN 600 MG PO TABS: 1200.0000 mg | ORAL_TABLET | Freq: Three times a day (TID) | ORAL | 3 refills | Status: DC

## 2021-02-11 NOTE — Addendum Note (Signed)
Addended by: Jeanine Luz D on: 02/11/2021 01:09 PM   Modules accepted: Orders

## 2021-02-11 NOTE — Telephone Encounter (Signed)
Patient called office requesting refill on Gabapentin rx. Patient would also like to inform provider that he had covid exposure around thanksgiving. Is currently experiencing SOB. Is using inhalers and over the counter medication to help with symptoms. Has not tested for covid yet. Advised he get tested today. Will forward message to FNP regarding refill and concerns for covid infection.  Juanita Laster, RMA

## 2021-02-11 NOTE — Telephone Encounter (Signed)
Gabapentin refilled. Agree with Covid testing at this point and symptomatic care as needed.

## 2021-02-11 NOTE — Telephone Encounter (Signed)
Called patient to inform him refills for Gabapentin have been sent to [facility]. Also encouraged patient to get tested and call back with results if positive. Understands if SOB gets worse to go to ED. Juanita Laster, RMA

## 2021-02-21 ENCOUNTER — Ambulatory Visit: Payer: Medicare Other | Admitting: Family

## 2021-02-26 ENCOUNTER — Ambulatory Visit (INDEPENDENT_AMBULATORY_CARE_PROVIDER_SITE_OTHER): Payer: Medicare Other | Admitting: Infectious Diseases

## 2021-02-26 ENCOUNTER — Ambulatory Visit (HOSPITAL_BASED_OUTPATIENT_CLINIC_OR_DEPARTMENT_OTHER): Payer: Medicare Other | Admitting: Psychiatry

## 2021-02-26 ENCOUNTER — Other Ambulatory Visit: Payer: Self-pay

## 2021-02-26 ENCOUNTER — Encounter: Payer: Self-pay | Admitting: Infectious Diseases

## 2021-02-26 VITALS — BP 126/77 | HR 76 | Temp 97.8°F | Wt 224.0 lb

## 2021-02-26 DIAGNOSIS — R4586 Emotional lability: Secondary | ICD-10-CM | POA: Diagnosis not present

## 2021-02-26 DIAGNOSIS — I872 Venous insufficiency (chronic) (peripheral): Secondary | ICD-10-CM | POA: Diagnosis not present

## 2021-02-26 DIAGNOSIS — Z23 Encounter for immunization: Secondary | ICD-10-CM | POA: Diagnosis not present

## 2021-02-26 DIAGNOSIS — F1914 Other psychoactive substance abuse with psychoactive substance-induced mood disorder: Secondary | ICD-10-CM | POA: Diagnosis not present

## 2021-02-26 DIAGNOSIS — R200 Anesthesia of skin: Secondary | ICD-10-CM

## 2021-02-26 DIAGNOSIS — I83029 Varicose veins of left lower extremity with ulcer of unspecified site: Secondary | ICD-10-CM | POA: Diagnosis not present

## 2021-02-26 DIAGNOSIS — F332 Major depressive disorder, recurrent severe without psychotic features: Secondary | ICD-10-CM

## 2021-02-26 DIAGNOSIS — B20 Human immunodeficiency virus [HIV] disease: Secondary | ICD-10-CM | POA: Diagnosis not present

## 2021-02-26 DIAGNOSIS — Z21 Asymptomatic human immunodeficiency virus [HIV] infection status: Secondary | ICD-10-CM

## 2021-02-26 DIAGNOSIS — L0390 Cellulitis, unspecified: Secondary | ICD-10-CM | POA: Diagnosis not present

## 2021-02-26 DIAGNOSIS — A46 Erysipelas: Secondary | ICD-10-CM

## 2021-02-26 DIAGNOSIS — J4520 Mild intermittent asthma, uncomplicated: Secondary | ICD-10-CM

## 2021-02-26 DIAGNOSIS — Z0000 Encounter for general adult medical examination without abnormal findings: Secondary | ICD-10-CM

## 2021-02-26 DIAGNOSIS — L97929 Non-pressure chronic ulcer of unspecified part of left lower leg with unspecified severity: Secondary | ICD-10-CM | POA: Diagnosis not present

## 2021-02-26 MED ORDER — QUETIAPINE FUMARATE 50 MG PO TABS: ORAL_TABLET | ORAL | 2 refills | Status: DC

## 2021-02-26 MED ORDER — PREDNISONE 10 MG PO TABS: 20.0000 mg | ORAL_TABLET | Freq: Every day | ORAL | 0 refills | Status: AC

## 2021-02-26 MED ORDER — GABAPENTIN 600 MG PO TABS: 1200.0000 mg | ORAL_TABLET | Freq: Three times a day (TID) | ORAL | 3 refills | Status: DC

## 2021-02-26 MED ORDER — FLUOXETINE HCL 20 MG PO CAPS: ORAL_CAPSULE | ORAL | 2 refills | Status: DC

## 2021-02-26 MED ORDER — DOXYCYCLINE HYCLATE 100 MG PO TABS: 100.0000 mg | ORAL_TABLET | Freq: Two times a day (BID) | ORAL | 0 refills | Status: DC

## 2021-02-26 MED ORDER — AMOXICILLIN-POT CLAVULANATE 875-125 MG PO TABS: 1.0000 | ORAL_TABLET | Freq: Two times a day (BID) | ORAL | 0 refills | Status: AC

## 2021-02-26 MED ORDER — TRIAMCINOLONE ACETONIDE 0.5 % EX OINT: 1.0000 "application " | TOPICAL_OINTMENT | Freq: Two times a day (BID) | CUTANEOUS | 3 refills | Status: DC

## 2021-02-26 NOTE — Progress Notes (Signed)
Subjective:    Patient ID: [name], male    DOB: [birth year], 55 y.o.   MRN: [id number]   Chief Complaint  Patient presents with   Follow-up    B20     HPI:  [name] is a 55 y.o. male with HIV disease last seen on 10/24/2020 with well-controlled virus and good adherence and tolerance to his ART regimen of Symtuza + Tivicay   Here with his partner/SO.  Multiple concerns today - leg infection, mental health meds not working well enough, and wheezing/breathing.   LOV with Marya Amsler in September 2022 - completed a course of antibiotics for cellulitis involving a venous stasis ulcer. This helped get rid of a majority of the redness and all of the pain. (10-d course Doxy/Augmentin). Since completion of this he has had a return recently of the stinging/throbbing pain, swelling and now redness. He has not had any fevers or chills. He sometimes covers this with a bandage but a majority of the time it remains open. He has struggled with these ulcers/rashes on the left lower leg for > 6 months. In between flares of infection he states it is very ithchy. He also has a small new spot on the lateral aspect of the RLE above his ankle.  Mr. Alltop continues to take his Symtuza daily as prescribed with no adverse side effects.    Establishing care with Dr. Casimiro Needle for mental health treatments - he states his current regimen is not working for him.   He has COPD and has noticed an increase in wheezing and cough of late. He does have some clear/white mucus produced at times. Not smoking. Inhalers work intermittently but they cause him to get hyper and anxious, so he uses over the counter prematine tablets.   He has not been able to get his gabapentin rx and needs this refilled.    Allergies  Allergen Reactions   Hydrocodone Other (See Comments)    "makes me overly sleepy"   Sulfa Antibiotics Rash   Tramadol Nausea Only      Outpatient Medications Prior to Visit  Medication Sig  Dispense Refill   albuterol (VENTOLIN HFA) 108 (90 Base) MCG/ACT inhaler Inhale 2 puffs into the lungs every 6 (six) hours as needed for wheezing or shortness of breath. 54 g 5   Darunavir-Cobicisctat-Emtricitabine-Tenofovir Alafenamide (SYMTUZA) 800-150-200-10 MG TABS Take 1 tablet by mouth daily with breakfast. 30 tablet 5   dolutegravir (TIVICAY) 50 MG tablet Take 1 tablet (50 mg total) by mouth daily. 30 tablet 5   ePHEDrine HCl 12.5 MG TABS Take 1 tablet by mouth 2 (two) times daily as needed.     omeprazole (PRILOSEC) 20 MG capsule TAKE 1 CAPSULE(20 MG) BY MOUTH DAILY 90 capsule 0   sildenafil (VIAGRA) 50 MG tablet TAKE 1/2 (ONE-HALF) TABLET BY MOUTH ONCE DAILY AS NEEDED 10 tablet 0   valACYclovir (VALTREX) 500 MG tablet Take 1 tablet (500 mg total) by mouth daily. 30 tablet 11   venlafaxine XR (EFFEXOR-XR) 37.5 MG 24 hr capsule Take 37.5 mg by mouth daily.     FLUoxetine (PROZAC) 20 MG capsule Take 1 capsule (20 mg total) by mouth daily. 30 capsule 2   gabapentin (NEURONTIN) 600 MG tablet Take 2 tablets (1,200 mg total) by mouth 3 (three) times daily. 180 tablet 3   QUEtiapine (SEROQUEL) 50 MG tablet Take 1 tablet (50 mg total) by mouth at bedtime. 30 tablet 2   No facility-administered medications prior to visit.  Past Medical History:  Diagnosis Date   Anxiety    Arthritis    Asthma    Depression    History of Clostridium difficile colitis 04/22/2017   History of pulmonary embolus (PE) 03/17/2013   Overview:  post hip surgery, on blood thinners at the time   HIV (human immunodeficiency virus infection) ([place])    Hypokalemia    Hypotension 01/25/2015   Immune deficiency disorder ([place])    Infectious folliculitis 99991111   MRSA (methicillin resistant staph aureus) culture positive    Sepsis ([place])    Streptococcal pneumonia [facility])      Past Surgical History:  Procedure Laterality Date   hip arthroplasty Left 2016   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  HENT:  Positive for congestion.   Eyes:  Negative for visual disturbance.  Respiratory:  Positive for cough and wheezing. Negative for chest tightness and shortness of breath.   Cardiovascular:  Negative for chest pain and leg swelling.  Gastrointestinal:  Negative for abdominal pain, constipation, diarrhea, nausea and vomiting.  Genitourinary:  Negative for dysuria, flank pain, frequency, genital sores, hematuria and urgency.  Skin:  Positive for color change, rash and wound.  Allergic/Immunologic: Negative for immunocompromised state.  Neurological:  Negative for dizziness and headaches.  Psychiatric/Behavioral:  Positive for agitation and behavioral problems. Negative for self-injury and suicidal ideas. The patient is not nervous/anxious.      Objective:    BP 126/77    Pulse 76    Temp 97.8 F (36.6 C) (Oral)    Wt 224 lb (101.6 kg)    BMI 28.76 kg/m  Nursing note and vital signs reviewed.  Physical Exam Constitutional:      General: He is not in acute distress.    Appearance: He is well-developed.  Cardiovascular:     Rate and Rhythm: Normal rate and regular rhythm.     Heart sounds: Normal heart sounds.  Pulmonary:     Effort: Pulmonary effort is normal.     Breath sounds: Normal breath sounds.  Skin:    General: Skin is warm and dry.  Neurological:     Mental Status: He is alert and oriented to person, place, and time.  Psychiatric:        Behavior: Behavior normal.        Thought Content: Thought content normal.        Judgment: Judgment normal.   LLE - well demarcated erythema expanding from central stasis changes, greater  than previously docucmented photos. There is warmth and tenderness present. Swelling and serous drainage from some open areas. Small ulcerations that appear to go no deeper than adipose layer.     Depression screen [facility] 2/9 02/26/2021 04/05/2018 02/08/2018 05/04/2017  Decreased Interest 0 0 0 0  Down, Depressed, Hopeless 1 0 0 0  PHQ - 2 Score 1 0 0 0       Assessment & Plan:    Patient Active Problem List   Diagnosis Date Noted   Erysipelas of left lower extremity 10/24/2020   Mood swings 10/24/2020   Erectile dysfunction 04/11/2019   Generalized anxiety disorder with panic attacks 10/25/2018  Anxiety about health 10/25/2018   Healthcare maintenance 07/06/2018   Numbness of right lower extremity 07/06/2018   Heartburn 07/06/2018   S/P right THA, AA 02/09/2018   S/P hip replacement 02/09/2018   Essential hypertension    Lung abscess (HCC) 06/21/2017   Lung nodule, multiple 06/14/2017   Asthma 04/22/2017   Complication of implanted device 07/24/2015   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/2015   Severe episode of recurrent major depressive disorder, without psychotic features (HCC) 04/27/2015   GERD (gastroesophageal reflux disease) 01/25/2015   HIV (human immunodeficiency virus infection) (HCC) 01/24/2015   Primary osteoarthritis of both hips 03/29/2013   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 He is aware of the apt.       Numbness of right lower extremity   Relevant Medications   gabapentin (NEURONTIN) 600 MG tablet   HIV (human immunodeficiency virus infection) (HCC)    VL is nice and low < 40 copies indicating excellent control on Symtuza + Tivicay regimen.   Continue both once daily together with food. FU with  [name] in 70m to follow up on other medical problems.       Relevant Orders   HIV-1 RNA quant-no reflex-bld (Completed)   Healthcare maintenance    Flu shot given today. Declined covid vaccines.       Erysipelas of left lower extremity - Primary    He does have signs of active infection today - will treat with short course of PO steroids (1/2 regular dose w/ Symtuza drug interaction) to help with pain/swelling. Will treat with same abx regimen used previously (Doxy + Augmentin) x 10d.   He has varicosities throughout both legs and small stasis ulcers. Counseled he should be using compression stockings to the leg to help decongest and heal wounds. Topical steroids for venous stasis dermatitis (b/l leg applications).   Given > 54m history will refer to wound clinic to help with topical treatments/wraps to help him heal this. May require vascular referral in the future.       Relevant Medications   doxycycline (VIBRA-TABS) 100 MG tablet   amoxicillin-clavulanate (AUGMENTIN) 875-125 MG tablet   Asthma    Flu shot today.  Counseled to add a daily antihistamine to see if this helps to keep him more comfortable. With current HIV regimen steroid inhalers pose a risk for adrenal insufficiency and would prefer to stay off them for now. May require pulm referral in the future if persistent symptoms.       Relevant Medications   ePHEDrine HCl 12.5 MG TABS   Other Visit Diagnoses     Venous stasis dermatitis of left lower extremity       Relevant Orders   Ambulatory referral to Wound Clinic   Venous ulcer of left leg (HCC)       Relevant Orders   Ambulatory referral to Wound Clinic   Need for immunization against influenza       Relevant Orders   Flu Vaccine QUAD 35mo+IM (Fluarix, Fluzone & Alfiuria Quad PF) (Completed)       I am having Theophilus Kinds. Pullara start on doxycycline, amoxicillin-clavulanate, predniSONE, and triamcinolone ointment. I am also having him maintain his [place], Tivicay,  valACYclovir, albuterol, sildenafil, omeprazole, venlafaxine XR, gabapentin, and ePHEDrine HCl.   Meds ordered this encounter  Medications   doxycycline (VIBRA-TABS) 100 MG tablet    Sig: Take 1 tablet (100 mg total) by mouth 2 (two) times daily.    Dispense:  20 tablet    Refill:  0    Order Specific Question:   Supervising Provider    Answer:   Thayer Headings [[id number]]   amoxicillin-clavulanate (AUGMENTIN) 875-125 MG tablet    Sig: Take 1 tablet by mouth 2 (two) times daily for 10 days.    Dispense:  20 tablet    Refill:  0    Order Specific Question:   Supervising Provider    Answer:   Thayer Headings [[id number]]   predniSONE (DELTASONE) 10 MG tablet    Sig: Take 2 tablets (20 mg total) by mouth daily with breakfast for 5 days.    Dispense:  10 tablet    Refill:  0    Order Specific Question:   Supervising Provider    Answer:   Thayer Headings [[id number]]   gabapentin (NEURONTIN) 600 MG tablet    Sig: Take 2 tablets (1,200 mg total) by mouth 3 (three) times daily.    Dispense:  180 tablet    Refill:  3    Order Specific Question:   Supervising Provider    Answer:   Thayer Headings [[id number]]   triamcinolone ointment (KENALOG) 0.5 %    Sig: Apply 1 application topically 2 (two) times daily.    Dispense:  30 g    Refill:  3    Order Specific Question:   Supervising Provider    Answer:   Thayer Headings [id number]     Follow-up: Return in about 2 months (around 05/06/2021).   Janene Madeira, MSN, NP-C [facility] [email or web address] Pager: [phone] Office: [phone] [place]: [phone]

## 2021-02-26 NOTE — Patient Instructions (Addendum)
For your lungs - I think that you should start an antihistamine once a day (zyrtec, claritin or xyzal are all good choices). These are all safe to take with your current medications. Give it a few weeks to see if this helps.  You may need a referral to pulmonology to help you breathe better.   [facility] [place]. [address]. [place], [place] [address]. [phone] for your appointment this afternoon.   Compression socks would be helpful for your leg to help remove some of the fluid in there for you.   Prednisone for 5 days - take two tabs in the morning  Antibiotics for 10 days - both taken twice a day.   Will place a referral for you to see wound care team with this leg wound lasting as long as it has.   Will send in a topical steroid cream also for the itchy spots   Please come back to see [name] in 2 months to follow up on these changes.

## 2021-02-27 ENCOUNTER — Encounter (HOSPITAL_BASED_OUTPATIENT_CLINIC_OR_DEPARTMENT_OTHER): Payer: Medicare Other | Attending: Physician Assistant | Admitting: Physician Assistant

## 2021-02-27 DIAGNOSIS — I872 Venous insufficiency (chronic) (peripheral): Secondary | ICD-10-CM | POA: Diagnosis not present

## 2021-02-27 DIAGNOSIS — L97822 Non-pressure chronic ulcer of other part of left lower leg with fat layer exposed: Secondary | ICD-10-CM | POA: Insufficient documentation

## 2021-02-27 NOTE — Progress Notes (Signed)
[name], [name] ([id number]) Visit Report for 02/27/2021 Abuse/Suicide Risk Screen Details Patient Name: Date of Service: [name], [name] 02/27/2021 1:15 PM Medical Record Number: [id number] Patient Account Number: [id number] Date of Birth/Sex: Treating RN: 1965/05/29 (55 y.o. [name] Primary Care [name]: PA [name], NO Other Clinician: Referring [name]: Treating [name]/Extender: [name] in Treatment: 0 Abuse/Suicide Risk Screen Items Answer ABUSE RISK SCREEN: Has anyone close to you tried to hurt or harm you recentlyo No Do you feel uncomfortable with anyone in your familyo No Has anyone forced you do things that you didnt want to doo No Electronic Signature(s) Signed: 02/27/2021 5:27:53 PM By: [name], [name] Entered By: [name] on 02/27/2021 13:27:22 -------------------------------------------------------------------------------- Activities of Daily Living Details Patient Name: Date of Service: [name], [name] 02/27/2021 1:15 PM Medical Record Number: [id number] Patient Account Number: [id number] Date of Birth/Sex: Treating RN: [birth year] (55 y.o. [name] Primary Care [name]: PA [name], NO Other Clinician: Referring [name]: Treating [name]/Extender: [name] in Treatment: 0 Activities of Daily Living Items Answer Activities of Daily Living (Please select one for each item) Drive Automobile Completely Able T Medications ake Completely Able Use T elephone Completely Able Care for Appearance Completely Able Use T oilet Completely Able Bath / Shower Completely Able Dress Self Completely Able Feed Self Completely Able Walk Completely Able Get In / Out Bed Completely Able Housework Completely Able Prepare Meals Completely Able Handle Money Completely Able Shop for Self Completely Able Electronic Signature(s) Signed: 02/27/2021 5:27:53 PM By: [name],  [name] Entered By: [name] on 02/27/2021 13:27:40 -------------------------------------------------------------------------------- Education Screening Details Patient Name: Date of Service: Joseph Drafts D. 02/27/2021 1:15 PM Medical Record Number: [id number] Patient Account Number: [id number] Date of Birth/Sex: Treating RN: 02/20/1966 (55 y.o. [name] Primary Care [name]: PA [name], NO Other Clinician: Referring [name]: Treating [name]/Extender: [name] in Treatment: 0 Primary Learner Assessed: Patient Learning Preferences/Education Level/Primary Language Learning Preference: Explanation, Demonstration, Printed Material Highest Education Level: High School Preferred Language: English Cognitive Barrier Language Barrier: No Translator Needed: No Memory Deficit: No Emotional Barrier: No Cultural/Religious Beliefs Affecting Medical Care: No Physical Barrier Impaired Vision: Yes Glasses, reading Impaired Hearing: No Decreased Hand dexterity: No Knowledge/Comprehension Knowledge Level: High Comprehension Level: High Ability to understand written instructions: High Ability to understand verbal instructions: High Motivation Anxiety Level: Calm Cooperation: Cooperative Education Importance: Acknowledges Need Interest in Health Problems: Asks Questions Perception: Coherent Willingness to Engage in Self-Management High Activities: Readiness to Engage in Self-Management High Activities: Electronic Signature(s) Signed: 02/27/2021 5:27:53 PM By: [name], [name] Entered By: [name] on 02/27/2021 13:28:05 -------------------------------------------------------------------------------- Fall Risk Assessment Details Patient Name: Date of Service: Joseph Drafts D. 02/27/2021 1:15 PM Medical Record Number: [id number] Patient Account Number: [id number] Date of Birth/Sex: Treating RN: April 10, 1965 (55 y.o. [name] Primary Care [name]: PA TIENT, NO Other Clinician: Referring [name]: Treating [name]/Extender: [name] in Treatment: 0 Fall Risk Assessment Items Have you had 2 or more falls in the last 12 monthso 0 No Have you had any fall that resulted in injury in the last 12 monthso 0 No FALLS RISK SCREEN History of falling - immediate or within 3 months 0 No Secondary diagnosis (Do you have 2 or more medical diagnoseso) 0 No Ambulatory aid None/bed rest/wheelchair/nurse 0 Yes Crutches/cane/walker 0 No Furniture 0 No Intravenous therapy Access/Saline/Heparin Lock 0 No Gait/Transferring  Normal/ bed rest/ wheelchair 0 Yes Weak (short steps with or without shuffle, stooped but able to lift head while walking, may seek 0 No support from furniture) Impaired (short steps with shuffle, may have difficulty arising from chair, head down, impaired 0 No balance) Mental Status Oriented to own ability 0 Yes Electronic Signature(s) Signed: 02/27/2021 5:27:53 PM By: [name], [name] Entered By: [name] on 02/27/2021 13:28:16 -------------------------------------------------------------------------------- Foot Assessment Details Patient Name: Date of Service: Joseph Drafts D. 02/27/2021 1:15 PM Medical Record Number: [id number] Patient Account Number: [id number] Date of Birth/Sex: Treating RN: [birth year] (56 y.o. [name] Primary Care [name]: PA TIENT, NO Other Clinician: Referring [name]: Treating [name]/Extender: [name] in Treatment: 0 Foot Assessment Items Site Locations + = Sensation present, - = Sensation absent, C = Callus, U = Ulcer R = Redness, W = Warmth, M = Maceration, PU = Pre-ulcerative lesion F = Fissure, S = Swelling, D = Dryness Assessment Right: Left: Other Deformity: No No Prior Foot Ulcer: No No Prior Amputation: No No Charcot Joint: No No Ambulatory Status: Ambulatory  Without Help Gait: Steady Electronic Signature(s) Signed: 02/27/2021 5:27:53 PM By: [name], [name] Entered By: [name] on 02/27/2021 13:48:45 -------------------------------------------------------------------------------- Nutrition Risk Screening Details Patient Name: Date of Service: Joseph Drafts D. 02/27/2021 1:15 PM Medical Record Number: [id number] Patient Account Number: [id number] Date of Birth/Sex: Treating RN: Aug 25, 1965 (55 y.o. [name] Primary Care Otie Headlee: PA TIENT, NO Other Clinician: Referring Jaelyne Deeg: Treating Raylei Losurdo/Extender: [name] in Treatment: 0 Height (in): 74 Weight (lbs): 226 Body Mass Index (BMI): 29 Nutrition Risk Screening Items Score Screening NUTRITION RISK SCREEN: I have an illness or condition that made me change the kind and/or amount of food I eat 2 Yes I eat fewer than two meals per day 0 No I eat few fruits and vegetables, or milk products 0 No I have three or more drinks of beer, liquor or wine almost every day 0 No I have tooth or mouth problems that make it hard for me to eat 0 No I don't always have enough money to buy the food I need 0 No I eat alone most of the time 0 No I take three or more different prescribed or over-the-counter drugs a day 1 Yes Without wanting to, I have lost or gained 10 pounds in the last six months 0 No I am not always physically able to shop, cook and/or feed myself 0 No Nutrition Protocols Good Risk Protocol Moderate Risk Protocol 0 Provide education on nutrition High Risk Proctocol Risk Level: Moderate Risk Score: 3 Electronic Signature(s) Signed: 02/27/2021 5:27:53 PM By: [name], [name] Entered By: [name] on 02/27/2021 13:28:34

## 2021-02-27 NOTE — Progress Notes (Addendum)
[name], [name] ([id number]) Visit Report for 02/27/2021 Allergy List Details Patient Name: Date of Service: [name] 02/27/2021 1:15 PM Medical Record Number: [id number] Patient Account Number: [id number] Date of Birth/Sex: Treating RN: 06/25/1965 (55 y.o. [name] Primary Care [name]: PA [name], NO Other Clinician: Referring [name]: Treating [name] Weeks in Treatment: 0 Allergies Active Allergies Sulfa (Sulfonamide Antibiotics) Reaction: rash Severity: Moderate hydrocodone Reaction: sleepy Severity: Mild tramadol Reaction: nausea Allergy Notes Electronic Signature(s) Signed: 02/27/2021 5:27:53 PM By: Joseph Stall RN, [name] Entered By: [name] on 02/27/2021 13:27:13 -------------------------------------------------------------------------------- Arrival Information Details Patient Name: Date of Service: Joseph Drafts D. 02/27/2021 1:15 PM Medical Record Number: [id number] Patient Account Number: [id number] Date of Birth/Sex: Treating RN: 08-10-1965 (55 y.o. [name] Primary Care Kingson Lohmeyer: PA [name], NO Other Clinician: Referring [name]: Treating [name]/Extender: [name] in Treatment: 0 Visit Information Patient Arrived: Ambulatory Arrival Time: 13:22 Accompanied By: girlfriend Transfer Assistance: None Patient Identification Verified: Yes Secondary Verification Process Completed: Yes Patient Requires Transmission-Based Precautions: No Patient Has Alerts: No Electronic Signature(s) Signed: 02/27/2021 5:27:53 PM By: Joseph Stall RN, [name] Entered By: [name] on 02/27/2021 13:24:26 -------------------------------------------------------------------------------- Clinic Level of Care Assessment Details Patient Name: Date of Service: [name] 02/27/2021 1:15 PM Medical Record Number: [id number] Patient Account Number: [id number] Date of  Birth/Sex: Treating RN: 03-Jul-1965 (55 y.o. [name] Primary Care Cari Burgo: PA [name], NO Other Clinician: Referring [name]: Treating [name]/Extender: [name] in Treatment: 0 Clinic Level of Care Assessment Items TOOL 1 Quantity Score X- 1 0 Use when EandM and Procedure is performed on INITIAL visit ASSESSMENTS - Nursing Assessment / Reassessment X- 1 20 General Physical Exam (combine w/ comprehensive assessment (listed just below) when performed on new pt. evals) X- 1 25 Comprehensive Assessment (HX, ROS, Risk Assessments, Wounds Hx, etc.) ASSESSMENTS - Wound and Skin Assessment / Reassessment X- 1 10 Dermatologic / Skin Assessment (not related to wound area) ASSESSMENTS - Ostomy and/or Continence Assessment and Care []  - 0 Incontinence Assessment and Management []  - 0 Ostomy Care Assessment and Management (repouching, etc.) PROCESS - Coordination of Care []  - 0 Simple Patient / Family Education for ongoing care X- 1 20 Complex (extensive) Patient / Family Education for ongoing care X- 1 10 Staff obtains , Records, T Results / Process Orders est []  - 0 Staff telephones HHA, Nursing Homes / Clarify orders / etc []  - 0 Routine Transfer to another Facility (non-emergent condition) []  - 0 Routine Hospital Admission (non-emergent condition) X- 1 15 New Admissions / / Ordering NPWT Apligraf, etc. , []  - 0 Emergency Hospital Admission (emergent condition) PROCESS - Special Needs []  - 0 Pediatric / Minor Patient Management []  - 0 Isolation Patient Management []  - 0 Hearing / Language / Visual special needs []  - 0 Assessment of Community assistance (transportation, D/C planning, etc.) []  - 0 Additional assistance / Altered mentation []  - 0 Support Surface(s) Assessment (bed, cushion, seat, etc.) INTERVENTIONS - Miscellaneous []  - 0 External ear exam []  - 0 Patient Transfer (multiple staff /  / Similar devices) []  - 0 Simple Staple / Suture removal (25 or less) []  - 0 Complex Staple / Suture removal (26 or more) []  - 0 Hypo/Hyperglycemic Management (do not check if billed separately) X- 1 15 Ankle / Brachial Index (ABI) - do not check if billed separately Has the patient been seen at  the hospital within the last three years: Yes Total Score: 115 Level Of Care: New/Established - Level 3 Electronic Signature(s) Signed: 03/05/2021 5:06:43 PM By: Joseph Stall RN, [name] Signed: 03/05/2021 5:06:43 PM By: Joseph Stall RN, [name] Entered By: [name] on 03/05/2021 07:28:23 -------------------------------------------------------------------------------- Compression Therapy Details Patient Name: Date of Service: Joseph Drafts D. 02/27/2021 1:15 PM Medical Record Number: [id number] Patient Account Number: [id number] Date of Birth/Sex: Treating RN: 04-Apr-1965 (55 y.o. [name] Primary Care Alven Alverio: PA [name], NO Other Clinician: Referring [name]: Treating [name]/Extender: [name] in Treatment: 0 Compression Therapy Performed for Wound Assessment: Wound #1 Left,Anterior Lower Leg Performed By: Clinician Joseph Stall, RN Compression Type: Three Layer Post Procedure Diagnosis Same as Pre-procedure Electronic Signature(s) Signed: 02/27/2021 5:27:53 PM By: Joseph Stall RN, [name] Entered By: [name] on 02/27/2021 14:17:42 -------------------------------------------------------------------------------- Encounter Discharge Information Details Patient Name: Date of Service: Joseph Drafts D. 02/27/2021 1:15 PM Medical Record Number: [id number] Patient Account Number: [id number] Date of Birth/Sex: Treating RN: 18-Jul-1965 (55 y.o. [name] Primary Care Shota [name]: PA [name], NO Other Clinician: Referring [name]: Treating [name]/Extender: [name] in Treatment: 0 Encounter  Discharge Information Items Discharge Condition: Stable Ambulatory Status: Ambulatory Discharge Destination: Home Transportation: Private Auto Accompanied By: self Schedule Follow-up Appointment: Yes Clinical Summary of Care: Electronic Signature(s) Signed: 02/27/2021 5:27:53 PM By: Joseph Stall RN, [name] Entered By: [name] on 02/27/2021 14:18:29 -------------------------------------------------------------------------------- Lower Extremity Assessment Details Patient Name: Date of Service: Joseph Drafts D. 02/27/2021 1:15 PM Medical Record Number: [id number] Patient Account Number: [id number] Date of Birth/Sex: Treating RN: [birth year] (55 y.o. [name] Primary Care Jakota Manthei: PA TIENT, NO Other Clinician: Referring Elowyn Raupp: Treating Dolora Ridgely/Extender: [name] Weeks in Treatment: 0 Edema Assessment Assessed: [Left: Yes] [Right: No] Edema: [Left: Ye] [Right: s] Calf Left: Right: Point of Measurement: 33 cm From Medial Instep 40 cm Ankle Left: Right: Point of Measurement: 11 cm From Medial Instep 23 cm Knee To Floor Left: Right: From Medial Instep 47 cm Vascular Assessment Pulses: Dorsalis Pedis Palpable: [Left:Yes] Doppler Audible: [Left:Yes] Posterior Tibial Palpable: [Left:Yes] Doppler Audible: [Left:Yes] Blood Pressure: Brachial: [Left:133] Ankle: [Left:Dorsalis Pedis: 130 0.98] Electronic Signature(s) Signed: 02/27/2021 5:27:53 PM By: Joseph Stall RN, [name] Entered By: [name] on 02/27/2021 13:48:16 -------------------------------------------------------------------------------- Multi-Disciplinary Care Plan Details Patient Name: Date of Service: Joseph Drafts D. 02/27/2021 1:15 PM Medical Record Number: [id number] Patient Account Number: [id number] Date of Birth/Sex: Treating RN: [birth year] (55 y.o. [name] Primary Care Julius Matus: PA [name], NO Other Clinician: Referring Ercie Eliasen: Treating  Pricilla Moehle/Extender: [name] in Treatment: 0 Active Inactive Electronic Signature(s) Signed: 04/22/2021 11:27:46 AM By: Joseph Stall RN, [name] Previous Signature: 02/27/2021 5:27:53 PM Version By: Joseph Stall RN, [name] Entered By: [name] on 04/22/2021 11:27:46 -------------------------------------------------------------------------------- Pain Assessment Details Patient Name: Date of Service: Joseph Drafts D. 02/27/2021 1:15 PM Medical Record Number: [id number] Patient Account Number: [id number] Date of Birth/Sex: Treating RN: February 23, 1966 (55 y.o. [name] Primary Care Kamauri Denardo: PA [name], NO Other Clinician: Referring Toniette Devera: Treating Gwynn Chalker/Extender: [name] in Treatment: 0 Active Problems Location of Pain Severity and Description of Pain Patient Has Paino Yes Site Locations Pain Location: Pain in Ulcers Rate the pain. Current Pain Level: 8 Worst Pain Level: 10 Least Pain Level: 0 Tolerable Pain Level: 8 Character of Pain Describe the Pain: Sharp, Shooting Pain Management and Medication Current Pain Management: Medication: No  Cold Application: No Rest: No Massage: No Activity: No T.E.N.S.: No Heat Application: No Leg drop or elevation: No Is the Current Pain Management Adequate: Adequate How does your wound impact your activities of daily livingo Sleep: Yes Bathing: No Appetite: No Relationship With Others: No Bladder Continence: No Emotions: No Bowel Continence: No Work: Yes Toileting: No Drive: No Dressing: No Hobbies: No Psychologist, prison and probation services) Signed: 02/27/2021 5:27:53 PM By: Joseph Stall RN, [name] Entered By: [name] on 02/27/2021 13:29:09 -------------------------------------------------------------------------------- Patient/Caregiver Education Details Patient Name: Date of Service: [name] 12/14/2022andnbsp1:15 PM Medical Record Number:  [id number] Patient Account Number: [id number] Date of Birth/Gender: Treating RN: 06/25/1965 (55 y.o. [name] Primary Care Physician: PA Fidela Juneau Other Clinician: Referring Physician: Treating Physician/Extender: [name] in Treatment: 0 Education Assessment Education Provided To: Patient and Caregiver Education Topics Provided Welcome [facility]: o Handouts: Welcome [facility] o Methods: Explain/Verbal, Printed Responses: Reinforcements needed Electronic Signature(s) Signed: 02/27/2021 5:27:53 PM By: Joseph Stall RN, [name] Entered By: [name] on 02/27/2021 14:14:18 -------------------------------------------------------------------------------- Wound Assessment Details Patient Name: Date of Service: Joseph Drafts D. 02/27/2021 1:15 PM Medical Record Number: [id number] Patient Account Number: [id number] Date of Birth/Sex: Treating RN: 1965/09/11 (55 y.o. [name] Primary Care Xiao Graul: PA TIENT, NO Other Clinician: Referring Chaniqua Brisby: Treating Elleana Stillson/Extender: [name] Weeks in Treatment: 0 Wound Status Wound Number: 1 Primary Etiology: Venous Leg Ulcer Wound Location: Left, Anterior Lower Leg Wound Status: Open Wounding Event: Gradually Appeared Comorbid History: Asthma, Hypertension, Hypotension, Osteoarthritis Date Acquired: 03/17/2020 Weeks Of Treatment: 0 Clustered Wound: No Photos Photo Uploaded By: Haywood Pao on 02/28/2021 09:12:44 Wound Measurements Length: (cm) 1 Width: (cm) 0.4 Depth: (cm) 0.1 Area: (cm) 0.314 Volume: (cm) 0.031 % Reduction in Area: % Reduction in Volume: Epithelialization: None Tunneling: No Undermining: No Wound Description Classification: Full Thickness Without Exposed Support Structures Wound Margin: Distinct, outline attached Exudate Amount: Medium Exudate Type: Serosanguineous Exudate Color: red, brown Foul Odor  After Cleansing: No Slough/Fibrino Yes Wound Bed Granulation Amount: Large (67-100%) Exposed Structure Granulation Quality: Red Fascia Exposed: No Necrotic Amount: Small (1-33%) Fat Layer (Subcutaneous Tissue) Exposed: Yes Necrotic Quality: Adherent Slough Tendon Exposed: No Muscle Exposed: No Joint Exposed: No Bone Exposed: No Assessment Notes rash like area to entire anterior portion of leg. Per patient weeps from anterior portion of leg as well. Electronic Signature(s) Signed: 02/27/2021 5:27:53 PM By: Joseph Stall RN, [name] Entered By: [name] on 02/27/2021 13:50:50 -------------------------------------------------------------------------------- Vitals Details Patient Name: Date of Service: Joseph Drafts D. 02/27/2021 1:15 PM Medical Record Number: [id number] Patient Account Number: [id number] Date of Birth/Sex: Treating RN: [birth year] (55 y.o. [name] Primary Care Emmer Lillibridge: PA TIENT, NO Other Clinician: Referring Xaviar Lunn: Treating Taunja Brickner/Extender: [name] in Treatment: 0 Vital Signs Time Taken: 13:20 Temperature (F): 98.3 Height (in): 74 Pulse (bpm): 84 Source: Stated Respiratory Rate (breaths/min): 20 Weight (lbs): 226 Blood Pressure (mmHg): 133/80 Source: Stated Reference Range: 80 - 120 mg / dl Body Mass Index (BMI): 29 Electronic Signature(s) Signed: 02/27/2021 5:27:53 PM By: Joseph Stall RN, [name] Entered By: [name] on 02/27/2021 13:26:03

## 2021-02-27 NOTE — Progress Notes (Addendum)
[name] ([id number]) Visit Report for 02/27/2021 Chief Complaint Document Details Patient Name: Date of Service: [name] 02/27/2021 1:15 PM Medical Record Number: [id number] Patient Account Number: [id number] Date of Birth/Sex: Treating RN: Mar 06, 1966 (55 y.o. [name] Primary Care Provider: PA [name], [place] Other Clinician: Referring Provider: Treating Provider/Extender: [name] in Treatment: 0 Information Obtained from: Patient Chief Complaint Left LE Ulcer Electronic Signature(s) Signed: 02/27/2021 2:09:48 PM By: Joseph Kelp Haas Entered By: [name] on 02/27/2021 14:09:47 -------------------------------------------------------------------------------- HPI Details Patient Name: Date of Service: [name] D. 02/27/2021 1:15 PM Medical Record Number: [id number] Patient Account Number: [id number] Date of Birth/Sex: Treating RN: 03/13/1966 (55 y.o. [name] Primary Care Provider: PA [name], [place] Other Clinician: Referring Provider: Treating Provider/Extender: [name] in Treatment: 0 History of Present Illness HPI Description: 02/27/2021 upon evaluation today patient appears to be having some issues here with venous stasis ulceration with stasis dermatitis to the left leg. This has been going on for 2 years intermittently coming and going in severity. Fortunately there does not appear to be any signs of active infection at this time which is great news. No fevers, chills, nausea, vomiting, or diarrhea. He does have a history of varicose veins, chronic venous insufficiency, alcohol use he tells [place] that he drinks about 8-10 beers a day and subsequently he is also HIV positive. He is seen today with his girlfriend whom he has been with for 2 years. The patient states he really has not been wearing compression though has been told multiple times by multiple people that he should  be he states that he is "just stubborn". Electronic Signature(s) Signed: 02/27/2021 2:22:42 PM By: Joseph Kelp Haas Entered By: [name] on 02/27/2021 14:22:42 -------------------------------------------------------------------------------- Physical Exam Details Patient Name: Date of Service: [name], [name] 02/27/2021 1:15 PM Medical Record Number: [id number] Patient Account Number: [id number] Date of Birth/Sex: Treating RN: 28-Jun-1965 (55 y.o. [name] Primary Care Provider: PA [name], [place] Other Clinician: Referring Provider: Treating Provider/Extender: [name] in Treatment: 0 Constitutional sitting or standing blood pressure is within target range for patient.. pulse regular and within target range for patient.Marland Kitchen respirations regular, non-labored and within target range for patient.Marland Kitchen temperature within target range for patient.. Well-nourished and well-hydrated in no acute distress. Eyes conjunctiva clear no eyelid edema noted. pupils equal round and reactive to light and accommodation. Ears, Nose, Mouth, and Throat no gross abnormality of ear auricles or external auditory canals. normal hearing noted during conversation. mucus membranes moist. Respiratory normal breathing without difficulty. Cardiovascular 2+ dorsalis pedis/posterior tibialis pulses. 1+ pitting edema of the bilateral lower extremities. Musculoskeletal normal gait and posture. no significant deformity or arthritic changes, no loss or range of motion, no clubbing. Psychiatric this patient is able to make decisions and demonstrates good insight into disease process. Alert and Oriented x 3. pleasant and cooperative. Notes Upon inspection patient's wound bed actually again showed signs mainly of just stasis dermatitis and inflammation. I think that he would actually benefit from the use of a triamcinolone ointment in order to help with this currently. He is in  agreement with that plan. Subsequently we will do that coupled with a 3 layer compression wrap to help with edema control so we get some of the swelling down and hopefully get things moving in a much better direction. Also think long- term he is getting the compression  socks we will give him information to go and get those ordered as well. Electronic Signature(s) Signed: 02/27/2021 2:23:22 PM By: Joseph KelpStone III, Riyah Bardon Haas Entered By: Joseph KelpStone III, [name] on 02/27/2021 14:23:21 -------------------------------------------------------------------------------- Physician Orders Details Patient Name: Date of Service: [name], Joseph Joseph D. 02/27/2021 1:15 PM Medical Record Number: [id number] Patient Account Number: [id number] Date of Birth/Sex: Treating RN: 07/19/1965 (55 y.o. [name]) [name] Primary Care Provider: PA TIENT, NO Other Clinician: Referring Provider: Treating Provider/Extender: Joseph CarwinStone III, Lolly Glaus Dixon, Stephanie Weeks in Treatment: 0 Verbal / Phone Orders: No Diagnosis Coding ICD-10 Coding Code Description I87.2 Venous insufficiency (chronic) (peripheral) L97.822 Non-pressure chronic ulcer of other part of left lower leg with fat layer exposed F10.99 Alcohol use, unspecified with unspecified alcohol-induced disorder B20 Human immunodeficiency virus [HIV] disease Follow-up Appointments ppointment in 1 week. Leonard Schwartz- [place] Return A Patient to call and purchase compression stockings. Bathing/ Shower/ Hygiene May shower with protection but do not get wound dressing(s) wet. Edema Control - Lymphedema / SCD / Other Elevate legs to the level of the heart or above for 30 minutes daily and/or when sitting, a frequency of: - 3-4 times a day throughout the day. Avoid standing for long periods of time. Exercise regularly Compression stocking or Garment 20-30 mm/Hg pressure to: - order stockings bring in weekly until the area heals. Wound Treatment Wound #1 - Lower Leg Wound Laterality: Left,  Anterior Cleanser: Soap and Water 1 x Per Week Discharge Instructions: May shower and wash wound with dial antibacterial soap and water prior to dressing change. Topical: Triamcinolone 1 x Per Week Discharge Instructions: Apply Triamcinolone ***OINTMENT**** LIBERALLY as directed Secondary Dressing: Zetuvit Plus 4x8 in 1 x Per Week Discharge Instructions: Apply over primary dressing as directed. Compression Wrap: ThreePress (3 layer compression wrap) 1 x Per Week Discharge Instructions: Apply three layer compression as directed. Electronic Signature(s) Signed: 02/27/2021 3:33:06 PM By: Joseph KelpStone III, Fryda Molenda Haas Signed: 02/27/2021 5:27:53 PM By: Shawn Stalleaton, [name] RN, [name] Entered By: Shawn Stalleaton, Haas on 02/27/2021 14:19:20 -------------------------------------------------------------------------------- Problem List Details Patient Name: Date of Service: [name], Joseph Joseph D. 02/27/2021 1:15 PM Medical Record Number: [id number] Patient Account Number: [id number] Date of Birth/Sex: Treating RN: 04/16/1965 (55 y.o. Joseph SchoonerM) Boehlein, Linda Primary Care Provider: PA Joseph JordanIENT, [place] Other Clinician: Referring Provider: Treating Provider/Extender: Joseph CarwinStone III, Rayni Nemitz Dixon, Stephanie Weeks in Treatment: 0 Active Problems ICD-10 Encounter Code Description Active Date MDM Diagnosis I87.2 Venous insufficiency (chronic) (peripheral) 02/27/2021 No Yes L97.822 Non-pressure chronic ulcer of other part of left lower leg with fat layer exposed 02/27/2021 No Yes F10.99 Alcohol use, unspecified with unspecified alcohol-induced disorder 02/27/2021 No Yes B20 Human immunodeficiency virus [HIV] disease 02/27/2021 No Yes Inactive Problems Resolved Problems Electronic Signature(s) Signed: 02/27/2021 2:09:25 PM By: Joseph KelpStone III, Sultan Pargas Haas Entered By: Joseph KelpStone III, Ulisses Vondrak on 02/27/2021 14:09:25 -------------------------------------------------------------------------------- Progress Note Details Patient Name: Date of Service: [name],  Joseph Joseph D. 02/27/2021 1:15 PM Medical Record Number: [id number] Patient Account Number: [id number] Date of Birth/Sex: Treating RN: 06/13/1965 (55 y.o. Joseph SchoonerM) Boehlein, Linda Primary Care Provider: PA Joseph JordanIENT, [place] Other Clinician: Referring Provider: Treating Provider/Extender: Joseph CarwinStone III, Levent Kornegay Dixon, Stephanie Weeks in Treatment: 0 Subjective Chief Complaint Information obtained from Patient Left LE Ulcer History of Present Illness (HPI) 02/27/2021 upon evaluation today patient appears to be having some issues here with venous stasis ulceration with stasis dermatitis to the left leg. This has been going on for 2 years intermittently coming and going in severity. Fortunately there does not appear to be any signs of active infection at  this time which is great news. No fevers, chills, nausea, vomiting, or diarrhea. He does have a history of varicose veins, chronic venous insufficiency, alcohol use he tells [place] that he drinks about 8-10 beers a day and subsequently he is also HIV positive. He is seen today with his girlfriend whom he has been with for 2 years. The patient states he really has not been wearing compression though has been told multiple times by multiple people that he should be he states that he is "just stubborn". Patient History Information obtained from Patient, Chart. Allergies Sulfa (Sulfonamide Antibiotics) (Severity: Moderate, Reaction: rash), hydrocodone (Severity: Mild, Reaction: sleepy), tramadol (Reaction: nausea) Family History Cancer - Mother, Heart Disease - Siblings, Hypertension - Siblings, No family history of Diabetes, Kidney Disease, Lung Disease, Seizures, Stroke, Thyroid Problems, Tuberculosis. Social History Never smoker, Marital Status - Single, Alcohol Use - Daily - x2-8 beers daily, Drug Use - Current History - marijuana, Caffeine Use - Daily - coffee. Medical History Eyes Denies history of Cataracts, Glaucoma, Optic Neuritis Ear/Nose/Mouth/Throat Denies  history of Chronic sinus problems/congestion, Middle ear problems Hematologic/Lymphatic Denies history of Anemia, Hemophilia, Human Immunodeficiency Virus, Lymphedema, Sickle Cell Disease Respiratory Patient has history of Asthma Denies history of Aspiration, Chronic Obstructive Pulmonary Disease (COPD), Pneumothorax, Sleep Apnea, Tuberculosis Cardiovascular Patient has history of Hypertension, Hypotension Denies history of Angina, Arrhythmia, Congestive Heart Failure, Coronary Artery Disease, Deep Vein Thrombosis, Myocardial Infarction, Peripheral Arterial Disease, Peripheral Venous Disease, Phlebitis, Vasculitis Gastrointestinal Denies history of Cirrhosis , Colitis, Crohnoos, Hepatitis A, Hepatitis B, Hepatitis C Endocrine Denies history of Type I Diabetes, Type II Diabetes Genitourinary Denies history of End Stage Renal Disease Immunological Denies history of Lupus Erythematosus, Raynaudoos, Scleroderma Integumentary (Skin) Denies history of History of Burn Musculoskeletal Patient has history of Osteoarthritis Denies history of Gout, Rheumatoid Arthritis, Osteomyelitis Neurologic Denies history of Dementia, Neuropathy, Quadriplegia, Paraplegia, Seizure Disorder Oncologic Denies history of Received Chemotherapy, Received Radiation Psychiatric Denies history of Anorexia/bulimia, Confinement Anxiety Hospitalization/Surgery History - at least 7 times for PNA. - hip arthroplasty Left 2016 infection IandD replacement. - right hip replacement 01/2018. Medical A Surgical History Notes nd Respiratory PE 2015 Cardiovascular hypokalemia Immunological HIV Psychiatric history depression Review of Systems (ROS) Constitutional Symptoms (General Health) Denies complaints or symptoms of Fatigue, Fever, Chills, Marked Weight Change. Eyes Complains or has symptoms of Glasses / Contacts - reading. Denies complaints or symptoms of Dry Eyes, Vision Changes. Ear/Nose/Mouth/Throat Denies  complaints or symptoms of Chronic sinus problems or rhinitis. Respiratory Denies complaints or symptoms of Chronic or frequent coughs, Shortness of Breath. Cardiovascular Denies complaints or symptoms of Chest pain. Gastrointestinal Denies complaints or symptoms of Frequent diarrhea, Nausea, Vomiting. Endocrine Denies complaints or symptoms of Heat/cold intolerance. Genitourinary Denies complaints or symptoms of Frequent urination. Integumentary (Skin) Complains or has symptoms of Wounds - left leg. Musculoskeletal Denies complaints or symptoms of Muscle Pain, Muscle Weakness. Neurologic Denies complaints or symptoms of Numbness/parasthesias. Psychiatric Denies complaints or symptoms of Claustrophobia, Suicidal. Objective Constitutional sitting or standing blood pressure is within target range for patient.. pulse regular and within target range for patient.Marland Kitchen respirations regular, non-labored and within target range for patient.Marland Kitchen temperature within target range for patient.. Well-nourished and well-hydrated in no acute distress. Vitals Time Taken: 1:20 PM, Height: 74 in, Source: Stated, Weight: 226 lbs, Source: Stated, BMI: 29, Temperature: 98.3 F, Pulse: 84 bpm, Respiratory Rate: 20 breaths/min, Blood Pressure: 133/80 mmHg. Eyes conjunctiva clear no eyelid edema noted. pupils equal round and reactive to light and accommodation. Ears,  Nose, Mouth, and Throat no gross abnormality of ear auricles or external auditory canals. normal hearing noted during conversation. mucus membranes moist. Respiratory normal breathing without difficulty. Cardiovascular 2+ dorsalis pedis/posterior tibialis pulses. 1+ pitting edema of the bilateral lower extremities. Musculoskeletal normal gait and posture. no significant deformity or arthritic changes, no loss or range of motion, no clubbing. Psychiatric this patient is able to make decisions and demonstrates good insight into disease process. Alert  and Oriented x 3. pleasant and cooperative. General Notes: Upon inspection patient's wound bed actually again showed signs mainly of just stasis dermatitis and inflammation. I think that he would actually benefit from the use of a triamcinolone ointment in order to help with this currently. He is in agreement with that plan. Subsequently we will do that coupled with a 3 layer compression wrap to help with edema control so we get some of the swelling down and hopefully get things moving in a much better direction. Also think long-term he is getting the compression socks we will give him information to go and get those ordered as well. Integumentary (Hair, Skin) Wound #1 status is Open. Original cause of wound was Gradually Appeared. The date acquired was: 03/17/2020. The wound is located on the Left,Anterior Lower Leg. The wound measures 1cm length x 0.4cm width x 0.1cm depth; 0.314cm^2 area and 0.031cm^3 volume. There is Fat Layer (Subcutaneous Tissue) exposed. There is no tunneling or undermining noted. There is a medium amount of serosanguineous drainage noted. The wound margin is distinct with the outline attached to the wound base. There is large (67-100%) red granulation within the wound bed. There is a small (1-33%) amount of necrotic tissue within the wound bed including Adherent Slough. General Notes: rash like area to entire anterior portion of leg. Per patient weeps from anterior portion of leg as well. 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 There was a Three Layer Compression Therapy Procedure by [name], RN. Post procedure Diagnosis  Wound #1: Same as Pre-Procedure Plan Follow-up Appointments: Return Appointment in 1 week. Leonard Schwartz Patient to call and purchase compression stockings. Bathing/ Shower/ Hygiene: May shower with protection but do not get wound dressing(s) wet. Edema Control - Lymphedema / SCD / Other: Elevate legs to the level of the heart or above for 30 minutes daily and/or when sitting, a frequency of: - 3-4 times a day throughout the day. Avoid standing for long periods of time. Exercise regularly Compression stocking or Garment 20-30 mm/Hg pressure to: - order stockings bring in weekly until the area heals. WOUND #1: - Lower Leg Wound Laterality: Left, Anterior Cleanser: Soap and Water 1 x Per Week/ Discharge Instructions: May shower and wash wound with dial antibacterial soap and water prior to dressing change. Topical: Triamcinolone 1 x Per Week/ Discharge Instructions: Apply Triamcinolone ***OINTMENT**** LIBERALLY as directed Secondary Dressing: Zetuvit Plus 4x8 in 1 x Per Week/ Discharge Instructions: Apply over primary dressing as directed. Com pression Wrap: ThreePress (3 layer compression wrap) 1 x Per Week/ Discharge Instructions: Apply three layer compression as directed. 1. I would recommend currently that we go ahead and continue with the wound care measures as mentioned above initiating triamcinolone to the stasis dermatitis region of his leg and  then subsequently on the recommend as well that we go ahead and have the patient initiate treatment with a 3 layer compression wrap to the left leg. 2. Also can have them order compression socks from [facility]. 3. I am also can recommend that we use Zetuvit over top of the ointment in order to help catch any excessive drainage as especially with the wrap on he may drink quite a bit. We will see patient back for reevaluation in 1 week here in the clinic. If anything worsens or changes patient will contact our office for  additional recommendations. Electronic Signature(s) Signed: 02/27/2021 2:24:06 PM By: Worthy Keeler Haas Entered By: Worthy Keeler on 02/27/2021 14:24:06 -------------------------------------------------------------------------------- HxROS Details Patient Name: Date of Service: Joseph Memos D. 02/27/2021 1:15 PM Medical Record Number: [id number] Patient Account Number: [id number] Date of Birth/Sex: Treating RN: 09-23-1965 (55 y.o. Hessie Diener Primary Care Provider: PA Haig Prophet, [place] Other Clinician: Referring Provider: Treating Provider/Extender: Gwyndolyn Kaufman in Treatment: 0 Information Obtained From Patient Chart Constitutional Symptoms (General Health) Complaints and Symptoms: Negative for: Fatigue; Fever; Chills; Marked Weight Change Eyes Complaints and Symptoms: Positive for: Glasses / Contacts - reading Negative for: Dry Eyes; Vision Changes Medical History: Negative for: Cataracts; Glaucoma; Optic Neuritis Ear/Nose/Mouth/Throat Complaints and Symptoms: Negative for: Chronic sinus problems or rhinitis Medical History: Negative for: Chronic sinus problems/congestion; Middle ear problems Respiratory Complaints and Symptoms: Negative for: Chronic or frequent coughs; Shortness of Breath Medical History: Positive for: Asthma Negative for: Aspiration; Chronic Obstructive Pulmonary Disease (COPD); Pneumothorax; Sleep Apnea; Tuberculosis Past Medical History Notes: PE 2015 Cardiovascular Complaints and Symptoms: Negative for: Chest pain Medical History: Positive for: Hypertension; Hypotension Negative for: Angina; Arrhythmia; Congestive Heart Failure; Coronary Artery Disease; Deep Vein Thrombosis; Myocardial Infarction; Peripheral Arterial Disease; Peripheral Venous Disease; Phlebitis; Vasculitis Past Medical History Notes: hypokalemia Gastrointestinal Complaints and Symptoms: Negative for: Frequent diarrhea; Nausea; Vomiting Medical  History: Negative for: Cirrhosis ; Colitis; Crohns; Hepatitis A; Hepatitis B; Hepatitis C Endocrine Complaints and Symptoms: Negative for: Heat/cold intolerance Medical History: Negative for: Type I Diabetes; Type II Diabetes Genitourinary Complaints and Symptoms: Negative for: Frequent urination Medical History: Negative for: End Stage Renal Disease Integumentary (Skin) Complaints and Symptoms: Positive for: Wounds - left leg Medical History: Negative for: History of Burn Musculoskeletal Complaints and Symptoms: Negative for: Muscle Pain; Muscle Weakness Medical History: Positive for: Osteoarthritis Negative for: Gout; Rheumatoid Arthritis; Osteomyelitis Neurologic Complaints and Symptoms: Negative for: Numbness/parasthesias Medical History: Negative for: Dementia; Neuropathy; Quadriplegia; Paraplegia; Seizure Disorder Psychiatric Complaints and Symptoms: Negative for: Claustrophobia; Suicidal Medical History: Negative for: Anorexia/bulimia; Confinement Anxiety Past Medical History Notes: history depression Hematologic/Lymphatic Medical History: Negative for: Anemia; Hemophilia; Human Immunodeficiency Virus; Lymphedema; Sickle Cell Disease Immunological Medical History: Negative for: Lupus Erythematosus; Raynauds; Scleroderma Past Medical History Notes: HIV Oncologic Medical History: Negative for: Received Chemotherapy; Received Radiation Immunizations Pneumococcal Vaccine: Received Pneumococcal Vaccination: Yes Received Pneumococcal Vaccination On or After 60th Birthday: Yes Implantable Devices No devices added Hospitalization / Surgery History Type of Hospitalization/Surgery at least 7 times for PNA hip arthroplasty Left 2016 infection IandD replacement right hip replacement 01/2018 Family and Social History Cancer: Yes - Mother; Diabetes: No; Heart Disease: Yes - Siblings; Hypertension: Yes - Siblings; Kidney Disease: No; Lung Disease: No; Seizures:  No; Stroke: No; Thyroid Problems: No; Tuberculosis: No; Never smoker; Marital Status - Single; Alcohol Use: Daily - x2-8 beers daily; Drug Use: Current History - marijuana; Caffeine Use: Daily - coffee; Financial Concerns: No; Food,  Clothing or Shelter Needs: No; Support System Lacking: No; Transportation Concerns: No Electronic Signature(s) Signed: 02/27/2021 3:33:06 PM By: Worthy Keeler Haas Signed: 02/27/2021 5:27:53 PM By: Deon Pilling RN, [name] Entered By: Deon Pilling on 02/27/2021 13:36:41 -------------------------------------------------------------------------------- SuperBill Details Patient Name: Date of Service: Joseph Memos D. 02/27/2021 Medical Record Number: [id number] Patient Account Number: [id number] Date of Birth/Sex: Treating RN: 1965-03-31 (55 y.o. 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, unspecified with unspecified alcohol-induced disorder B20 Human immunodeficiency virus [HIV] disease Facility Procedures CPT4 Code: [id number] Description: [id number] - WOUND CARE VISIT-LEV 3 EST PT Modifier: Quantity: 1 CPT4 Code: [id number] Description: (Facility Use Only) [id number] - APPLY MULTLAY COMPRS LWR LT LEG Modifier: Quantity: 1 Physician Procedures : CPT4 Code Description Modifier [id number] - WC PHYS LEVEL 4 - NEW PT ICD-10 Diagnosis Description I87.2 Venous insufficiency (chronic) (peripheral) L97.822 Non-pressure chronic ulcer of other part of left lower leg with fat layer exposed F10.99  Alcohol use, unspecified with unspecified alcohol-induced disorder B20 Human immunodeficiency virus [HIV] disease Quantity: 1 Electronic  Signature(s) Signed: 03/05/2021 7:28:57 AM By: Deon Pilling RN, [name] Signed: 03/27/2021 5:05:54 PM By: Worthy Keeler Haas Previous Signature: 02/27/2021 2:24:21 PM Version By: Worthy Keeler Haas Entered By: Deon Pilling on 03/05/2021 07:28:56

## 2021-02-27 NOTE — Progress Notes (Signed)
Psychiatric Initial Adult Assessment   Patient Identification: DREYTON ROESSNER MRN:  [id number] Date of Evaluation:  02/27/2021 Referral Source: Chief Complaint:   Visit Diagnosis:    ICD-10-CM   1. Mood swings  R45.86 FLUoxetine (PROZAC) 20 MG capsule    QUEtiapine (SEROQUEL) 50 MG tablet      History of Present Illness:    This patient is a 55 year old white male who is seen with his girlfriend Victorino Dike.  Patient has significant substance abuse issues.  He drinks a 6 pack a day and was using cocaine up to about a month ago.  The patient is on disability.  He is HIV positive.  He has had hip surgery.  He has been married twice and has 5 children.  Today he is quite sarcastic and says that he needs to be in care so we can get his mood better.  He hardly appears depressed.  He is sleeping well as long as he takes his Seroquel.  His appetite is normal and his energy level is normal.  His alcohol extensive.  He has a history of a DUI.  He denies symptoms of psychosis.  It is not clear if he has ever had major depression nor he has had manic symptoms.  His girlfriend says that he is very unstable and that his mood shifts around all the time.  He has a significant past psychiatric history and has been in a psychiatric hospital [facility].  The patient is seen from his infectious disease doctor has been prescribing his psychotropic medicines.  He receives Prozac 20 mg and Seroquel 50 mg.  He recently came off of Effexor.  His mood is clearly influx.  Today we talked about the importance of substance abuse treatment and he acknowledges it is important.  He is accepting treatment to go toward chemical IOP program.  Associated Signs/Symptoms: Depression Symptoms:  psychomotor agitation, (Hypo) Manic Symptoms:   Anxiety Symptoms:   Psychotic Symptoms:     PTSD Symptoms: NA  Past Psychiatric History: Past state hospital stay and being treated with Prozac and  Seroquel  Previous Psychotropic Medications: Yes   Substance Abuse History in the last 12 months:  Yes.    Consequences of Substance Abuse:    Past Medical History:  Past Medical History:  Diagnosis Date   Anxiety    Arthritis    Asthma    Depression    History of Clostridium difficile colitis 04/22/2017   History of pulmonary embolus (PE) 03/17/2013   Overview:  post hip surgery, on blood thinners at the time   HIV (human immunodeficiency virus infection) (HCC)    Hypokalemia    Hypotension 01/25/2015   Immune deficiency disorder (HCC)    Infectious folliculitis 01/30/2017   MRSA (methicillin resistant staph aureus) culture positive    Sepsis (HCC)    Streptococcal pneumonia [facility])     Past Surgical History:  Procedure Laterality Date   hip arthroplasty Left 2016   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, I&D with replacement in 2017   TOTAL HIP ARTHROPLASTY Right 02/09/2018   Procedure: TOTAL HIP ARTHROPLASTY ANTERIOR APPROACH;  Surgeon: Durene Romans, MD;  Location: [facility];  Service: Orthopedics;  Laterality: Right;  70 mins    Family Psychiatric History:   Family History:  Family History  Problem Relation Age of Onset   Cancer Mother    CAD Brother     Social History:   Social History   Socioeconomic History  Marital status: Divorced    Spouse name: Not on file   Number of children: 5   Years of education: 77   Highest education level: Not on file  Occupational History    Comment: na  Tobacco Use   Smoking status: Former    Types: Cigarettes   Smokeless tobacco: Current    Types: Chew  Vaping Use   Vaping Use: Never used  Substance and Sexual Activity   Alcohol use: Yes    Comment: occasionally   Drug use: Yes    Types: Marijuana   Sexual activity: Not on file    Comment: declined condoms  Other Topics Concern   Not on file  Social History Narrative   Lives alone   Social Determinants of Health   Financial Resource Strain: Not on file  Food  Insecurity: Not on file  Transportation Needs: Not on file  Physical Activity: Not on file  Stress: Not on file  Social Connections: Not on file    Additional Social History:   Allergies:   Allergies  Allergen Reactions   Hydrocodone Other (See Comments)    "makes me overly sleepy"   Sulfa Antibiotics Rash   Tramadol Nausea Only    Metabolic Disorder Labs: Lab Results  Component Value Date   HGBA1C 4.9 08/20/2020   MPG 94 08/20/2020   MPG 99.67 06/22/2017   No results found for: PROLACTIN Lab Results  Component Value Date   CHOL 221 (H) 04/11/2019   TRIG 365 (H) 04/11/2019   HDL 44 04/11/2019   CHOLHDL 5.0 (H) 04/11/2019   VLDL 10 06/22/2017   LDLCALC 125 (H) 04/11/2019   LDLCALC 71 06/22/2017   No results found for: TSH  Therapeutic Level Labs: No results found for: LITHIUM No results found for: CBMZ No results found for: VALPROATE  Current Medications: Current Outpatient Medications  Medication Sig Dispense Refill   albuterol (VENTOLIN HFA) 108 (90 Base) MCG/ACT inhaler Inhale 2 puffs into the lungs every 6 (six) hours as needed for wheezing or shortness of breath. 54 g 5   amoxicillin-clavulanate (AUGMENTIN) 875-125 MG tablet Take 1 tablet by mouth 2 (two) times daily for 10 days. 20 tablet 0   Darunavir-Cobicisctat-Emtricitabine-Tenofovir Alafenamide (SYMTUZA) 800-150-200-10 MG TABS Take 1 tablet by mouth daily with breakfast. 30 tablet 5   dolutegravir (TIVICAY) 50 MG tablet Take 1 tablet (50 mg total) by mouth daily. 30 tablet 5   doxycycline (VIBRA-TABS) 100 MG tablet Take 1 tablet (100 mg total) by mouth 2 (two) times daily. 20 tablet 0   FLUoxetine (PROZAC) 20 MG capsule 2  qam 60 capsule 2   gabapentin (NEURONTIN) 600 MG tablet Take 2 tablets (1,200 mg total) by mouth 3 (three) times daily. 180 tablet 3   omeprazole (PRILOSEC) 20 MG capsule TAKE 1 CAPSULE(20 MG) BY MOUTH DAILY 90 capsule 0   predniSONE (DELTASONE) 10 MG tablet Take 2 tablets (20 mg  total) by mouth daily with breakfast for 5 days. 10 tablet 0   QUEtiapine (SEROQUEL) 50 MG tablet 2  qhs 60 tablet 2   sildenafil (VIAGRA) 50 MG tablet TAKE 1/2 (ONE-HALF) TABLET BY MOUTH ONCE DAILY AS NEEDED 10 tablet 0   triamcinolone ointment (KENALOG) 0.5 % Apply 1 application topically 2 (two) times daily. 30 g 3   valACYclovir (VALTREX) 500 MG tablet Take 1 tablet (500 mg total) by mouth daily. 30 tablet 11   venlafaxine XR (EFFEXOR-XR) 37.5 MG 24 hr capsule Take 37.5 mg by mouth daily.  No current facility-administered medications for this visit.    Musculoskeletal: Strength & Muscle Tone: within normal limits Gait & Station: normal Patient leans: N/A  Psychiatric Specialty Exam: Review of Systems  There were no vitals taken for this visit.There is no height or weight on file to calculate BMI.  General Appearance: Casual  Eye Contact:  Good  Speech:  Negative and NA  Volume:  Increased  Mood:  Euthymic  Affect:  Labile  Thought Process:  Coherent  Orientation:  NA  Thought Content:  Logical  Suicidal Thoughts:  No  Homicidal Thoughts:  No  Memory:  NA  Judgement:  Fair  Insight:  Fair  Psychomotor Activity:  Restlessness  Concentration:    Recall:  [place] of Knowledge:Good  Language: Good  Akathisia:  No  Handed:  Right  AIMS (if indicated):  not done  Assets:  Desire for Improvement  ADL's:  Intact  Cognition: WNL  Sleep:  Good   Screenings: PHQ2-9    [facility] Office Visit from 02/26/2021 in [facility] for Infectious Disease Office Visit from 04/05/2018 in [facility] for [facility]ious Disease Office Visit from 02/08/2018 in [facility] for Infectious Disease Office Visit from 05/04/2017 in [facility] for Infectious Disease  PHQ-2 Total Score 1 0 0 0       Assessment and Plan:  At this time it is evident to me that this patient's mood is erratic.  It is hard to make a diagnosis of an  affective disorder.  What is evident to me is that he is using excessive amount of alcohol.  He drinks at least a 6 pack a day and his cocaine use has occurred at least once in the last month.  At this time I interventions are to increase his Prozac to 40 mg.  He claims that he was out of control and much more unstable before Prozac and that he is in fact better at 120.  We will increase it to 40.  Also increase his Seroquel 200 mg.  I would not change either of these medicines or add or change any medicines until the patient has shown some signs of recovery from his substances.  I will require him to go to an IOP program for drug treatment.  He seems to agree with this plan.  Jerral Ralph, MD 12/14/20224:17 PM

## 2021-02-28 LAB — HIV-1 RNA QUANT-NO REFLEX-BLD
HIV 1 RNA Quant: 24 Copies/mL — ABNORMAL HIGH
HIV-1 RNA Quant, Log: 1.38 Log cps/mL — ABNORMAL HIGH

## 2021-03-04 ENCOUNTER — Encounter: Payer: Self-pay | Admitting: Infectious Diseases

## 2021-03-04 NOTE — Assessment & Plan Note (Signed)
Flu shot today.  Counseled to add a daily antihistamine to see if this helps to keep him more comfortable. With current HIV regimen steroid inhalers pose a risk for adrenal insufficiency and would prefer to stay off them for now. May require pulm referral in the future if persistent symptoms.

## 2021-03-04 NOTE — Assessment & Plan Note (Signed)
Has an appt with [facility] specialist today - I gave him the office address/phone number. He is aware of the apt.

## 2021-03-04 NOTE — Assessment & Plan Note (Addendum)
He does have signs of active infection today - will treat with short course of PO steroids (1/2 regular dose w/ Symtuza drug interaction) to help with pain/swelling. Will treat with same abx regimen used previously (Doxy + Augmentin) x 10d.   He has varicosities throughout both legs and small stasis ulcers. Counseled he should be using compression stockings to the leg to help decongest and heal wounds. Topical steroids for venous stasis dermatitis (b/l leg applications).   Given > 23m history will refer to wound clinic to help with topical treatments/wraps to help him heal this. May require vascular referral in the future.

## 2021-03-04 NOTE — Assessment & Plan Note (Signed)
VL is nice and low < 40 copies indicating excellent control on Symtuza + Tivicay regimen.   Continue both once daily together with food. FU with [name] in 87m to follow up on other medical problems.

## 2021-03-04 NOTE — Assessment & Plan Note (Signed)
Flu shot given today. Declined covid vaccines.

## 2021-03-05 ENCOUNTER — Ambulatory Visit (INDEPENDENT_AMBULATORY_CARE_PROVIDER_SITE_OTHER): Payer: Medicare Other | Admitting: Licensed Clinical Social Worker

## 2021-03-05 ENCOUNTER — Other Ambulatory Visit: Payer: Self-pay

## 2021-03-05 DIAGNOSIS — F1914 Other psychoactive substance abuse with psychoactive substance-induced mood disorder: Secondary | ICD-10-CM

## 2021-03-05 DIAGNOSIS — R4586 Emotional lability: Secondary | ICD-10-CM

## 2021-03-05 NOTE — Progress Notes (Signed)
Comprehensive Clinical Assessment (CCA) Screening, Triage and Referral Note  03/05/2021 CAROLS CLEMENCE [id number]  Chief Complaint:  Chief Complaint  Patient presents with   Addiction Problem   Visit Diagnosis: Unable to assess  Patient Reported Information How did you hear about [place]? Primary psychiatrist What Is the Reason for Your Visit/Call Today? Referral for cdiop How Long Has This Been Causing You Problems? Ongoing, unable to determine length What Do You Feel Would Help You the Most Today? Stabilize mood; not wanting to "knock people's block off everytime they annoy me"  Have You Recently Had Any Thoughts About Hurting Yourself? Unable to assess Are You Planning to Commit Suicide/Harm Yourself At This time? Unable to assess  Have you Recently Had Thoughts About Hurting Someone Karolee Ohs? Unable to assess; client voiced overall agitation with people who upset him but did not voice specific concerns Are You Planning to Harm Someone at This Time? No data recorded Explanation: No data recorded  Have You Used Any Alcohol or Drugs in the Past 24 Hours? Declined answering How Long Ago Did You Use Drugs or Alcohol? Unable to assess What Did You Use and How Much? Unable to assess; ongoing use of ETOH and THC voiced from client and girlfriend  Do You Currently Have a Therapist/Psychiatrist? Dr. Delford [facility], initial visit only, referred to [facility]  Have You Been Recently Discharged From Any Office Practice or Programs? Unable to assess Explanation of Discharge From Practice/Program: unable to assess   CCA Screening Triage Referral Assessment Type of Contact: in person Telemedicine Service Delivery:   Is this Initial or Reassessment? Initial for therapy Date Telepsych consult ordered in [facility]:  No data recorded Time Telepsych consult ordered in [facility]:  No data recorded Location of [place]: [place] [facility] Provider Location: [place] [facility]  Collateral Involvement: EHR,  Girlfriend  Does Patient Have a Automotive engineer Guardian? No data recorded Name and Contact of Legal Guardian: UTA If Minor and Not Living with Parent(s), Who has Custody? NA Is CPS involved or ever been involved? UTA Is APS involved or ever been involved? UTA  Patient Determined To Be At Risk for Harm To Self or Others Based on Review of Patient Reported Information or Presenting Complaint? no Method: N/A Availability of Means: UTA Intent: none voiced Notification Required: none  Are There Guns or Other Weapons in Your Home? UTA Types of Guns/Weapons: UTA Are These Weapons Safely Secured?                            UTA Who Could Verify You Are Able To Have These Secured: No data recorded Do You Have any Outstanding Charges, Pending Court Dates, Parole/Probation? No data recorded Contacted To Inform of Risk of Harm To Self or Others: No data recorded  Does Patient Present under Involuntary Commitment? no IVC Papers Initial File Date: NA  County of Residence: [place]  Patient Currently Receiving the Following Services: outpatient psych, med management, initial visit only  Determination of Need: routine  Options For Referral: Unknown due to unable to assess needs and severity of symtpoms  Discharge Disposition: Unable to assess client needs due to client level of agitation and resistance to answering basic questions about MH sx or substance use. Client became agitated with girlfriend sharing concerns about mood swings specific around substance use. Client stated he did not want to talk about anything and wanted to come back another time. Client left the room without further  discussion. Girlfriend noted client has current problem with THC and ETOH and previous trouble with crack/cocaine which related specifically to the mood swings.Girlfriend stated coming to the appointment at all was an improvement toward change. Due to client refusal to answer questions and inability to remain  in session client would not be appropriate for group at this time without further evaluation. Client was reminded of the referral from his psychiatrist for therapy but disregarded the importance.  Client left before safety concerns could be inquired about however girlfriend stated she was not concerned about his safety based on his mood at behaviors at this time. She acknowledged crisis resources available. Client may re-schedule in the future to screen appropriateness and willingness for treatment.    Harlon Ditty, LCSW

## 2021-03-06 ENCOUNTER — Encounter (HOSPITAL_BASED_OUTPATIENT_CLINIC_OR_DEPARTMENT_OTHER): Payer: Medicare Other | Admitting: Physician Assistant

## 2021-03-15 ENCOUNTER — Other Ambulatory Visit: Payer: Self-pay

## 2021-03-15 ENCOUNTER — Encounter (HOSPITAL_BASED_OUTPATIENT_CLINIC_OR_DEPARTMENT_OTHER): Payer: Medicare Other | Admitting: Internal Medicine

## 2021-03-15 DIAGNOSIS — I872 Venous insufficiency (chronic) (peripheral): Secondary | ICD-10-CM | POA: Diagnosis not present

## 2021-03-15 DIAGNOSIS — L97822 Non-pressure chronic ulcer of other part of left lower leg with fat layer exposed: Secondary | ICD-10-CM | POA: Diagnosis not present

## 2021-03-15 NOTE — Progress Notes (Signed)
JABIN, TAPP ([id number]) Visit Report for 03/15/2021 SuperBill Details Patient Name: Date of Service: [name], [name] 03/15/2021 Medical Record Number: [id number] Patient Account Number: [id number] Date of Birth/Sex: Treating RN: 02/27/1966 (55 y.o. 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

## 2021-03-15 NOTE — Progress Notes (Signed)
[name], [name] ([id number]) Visit Report for 03/15/2021 Arrival Information Details Patient Name: Date of Service: [name] 03/15/2021 10:00 A M Medical Record Number: [id number] Patient Account Number: [id number] Date of Birth/Sex: Treating RN: 1965/12/17 (55 y.o. [name] Primary Care Galia Rahm: PA [name], NO Other Clinician: Referring Laith Antonelli: Treating Maxine Huynh/Extender: Rae Roam in Treatment: 2 Visit Information History Since Last Visit Added or deleted any medications: No Patient Arrived: Ambulatory Any new allergies or adverse reactions: No Arrival Time: 10:09 Had a fall or experienced change in No Accompanied By: self activities of daily living that may affect Transfer Assistance: None risk of falls: Patient Identification Verified: Yes Signs or symptoms of abuse/neglect since last visito No Secondary Verification Process Completed: Yes Hospitalized since last visit: No Patient Requires Transmission-Based Precautions: No Implantable device outside of the clinic excluding No Patient Has Alerts: No cellular tissue based products placed in the center since last visit: Has Dressing in Place as Prescribed: Yes Has Compression in Place as Prescribed: No Pain Present Now: No Electronic Signature(s) Signed: 03/15/2021 1:16:39 PM By: [name], [name] Entered By: [name] on 03/15/2021 10:27:26 -------------------------------------------------------------------------------- Compression Therapy Details Patient Name: Date of Service: Joseph Drafts D. 03/15/2021 10:00 A M Medical Record Number: [id number] Patient Account Number: [id number] Date of Birth/Sex: Treating RN: October 24, 1965 (55 y.o. [name] Primary Care Alianis Trimmer: PA Fidela Juneau Other Clinician: Referring Hillery Bhalla: Treating Sagal Gayton/Extender: Rae Roam in Treatment: 2 Compression Therapy Performed for Wound Assessment:  Wound #1 Left,Anterior Lower Leg Performed By: Clinician [name], RN Compression Type: Three Emergency planning/management officer) Signed: 03/15/2021 1:16:39 PM By: [name], [name] Entered By: [name] on 03/15/2021 10:30:08 -------------------------------------------------------------------------------- Encounter Discharge Information Details Patient Name: Date of Service: Joseph Drafts D. 03/15/2021 10:00 A M Medical Record Number: [id number] Patient Account Number: [id number] Date of Birth/Sex: Treating RN: February 28, 1966 (55 y.o. [name] Primary Care Ansley Stanwood: PA [name], NO Other Clinician: Referring Deloise Marchant: Treating Deloria Brassfield/Extender: Rae Roam in Treatment: 2 Encounter Discharge Information Items Discharge Condition: Stable Ambulatory Status: Ambulatory Discharge Destination: Home Transportation: Private Auto Accompanied By: self Schedule Follow-up Appointment: Yes Clinical Summary of Care: Electronic Signature(s) Signed: 03/15/2021 1:16:39 PM By: [name], [name] Entered By: [name] on 03/15/2021 10:30:43 -------------------------------------------------------------------------------- Patient/Caregiver Education Details Patient Name: Date of Service: [name], [name] 12/30/2022andnbsp10:00 A M Medical Record Number: [id number] Patient Account Number: [id number] Date of Birth/Gender: Treating RN: 07-Feb-1966 (55 y.o. [name] Primary Care Physician: PA [name], NO Other Clinician: Referring Physician: Treating Physician/Extender: Rae Roam in Treatment: 2 Education Assessment Education Provided To: Patient Education Topics Provided Wound/Skin Impairment: Handouts: Skin Care Do's and Dont's Methods: Explain/Verbal Responses: Reinforcements needed Electronic Signature(s) Signed: 03/15/2021 1:16:39 PM By: [name], [name] Entered By: [name] on 03/15/2021  10:30:34 -------------------------------------------------------------------------------- Wound Assessment Details Patient Name: Date of Service: Joseph Drafts D. 03/15/2021 10:00 A M Medical Record Number: [id number] Patient Account Number: [id number] Date of Birth/Sex: Treating RN: 12-19-1965 (55 y.o. [name] Primary Care Relena Ivancic: PA TIENT, NO Other Clinician: Referring Antonietta Lansdowne: Treating Kimie Pidcock/Extender: Randa Spike Weeks in Treatment: 2 Wound Status Wound Number: 1 Primary Etiology: Venous Leg Ulcer Wound Location: Left, Anterior Lower Leg Wound Status: Open Wounding Event: Gradually Appeared Date Acquired: 03/17/2020 Weeks Of Treatment: 2 Clustered Wound: No Wound Measurements Length: (cm) 1 Width: (cm) 0.4 Depth: (cm) 0.1 Area: (cm) 0.314 Volume: (cm) 0.031 %  Reduction in Area: 0% % Reduction in Volume: 0% Wound Description Classification: Full Thickness Without Exposed Support Structu Exudate Amount: Medium Exudate Type: Serosanguineous Exudate Color: red, brown res Treatment Notes Wound #1 (Lower Leg) Wound Laterality: Left, Anterior Cleanser Soap and Water Discharge Instruction: May shower and wash wound with dial antibacterial soap and water prior to dressing change. Peri-Wound Care Topical Triamcinolone Discharge Instruction: Apply Triamcinolone ***OINTMENT**** LIBERALLY as directed Primary Dressing Secondary Dressing Zetuvit Plus 4x8 in Discharge Instruction: Apply over primary dressing as directed. Secured With Compression Wrap ThreePress (3 layer compression wrap) Discharge Instruction: Apply three layer compression as directed. Compression Stockings Add-Ons Electronic Signature(s) Signed: 03/15/2021 1:16:39 PM By: [name], [name] Entered By: [name] on 03/15/2021 10:27:55 -------------------------------------------------------------------------------- Vitals Details Patient Name: Date of  Service: Joseph Drafts D. 03/15/2021 10:00 A M Medical Record Number: [id number] Patient Account Number: [id number] Date of Birth/Sex: Treating RN: 07-25-1965 (55 y.o. [name] Primary Care Loree Shehata: PA TIENT, NO Other Clinician: Referring Serenitie Vinton: Treating Willard Madrigal/Extender: Rae Roam in Treatment: 2 Vital Signs Time Taken: 10:09 Temperature (F): 98 Height (in): 74 Pulse (bpm): 65 Weight (lbs): 226 Respiratory Rate (breaths/min): 20 Body Mass Index (BMI): 29 Blood Pressure (mmHg): 147/98 Reference Range: 80 - 120 mg / dl Electronic Signature(s) Signed: 03/15/2021 1:16:39 PM By: [name], [name] Entered By: [name] on 03/15/2021 10:27:41

## 2021-03-20 ENCOUNTER — Encounter (HOSPITAL_BASED_OUTPATIENT_CLINIC_OR_DEPARTMENT_OTHER): Payer: Medicare Other | Admitting: Physician Assistant

## 2021-03-27 ENCOUNTER — Encounter (HOSPITAL_BASED_OUTPATIENT_CLINIC_OR_DEPARTMENT_OTHER): Payer: Medicare Other | Admitting: Physician Assistant

## 2021-03-28 ENCOUNTER — Encounter (INDEPENDENT_AMBULATORY_CARE_PROVIDER_SITE_OTHER): Payer: Self-pay | Admitting: *Deleted

## 2021-03-28 ENCOUNTER — Other Ambulatory Visit: Payer: Self-pay

## 2021-03-28 VITALS — BP 136/86 | HR 71 | Temp 98.0°F | Wt 222.3 lb

## 2021-03-28 DIAGNOSIS — Z006 Encounter for examination for normal comparison and control in clinical research program: Secondary | ICD-10-CM

## 2021-03-28 NOTE — Research (Signed)
[name] here today for his week 24 visit for A5379, the BeeHIVe study. States that he had Covid back in November and then got the "flu" in December after receiving his flu shot. All symptoms have resolved and no other new complaints or concerns verbalized. He received his 3rd Heplisav-B injection today given in his (R) deltoid. Site unremarkable. He is scheduled to return in February for his next study visit and to see Judeth Cornfield for follow-up in the clinic.

## 2021-04-25 ENCOUNTER — Other Ambulatory Visit: Payer: Self-pay

## 2021-04-25 ENCOUNTER — Encounter (INDEPENDENT_AMBULATORY_CARE_PROVIDER_SITE_OTHER): Payer: Self-pay | Admitting: *Deleted

## 2021-04-25 VITALS — BP 124/84 | HR 79 | Temp 98.1°F | Wt 226.2 lb

## 2021-04-25 DIAGNOSIS — Z006 Encounter for examination for normal comparison and control in clinical research program: Secondary | ICD-10-CM

## 2021-04-25 LAB — COMPREHENSIVE METABOLIC PANEL
AG Ratio: 1.3 (calc) (ref 1.0–2.5)
ALT: 39 U/L (ref 9–46)
AST: 35 U/L (ref 10–35)
Albumin: 4.3 g/dL (ref 3.6–5.1)
Alkaline phosphatase (APISO): 65 U/L (ref 35–144)
BUN: 14 mg/dL (ref 7–25)
CO2: 21 mmol/L (ref 20–32)
Calcium: 8.7 mg/dL (ref 8.6–10.3)
Chloride: 105 mmol/L (ref 98–110)
Creat: 1.3 mg/dL (ref 0.70–1.30)
Globulin: 3.2 g/dL (calc) (ref 1.9–3.7)
Glucose, Bld: 86 mg/dL (ref 65–99)
Potassium: 3.9 mmol/L (ref 3.5–5.3)
Sodium: 137 mmol/L (ref 135–146)
Total Bilirubin: 0.5 mg/dL (ref 0.2–1.2)
Total Protein: 7.5 g/dL (ref 6.1–8.1)

## 2021-04-25 LAB — CK: Total CK: 288 U/L — ABNORMAL HIGH (ref 44–196)

## 2021-04-25 LAB — PHOSPHORUS: Phosphorus: 3 mg/dL (ref 2.5–4.5)

## 2021-04-25 LAB — BILIRUBIN, DIRECT: Bilirubin, Direct: 0.1 mg/dL (ref 0.0–0.2)

## 2021-04-25 NOTE — Research (Signed)
Nivan seen today for his week 28 visit for A5379, the BeeHIVe study. He denied any adverse effects or injection site reactions after his last/final Heplisav-B study injection. No new complaints or medications. He is scheduled to see Rexene Alberts, NP next week. He will return in March for his next study visit.

## 2021-04-27 DIAGNOSIS — Z20822 Contact with and (suspected) exposure to covid-19: Secondary | ICD-10-CM | POA: Diagnosis not present

## 2021-05-01 ENCOUNTER — Encounter: Payer: Self-pay | Admitting: *Deleted

## 2021-05-01 ENCOUNTER — Ambulatory Visit: Payer: Medicare Other | Admitting: Infectious Diseases

## 2021-05-01 LAB — CD4/CD8 (T-HELPER/T-SUPPRESSOR CELL)
CD4 Count: 308
CD4%: 22
CD8 % Suppressor T Cell: 45.9
CD8: 643

## 2021-05-03 DIAGNOSIS — Z20822 Contact with and (suspected) exposure to covid-19: Secondary | ICD-10-CM | POA: Diagnosis not present

## 2021-05-07 ENCOUNTER — Other Ambulatory Visit: Payer: Self-pay

## 2021-05-07 ENCOUNTER — Other Ambulatory Visit (HOSPITAL_COMMUNITY): Payer: Self-pay

## 2021-05-07 ENCOUNTER — Telehealth (HOSPITAL_COMMUNITY): Payer: Medicare Other | Admitting: Psychiatry

## 2021-05-15 ENCOUNTER — Encounter: Payer: Self-pay | Admitting: *Deleted

## 2021-05-15 LAB — HIV RNA, QUANTITATIVE, PCR: HIV 1 RNA Quant: <40

## 2021-05-24 DIAGNOSIS — Z20822 Contact with and (suspected) exposure to covid-19: Secondary | ICD-10-CM | POA: Diagnosis not present

## 2021-05-26 ENCOUNTER — Other Ambulatory Visit (HOSPITAL_COMMUNITY): Payer: Self-pay | Admitting: Psychiatry

## 2021-05-26 ENCOUNTER — Other Ambulatory Visit: Payer: Self-pay | Admitting: Family

## 2021-05-26 DIAGNOSIS — R4586 Emotional lability: Secondary | ICD-10-CM

## 2021-05-30 ENCOUNTER — Other Ambulatory Visit: Payer: Self-pay

## 2021-05-30 ENCOUNTER — Encounter (INDEPENDENT_AMBULATORY_CARE_PROVIDER_SITE_OTHER): Payer: Self-pay | Admitting: *Deleted

## 2021-05-30 ENCOUNTER — Ambulatory Visit (INDEPENDENT_AMBULATORY_CARE_PROVIDER_SITE_OTHER): Payer: Medicare Other | Admitting: Internal Medicine

## 2021-05-30 ENCOUNTER — Encounter: Payer: Self-pay | Admitting: Internal Medicine

## 2021-05-30 VITALS — BP 144/95 | HR 65 | Temp 97.7°F | Wt 230.4 lb

## 2021-05-30 VITALS — BP 143/95 | HR 66 | Temp 97.8°F | Wt 229.4 lb

## 2021-05-30 DIAGNOSIS — Z006 Encounter for examination for normal comparison and control in clinical research program: Secondary | ICD-10-CM

## 2021-05-30 DIAGNOSIS — L304 Erythema intertrigo: Secondary | ICD-10-CM | POA: Diagnosis not present

## 2021-05-30 MED ORDER — TRIAMCINOLONE ACETONIDE 0.5 % EX OINT: 1.0000 "application " | TOPICAL_OINTMENT | Freq: Two times a day (BID) | CUTANEOUS | 3 refills | Status: DC

## 2021-05-30 MED ORDER — KETOCONAZOLE 2 % EX CREA: 1.0000 "application " | TOPICAL_CREAM | Freq: Every day | CUTANEOUS | 0 refills | Status: DC

## 2021-05-30 MED ORDER — KETOCONAZOLE 2 % EX CREA
1.0000 | TOPICAL_CREAM | Freq: Every day | CUTANEOUS | 0 refills | Status: DC
Start: 2021-05-30 — End: 2021-12-03

## 2021-05-30 NOTE — Research (Signed)
[name] seen today for his week 32 visit for A5379, the BeeHIVe study. Multiple complaints today. States that he had an accident on Monday. Was driving his lawn tractor around the yard, took a sharp turn and the tractor flipped over and landed on top of him. Complaining of increase lower back pain and has a large bruise to his (L) inner thigh. He did not seek evaluation/treatment at the time of the accident. States that he has been taking 1600mg  ibuprofen twice a day since Monday, his last dose was yesterday. He does feel that his back pain is starting to improve. Notified him of concerns regarding taking this amount of ibuprofen daily. Verbalized understanding. His wife stated that he did have 1 episode of vomiting 2 nights ago that was dark brown, coffee color. No further episodes.  ?He also has a rash to his lower abdomen and lower mid-back area. Starts that it started about 2 weeks ago and that it's itchy and more intense when he gets warm/sweaty. Denied any new soaps, detergents or lotions.  ?Left lower leg extremely red and he states that it is painful. States that he has been putting "lotion" to the area and it is currently wrapped with a chux pad and an ace wrap. States the area has been weeping fluid at times. He was being followed at the wound center for treatment to the area but has not been recently due to scheduling conflicts. He is scheduled to see Dr. Thursday at the [facility] clinic after his research visit this morning. Instructed him to let her know about all these issues.  ?He will return in June for his next study visit.  ? ?

## 2021-05-30 NOTE — Progress Notes (Signed)
? ?   ? ? ? ? ?Patient Active Problem List  ? Diagnosis Date Noted  ? Erysipelas of left lower extremity 10/24/2020  ? Mood swings 10/24/2020  ? Erectile dysfunction 04/11/2019  ? Generalized anxiety disorder with panic attacks 10/25/2018  ? Anxiety about health 10/25/2018  ? Healthcare maintenance 07/06/2018  ? Numbness of right lower extremity 07/06/2018  ? Heartburn 07/06/2018  ? S/P right THA, AA 02/09/2018  ? S/P hip replacement 02/09/2018  ? Essential hypertension   ? Lung abscess (HCC) 06/21/2017  ? Lung nodule, multiple 06/14/2017  ? Asthma 04/22/2017  ? Complication of implanted device 07/24/2015  ? 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/2015  ? Severe episode of recurrent major depressive disorder, without psychotic features (HCC) 04/27/2015  ? GERD (gastroesophageal reflux disease) 01/25/2015  ? HIV (human immunodeficiency virus infection) (HCC) 01/24/2015  ? Primary osteoarthritis of both hips 03/29/2013  ? 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 ? ?Patient's Medications  ?New Prescriptions  ? No medications on file  ?Previous Medications  ? ALBUTEROL (VENTOLIN HFA) 108 (90 BASE) MCG/ACT INHALER    Inhale 2 puffs into the lungs every 6 (six) hours as needed for wheezing or shortness of breath.  ? DARUNAVIR-COBICISCTAT-EMTRICITABINE-TENOFOVIR ALAFENAMIDE (SYMTUZA) 800-150-200-10 MG TABS    Take 1 tablet by mouth daily with breakfast.  ? DOLUTEGRAVIR (TIVICAY) 50 MG TABLET    Take 1 tablet (50 mg total) by mouth daily.  ? DOXYCYCLINE (VIBRA-TABS) 100 MG TABLET    Take 1 tablet (100 mg total) by mouth 2 (two) times daily.  ? EPHEDRINE HCL 12.5 MG TABS    Take 1 tablet by mouth 2 (two) times daily as needed.  ? FLUOXETINE (PROZAC) 20 MG CAPSULE    2  qam  ? GABAPENTIN (NEURONTIN) 600 MG TABLET    Take 2 tablets (1,200 mg total) by mouth 3 (three) times daily.  ? OMEPRAZOLE (PRILOSEC) 20 MG CAPSULE    TAKE 1 CAPSULE(20 MG) BY MOUTH DAILY  ? QUETIAPINE (SEROQUEL) 50 MG TABLET    2  qhs  ?  SILDENAFIL (VIAGRA) 50 MG TABLET    TAKE 1/2 (ONE-HALF) TABLET BY MOUTH ONCE DAILY AS NEEDED  ? TRIAMCINOLONE OINTMENT (KENALOG) 0.5 %    Apply 1 application topically 2 (two) times daily.  ? VALACYCLOVIR (VALTREX) 500 MG TABLET    Take 1 tablet (500 mg total) by mouth daily.  ? VENLAFAXINE XR (EFFEXOR-XR) 37.5 MG 24 HR CAPSULE    Take 37.5 mg by mouth daily.  ?Modified Medications  ? No medications on file  ?Discontinued Medications  ? No medications on file  ? ? ?Subjective: ?[age] YM with well controlled HIV on Symtuza and Tivicay(VL<40 on 04/25/21) presents for new rash. He states he has gotten rashes on his bottom in the past. He noted that a couple days ago he had pruritic rash on his abdomen and buttocks. No changes in topical treatment/detergents. He noted he has a gained some weight and his pants have been tighter fitting. Denies fever, chills, N/V/D.  ? ?Review of Systems: ?Review of Systems  ?All other systems reviewed and are negative. ? ?Past Medical History:  ?Diagnosis Date  ? Anxiety   ? Arthritis   ? Asthma   ? Depression   ? History of Clostridium difficile colitis 04/22/2017  ? History of pulmonary embolus (PE) 03/17/2013  ? Overview:  post hip surgery, on blood thinners at the time  ? HIV (human immunodeficiency virus infection) (  HCC)   ? Hypokalemia   ? Hypotension 01/25/2015  ? Immune deficiency disorder (HCC)   ? Infectious folliculitis 01/30/2017  ? MRSA (methicillin resistant staph aureus) culture positive   ? Sepsis (HCC)   ? Streptococcal pneumonia (HCC)   ? ? ?Social History  ? ?Tobacco Use  ? Smoking status: Former  ?  Types: Cigarettes  ? Smokeless tobacco: Current  ?  Types: Chew  ?Vaping Use  ? Vaping Use: Never used  ?Substance Use Topics  ? Alcohol use: Yes  ?  Comment: occasionally  ? Drug use: Yes  ?  Types: Marijuana  ? ? ?Family History  ?Problem Relation Age of Onset  ? Cancer Mother   ? CAD Brother   ? ? ?Allergies  ?Allergen Reactions  ? Hydrocodone Other (See Comments)  ?  "makes me  overly sleepy"  ? Sulfa Antibiotics Rash  ? Tramadol Nausea Only  ? ? ?Health Maintenance  ?Topic Date Due  ? COVID-19 Vaccine (1) Never done  ? TETANUS/TDAP  Never done  ? Zoster Vaccines- Shingrix (1 of 2) Never done  ? COLONOSCOPY (Pts 45-66yrs Insurance coverage will need to be confirmed)  Never done  ? INFLUENZA VACCINE  Completed  ? Hepatitis C Screening  Completed  ? HIV Screening  Completed  ? HPV VACCINES  Aged Out  ? ? ?Objective: ? ?Vitals:  ? 05/30/21 1103  ?BP: (!) 144/95  ?Pulse: 65  ?Temp: 97.7 ?F (36.5 ?C)  ?TempSrc: Temporal  ?SpO2: 98%  ?Weight: 230 lb 6.4 oz (104.5 kg)  ? ?Body mass index is 29.58 kg/m?. ? ?Physical Exam ?Constitutional:   ?   General: He is not in acute distress. ?   Appearance: He is normal weight. He is not toxic-appearing.  ?HENT:  ?   Head: Normocephalic and atraumatic.  ?   Right Ear: External ear normal.  ?   Left Ear: External ear normal.  ?   Nose: No congestion or rhinorrhea.  ?   Mouth/Throat:  ?   Mouth: Mucous membranes are moist.  ?   Pharynx: Oropharynx is clear.  ?Eyes:  ?   Extraocular Movements: Extraocular movements intact.  ?   Conjunctiva/sclera: Conjunctivae normal.  ?   Pupils: Pupils are equal, round, and reactive to light.  ?Cardiovascular:  ?   Rate and Rhythm: Normal rate and regular rhythm.  ?   Heart sounds: No murmur heard. ?  No friction rub. No gallop.  ?Pulmonary:  ?   Effort: Pulmonary effort is normal.  ?   Breath sounds: Normal breath sounds.  ?Abdominal:  ?   General: Abdomen is flat. Bowel sounds are normal.  ?   Palpations: Abdomen is soft.  ?Musculoskeletal:     ?   General: No swelling. Normal range of motion.  ?   Cervical back: Normal range of motion and neck supple.  ?Skin: ?   General: Skin is warm.  ?Neurological:  ?   General: No focal deficit present.  ?   Mental Status: He is oriented to person, place, and time.  ?Psychiatric:     ?   Mood and Affect: Mood normal.  ? ? ?Lab Results ?Lab Results  ?Component Value Date  ? WBC 5.2  12/11/2020  ? HGB 13.2 12/11/2020  ? HCT 38.9 12/11/2020  ? MCV 93.3 12/11/2020  ? PLT 157 12/11/2020  ?  ?Lab Results  ?Component Value Date  ? CREATININE 1.30 04/25/2021  ? BUN 14 04/25/2021  ?  NA 137 04/25/2021  ? K 3.9 04/25/2021  ? CL 105 04/25/2021  ? CO2 21 04/25/2021  ?  ?Lab Results  ?Component Value Date  ? ALT 39 04/25/2021  ? AST 35 04/25/2021  ? ALKPHOS 76 02/25/2019  ? BILITOT 0.5 04/25/2021  ?  ?Lab Results  ?Component Value Date  ? CHOL 221 (H) 04/11/2019  ? HDL 44 04/11/2019  ? LDLCALC 125 (H) 04/11/2019  ? TRIG 365 (H) 04/11/2019  ? CHOLHDL 5.0 (H) 04/11/2019  ? ?Lab Results  ?Component Value Date  ? LABRPR NON-REACTIVE 04/11/2019  ? ?HIV 1 RNA Quant  ?Date Value  ?04/25/2021 <40  ?02/26/2021 24 Copies/mL (H)  ?10/24/2020 <20 Copies/mL (H)  ? ?CD4 T Cell Abs  ?Date Value  ?10/24/2020 349 /uL (L)  ?09/05/2020 359  ?08/20/2020 294 /uL (L)  ? ?  ?#Intertrigo involving lower abdomen, intergluteal fold and buttocks ?#HIV on ART(Symtuxa and tivicay) ?-Ketoconazole cream to rash ?-Wear loose fitted clothing ?-Schedule follow-up with ID(Greg Calone) for HIV management.  ? ? ?#LLE rash ?-Pt has not been going to wound care as the facility was about one hour drive away. Recommended wound care management, he plans on transferring to a closer wound care center.  ?Danelle Earthly, MD ?[facility] ?[facility] ?05/30/2021, 11:30 AM  ?

## 2021-06-01 ENCOUNTER — Other Ambulatory Visit (HOSPITAL_COMMUNITY): Payer: Self-pay | Admitting: Psychiatry

## 2021-06-01 DIAGNOSIS — R4586 Emotional lability: Secondary | ICD-10-CM

## 2021-06-01 DIAGNOSIS — Z20822 Contact with and (suspected) exposure to covid-19: Secondary | ICD-10-CM | POA: Diagnosis not present

## 2021-06-12 ENCOUNTER — Other Ambulatory Visit: Payer: Self-pay | Admitting: Family

## 2021-06-12 DIAGNOSIS — R12 Heartburn: Secondary | ICD-10-CM

## 2021-06-12 NOTE — Telephone Encounter (Signed)
Okay to refill? 

## 2021-06-13 ENCOUNTER — Other Ambulatory Visit: Payer: Self-pay | Admitting: Family

## 2021-06-13 NOTE — Telephone Encounter (Signed)
Please advise on refill.

## 2021-06-14 DIAGNOSIS — Z20822 Contact with and (suspected) exposure to covid-19: Secondary | ICD-10-CM | POA: Diagnosis not present

## 2021-06-16 DIAGNOSIS — Z20822 Contact with and (suspected) exposure to covid-19: Secondary | ICD-10-CM | POA: Diagnosis not present

## 2021-06-17 ENCOUNTER — Ambulatory Visit: Payer: Medicare Other | Admitting: Family

## 2021-06-17 ENCOUNTER — Telehealth: Payer: Self-pay

## 2021-06-17 NOTE — Telephone Encounter (Signed)
Called patient to reschedule missed appointment. No answer. Left HIPAA-compliant voicemail requesting call back.  ? ?Vrinda Heckstall E Kayal Mula, RN  ?

## 2021-07-13 DIAGNOSIS — Z20822 Contact with and (suspected) exposure to covid-19: Secondary | ICD-10-CM | POA: Diagnosis not present

## 2021-07-19 DIAGNOSIS — Z20822 Contact with and (suspected) exposure to covid-19: Secondary | ICD-10-CM | POA: Diagnosis not present

## 2021-07-21 DIAGNOSIS — Z20822 Contact with and (suspected) exposure to covid-19: Secondary | ICD-10-CM | POA: Diagnosis not present

## 2021-07-24 DIAGNOSIS — R051 Acute cough: Secondary | ICD-10-CM | POA: Diagnosis not present

## 2021-07-24 DIAGNOSIS — R059 Cough, unspecified: Secondary | ICD-10-CM | POA: Diagnosis not present

## 2021-07-24 DIAGNOSIS — Z20822 Contact with and (suspected) exposure to covid-19: Secondary | ICD-10-CM | POA: Diagnosis not present

## 2021-07-28 ENCOUNTER — Other Ambulatory Visit: Payer: Self-pay | Admitting: Family

## 2021-07-28 DIAGNOSIS — N529 Male erectile dysfunction, unspecified: Secondary | ICD-10-CM

## 2021-07-29 NOTE — Telephone Encounter (Signed)
Okay to refill.  Thanks.

## 2021-07-29 NOTE — Telephone Encounter (Signed)
Refilled

## 2021-08-22 ENCOUNTER — Other Ambulatory Visit (HOSPITAL_COMMUNITY): Payer: Self-pay | Admitting: Psychiatry

## 2021-08-22 ENCOUNTER — Other Ambulatory Visit: Payer: Self-pay | Admitting: Infectious Diseases

## 2021-08-22 ENCOUNTER — Other Ambulatory Visit: Payer: Self-pay | Admitting: Family

## 2021-08-22 DIAGNOSIS — R200 Anesthesia of skin: Secondary | ICD-10-CM

## 2021-08-22 DIAGNOSIS — R12 Heartburn: Secondary | ICD-10-CM

## 2021-08-22 DIAGNOSIS — R4586 Emotional lability: Secondary | ICD-10-CM

## 2021-08-27 ENCOUNTER — Ambulatory Visit: Payer: Medicare Other | Admitting: Family

## 2021-08-30 ENCOUNTER — Ambulatory Visit: Payer: Medicare Other | Admitting: Family

## 2021-09-12 ENCOUNTER — Other Ambulatory Visit: Payer: Medicare Other

## 2021-09-12 ENCOUNTER — Other Ambulatory Visit: Payer: Self-pay

## 2021-09-12 ENCOUNTER — Encounter (INDEPENDENT_AMBULATORY_CARE_PROVIDER_SITE_OTHER): Payer: Self-pay | Admitting: *Deleted

## 2021-09-12 ENCOUNTER — Encounter: Payer: Medicare Other | Admitting: *Deleted

## 2021-09-12 VITALS — BP 149/106 | HR 74 | Temp 97.5°F | Wt 213.2 lb

## 2021-09-12 DIAGNOSIS — B20 Human immunodeficiency virus [HIV] disease: Secondary | ICD-10-CM | POA: Diagnosis not present

## 2021-09-12 DIAGNOSIS — Z006 Encounter for examination for normal comparison and control in clinical research program: Secondary | ICD-10-CM

## 2021-09-12 DIAGNOSIS — Z79899 Other long term (current) drug therapy: Secondary | ICD-10-CM

## 2021-09-12 DIAGNOSIS — Z113 Encounter for screening for infections with a predominantly sexual mode of transmission: Secondary | ICD-10-CM | POA: Diagnosis not present

## 2021-09-12 DIAGNOSIS — I10 Essential (primary) hypertension: Secondary | ICD-10-CM | POA: Diagnosis not present

## 2021-09-12 DIAGNOSIS — Z0000 Encounter for general adult medical examination without abnormal findings: Secondary | ICD-10-CM | POA: Diagnosis not present

## 2021-09-12 NOTE — Research (Signed)
[name] here for his week 48 visit for A5379, the BeeHIVe study. BP elevated, 149/106. Rechecked BP after an additional 5 minute rest period, 150/105. Checked his BP again in his other arm, 143/95. States that he has occasional chest discomfort. Describes it as a sharp pain that does not radiate. Denies headache, visual changes, or increased dyspnea. Seen and evaluated by Arvilla Meres, PA-C today. Scheduled him an appointment with Marcos Eke, NP in 2 weeks. Again we discussed the need to establish care with a PCP. He agreed to work on getting this done in the next week. Stable and no signs of acute distress at the time of visit completion. He will return in December for his next study visit for A5379.

## 2021-09-13 LAB — T-HELPER CELL (CD4) - (RCID CLINIC ONLY)
CD4 % Helper T Cell: 16 % — ABNORMAL LOW (ref 33–65)
CD4 T Cell Abs: 173 /uL — ABNORMAL LOW (ref 400–1790)

## 2021-09-16 LAB — COMPLETE METABOLIC PANEL WITH GFR
AG Ratio: 1.2 (calc) (ref 1.0–2.5)
ALT: 70 U/L — ABNORMAL HIGH (ref 9–46)
AST: 70 U/L — ABNORMAL HIGH (ref 10–35)
Albumin: 4.6 g/dL (ref 3.6–5.1)
Alkaline phosphatase (APISO): 71 U/L (ref 35–144)
BUN: 14 mg/dL (ref 7–25)
CO2: 25 mmol/L (ref 20–32)
Calcium: 9.6 mg/dL (ref 8.6–10.3)
Chloride: 103 mmol/L (ref 98–110)
Creat: 1.26 mg/dL (ref 0.70–1.30)
Globulin: 3.7 g/dL (calc) (ref 1.9–3.7)
Glucose, Bld: 82 mg/dL (ref 65–99)
Potassium: 4.4 mmol/L (ref 3.5–5.3)
Sodium: 137 mmol/L (ref 135–146)
Total Bilirubin: 0.6 mg/dL (ref 0.2–1.2)
Total Protein: 8.3 g/dL — ABNORMAL HIGH (ref 6.1–8.1)
eGFR: 68 mL/min/{1.73_m2} (ref >=60)

## 2021-09-16 LAB — CBC WITH DIFFERENTIAL/PLATELET
Absolute Monocytes: 509 cells/uL (ref 200–950)
Basophils Absolute: 32 cells/uL (ref 0–200)
Basophils Relative: 0.7 %
Eosinophils Absolute: 180 cells/uL (ref 15–500)
Eosinophils Relative: 4 %
HCT: 48 % (ref 38.5–50.0)
Hemoglobin: 16.7 g/dL (ref 13.2–17.1)
Lymphs Abs: 1256 cells/uL (ref 850–3900)
MCH: 30.9 pg (ref 27.0–33.0)
MCHC: 34.8 g/dL (ref 32.0–36.0)
MCV: 88.9 fL (ref 80.0–100.0)
MPV: 10.2 fL (ref 7.5–12.5)
Monocytes Relative: 11.3 %
Neutro Abs: 2525 cells/uL (ref 1500–7800)
Neutrophils Relative %: 56.1 %
Platelets: 175 10*3/uL (ref 140–400)
RBC: 5.4 10*6/uL (ref 4.20–5.80)
RDW: 15.1 % — ABNORMAL HIGH (ref 11.0–15.0)
Total Lymphocyte: 27.9 %
WBC: 4.5 10*3/uL (ref 3.8–10.8)

## 2021-09-16 LAB — LIPID PANEL
Cholesterol: 181 mg/dL (ref <200)
HDL: 68 mg/dL (ref >=40)
LDL Cholesterol (Calc): 94 mg/dL (calc)
Non-HDL Cholesterol (Calc): 113 mg/dL (calc) (ref <130)
Total CHOL/HDL Ratio: 2.7 (calc) (ref <5.0)
Triglycerides: 94 mg/dL (ref <150)

## 2021-09-16 LAB — HIV-1 RNA QUANT-NO REFLEX-BLD
HIV 1 RNA Quant: 12000 Copies/mL — ABNORMAL HIGH
HIV-1 RNA Quant, Log: 4.08 Log cps/mL — ABNORMAL HIGH

## 2021-09-16 LAB — RPR: RPR Ser Ql: NONREACTIVE

## 2021-09-25 ENCOUNTER — Other Ambulatory Visit: Payer: Self-pay | Admitting: *Deleted

## 2021-09-25 DIAGNOSIS — J45909 Unspecified asthma, uncomplicated: Secondary | ICD-10-CM

## 2021-09-25 DIAGNOSIS — B20 Human immunodeficiency virus [HIV] disease: Secondary | ICD-10-CM

## 2021-09-25 MED ORDER — SYMTUZA 800-150-200-10 MG PO TABS: 1.0000 | ORAL_TABLET | Freq: Every day | ORAL | 5 refills | Status: DC

## 2021-09-25 MED ORDER — ALBUTEROL SULFATE HFA 108 (90 BASE) MCG/ACT IN AERS: 2.0000 | INHALATION_SPRAY | Freq: Four times a day (QID) | RESPIRATORY_TRACT | 5 refills | Status: DC | PRN

## 2021-09-25 MED ORDER — TIVICAY 50 MG PO TABS: 50.0000 mg | ORAL_TABLET | Freq: Every day | ORAL | 5 refills | Status: DC

## 2021-09-25 NOTE — Progress Notes (Signed)
Spoke to [place] on the phone about his recent VL of [id number], to tell him he wouldn't be eligible to screen for the [phone] study due to that. He said he hasn't been able to get any of his meds refilled. He said he wanted to get [facility] to [facility] [place]. He has an appt. With [employer]. I told him I could probably get some of his meds refilled but he would need to talk it over with [name] tomorrow and to make sure he kept his appt.

## 2021-09-26 ENCOUNTER — Encounter: Payer: Self-pay | Admitting: Family

## 2021-09-26 ENCOUNTER — Encounter: Payer: Medicare Other | Admitting: *Deleted

## 2021-09-26 ENCOUNTER — Other Ambulatory Visit: Payer: Self-pay

## 2021-09-26 ENCOUNTER — Ambulatory Visit (INDEPENDENT_AMBULATORY_CARE_PROVIDER_SITE_OTHER): Payer: Medicare Other | Admitting: Family

## 2021-09-26 VITALS — BP 144/93 | HR 63 | Temp 97.8°F | Wt 220.0 lb

## 2021-09-26 DIAGNOSIS — R7989 Other specified abnormal findings of blood chemistry: Secondary | ICD-10-CM | POA: Diagnosis not present

## 2021-09-26 DIAGNOSIS — J4520 Mild intermittent asthma, uncomplicated: Secondary | ICD-10-CM | POA: Diagnosis not present

## 2021-09-26 DIAGNOSIS — R4586 Emotional lability: Secondary | ICD-10-CM | POA: Diagnosis not present

## 2021-09-26 DIAGNOSIS — R0602 Shortness of breath: Secondary | ICD-10-CM

## 2021-09-26 DIAGNOSIS — Z0000 Encounter for general adult medical examination without abnormal findings: Secondary | ICD-10-CM

## 2021-09-26 DIAGNOSIS — Z23 Encounter for immunization: Secondary | ICD-10-CM | POA: Diagnosis not present

## 2021-09-26 DIAGNOSIS — F1010 Alcohol abuse, uncomplicated: Secondary | ICD-10-CM

## 2021-09-26 DIAGNOSIS — B20 Human immunodeficiency virus [HIV] disease: Secondary | ICD-10-CM | POA: Diagnosis not present

## 2021-09-26 DIAGNOSIS — Z21 Asymptomatic human immunodeficiency virus [HIV] infection status: Secondary | ICD-10-CM

## 2021-09-26 MED ORDER — QUETIAPINE FUMARATE 200 MG PO TABS: 200.0000 mg | ORAL_TABLET | Freq: Every day | ORAL | 3 refills | Status: DC

## 2021-09-26 MED ORDER — FLUOXETINE HCL 40 MG PO CAPS: 40.0000 mg | ORAL_CAPSULE | Freq: Every day | ORAL | 3 refills | Status: DC

## 2021-09-26 MED ORDER — BIKTARVY 50-200-25 MG PO TABS: 1.0000 | ORAL_TABLET | Freq: Every day | ORAL | 5 refills | Status: DC

## 2021-09-26 NOTE — Assessment & Plan Note (Signed)
   Importance of safe sexual practices and condom use.  Condoms offered.  Tetanus updated.

## 2021-09-26 NOTE — Assessment & Plan Note (Signed)
[name] has known history of asthma with concern for possible COPD based on worsening symptoms.  He is using his albuterol inhaler as needed and requesting additional medication.  Will obtain chest x-ray to make sure there is no other infectious process.  Continue current dose of albuterol with consideration of adding a inhaled corticosteroid and/or combination medication such as Symbicort.

## 2021-09-26 NOTE — Assessment & Plan Note (Signed)
[name] have poorly controlled virus with most recent viral load of 12,000 and question if this may be related to his chronic nausea and vomiting and the change from taking medication at night taking medication in the morning.  Unclear source of nausea and vomiting.  Reviewed lab work and discussed plan of care.  Change Symtuza and Tivicay to [employer].  Recheck lab work today and consider starting dapsone if CD4 count remains below 200.  Plan for follow-up in 1 month or sooner if needed.

## 2021-09-26 NOTE — Assessment & Plan Note (Signed)
Continue current dose of Prozac 40 mg and Seroquel 200 mg.  Mood appears stable at current although labile.  Recommend follow-up with psychiatry for further evaluation and treatment.

## 2021-09-26 NOTE — Progress Notes (Signed)
Brief Narrative   Patient ID: BECKEM TOMBERLIN, male    DOB: Nov 13, 1965, 56 y.o.   MRN: [id number]  Mr. Rape is a 56 y/o caucasian male with HIV disease diagnosed in 1995 with risk factor of sexual contact. Initial viral load and CD4 count are not available. Genotype from 03/07/13 with no significant medication resistant mutations.No history of opportunistic infection. Previous ART experience with nelfinavir, combivir, truvada, tenofovir, raltegravir, atazanavir, ritonivir, Symtuza and Tivicay.   Subjective:    Chief Complaint  Patient presents with   Follow-up    Nausea/ not able to keep medication down 3-4 days/ acid reflux     HPI:  KYNDALL CHAPLIN is a 56 y.o. male with HIV disease last seen on 02/26/2021 with well-controlled virus and good adherence and tolerance to his ART regimen of Symtuza and Tivicay.  Viral load was undetectable with CD4 count of 308.  Most recent lab work completed on 09/12/2021 with viral load of 12,000 and CD4 count of 173.  Kidney function and electrolytes within normal limits.  AST and ALT elevated.  Here today for routine follow-up.  Mr. Malacara has been taking his medication as prescribed, however has been experiencing continued nausea and vomiting and in the last several weeks since changed his medication regimen timing to taking it in the morning as opposed to the evening.  Also has multiple concerns with shortness of breath and and mood stability as he was seen by psychiatry with medication recommendations of Seroquel 200 mg and Prozac 40 mg.  He has been without his Seroquel but continues to take his Prozac as well as his [place] and Tivicay.  Mr. Hodsdon has no problems obtaining medication from the pharmacy remains covered by Medicare.  Denies feelings of being down, depressed, or hopeless although is frustrated with obtaining medications from different providers.  Uses marijuana on occasion and is a former tobacco user.  Continues to drink  significant amounts of alcohol at "15 pack 3 times per week" which is down from a 15 pack daily. Condoms offered.  Care maintenance due includes tetanus and meningococcal vaccination.   Tetanus  Allergies  Allergen Reactions   Hydrocodone Other (See Comments)    "makes me overly sleepy"   Sulfa Antibiotics Rash   Tramadol Nausea Only      Outpatient Medications Prior to Visit  Medication Sig Dispense Refill   albuterol (VENTOLIN HFA) 108 (90 Base) MCG/ACT inhaler Inhale 2 puffs into the lungs every 6 (six) hours as needed for wheezing or shortness of breath. 54 g 5   gabapentin (NEURONTIN) 600 MG tablet TAKE 2 TABLETS (1,200 MG TOTAL) BY MOUTH 3 (THREE) TIMES DAILY. 180 tablet 3   ketoconazole (NIZORAL) 2 % cream Apply 1 application. topically daily. On rash on bottom and abdomen 15 g 0   omeprazole (PRILOSEC) 20 MG capsule TAKE 1 CAPSULE BY MOUTH EVERY DAY 90 capsule 0   sildenafil (VIAGRA) 50 MG tablet TAKE 1/2 (ONE-HALF) TABLET BY MOUTH ONCE DAILY AS NEEDED 10 tablet 0   triamcinolone ointment (KENALOG) 0.5 % Apply 1 application. topically 2 (two) times daily. 30 g 3   valACYclovir (VALTREX) 500 MG tablet Take 1 tablet (500 mg total) by mouth daily. 30 tablet 11   Darunavir-Cobicistat-Emtricitabine-Tenofovir Alafenamide (SYMTUZA) 800-150-200-10 MG TABS Take 1 tablet by mouth daily with breakfast. 30 tablet 5   dolutegravir (TIVICAY) 50 MG tablet Take 1 tablet (50 mg total) by mouth daily. 30 tablet 5   FLUoxetine (PROZAC) 20  MG capsule 2  qam 60 capsule 2   QUEtiapine (SEROQUEL) 50 MG tablet 2  qhs 60 tablet 2   venlafaxine XR (EFFEXOR-XR) 37.5 MG 24 hr capsule Take 37.5 mg by mouth daily.     venlafaxine XR (EFFEXOR-XR) 75 MG 24 hr capsule TAKE 1 CAPSULE BY MOUTH DAILY WITH BREAKFAST 30 capsule 1   doxycycline (VIBRA-TABS) 100 MG tablet Take 1 tablet (100 mg total) by mouth 2 (two) times daily. (Patient not taking: Reported on 05/30/2021) 20 tablet 0   ePHEDrine HCl 12.5 MG TABS Take  1 tablet by mouth 2 (two) times daily as needed. (Patient not taking: Reported on 05/30/2021)     No facility-administered medications prior to visit.     Past Medical History:  Diagnosis Date   Anxiety    Arthritis    Asthma    Depression    History of Clostridium difficile colitis 04/22/2017   History of pulmonary embolus (PE) 03/17/2013   Overview:  post hip surgery, on blood thinners at the time   HIV (human immunodeficiency virus infection) (HCC)    Hypokalemia    Hypotension 01/25/2015   Immune deficiency disorder (HCC)    Infectious folliculitis 01/30/2017   MRSA (methicillin resistant staph aureus) culture positive    Sepsis (HCC)    Streptococcal pneumonia [facility])      Past Surgical History:  Procedure Laterality Date   hip arthroplasty Left 2016   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, I&D with replacement in 2017   TOTAL HIP ARTHROPLASTY Right 02/09/2018   Procedure: TOTAL HIP ARTHROPLASTY ANTERIOR APPROACH;  Surgeon: Durene Romans, MD;  Location: [facility];  Service: Orthopedics;  Laterality: Right;  70 mins      Review of Systems  Constitutional:  Negative for appetite change, chills, fatigue, fever and unexpected weight change.  Eyes:  Negative for visual disturbance.  Respiratory:  Positive for cough and shortness of breath. Negative for chest tightness and wheezing.   Cardiovascular:  Negative for chest pain and leg swelling.  Gastrointestinal:  Negative for abdominal pain, constipation, diarrhea, nausea and vomiting.  Genitourinary:  Negative for dysuria, flank pain, frequency, genital sores, hematuria and urgency.  Skin:  Negative for rash.  Allergic/Immunologic: Negative for immunocompromised state.  Neurological:  Negative for dizziness and headaches.  Psychiatric/Behavioral:  Positive for agitation, behavioral problems and decreased concentration. Negative for confusion, hallucinations, self-injury and suicidal ideas. The patient is hyperactive. The patient is  not nervous/anxious.       Objective:    BP (!) 144/93   Pulse 63   Temp 97.8 F (36.6 C) (Oral)   Wt 220 lb (99.8 kg)   SpO2 97%   BMI 28.25 kg/m  Nursing note and vital signs reviewed.  Physical Exam Constitutional:      General: He is not in acute distress.    Appearance: He is well-developed.  Eyes:     Conjunctiva/sclera: Conjunctivae normal.  Cardiovascular:     Rate and Rhythm: Normal rate and regular rhythm.     Heart sounds: Normal heart sounds. No murmur heard.    No friction rub. No gallop.  Pulmonary:     Effort: Pulmonary effort is normal. No respiratory distress.     Breath sounds: Normal breath sounds. No wheezing or rales.  Chest:     Chest wall: No tenderness.  Abdominal:     General: Bowel sounds are normal.     Palpations: Abdomen is soft.     Tenderness: There is no abdominal  tenderness.  Musculoskeletal:     Cervical back: Neck supple.  Lymphadenopathy:     Cervical: No cervical adenopathy.  Skin:    General: Skin is warm and dry.     Findings: No rash.  Neurological:     Mental Status: He is alert and oriented to person, place, and time.  Psychiatric:        Behavior: Behavior normal.        Thought Content: Thought content normal.        Judgment: Judgment normal.         02/26/2021   11:07 AM 04/05/2018    4:24 PM 02/08/2018   11:37 AM 05/04/2017    9:06 AM  Depression screen PHQ 2/9  Decreased Interest 0 0 0 0  Down, Depressed, Hopeless 1 0 0 0  PHQ - 2 Score 1 0 0 0       Assessment & Plan:    Patient Active Problem List   Diagnosis Date Noted   HIV (human immunodeficiency virus infection) (HCC) 01/24/2015    Priority: High   Alcohol abuse 09/26/2021   Erysipelas of left lower extremity 10/24/2020   Mood swings 10/24/2020   Erectile dysfunction 04/11/2019   Generalized anxiety disorder with panic attacks 10/25/2018   Anxiety about health 10/25/2018   Healthcare maintenance 07/06/2018   Numbness of right lower  extremity 07/06/2018   Heartburn 07/06/2018   S/P right THA, AA 02/09/2018   S/P hip replacement 02/09/2018   Essential hypertension    Lung abscess (HCC) 06/21/2017   Lung nodule, multiple 06/14/2017   Asthma 04/22/2017   Complication of implanted device 07/24/2015   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/2015   Severe episode of recurrent major depressive disorder, without psychotic features (HCC) 04/27/2015   GERD (gastroesophageal reflux disease) 01/25/2015   Primary osteoarthritis of both hips 03/29/2013   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 Sookdeo has known history of asthma with concern for possible COPD based on worsening symptoms.  He is using his albuterol inhaler as needed and requesting additional medication.  Will obtain chest x-ray to make sure there is no other infectious process.  Continue current dose of albuterol with consideration of adding a inhaled corticosteroid and/or combination medication such as Symbicort.        Other   HIV (human immunodeficiency virus infection) (HCC) - Primary    Mr. Vos have poorly controlled virus with most recent viral load of 12,000 and question if this may be related to his chronic nausea and vomiting and the change from taking medication at night taking medication in the morning.  Unclear source of nausea and vomiting.  Reviewed lab work and discussed plan of care.  Change Symtuza and Tivicay to [employer].  Recheck lab work today and consider starting dapsone if CD4 count remains below 200.  Plan for follow-up in 1 month or sooner if needed.      Relevant Medications   bictegravir-emtricitabine-tenofovir AF (BIKTARVY) 50-200-25 MG TABS tablet   Other Relevant Orders   T-helper cell (CD4)- ([facility] clinic only)   HIV-1 RNA quant-no reflex-bld   CBC with Differential/Platelet (Completed)   Healthcare maintenance    Importance of  safe sexual practices and condom use.  Condoms offered. Tetanus updated.      Mood swings    Continue current dose of Prozac  40 mg and Seroquel 200 mg.  Mood appears stable at current although labile.  Recommend follow-up with psychiatry for further evaluation and treatment.      Alcohol abuse    Mr. Alderman continues to drink a significant amount of alcohol on a weekly basis.  Suspect this is likely the cause of his elevated liver enzymes.  As per psychiatry note it is recommended to keep his psychiatric medications stable until he is able to achieve substance use improvement/remission.      Other Visit Diagnoses     Shortness of breath       Relevant Orders   DG Chest 2 View   Elevated liver function tests       Relevant Orders   Hepatic function panel   Hepatitis C Antibody   Need for Tdap vaccination       Relevant Orders   Tdap vaccine greater than or equal to 7yo IM (Completed)        I have discontinued Theophilus Kinds. Debruhl's venlafaxine XR, doxycycline, FLUoxetine, QUEtiapine, ePHEDrine HCl, venlafaxine XR, Symtuza, and Tivicay. I am also having him start on QUEtiapine, FLUoxetine, and Biktarvy. Additionally, I am having him maintain his valACYclovir, ketoconazole, triamcinolone ointment, sildenafil, gabapentin, omeprazole, and albuterol.   Meds ordered this encounter  Medications   QUEtiapine (SEROQUEL) 200 MG tablet    Sig: Take 1 tablet (200 mg total) by mouth at bedtime.    Dispense:  30 tablet    Refill:  3    Order Specific Question:   Supervising Provider    Answer:   Drue Second, CYNTHIA [[id number]]   FLUoxetine (PROZAC) 40 MG capsule    Sig: Take 1 capsule (40 mg total) by mouth daily.    Dispense:  30 capsule    Refill:  3    Order Specific Question:   Supervising Provider    Answer:   Drue Second, CYNTHIA [[id number]]   bictegravir-emtricitabine-tenofovir AF (BIKTARVY) 50-200-25 MG TABS tablet    Sig: Take 1 tablet by mouth daily.    Dispense:  30 tablet    Refill:  5     Order Specific Question:   Supervising Provider    Answer:   Judyann Munson [[id number]]     Follow-up: Return in about 1 month (around 10/27/2021).   Marcos Eke, MSN, FNP-C Nurse Practitioner [facility] Main number: [phone]

## 2021-09-26 NOTE — Assessment & Plan Note (Signed)
[name] continues to drink a significant amount of alcohol on a weekly basis.  Suspect this is likely the cause of his elevated liver enzymes.  As per psychiatry note it is recommended to keep his psychiatric medications stable until he is able to achieve substance use improvement/remission.

## 2021-09-26 NOTE — Patient Instructions (Addendum)
Nice to see you.  We will check your lab work today.  Continue to take your medication daily as prescribed.  Refills have been sent to the pharmacy.  Plan for follow up in 1 months or sooner if needed with lab work on the same day.  Have a great day and stay safe!  

## 2021-09-27 LAB — T-HELPER CELL (CD4) - (RCID CLINIC ONLY)
CD4 % Helper T Cell: 19 % — ABNORMAL LOW (ref 33–65)
CD4 T Cell Abs: 301 /uL — ABNORMAL LOW (ref 400–1790)

## 2021-10-01 LAB — CBC WITH DIFFERENTIAL/PLATELET
Absolute Monocytes: 525 cells/uL (ref 200–950)
Basophils Absolute: 21 cells/uL (ref 0–200)
Basophils Relative: 0.5 %
Eosinophils Absolute: 221 cells/uL (ref 15–500)
Eosinophils Relative: 5.4 %
HCT: 39.6 % (ref 38.5–50.0)
Hemoglobin: 13.8 g/dL (ref 13.2–17.1)
Lymphs Abs: 1460 cells/uL (ref 850–3900)
MCH: 31.2 pg (ref 27.0–33.0)
MCHC: 34.8 g/dL (ref 32.0–36.0)
MCV: 89.4 fL (ref 80.0–100.0)
MPV: 10.4 fL (ref 7.5–12.5)
Monocytes Relative: 12.8 %
Neutro Abs: 1874 cells/uL (ref 1500–7800)
Neutrophils Relative %: 45.7 %
Platelets: 165 10*3/uL (ref 140–400)
RBC: 4.43 10*6/uL (ref 4.20–5.80)
RDW: 15.7 % — ABNORMAL HIGH (ref 11.0–15.0)
Total Lymphocyte: 35.6 %
WBC: 4.1 10*3/uL (ref 3.8–10.8)

## 2021-10-01 LAB — HEPATIC FUNCTION PANEL
AG Ratio: 1.5 (calc) (ref 1.0–2.5)
ALT: 78 U/L — ABNORMAL HIGH (ref 9–46)
AST: 57 U/L — ABNORMAL HIGH (ref 10–35)
Albumin: 4.2 g/dL (ref 3.6–5.1)
Alkaline phosphatase (APISO): 59 U/L (ref 35–144)
Bilirubin, Direct: 0.1 mg/dL (ref 0.0–0.2)
Globulin: 2.8 g/dL (calc) (ref 1.9–3.7)
Indirect Bilirubin: 0.3 mg/dL (calc) (ref 0.2–1.2)
Total Bilirubin: 0.4 mg/dL (ref 0.2–1.2)
Total Protein: 7 g/dL (ref 6.1–8.1)

## 2021-10-01 LAB — HEPATITIS C ANTIBODY: Hepatitis C Ab: NONREACTIVE

## 2021-10-01 LAB — HIV-1 RNA QUANT-NO REFLEX-BLD
HIV 1 RNA Quant: 52 Copies/mL — ABNORMAL HIGH
HIV-1 RNA Quant, Log: 1.72 Log cps/mL — ABNORMAL HIGH

## 2021-10-16 ENCOUNTER — Other Ambulatory Visit: Payer: Self-pay | Admitting: *Deleted

## 2021-10-16 NOTE — Patient Outreach (Signed)
  Care Coordination   Initial Visit Note   10/16/2021 Name: NASRI BOAKYE MRN: [id number] DOB: [birth year]  EDDI HYMES is a 56 y.o. year old male who sees Patient, No Pcp Per for primary care. I spoke with  [name] by phone today  What matters to the patients health and wellness today?  I need a primary care provider    Goals Addressed             This Visit's Progress    Establish with a primary care provider          SDOH assessments and interventions completed:  No     Care Coordination Interventions Activated:  Yes  Care Coordination Interventions:  Yes, provided   Follow up plan: Follow up call scheduled for August 3rd.    Encounter Outcome:  Pt. Visit Completed   Noralyn Pick C. Burgess [name], MSN, [facility] Gerontological Nurse Practitioner [facility] Care Management [phone]

## 2021-10-17 ENCOUNTER — Encounter: Payer: Self-pay | Admitting: *Deleted

## 2021-10-18 ENCOUNTER — Telehealth: Payer: Self-pay

## 2021-10-18 ENCOUNTER — Other Ambulatory Visit: Payer: Self-pay | Admitting: *Deleted

## 2021-10-18 DIAGNOSIS — R059 Cough, unspecified: Secondary | ICD-10-CM | POA: Diagnosis not present

## 2021-10-18 DIAGNOSIS — R0602 Shortness of breath: Secondary | ICD-10-CM | POA: Diagnosis not present

## 2021-10-18 DIAGNOSIS — B20 Human immunodeficiency virus [HIV] disease: Secondary | ICD-10-CM | POA: Diagnosis not present

## 2021-10-18 DIAGNOSIS — J209 Acute bronchitis, unspecified: Secondary | ICD-10-CM | POA: Diagnosis not present

## 2021-10-18 DIAGNOSIS — Z720 Tobacco use: Secondary | ICD-10-CM | POA: Diagnosis not present

## 2021-10-18 NOTE — Telephone Encounter (Signed)
Patient called wanting an appointment to be seen. C/O tightness in chest, SOB, Right shoulder hurting, cough with phlegm, chills x 3 days. Patient states that he took left over Amoxicillin that he had. Informed patient based off his symptoms he needs to go to the ED to be further evaluated. Patient agreed to go to ED.    Dezeray Puccio Lesli Albee, CMA

## 2021-10-18 NOTE — Patient Outreach (Signed)
  Care Coordination   INCOMING CALL FROM PT - Follow Up Visit Note   10/18/2021 Name: [name] MRN: [id number] DOB: [birth year]  [name] is a 56 y.o. year old male who sees Patient, No Pcp Per for primary care. I spoke with  [name] by phone today  What matters to the patients health and wellness today?  I think I have pneumonia again, Dr. Earl Lites my ID MD told me to get to the hospital since I am high risk for complications. I am going to the [facility] hospital on [facility].  NP to follow pt progress and call after his discharge. Did advise pt NP has scheduled him with a new primary care provider.  SDOH assessments and interventions completed:  Yes   Care Coordination Interventions Activated:  Yes  Care Coordination Interventions:  Yes, provided   Follow up plan: Follow up call scheduled for NEXT WEEK after hospital discharge TBA.    Encounter Outcome:  Pt. Visit Completed   Noralyn Pick C. Burgess [name], MSN, [facility] Gerontological Nurse Practitioner [facility] Care Management [phone]

## 2021-10-22 ENCOUNTER — Encounter: Payer: Self-pay | Admitting: *Deleted

## 2021-10-23 ENCOUNTER — Ambulatory Visit: Payer: Self-pay | Admitting: *Deleted

## 2021-10-23 NOTE — Patient Outreach (Signed)
[facility]) Care Management  10/23/2021  OLLEN RAO 02/10/1966 [id number]  Second outreach post ED visit, unsuccessful. Left message to return NP call.  Zara Council. Burgess [name], MSN, [facility] Gerontological Nurse Practitioner [facility] Care Management [phone]

## 2021-10-24 ENCOUNTER — Ambulatory Visit: Payer: Self-pay | Admitting: *Deleted

## 2021-10-24 NOTE — Patient Outreach (Signed)
  Care Coordination   10/24/2021 Name: [name] MRN: [id number] DOB: 08-26-1965   Care Coordination Outreach Attempts:  A third unsuccessful outreach was attempted today to offer the patient with information about available care coordination services as a benefit of their health plan.   Follow Up Plan:  No further outreach attempts will be made at this time. We have been unable to contact the patient to offer or enroll patient in care coordination services  Encounter Outcome:  No Answer  Care Coordination Interventions Activated:  No   Care Coordination Interventions:  No, not indicated    Cam Harnden C. Burgess [name], MSN, [facility] Gerontological Nurse Practitioner [facility] Care Management [phone]

## 2021-10-28 ENCOUNTER — Encounter: Payer: Self-pay | Admitting: Family

## 2021-10-28 ENCOUNTER — Other Ambulatory Visit (HOSPITAL_COMMUNITY): Payer: Self-pay

## 2021-10-28 ENCOUNTER — Ambulatory Visit (INDEPENDENT_AMBULATORY_CARE_PROVIDER_SITE_OTHER): Payer: Medicare Other | Admitting: Family

## 2021-10-28 ENCOUNTER — Other Ambulatory Visit: Payer: Self-pay

## 2021-10-28 VITALS — BP 134/90 | HR 66 | Temp 98.1°F | Ht 74.0 in | Wt 212.0 lb

## 2021-10-28 DIAGNOSIS — J45909 Unspecified asthma, uncomplicated: Secondary | ICD-10-CM | POA: Diagnosis not present

## 2021-10-28 DIAGNOSIS — R4586 Emotional lability: Secondary | ICD-10-CM

## 2021-10-28 DIAGNOSIS — Z21 Asymptomatic human immunodeficiency virus [HIV] infection status: Secondary | ICD-10-CM | POA: Diagnosis not present

## 2021-10-28 MED ORDER — ANORO ELLIPTA 62.5-25 MCG/ACT IN AEPB: 1.0000 | INHALATION_SPRAY | Freq: Every day | RESPIRATORY_TRACT | 2 refills | Status: DC

## 2021-10-28 MED ORDER — ALBUTEROL SULFATE HFA 108 (90 BASE) MCG/ACT IN AERS: 2.0000 | INHALATION_SPRAY | Freq: Four times a day (QID) | RESPIRATORY_TRACT | 5 refills | Status: DC | PRN

## 2021-10-28 NOTE — Assessment & Plan Note (Signed)
Stable with current dose of fluoxetine and quetiapine. Sleeping well with this regimen. No signs of psychosis or adverse side effects. Continue current dose of quetiapine and fluoxetine.

## 2021-10-28 NOTE — Assessment & Plan Note (Signed)
Question asthma versus COPD and not very well controlled with short acting beta-agonist. Would benefit from pulmonary evaluation to determine current stage of disease. In the interim will start Anoro for daily use and continue to use albuterol as needed for breakthrough shortness of breath.

## 2021-10-28 NOTE — Progress Notes (Signed)
Brief Narrative   Patient ID: [name], male    DOB: 1965/09/11, 56 y.o.   MRN: [id number]  [name] is a 56 y/o caucasian male with HIV disease diagnosed in 1995 with risk factor of sexual contact. Initial viral load and CD4 count are not available. Genotype from 03/07/13 with no significant medication resistant mutations.No history of opportunistic infection. Previous ART experience with nelfinavir, combivir, truvada, tenofovir, raltegravir, atazanavir, ritonivir, Symtuza and Tivicay.   Subjective:    Chief Complaint  Patient presents with   Follow-up   HIV Positive/AIDS    HPI:  [name] is a 56 y.o. male with HIV disease last seen on 09/26/21 with well controlled virus and good adherence and tolerance to Symtuza. Viral load was undetectable with CD4 count 301. Kidney function, liver function and electrolytes within normal ranges. Medication was changed to [facility] with likely need for corticosteroids related to his asthma. In the interim has been seen in the ED for shortness of breath and diagnosed with chronic bronchitis and was given azithromycin and prednisone. Here today for follow up.  [name] has been taking his Biktarvy as prescribed and has also been taking Tivicay as well. Currently feeling nauseous. Shortness of breath is improved since leaving the ED and has finished the azithromycin. Out of his albuterol which he was using frequently and makes his heart race.  Continues to take fluoxetine and quetiapine with no adverse side effects with mood being stable and sleeping well. Denies fevers, chills, night sweats, headaches, changes in vision, neck pain/stiffness, diarrhea, vomiting, lesions or rashes.  [name] has no problems obtaining medications from the pharmacy. Drinks alcohol daily to occasionally, uses marijuana socially and is currently a smokeless tobacco user. Condoms offered.     Allergies  Allergen Reactions   Hydrocodone Other (See  Comments)    "makes me overly sleepy"   Sulfa Antibiotics Rash   Tramadol Nausea Only      Outpatient Medications Prior to Visit  Medication Sig Dispense Refill   bictegravir-emtricitabine-tenofovir AF (BIKTARVY) 50-200-25 MG TABS tablet Take 1 tablet by mouth daily. 30 tablet 5   FLUoxetine (PROZAC) 40 MG capsule Take 1 capsule (40 mg total) by mouth daily. 30 capsule 3   gabapentin (NEURONTIN) 600 MG tablet TAKE 2 TABLETS (1,200 MG TOTAL) BY MOUTH 3 (THREE) TIMES DAILY. 180 tablet 3   omeprazole (PRILOSEC) 20 MG capsule TAKE 1 CAPSULE BY MOUTH EVERY DAY 90 capsule 0   QUEtiapine (SEROQUEL) 200 MG tablet Take 1 tablet (200 mg total) by mouth at bedtime. 30 tablet 3   sildenafil (VIAGRA) 50 MG tablet TAKE 1/2 (ONE-HALF) TABLET BY MOUTH ONCE DAILY AS NEEDED 10 tablet 0   triamcinolone ointment (KENALOG) 0.5 % Apply 1 application. topically 2 (two) times daily. 30 g 3   valACYclovir (VALTREX) 500 MG tablet Take 1 tablet (500 mg total) by mouth daily. 30 tablet 11   albuterol (VENTOLIN HFA) 108 (90 Base) MCG/ACT inhaler Inhale 2 puffs into the lungs every 6 (six) hours as needed for wheezing or shortness of breath. 54 g 5   ketoconazole (NIZORAL) 2 % cream Apply 1 application. topically daily. On rash on bottom and abdomen (Patient not taking: Reported on 10/28/2021) 15 g 0   No facility-administered medications prior to visit.     Past Medical History:  Diagnosis Date   Anxiety    Arthritis    Asthma    Depression    History of Clostridium difficile  colitis 04/22/2017   History of pulmonary embolus (PE) 03/17/2013   Overview:  post hip surgery, on blood thinners at the time   HIV (human immunodeficiency virus infection) ([place])    Hypokalemia    Hypotension 01/25/2015   Immune deficiency disorder ([place])    Infectious folliculitis 99991111   MRSA (methicillin resistant staph aureus) culture positive    Sepsis ([place])    Streptococcal pneumonia [facility])      Past Surgical History:   Procedure Laterality Date   hip arthroplasty Left 2016   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, I&D with replacement in 2017   [place] Right 02/09/2018   Procedure: TOTAL HIP ARTHROPLASTY ANTERIOR APPROACH;  Surgeon: Paralee Cancel, MD;  Location: [facility]S;  Service: Orthopedics;  Laterality: Right;  70 mins      Review of Systems  Constitutional:  Negative for appetite change, chills, fatigue, fever and unexpected weight change.  Eyes:  Negative for visual disturbance.  Respiratory:  Negative for cough, chest tightness, shortness of breath and wheezing.   Cardiovascular:  Negative for chest pain and leg swelling.  Gastrointestinal:  Positive for nausea. Negative for abdominal pain, constipation, diarrhea and vomiting.  Genitourinary:  Negative for dysuria, flank pain, frequency, genital sores, hematuria and urgency.  Skin:  Negative for rash.  Allergic/Immunologic: Negative for immunocompromised state.  Neurological:  Negative for dizziness and headaches.      Objective:    BP (!) 134/90   Pulse 66   Temp 98.1 F (36.7 C) (Oral)   Ht 6\' 2"  (1.88 m)   Wt 212 lb (96.2 kg)   SpO2 97%   BMI 27.22 kg/m  Nursing note and vital signs reviewed.  Physical Exam Constitutional:      General: He is not in acute distress.    Appearance: He is well-developed.  Eyes:     Conjunctiva/sclera: Conjunctivae normal.  Cardiovascular:     Rate and Rhythm: Normal rate and regular rhythm.     Heart sounds: Normal heart sounds. No murmur heard.    No friction rub. No gallop.  Pulmonary:     Effort: Pulmonary effort is normal. No respiratory distress.     Breath sounds: Normal breath sounds. No wheezing or rales.  Chest:     Chest wall: No tenderness.  Abdominal:     General: Bowel sounds are normal.     Palpations: Abdomen is soft.     Tenderness: There is no abdominal tenderness.  Musculoskeletal:     Cervical back: Neck supple.  Lymphadenopathy:     Cervical: No  cervical adenopathy.  Skin:    General: Skin is warm and dry.     Findings: No rash.  Neurological:     Mental Status: He is alert and oriented to person, place, and time.  Psychiatric:        Behavior: Behavior normal.        Thought Content: Thought content normal.        Judgment: Judgment normal.         10/28/2021   10:45 AM 02/26/2021   11:07 AM 04/05/2018    4:24 PM 02/08/2018   11:37 AM 05/04/2017    9:06 AM  Depression screen PHQ 2/9  Decreased Interest 0 0 0 0 0  Down, Depressed, Hopeless 0 1 0 0 0  PHQ - 2 Score 0 1 0 0 0       Assessment & Plan:    Patient Active Problem List   Diagnosis  Date Noted   HIV (human immunodeficiency virus infection) (HCC) 01/24/2015    Priority: High   Alcohol abuse 09/26/2021   Erysipelas of left lower extremity 10/24/2020   Mood swings 10/24/2020   Erectile dysfunction 04/11/2019   Generalized anxiety disorder with panic attacks 10/25/2018   Anxiety about health 10/25/2018   Healthcare maintenance 07/06/2018   Numbness of right lower extremity 07/06/2018   Heartburn 07/06/2018   S/P right THA, AA 02/09/2018   S/P hip replacement 02/09/2018   Essential hypertension    Lung abscess (HCC) 06/21/2017   Lung nodule, multiple 06/14/2017   Asthma 04/22/2017   Complication of implanted device 07/24/2015   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 Would benefit from pulmonary evaluation to determine current stage of disease. In the interim will start Anoro for daily use and continue to use albuterol as needed  for breakthrough shortness of breath.       Relevant Medications   albuterol (VENTOLIN HFA) 108 (90 Base) MCG/ACT inhaler   umeclidinium-vilanterol (ANORO ELLIPTA) 62.5-25 MCG/ACT AEPB   Other Relevant Orders   Ambulatory referral to Pulmonology     Other   HIV (human immunodeficiency virus infection) (HCC) - Primary    [name] appears to have well controlled virus with good adherence and tolerance to [employer]. Discussed importance of not taking medications that are not prescribed and he will continue to take solely Biktarvy. Check viral load today. Continue current dose of Biktarvy. Plan for follow up in 1 month or sooner if needed per his request.       Relevant Orders   HIV 1 RNA quant-no reflex-bld   Mood swings    Stable with current dose of fluoxetine and quetiapine. Sleeping well with this regimen. No signs of psychosis or adverse side effects. Continue current dose of quetiapine and fluoxetine.         I am having [name]. Yates start on Anoro Ellipta. I am also having him maintain his valACYclovir, ketoconazole, triamcinolone ointment, sildenafil, gabapentin, omeprazole, QUEtiapine, FLUoxetine, Biktarvy, and albuterol.   Meds ordered this encounter  Medications   albuterol (VENTOLIN HFA) 108 (90 Base) MCG/ACT inhaler    Sig: Inhale 2 puffs into the lungs every 6 (six) hours as needed for wheezing or shortness of breath.    Dispense:  54 g    Refill:  5    Order Specific Question:   Supervising Provider    Answer:   Judyann Munson [[id number]]   umeclidinium-vilanterol (ANORO ELLIPTA) 62.5-25 MCG/ACT AEPB    Sig: Inhale 1 puff into the lungs daily.    Dispense:  60 each    Refill:  2    Order Specific Question:   Supervising Provider    Answer:   Judyann Munson [[id number]]     Follow-up: Return in about 1 month (around 11/28/2021), or if symptoms worsen or fail to improve.   Marcos Eke, MSN, FNP-C Nurse Practitioner [facility] for Infectious Disease [facility] Main number: [phone]

## 2021-10-28 NOTE — Assessment & Plan Note (Signed)
[name] appears to have well controlled virus with good adherence and tolerance to Biktarvy. Discussed importance of not taking medications that are not prescribed and he will continue to take solely Biktarvy. Check viral load today. Continue current dose of Biktarvy. Plan for follow up in 1 month or sooner if needed per his request.

## 2021-10-28 NOTE — Patient Instructions (Addendum)
Nice to see you.  We will check your lab work today.  Continue to take your medication daily as prescribed.  Refills have been sent to the pharmacy.  Plan for follow up in 1 month or sooner if needed with lab work on the same day.   Have a great day and stay safe!  

## 2021-10-29 ENCOUNTER — Ambulatory Visit: Payer: Self-pay | Admitting: *Deleted

## 2021-10-29 NOTE — Patient Outreach (Signed)
  Care Coordination   Follow Up Visit Note   10/29/2021 Name: [name] MRN: [id number] DOB: [birth year]  [name] is a 56 y.o. year old male who sees Joseph Sickles, MD for primary care. I spoke with  [name] by phone today  What matters to the patients health and wellness today?  Getting re-established with care manager.    Goals Addressed               This Visit's Progress     Patient Stated     I agree to maintain contact with my care manager, [name] (pt-stated)        Pt was enrolled a month ago and then did not answer my calls X3. Finally was able to talk with him and advised that I cannot work with him if he doesn't answer his phone or call me back. He agrees now that he will do so. He says his phone was set so that he was not getting his calls.      Other     Establish with a primary care provider   On track     Will have his new PCP visit on 8/21.        SDOH assessments and interventions completed:  Yes     Care Coordination Interventions Activated:  Yes  Care Coordination Interventions:  Yes, provided   Follow up plan: Follow up call scheduled for 1 week.    Encounter Outcome:  Pt. Visit Completed   Joseph Pick C. Burgess [name], MSN, [facility] Gerontological Nurse Practitioner [facility] Care Management [phone]

## 2021-10-31 LAB — HIV-1 RNA QUANT-NO REFLEX-BLD
HIV 1 RNA Quant: 71 Copies/mL — ABNORMAL HIGH
HIV-1 RNA Quant, Log: 1.85 Log cps/mL — ABNORMAL HIGH

## 2021-11-02 NOTE — Progress Notes (Unsigned)
CC: Establish Care and Shoulder Pain  HPI:   [name] is a 56 y.o. male with a past medical history of hypertension, reactive airway disease, depression, anxiety, and HIV who presents to establish care and for an acute complaint of shoulder pain.      Past Medical History:  Diagnosis Date   Anxiety    Arthritis    Asthma    Depression    History of Clostridium difficile colitis 04/22/2017   History of pulmonary embolus (PE) 03/17/2013   Overview:  post hip surgery, on blood thinners at the time   HIV (human immunodeficiency virus infection) (HCC)    Hypokalemia    Hypotension 01/25/2015   Immune deficiency disorder (HCC)    Infectious folliculitis 01/30/2017   MRSA (methicillin resistant staph aureus) culture positive    Sepsis (HCC)    Streptococcal pneumonia (HCC)      Review of Systems:    Reports shoulder pain Denies numbness, tingling, weakness   Physical Exam:  Vitals:   11/04/21 1551  BP: (!) 137/94  Pulse: 66  Temp: 98.5 F (36.9 C)  TempSrc: Oral  SpO2: 98%  Weight: 224 lb 11.2 oz (101.9 kg)  Height: 6\' 2"  (1.88 m)    General:   awake and alert, sitting comfortably in chair, cooperative, not in acute distress Skin:   warm and dry, intact without any obvious lesions or scars, no rashes or lesions  Lungs:   normal respiratory effort, breathing unlabored, symmetrical chest rise, no crackles or wheezing Cardiac:   regular rate and rhythm, normal S1 and S2 Musculoskeletal:   significant tenderness to palpation over the right acromioclavicular joint; range of motion in right shoulder full except internal rotation with adduction limited by pain; strength in grip, elbow flexion and extension, and shoulder abduction strength 5 /5 bilaterally; sensation to light touch were intact in both upper extremities Psychiatric:   mood and affect normal, intelligible speech    Assessment & Plan:   Reactive airway disease Patient recently presented to the ED  on 8-4 for productive cough, shortness of breath, and wheezing.  As a history of recurrent pneumonia.  He was discharged with albuterol, azithromycin, and prednisone.  He has been feeling much better since discharge and has no current complaints of shortness of breath, wheezing, and cough.  He quit smoking tobacco about 15 to 20 years ago and currently smokes about 1 to 2 cigarettes/month or less.  He does currently smoke about 1-2 joints of marijuana per day.  He has an upcoming visit with pulmonology on 9-11.  -Continue to take albuterol as needed and follow-up with pulmonologist on September 11   Generalized anxiety disorder with panic attacks Patient reports that his mood is good, balanced, and he is able to sleep well.  He currently takes quetiapine and fluoxetine.  -Continue fluoxetine 40 mg daily and quetiapine 200 mg daily   HIV (human immunodeficiency virus infection) (HCC) Patient has a long history of HIV being back to his teenage years.  Currently followed by infectious disease here at [facility].  He was previously on Biktarvy and valacyclovir, but recently stopped valacyclovir and now only takes Biktarvy.  -[facility] and regular visits with [facility]   Shoulder pain, right Patient reports over a year of right shoulder pain that has recently worked worsened over the last month or so.  He denies experiencing any injuries, trauma, or falls at that time.  He describes the pain as a very deep intense  aching sensation that worsens with certain positions and it makes it difficult for him to sleep.  He says that it improves with modest activity, but then worsens afterward.  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  Pain, he takes about 10 ibuprofen 200 mg/day and he has also found gabapentin to be helpful.  Has not tried any heat pads, ice, or injections. On exam, there was significant tenderness  to palpation over the right acromioclavicular joint.  Range of motion in the right shoulder was normal, except internal rotation with adduction which was limited by pain. Strength in grip, elbow flexion and extension, and shoulder abduction strength were 5 out of 5 bilaterally.  Sensation to light touch were intact in both upper extremities. Spurling test negative. Empty can test was positive on the right negative on the left.  Differential includes subacromial impingement, rotator cuff tendinitis, and glenohumeral arthritis.  -Prescribe diclofenac gel naproxen 500 mg twice per day for several weeks while discontinuing ibuprofen -Order x-ray of right shoulder rule out arthritis and return to clinic in 2 to 3 weeks for possible injection     See Encounters Tab for problem based charting.  Patient seen with Dr. Oswaldo Done

## 2021-11-04 ENCOUNTER — Ambulatory Visit (INDEPENDENT_AMBULATORY_CARE_PROVIDER_SITE_OTHER): Payer: Medicare Other | Admitting: Student

## 2021-11-04 VITALS — BP 137/94 | HR 66 | Temp 98.5°F | Ht 74.0 in | Wt 224.7 lb

## 2021-11-04 DIAGNOSIS — Z0000 Encounter for general adult medical examination without abnormal findings: Secondary | ICD-10-CM

## 2021-11-04 DIAGNOSIS — F410 Panic disorder [episodic paroxysmal anxiety] without agoraphobia: Secondary | ICD-10-CM | POA: Diagnosis not present

## 2021-11-04 DIAGNOSIS — M25511 Pain in right shoulder: Secondary | ICD-10-CM

## 2021-11-04 DIAGNOSIS — J45909 Unspecified asthma, uncomplicated: Secondary | ICD-10-CM

## 2021-11-04 DIAGNOSIS — Z21 Asymptomatic human immunodeficiency virus [HIV] infection status: Secondary | ICD-10-CM | POA: Diagnosis not present

## 2021-11-04 DIAGNOSIS — F411 Generalized anxiety disorder: Secondary | ICD-10-CM | POA: Diagnosis not present

## 2021-11-04 MED ORDER — NAPROXEN 500 MG PO TABS: 500.0000 mg | ORAL_TABLET | Freq: Two times a day (BID) | ORAL | 0 refills | Status: DC

## 2021-11-04 MED ORDER — DICLOFENAC SODIUM 1 % EX GEL
2.0000 g | Freq: Four times a day (QID) | CUTANEOUS | 2 refills | Status: DC
Start: 2021-11-04 — End: 2021-11-04

## 2021-11-04 MED ORDER — DICLOFENAC SODIUM 1 % EX GEL: 2.0000 g | Freq: Four times a day (QID) | CUTANEOUS | 2 refills | Status: DC

## 2021-11-04 NOTE — Assessment & Plan Note (Addendum)
Patient reports that his mood is good, balanced, and he is able to sleep well.  He currently takes quetiapine and fluoxetine.  -Continue fluoxetine 40 mg daily and quetiapine 200 mg daily

## 2021-11-04 NOTE — Patient Instructions (Signed)
  Thank you, [name], for allowing [place] to provide your care today. Today we discussed . . .  > Reactive Airway Disease       - please continue to take albuterol as needed and follow up with your pulmonologist on 9-11 > Colonoscopy Screening       - we have ordered a colonoscopy for you, this is an easy way to detect the early stages of colon cancer > Shoulder Pain       - we have prescribed naproxen and diclofenac gel, which should help reduce your pain       - we have also ordered an Xray to help guide our diagnosis   I have ordered the following labs for you:  Lab Orders  No laboratory test(s) ordered today      Tests ordered today:  Right Shoulder Radiograph   Referrals ordered today:    Referral Orders         Ambulatory referral to Gastroenterology       I have ordered the following medication/changed the following medications:   Stop the following medications: Medications Discontinued During This Encounter  Medication Reason   diclofenac Sodium (VOLTAREN) 1 % GEL    naproxen (NAPROSYN) 500 MG tablet      Start the following medications: Meds ordered this encounter  Medications   DISCONTD: naproxen (NAPROSYN) 500 MG tablet    Sig: Take 1 tablet (500 mg total) by mouth 2 (two) times daily with a meal.    Dispense:  60 tablet    Refill:  0   DISCONTD: diclofenac Sodium (VOLTAREN) 1 % GEL    Sig: Apply 2 g topically 4 (four) times daily.    Dispense:  100 g    Refill:  2   diclofenac Sodium (VOLTAREN) 1 % GEL    Sig: Apply 2 g topically 4 (four) times daily.    Dispense:  100 g    Refill:  2   naproxen (NAPROSYN) 500 MG tablet    Sig: Take 1 tablet (500 mg total) by mouth 2 (two) times daily with a meal.    Dispense:  60 tablet    Refill:  0      Follow up:  2-3 weeks with Dr. Antony Contras or Dr. Oswaldo Done     Remember:  Please take naproxen 500mg  twice per day and apply the diclofenac as needed for pain relief. The Xray will help identify the  cause of your shoulder pain. We will see you in clinic in about 2-3 weeks!   Should you have any questions or concerns please call the internal medicine clinic at [phone].     [phone], MD [facility]

## 2021-11-04 NOTE — Assessment & Plan Note (Signed)
Patient has a long history of HIV being back to his teenage years.  Currently followed by infectious disease here at [facility].  He was previously on Biktarvy and valacyclovir, but recently stopped valacyclovir and now only takes Biktarvy.  -Systems analyst and regular visits with [facility]

## 2021-11-04 NOTE — Assessment & Plan Note (Signed)
Patient reports over a year of right shoulder pain that has recently worked worsened over the last month or so.  He denies experiencing any injuries, trauma, or falls at that time.  He describes the pain as a very deep intense aching sensation that worsens with certain positions and it makes it difficult for him to sleep.  He says that it improves with modest activity, but then worsens afterward.  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  Pain, he takes about 10 ibuprofen 200 mg/day and he has also found gabapentin to be helpful.  Has not tried any heat pads, ice, or injections. On exam, there was significant tenderness to palpation over the right acromioclavicular joint.  Range of motion in the right shoulder was normal, except internal rotation with adduction which was limited by pain. Strength in grip, elbow flexion and extension, and shoulder abduction strength were 5 out of 5 bilaterally.  Sensation to light touch were intact in both upper extremities. Spurling test negative. Empty can test was positive on the right negative on the left.  Differential includes subacromial impingement, rotator cuff tendinitis, and glenohumeral arthritis.  -Prescribe diclofenac gel naproxen 500 mg twice per day for several weeks while discontinuing ibuprofen -Order x-ray of right shoulder rule out arthritis and return to clinic in 2 to 3 weeks for possible injection

## 2021-11-04 NOTE — Assessment & Plan Note (Addendum)
Patient recently presented to the ED on 8-4 for productive cough, shortness of breath, and wheezing.  As a history of recurrent pneumonia.  He was discharged with albuterol, azithromycin, and prednisone.  He has been feeling much better since discharge and has no current complaints of shortness of breath, wheezing, and cough.  He quit smoking tobacco about 15 to 20 years ago and currently smokes about 1 to 2 cigarettes/month or less.  He does currently smoke about 1-2 joints of marijuana per day.  He has an upcoming visit with pulmonology on 9-11.  -Continue to take albuterol as needed and follow-up with pulmonologist on September 11

## 2021-11-05 ENCOUNTER — Encounter: Payer: Self-pay | Admitting: *Deleted

## 2021-11-05 ENCOUNTER — Ambulatory Visit: Payer: Self-pay | Admitting: *Deleted

## 2021-11-05 NOTE — Patient Outreach (Signed)
  Care Coordination   Follow Up Visit Note   11/05/2021 Name: [name] MRN: [id number] DOB: Oct 12, 1965  [name] is a 56 y.o. year old male who sees Joseph Sickles, MD for primary care. I spoke with  [name] by phone today  What matters to the patients health and wellness today?  I love my new doctor and I will follow up and work with the office and with NP to complete goals.    Goals Addressed               This Visit's Progress     Patient Stated     I agree to maintain contact with my care manager, [name] (pt-stated)   On track     11/05/20 Pt participated in scheduled call today. He expresses interest in continuing engagement and working on new goals. We agreed to set new goals after he makes his follow up primary care visit. 10/29/21 Pt was enrolled a month ago and then did not answer my calls X3. Finally was able to talk with him and advised that I cannot work with him if he doesn't answer his phone or call me back. He agrees now that he will do so. He says his phone was set so that he was not getting his calls.       Other     COMPLETED: Establish with a primary care provider   On track     Will have his new PCP visit on 8/21.  11/05/21 - Pt completed his new PCP visit on 8/21! Follow up is scheduled and gaps in care are being addressed.        SDOH assessments and interventions completed:  Yes  SDOH Interventions Today    Flowsheet Row Most Recent Value  SDOH Interventions   Food Insecurity Interventions Intervention Not Indicated  Transportation Interventions Intervention Not Indicated        Care Coordination Interventions Activated:  Yes  Care Coordination Interventions:  Yes, provided   Follow up plan: Follow up call scheduled for 1 month.    Encounter Outcome:  Pt. Visit Completed   Joseph Pick C. Burgess [name], MSN, [facility] Gerontological Nurse Practitioner [facility] Care Management [phone]

## 2021-11-06 ENCOUNTER — Other Ambulatory Visit: Payer: Self-pay | Admitting: Internal Medicine

## 2021-11-06 DIAGNOSIS — R12 Heartburn: Secondary | ICD-10-CM

## 2021-11-07 NOTE — Progress Notes (Signed)
Internal Medicine Clinic Attending  I saw and evaluated the patient.  I personally confirmed the key portions of the history and exam documented by Dr. Harper and I reviewed pertinent patient test results.  The assessment, diagnosis, and plan were formulated together and I agree with the documentation in the resident's note.  

## 2021-11-25 ENCOUNTER — Institutional Professional Consult (permissible substitution): Payer: Medicare Other | Admitting: Internal Medicine

## 2021-11-26 ENCOUNTER — Other Ambulatory Visit: Payer: Self-pay | Admitting: Student

## 2021-11-26 ENCOUNTER — Other Ambulatory Visit: Payer: Self-pay | Admitting: Internal Medicine

## 2021-11-26 DIAGNOSIS — R12 Heartburn: Secondary | ICD-10-CM

## 2021-11-26 NOTE — Telephone Encounter (Signed)
Appt 9/14

## 2021-11-28 ENCOUNTER — Ambulatory Visit: Payer: Medicare Other | Admitting: Family

## 2021-11-28 NOTE — Telephone Encounter (Signed)
Appt changed to 9/18

## 2021-11-28 NOTE — Telephone Encounter (Signed)
Appt changed to 9/18 

## 2021-12-02 ENCOUNTER — Ambulatory Visit: Payer: Medicare Other | Admitting: Family

## 2021-12-03 NOTE — Telephone Encounter (Signed)
Please advise on refill.

## 2021-12-03 NOTE — Telephone Encounter (Signed)
Please advise on refill/ missed follow up appt

## 2021-12-05 ENCOUNTER — Encounter: Payer: Self-pay | Admitting: *Deleted

## 2021-12-05 ENCOUNTER — Telehealth: Payer: Self-pay | Admitting: *Deleted

## 2021-12-05 NOTE — Patient Outreach (Signed)
  Care Coordination   12/05/2021 Name: [name] MRN: [id number] DOB: [birth year]   Care Coordination Outreach Attempts:  An unsuccessful telephone outreach was attempted today to offer the patient information about available care coordination services as a benefit of their health plan.   Follow Up Plan:  Additional outreach attempts will be made to offer the patient care coordination information and services.   Encounter Outcome:  No Answer  Care Coordination Interventions Activated:  No   Care Coordination Interventions:  No, not indicated    SIG Natsha Guidry C. Myrtie Neither, MSN, [facility] Gerontological Nurse Practitioner [facility] Care Management [phone]

## 2021-12-16 ENCOUNTER — Ambulatory Visit: Payer: Self-pay | Admitting: *Deleted

## 2021-12-16 NOTE — Patient Outreach (Signed)
  Care Coordination   Follow Up Visit Note   12/16/2021 Name: [name] MRN: [id number] DOB: March 23, 1965  [name] is a 56 y.o. year old male who sees Joseph Butcher, MD for primary care. I spoke with  [name] by phone today.  What matters to the patients health and wellness today?  Pain relief.   Goals Addressed               This Visit's Progress     Patient Stated     I agree to maintain contact with my care manager, [name] (pt-stated)   On track     Care Coordination Interventions: Pt participated in his follow up call today. He agrees to continue to do so. He says he looks forward to our conversations. Will continue this goal as reinforcement for his active participation in care management.      I want OA pain relief. (pt-stated)        Care Coordination Interventions: Assessed pt follow through on new pain medication regimen (Naproxen 500 mg bid) and reduction in ibuprofen: Pt states the naproxen didn't help much and it made him constipated. He has stopped it and has resumed taking ibuprofen he says "about 10 200mg  tabs at a time 4-6 times a day. Advised that that practice is overuse of the medication and can cause GI bleeding and kidney failure especially at those doses. He states he knows that but it is the only way he can get any pain relief. He states I will do what I need to do to feel better. NP attempted many times to discourage him from his practice as this could have LIFE threatening results. Encouraged him to try using an ice pack, an easy way is to use a bag of frozen peas that can conform to his shoulder. Pt jokes and says he wants NP to come apply the ice pack and then he would do it.        SDOH assessments and interventions completed:  No   Care Coordination Interventions Activated:  Yes  Care Coordination Interventions:  Yes, provided   Follow up plan: Follow up call scheduled for 1 month    Encounter Outcome:  Pt. Visit  Completed   Joseph Memos C. Myrtie Neither, MSN, [facility] Gerontological Nurse Practitioner [facility] Care Management [phone]

## 2021-12-20 NOTE — Progress Notes (Deleted)
Synopsis: Referred in October 2023 for cough, wheezing, recurrent pneumonia.  Concern for asthma and former smoker who as of 10/2021 reportedly smokes marijuana 1-2 times a day..  Subjective:   PATIENT ID: [name] GENDER: male DOB: Mar 25, 1965, MRN: [id number]   HPI  No chief complaint on file.   *** August 2023 primary care records reviewed where the patient was seen for follow-up after an ER visit for pneumonia.  He had been discharged with azithromycin prednisone and albuterol.  He was feeling better.  He reportedly smoked marijuana 1-2 times a day and 1 to 2 cigarettes tobacco a month.  Past Medical History:  Diagnosis Date   Anxiety    Arthritis    Asthma    Depression    History of Clostridium difficile colitis 04/22/2017   History of pulmonary embolus (PE) 03/17/2013   Overview:  post hip surgery, on blood thinners at the time   HIV (human immunodeficiency virus infection) ([place])    Hypokalemia    Hypotension 01/25/2015   Immune deficiency disorder ([place])    Infectious folliculitis 99991111   MRSA (methicillin resistant staph aureus) culture positive    Sepsis ([place])    Streptococcal pneumonia ([place])      Family History  Problem Relation Age of Onset   Cancer Mother    CAD Brother      Social History   Socioeconomic History   Marital status: Divorced    Spouse name: Not on file   Number of children: 5   Years of education: 11   Highest education level: Not on file  Occupational History    Comment: na  Tobacco Use   Smoking status: Former    Types: Cigarettes   Smokeless tobacco: Current    Types: Nurse, children's Use: Never used  Substance and Sexual Activity   Alcohol use: Yes    Comment: daily to occasionally   Drug use: Yes    Types: Marijuana    Comment: socially   Sexual activity: Not on file    Comment: declined condoms  Other Topics Concern   Not on file  Social History Narrative   Lives alone   Social Determinants of  Health   Financial Resource Strain: Not on file  Food Insecurity: No Food Insecurity (11/05/2021)   Hunger Vital Sign    Worried About Running Out of Food in the Last Year: Never true    Ran Out of Food in the Last Year: Never true  Transportation Needs: No Transportation Needs (11/05/2021)   PRAPARE - Transportation    Lack of Transportation (Medical): No    Lack of Transportation (Non-Medical): No  Physical Activity: Not on file  Stress: Not on file  Social Connections: Not on file  Intimate Partner Violence: Not on file     Allergies  Allergen Reactions   Hydrocodone Other (See Comments)    "makes me overly sleepy"   Sulfa Antibiotics Rash   Tramadol Nausea Only     Outpatient Medications Prior to Visit  Medication Sig Dispense Refill   albuterol (VENTOLIN HFA) 108 (90 Base) MCG/ACT inhaler Inhale 2 puffs into the lungs every 6 (six) hours as needed for wheezing or shortness of breath. 54 g 5   bictegravir-emtricitabine-tenofovir AF (BIKTARVY) 50-200-25 MG TABS tablet Take 1 tablet by mouth daily. 30 tablet 5   diclofenac Sodium (VOLTAREN) 1 % GEL Apply 2 g topically 4 (four) times daily. 100 g 2   FLUoxetine (PROZAC)  40 MG capsule Take 1 capsule (40 mg total) by mouth daily. 30 capsule 3   gabapentin (NEURONTIN) 600 MG tablet TAKE 2 TABLETS (1,200 MG TOTAL) BY MOUTH 3 (THREE) TIMES DAILY. 180 tablet 3   ketoconazole (NIZORAL) 2 % cream APPLY 1 APPLICATION. TOPICALLY DAILY. ON RASH ON BOTTOM AND ABDOMEN 15 g 0   naproxen (NAPROSYN) 500 MG tablet TAKE 1 TABLET BY MOUTH 2 TIMES DAILY WITH A MEAL. 60 tablet 0   omeprazole (PRILOSEC) 20 MG capsule TAKE 1 CAPSULE BY MOUTH EVERY DAY 90 capsule 0   QUEtiapine (SEROQUEL) 200 MG tablet Take 1 tablet (200 mg total) by mouth at bedtime. 30 tablet 3   sildenafil (VIAGRA) 50 MG tablet TAKE 1/2 (ONE-HALF) TABLET BY MOUTH ONCE DAILY AS NEEDED 10 tablet 0   triamcinolone ointment (KENALOG) 0.5 % Apply 1 application. topically 2 (two) times  daily. 30 g 3   umeclidinium-vilanterol (ANORO ELLIPTA) 62.5-25 MCG/ACT AEPB Inhale 1 puff into the lungs daily. 60 each 2   valACYclovir (VALTREX) 500 MG tablet Take 1 tablet (500 mg total) by mouth daily. 30 tablet 11   No facility-administered medications prior to visit.    ROS    Objective:  Physical Exam   There were no vitals filed for this visit.  ***  CBC    Component Value Date/Time   WBC 4.1 09/26/2021 1148   RBC 4.43 09/26/2021 1148   HGB 13.8 09/26/2021 1148   HCT 39.6 09/26/2021 1148   PLT 165 09/26/2021 1148   MCV 89.4 09/26/2021 1148   MCH 31.2 09/26/2021 1148   MCHC 34.8 09/26/2021 1148   RDW 15.7 (H) 09/26/2021 1148   LYMPHSABS 1,460 09/26/2021 1148   MONOABS 0.6 02/25/2019 1415   EOSABS 221 09/26/2021 1148   BASOSABS 21 09/26/2021 1148     Chest imaging: August 2020 CT angiogram chest images independently reviewed showing no pulmonary embolism, patchy groundglass in the lingula, thoracic aortic aneurysm 4.2 cm  PFT:  Labs: July 2023 CD4 count 310, HIV viral load undetectable  Path:  Echo:  Heart Catheterization:       Assessment & Plan:   No diagnosis found.  Discussion: ***  Immunizations: Immunization History  Administered Date(s) Administered   Hep A / Hep B 06/18/2009   Hepb-cpg 10/16/2020, 11/12/2020, 03/28/2021   Influenza Inj Mdck Quad Pf 03/09/2013, 12/05/2014, 12/21/2015   Influenza, Seasonal, Injecte, Preservative Fre 12/05/2014   Influenza,inj,Quad PF,6+ Mos 01/31/2017, 01/13/2018, 02/26/2021   Influenza,inj,quad, With Preservative 03/09/2013, 12/05/2014, 12/21/2015   PPD Test 03/07/2013   Pneumococcal Conjugate-13 09/05/2020   Pneumococcal Polysaccharide-23 04/05/2013   Tdap 09/26/2021     Current Outpatient Medications:    albuterol (VENTOLIN HFA) 108 (90 Base) MCG/ACT inhaler, Inhale 2 puffs into the lungs every 6 (six) hours as needed for wheezing or shortness of breath., Disp: 54 g, Rfl: 5    bictegravir-emtricitabine-tenofovir AF (BIKTARVY) 50-200-25 MG TABS tablet, Take 1 tablet by mouth daily., Disp: 30 tablet, Rfl: 5   diclofenac Sodium (VOLTAREN) 1 % GEL, Apply 2 g topically 4 (four) times daily., Disp: 100 g, Rfl: 2   FLUoxetine (PROZAC) 40 MG capsule, Take 1 capsule (40 mg total) by mouth daily., Disp: 30 capsule, Rfl: 3   gabapentin (NEURONTIN) 600 MG tablet, TAKE 2 TABLETS (1,200 MG TOTAL) BY MOUTH 3 (THREE) TIMES DAILY., Disp: 180 tablet, Rfl: 3   ketoconazole (NIZORAL) 2 % cream, APPLY 1 APPLICATION. TOPICALLY DAILY. ON RASH ON BOTTOM AND ABDOMEN, Disp: 15 g, Rfl:  0   naproxen (NAPROSYN) 500 MG tablet, TAKE 1 TABLET BY MOUTH 2 TIMES DAILY WITH A MEAL., Disp: 60 tablet, Rfl: 0   omeprazole (PRILOSEC) 20 MG capsule, TAKE 1 CAPSULE BY MOUTH EVERY DAY, Disp: 90 capsule, Rfl: 0   QUEtiapine (SEROQUEL) 200 MG tablet, Take 1 tablet (200 mg total) by mouth at bedtime., Disp: 30 tablet, Rfl: 3   sildenafil (VIAGRA) 50 MG tablet, TAKE 1/2 (ONE-HALF) TABLET BY MOUTH ONCE DAILY AS NEEDED, Disp: 10 tablet, Rfl: 0   triamcinolone ointment (KENALOG) 0.5 %, Apply 1 application. topically 2 (two) times daily., Disp: 30 g, Rfl: 3   umeclidinium-vilanterol (ANORO ELLIPTA) 62.5-25 MCG/ACT AEPB, Inhale 1 puff into the lungs daily., Disp: 60 each, Rfl: 2   valACYclovir (VALTREX) 500 MG tablet, Take 1 tablet (500 mg total) by mouth daily., Disp: 30 tablet, Rfl: 11

## 2021-12-24 ENCOUNTER — Institutional Professional Consult (permissible substitution): Payer: Medicare Other | Admitting: Pulmonary Disease

## 2022-01-09 ENCOUNTER — Telehealth: Payer: Self-pay

## 2022-01-09 NOTE — Telephone Encounter (Signed)
No Show PV today. Message left on pt's contact # to please call back before 5pm today to reschedule PV. If no call back today, will need to cancel PV and colonoscopy.

## 2022-01-09 NOTE — Telephone Encounter (Signed)
No call back to reschedule PV. (& no PV slots available before scheduled colonoscopy.) Letter to pt encouraging call back to reschedule. PV 10/26 & [facility]-Colon 01/17/22 cancelled per protocol.

## 2022-01-15 ENCOUNTER — Other Ambulatory Visit: Payer: Self-pay | Admitting: Family

## 2022-01-15 ENCOUNTER — Telehealth: Payer: Self-pay | Admitting: *Deleted

## 2022-01-15 ENCOUNTER — Encounter: Payer: Self-pay | Admitting: *Deleted

## 2022-01-15 NOTE — Patient Outreach (Signed)
  Care Coordination   01/15/2022 Name: VERBON GIANGREGORIO MRN: [id number] DOB: October 19, 1965   Care Coordination Outreach Attempts:  An unsuccessful telephone outreach was attempted today to offer the patient information about available care coordination services as a benefit of their health plan.   Follow Up Plan:  Additional outreach attempts will be made to offer the patient care coordination information and services.   Encounter Outcome:  No Answer  Care Coordination Interventions Activated:  No   Care Coordination Interventions:  No, not indicated    SIG Kegan Mckeithan C. Myrtie Neither, MSN, [facility] Gerontological Nurse Practitioner [facility] Care Management [phone]

## 2022-01-15 NOTE — Telephone Encounter (Signed)
Ok to refill 

## 2022-01-17 ENCOUNTER — Ambulatory Visit: Payer: Self-pay | Admitting: *Deleted

## 2022-01-17 ENCOUNTER — Encounter: Payer: Medicare Other | Admitting: Gastroenterology

## 2022-01-17 ENCOUNTER — Telehealth: Payer: Self-pay | Admitting: *Deleted

## 2022-01-17 NOTE — Patient Outreach (Signed)
  Care Coordination   01/17/2022 Name: [name] MRN: [id number] DOB: [birth year]   Care Coordination Outreach Attempts:  A second unsuccessful outreach was attempted today to offer the patient with information about available care coordination services as a benefit of their health plan.     Follow Up Plan:  Additional outreach attempts will be made to offer the patient care coordination information and services.   Encounter Outcome:  No Answer  Care Coordination Interventions Activated:  No   Care Coordination Interventions:  No, not indicated    SIG Keaghan Staton C. Myrtie Neither, MSN, [facility] Gerontological Nurse Practitioner [facility] Care Management [phone]

## 2022-01-17 NOTE — Patient Outreach (Signed)
  Care Coordination   01/20/2022 Name: [name] MRN: [id number] DOB: 1966/01/27   Care Coordination Outreach Attempts:  A third unsuccessful outreach was attempted today to offer the patient with information about available care coordination services as a benefit of their health plan.   Follow Up Plan:  No further outreach attempts will be made at this time. We have been unable to contact the patient to offer or enroll patient in care coordination services  Encounter Outcome:  No Answer  Care Coordination Interventions Activated:  No   Care Coordination Interventions:  No, not indicated    SIG Sanda Dejoy C. Myrtie Neither, MSN, [facility] Gerontological Nurse Practitioner [facility] Care Management [phone]

## 2022-01-20 ENCOUNTER — Telehealth: Payer: Self-pay | Admitting: *Deleted

## 2022-01-20 ENCOUNTER — Encounter: Payer: Self-pay | Admitting: *Deleted

## 2022-01-20 NOTE — Patient Outreach (Signed)
  Care Coordination   01/20/2022 Name: [name] MRN: [id number] DOB: 1965-08-19   Care Coordination Outreach Attempts:  A third unsuccessful outreach was attempted today to offer the patient with information about available care coordination services as a benefit of their health plan.    Goals Addressed               This Visit's Progress     Patient Stated     COMPLETED: I agree to maintain contact with my care manager, Deloria Lair (pt-stated)   Not on track     Care Coordination Interventions: Evaluation of current treatment plan related to current goals and patient's adherence to plan as established by provider Pt only returns calls on third and final attempts. He is nomaintaining contact with NP.        COMPLETED: I want OA pain relief. (pt-stated)   Not on track     [place] PT. Care Coordination Interventions: Assessed pt follow through on new pain medication regimen (Naproxen 500 mg bid) and reduction in ibuprofen: Pt states the naproxen didn't help much and it made him constipated. He has stopped it and has resumed taking ibuprofen he says "about 10 200mg  tabs at a time 4-6 times a day. Advised that that practice is overuse of the medication and can cause GI bleeding and kidney failure especially at those doses. He states he knows that but it is the only way he can get any pain relief. He states I will do what I need to do to feel better. NP attempted many times to discourage him from his practice as this could have LIFE threatening results. Encouraged him to try using an ice pack, an easy way is to use a bag of frozen peas that can conform to his shoulder. Pt jokes and says he wants NP to come apply the ice pack and then he would do it.         Follow Up Plan:  No further outreach attempts will be made at this time. We have been unable to contact the patient to offer or enroll patient in care coordination  services  Encounter Outcome:  No Answer   Care Coordination Interventions Activated:  No   Care Coordination Interventions:  No, not indicated    SIG Darletta Noblett C. Myrtie Neither, MSN, [facility] Gerontological Nurse Practitioner [facility] Care Management [phone]

## 2022-01-27 ENCOUNTER — Other Ambulatory Visit: Payer: Self-pay | Admitting: Family

## 2022-01-27 DIAGNOSIS — R12 Heartburn: Secondary | ICD-10-CM

## 2022-01-27 DIAGNOSIS — K2100 Gastro-esophageal reflux disease with esophagitis, without bleeding: Secondary | ICD-10-CM

## 2022-01-27 NOTE — Telephone Encounter (Signed)
Please advise on rx.

## 2022-01-28 ENCOUNTER — Other Ambulatory Visit: Payer: Self-pay | Admitting: Family

## 2022-01-28 NOTE — Telephone Encounter (Signed)
Okay to refill? Or should patient reach out to PCP?

## 2022-02-25 ENCOUNTER — Other Ambulatory Visit: Payer: Self-pay

## 2022-02-25 ENCOUNTER — Encounter (INDEPENDENT_AMBULATORY_CARE_PROVIDER_SITE_OTHER): Payer: Self-pay | Admitting: *Deleted

## 2022-02-25 VITALS — BP 134/87 | HR 57 | Temp 97.6°F | Wt 235.1 lb

## 2022-02-25 DIAGNOSIS — Z006 Encounter for examination for normal comparison and control in clinical research program: Secondary | ICD-10-CM

## 2022-02-25 NOTE — Research (Signed)
[name] is here for his final visit for [phone], the BeeHIVe study. No new complaints. States that he saw Marcos Eke, NP in July and was switched to [place], [place] that he did not feel the Susanne Borders was working because he "did not feel anything" so he switched back to his previous regimen of Symtuza and Tivicay about a month ago. Scheduled him a follow up visit with [name] for 03/18/22 to discuss his medications. He is interested in participating in future studies. I told him that we would reach out as new studies become available.

## 2022-03-18 ENCOUNTER — Ambulatory Visit (INDEPENDENT_AMBULATORY_CARE_PROVIDER_SITE_OTHER): Payer: Medicare Other | Admitting: Family

## 2022-03-18 ENCOUNTER — Encounter: Payer: Self-pay | Admitting: Family

## 2022-03-18 ENCOUNTER — Other Ambulatory Visit: Payer: Self-pay

## 2022-03-18 VITALS — BP 152/99 | HR 74 | Temp 98.2°F | Resp 16 | Ht 73.0 in | Wt 231.2 lb

## 2022-03-18 DIAGNOSIS — R200 Anesthesia of skin: Secondary | ICD-10-CM | POA: Diagnosis not present

## 2022-03-18 DIAGNOSIS — F332 Major depressive disorder, recurrent severe without psychotic features: Secondary | ICD-10-CM | POA: Diagnosis not present

## 2022-03-18 DIAGNOSIS — B20 Human immunodeficiency virus [HIV] disease: Secondary | ICD-10-CM

## 2022-03-18 DIAGNOSIS — K2100 Gastro-esophageal reflux disease with esophagitis, without bleeding: Secondary | ICD-10-CM | POA: Diagnosis not present

## 2022-03-18 DIAGNOSIS — R21 Rash and other nonspecific skin eruption: Secondary | ICD-10-CM

## 2022-03-18 DIAGNOSIS — J45909 Unspecified asthma, uncomplicated: Secondary | ICD-10-CM | POA: Diagnosis not present

## 2022-03-18 DIAGNOSIS — F17220 Nicotine dependence, chewing tobacco, uncomplicated: Secondary | ICD-10-CM

## 2022-03-18 DIAGNOSIS — Z0000 Encounter for general adult medical examination without abnormal findings: Secondary | ICD-10-CM

## 2022-03-18 MED ORDER — OMEPRAZOLE 20 MG PO CPDR: DELAYED_RELEASE_CAPSULE | ORAL | 1 refills | Status: DC

## 2022-03-18 MED ORDER — ANORO ELLIPTA 62.5-25 MCG/ACT IN AEPB: 1.0000 | INHALATION_SPRAY | Freq: Every day | RESPIRATORY_TRACT | 5 refills | Status: DC

## 2022-03-18 MED ORDER — TRIAMCINOLONE ACETONIDE 0.5 % EX OINT: 1.0000 | TOPICAL_OINTMENT | Freq: Two times a day (BID) | CUTANEOUS | 3 refills | Status: AC

## 2022-03-18 MED ORDER — ALBUTEROL SULFATE HFA 108 (90 BASE) MCG/ACT IN AERS: 2.0000 | INHALATION_SPRAY | Freq: Four times a day (QID) | RESPIRATORY_TRACT | 5 refills | Status: DC | PRN

## 2022-03-18 MED ORDER — BIKTARVY 50-200-25 MG PO TABS: 1.0000 | ORAL_TABLET | Freq: Every day | ORAL | 5 refills | Status: DC

## 2022-03-18 MED ORDER — QUETIAPINE FUMARATE 200 MG PO TABS: 200.0000 mg | ORAL_TABLET | Freq: Every day | ORAL | 1 refills | Status: DC

## 2022-03-18 MED ORDER — GABAPENTIN 600 MG PO TABS: 1200.0000 mg | ORAL_TABLET | Freq: Three times a day (TID) | ORAL | 3 refills | Status: DC

## 2022-03-18 MED ORDER — FLUOXETINE HCL 40 MG PO CAPS: 40.0000 mg | ORAL_CAPSULE | Freq: Every day | ORAL | 1 refills | Status: DC

## 2022-03-18 NOTE — Patient Instructions (Signed)
Nice to see you.  We will check your lab work today.  Continue to take your medication daily as prescribed.  Refills have been sent to the pharmacy.  Plan for follow up in 2 months or sooner if needed with lab work on the same day.  Have a great day and stay safe!  

## 2022-03-18 NOTE — Assessment & Plan Note (Signed)
Continues to have neuropathic pain that is adequately managed with current dose of gabapentin. Continue current dose of gabapentin.

## 2022-03-18 NOTE — Progress Notes (Signed)
Brief Narrative   Patient ID: UZZIAH RIGG, male    DOB: [birth year], 57 y.o.   MRN: [id number]  Mr. Kras is a 57 y/o caucasian male with HIV disease diagnosed in 1995 with risk factor of sexual contact. Initial viral load and CD4 count are not available. Genotype from 03/07/13 with no significant medication resistant mutations.No history of opportunistic infection. Previous ART experience with nelfinavir, combivir, truvada, tenofovir, raltegravir, atazanavir, ritonivir, Symtuza and Tivicay.    Subjective:    Chief Complaint  Patient presents with   Follow-up    B20     HPI:  KERRI ASCHE is a 57 y.o. male with HIV disease last seen on 10/28/21 with well controlled virus and good adherence and tolerance to Biktarvy. Viral load was 71 with CD4 count 301. Mood was well controlled with quetiapine and fluoxetine. Had increased issues with shortness of breath and albuterol use and was started on Anoro with referral to Pulmonology for further evaluation. Here today for follow up.  Mose has been doing well since his last office visit and continues to have issues with breathing and needs his albuterol especially if he is doing work outside. Anoro is helping but has run out of medication. Was unable to see Pulmonology secondary to attending his appointment and was left in the waiting room and was unable to stay until he was able to be seen. Marked no-show on appointment. Taking [place] as prescribed with no adverse side effects and no problems obtaining from the pharmacy. Mood has remained stable. Continues to have a rash located on his stomach that is improved. Condoms and STD testing offered. Declines vaccinations.   Denies fevers, chills, night sweats, headaches, changes in vision, neck pain/stiffness, nausea, diarrhea, vomiting, lesions or rashes.     Allergies  Allergen Reactions   Hydrocodone Other (See Comments)    "makes me overly sleepy"   Sulfa Antibiotics Rash    Tramadol Nausea Only      Outpatient Medications Prior to Visit  Medication Sig Dispense Refill   diclofenac Sodium (VOLTAREN) 1 % GEL Apply 2 g topically 4 (four) times daily. 100 g 2   ketoconazole (NIZORAL) 2 % cream APPLY 1 APPLICATION. TOPICALLY DAILY. ON RASH ON BOTTOM AND ABDOMEN 15 g 0   naproxen (NAPROSYN) 500 MG tablet TAKE 1 TABLET BY MOUTH 2 TIMES DAILY WITH A MEAL. 60 tablet 0   sildenafil (VIAGRA) 50 MG tablet TAKE 1/2 (ONE-HALF) TABLET BY MOUTH ONCE DAILY AS NEEDED 10 tablet 0   valACYclovir (VALTREX) 500 MG tablet Take 1 tablet (500 mg total) by mouth daily. 30 tablet 11   albuterol (VENTOLIN HFA) 108 (90 Base) MCG/ACT inhaler Inhale 2 puffs into the lungs every 6 (six) hours as needed for wheezing or shortness of breath. 54 g 5   bictegravir-emtricitabine-tenofovir AF (BIKTARVY) 50-200-25 MG TABS tablet Take 1 tablet by mouth daily. 30 tablet 5   FLUoxetine (PROZAC) 40 MG capsule TAKE 1 CAPSULE (40 MG TOTAL) BY MOUTH DAILY. 30 capsule 3   gabapentin (NEURONTIN) 600 MG tablet TAKE 2 TABLETS (1,200 MG TOTAL) BY MOUTH 3 (THREE) TIMES DAILY. 180 tablet 3   omeprazole (PRILOSEC) 20 MG capsule TAKE 1 CAPSULE BY MOUTH EVERY DAY 90 capsule 0   QUEtiapine (SEROQUEL) 200 MG tablet TAKE 1 TABLET BY MOUTH AT BEDTIME. 30 tablet 3   triamcinolone ointment (KENALOG) 0.5 % Apply 1 application. topically 2 (two) times daily. 30 g 3   umeclidinium-vilanterol (ANORO ELLIPTA) 62.5-25 MCG/ACT  AEPB Inhale 1 puff into the lungs daily. 60 each 2   No facility-administered medications prior to visit.     Past Medical History:  Diagnosis Date   Anxiety    Arthritis    Asthma    Depression    History of Clostridium difficile colitis 04/22/2017   History of pulmonary embolus (PE) 03/17/2013   Overview:  post hip surgery, on blood thinners at the time   HIV (human immunodeficiency virus infection) (HCC)    Hypokalemia    Hypotension 01/25/2015   Immune deficiency disorder (HCC)    Infectious  folliculitis 01/30/2017   MRSA (methicillin resistant staph aureus) culture positive    Sepsis (HCC)    Streptococcal pneumonia [facility])      Past Surgical History:  Procedure Laterality Date   hip arthroplasty Left 2016   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, I&D with replacement in 2017   TOTAL HIP ARTHROPLASTY Right 02/09/2018   Procedure: TOTAL HIP ARTHROPLASTY ANTERIOR APPROACH;  Surgeon: Durene Romans, MD;  Location: [facility];  Service: Orthopedics;  Laterality: Right;  70 mins      Review of Systems  Constitutional:  Negative for appetite change, chills, fatigue, fever and unexpected weight change.  Eyes:  Negative for visual disturbance.  Respiratory:  Negative for cough, chest tightness, shortness of breath and wheezing.   Cardiovascular:  Negative for chest pain and leg swelling.  Gastrointestinal:  Negative for abdominal pain, constipation, diarrhea, nausea and vomiting.  Genitourinary:  Negative for dysuria, flank pain, frequency, genital sores, hematuria and urgency.  Skin:  Positive for rash.  Allergic/Immunologic: Negative for immunocompromised state.  Neurological:  Negative for dizziness and headaches.      Objective:    BP (!) 152/99   Pulse 74   Temp 98.2 F (36.8 C) (Oral)   Resp 16   Ht 6\' 1"  (1.854 m)   Wt 231 lb 3.2 oz (104.9 kg)   SpO2 97%   BMI 30.50 kg/m  Nursing note and vital signs reviewed.  Physical Exam Constitutional:      General: He is not in acute distress.    Appearance: He is well-developed.  Eyes:     Conjunctiva/sclera: Conjunctivae normal.  Cardiovascular:     Rate and Rhythm: Normal rate and regular rhythm.     Heart sounds: Normal heart sounds. No murmur heard.    No friction rub. No gallop.  Pulmonary:     Effort: Pulmonary effort is normal. No respiratory distress.     Breath sounds: Normal breath sounds. No wheezing or rales.  Chest:     Chest wall: No tenderness.  Abdominal:     General: Bowel sounds are normal.      Palpations: Abdomen is soft.     Tenderness: There is no abdominal tenderness.  Musculoskeletal:     Cervical back: Neck supple.  Lymphadenopathy:     Cervical: No cervical adenopathy.  Skin:    General: Skin is warm and dry.     Findings: No rash.  Neurological:     Mental Status: He is alert and oriented to person, place, and time.  Psychiatric:        Behavior: Behavior normal.        Thought Content: Thought content normal.        Judgment: Judgment normal.         03/18/2022   11:03 AM 11/04/2021    3:52 PM 10/28/2021   10:45 AM 02/26/2021   11:07 AM 04/05/2018  4:24 PM  Depression screen PHQ 2/9  Decreased Interest 0  0 0 0  Down, Depressed, Hopeless 0 0 0 1 0  PHQ - 2 Score 0 0 0 1 0  Altered sleeping  0     Tired, decreased energy  1     Change in appetite  0     Feeling bad or failure about yourself   0     Trouble concentrating  0     Moving slowly or fidgety/restless  0     Suicidal thoughts  0     PHQ-9 Score  1          Assessment & Plan:    Patient Active Problem List   Diagnosis Date Noted   HIV (human immunodeficiency virus infection) (HCC) 01/24/2015    Priority: High   Rash and nonspecific skin eruption 03/18/2022   Reactive airway disease 11/04/2021   Shoulder pain, right 11/04/2021   Alcohol abuse 09/26/2021   Erysipelas of left lower extremity 10/24/2020   Mood swings 10/24/2020   Erectile dysfunction 04/11/2019   Generalized anxiety disorder with panic attacks 10/25/2018   Anxiety about health 10/25/2018   Healthcare maintenance 07/06/2018   Numbness of right lower extremity 07/06/2018   Heartburn 07/06/2018   S/P right THA, AA 02/09/2018   S/P hip replacement 02/09/2018   Essential hypertension    Lung abscess (HCC) 06/21/2017   Lung nodule, multiple 06/14/2017   Asthma 04/22/2017   Complication of implanted device 07/24/2015   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/2015   Severe episode of recurrent major depressive disorder, without  psychotic features (HCC) 04/27/2015   GERD (gastroesophageal reflux disease) 01/25/2015   Primary osteoarthritis of both hips 03/29/2013   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  I am concerned about the amount of albuterol he is using and potential for underlying COPD and with poorly controlled respiratory disease.  Refill Anoro.  Continue current dose of albuterol as needed with new referral placed to pulmonology for further evaluation and treatment.      Relevant Medications   umeclidinium-vilanterol (ANORO ELLIPTA) 62.5-25 MCG/ACT AEPB   albuterol (VENTOLIN HFA) 108 (90 Base) MCG/ACT inhaler   Other Relevant Orders   Ambulatory referral to Pulmonology     Digestive   GERD (gastroesophageal reflux disease)    Reflux is well-controlled with current dose of omeprazole.  Reminded of lifestyle management and food related triggers.  Continue current dose of omeprazole.      Relevant Medications   omeprazole (PRILOSEC) 20 MG capsule     Musculoskeletal and Integument   Rash and nonspecific skin eruption    Jyles's abdominal rash appears improved with triamcinolone cream. Continue for now. May need referral to dermatology if does not improve.         Other   HIV (human immunodeficiency virus infection) (HCC) - Primary    Kassim continues to have well-controlled virus with good adherence and tolerance to [employer].  Reviewed previous lab work and discussed plan of care.  Check blood work today.  Continue current dose of Biktarvy.  Plan for follow-up in 2 months or sooner if needed with lab work on the same day.      Relevant Medications   bictegravir-emtricitabine-tenofovir AF (BIKTARVY) 50-200-25 MG TABS tablet  Other Relevant Orders   COMPLETE METABOLIC PANEL WITH GFR   HIV-1 RNA quant-no reflex-bld   T-helper  cell (CD4)- ([facility] clinic only)   Severe episode of recurrent major depressive disorder, without psychotic features ([place])    Ervie's mood has remained stable with current dose of quetiapine and fluoxetine.  No suicidal ideations and or signs of psychosis.  Has gained some weight which is likely related to his psychiatric as well as HIV medications.  Continue current dose of quetiapine and fluoxetine.      Relevant Medications   FLUoxetine (PROZAC) 40 MG capsule   Healthcare maintenance    Discussed importance of safe sexual practice and condom use. Condoms and STD testing offered.  Declines vaccinations      Numbness of right lower extremity    Continues to have neuropathic pain that is adequately managed with current dose of gabapentin. Continue current dose of gabapentin.       Relevant Medications   gabapentin (NEURONTIN) 600 MG tablet     I have changed Rolland Porter. Palmateer's QUEtiapine and triamcinolone ointment. I am also having him maintain his valACYclovir, sildenafil, diclofenac Sodium, naproxen, ketoconazole, Anoro Ellipta, albuterol, Biktarvy, FLUoxetine, gabapentin, and omeprazole.   Meds ordered this encounter  Medications   umeclidinium-vilanterol (ANORO ELLIPTA) 62.5-25 MCG/ACT AEPB    Sig: Inhale 1 puff into the lungs daily.    Dispense:  60 each    Refill:  5    Order Specific Question:   Supervising Provider    Answer:   Baxter Flattery, CYNTHIA [[id number]]   albuterol (VENTOLIN HFA) 108 (90 Base) MCG/ACT inhaler    Sig: Inhale 2 puffs into the lungs every 6 (six) hours as needed for wheezing or shortness of breath.    Dispense:  54 g    Refill:  5    Order Specific Question:   Supervising Provider    Answer:   Baxter Flattery, CYNTHIA [[id number]]   bictegravir-emtricitabine-tenofovir AF (BIKTARVY) 50-200-25 MG TABS tablet    Sig: Take 1 tablet by mouth daily.    Dispense:  30 tablet    Refill:  5    Order Specific Question:   Supervising Provider    Answer:   Baxter Flattery, CYNTHIA [[id number]]    FLUoxetine (PROZAC) 40 MG capsule    Sig: Take 1 capsule (40 mg total) by mouth daily.    Dispense:  90 capsule    Refill:  1    Order Specific Question:   Supervising Provider    Answer:   Baxter Flattery, CYNTHIA [[id number]]   gabapentin (NEURONTIN) 600 MG tablet    Sig: Take 2 tablets (1,200 mg total) by mouth 3 (three) times daily.    Dispense:  180 tablet    Refill:  3    Order Specific Question:   Supervising Provider    Answer:   Baxter Flattery, CYNTHIA [[id number]]   omeprazole (PRILOSEC) 20 MG capsule    Sig: TAKE 1 CAPSULE BY MOUTH EVERY DAY    Dispense:  90 capsule    Refill:  1    DX Code Needed  .    Order Specific Question:   Supervising Provider    Answer:   Carlyle Basques [[id number]]   QUEtiapine (SEROQUEL) 200 MG tablet    Sig: Take 1 tablet (200 mg total) by mouth at bedtime.    Dispense:  90 tablet    Refill:  1    Order Specific Question:   Supervising Provider    Answer:   Carlyle Basques [phone]  triamcinolone ointment (KENALOG) 0.5 %    Sig: Apply 1 Application topically 2 (two) times daily.    Dispense:  30 g    Refill:  3    Order Specific Question:   Supervising Provider    Answer:   Judyann Munson [[id number]]     Follow-up: Return in about 2 months (around 05/17/2022), or if symptoms worsen or fail to improve.   Marcos Eke, MSN, FNP-C Nurse Practitioner [facility] Main number: [phone]

## 2022-03-18 NOTE — Assessment & Plan Note (Signed)
[name]'s abdominal rash appears improved with triamcinolone cream. Continue for now. May need referral to dermatology if does not improve.

## 2022-03-18 NOTE — Assessment & Plan Note (Signed)
[name] has had improvements with addition of Anoro daily and albuterol.  I am concerned about the amount of albuterol he is using and potential for underlying COPD and with poorly controlled respiratory disease.  Refill Anoro.  Continue current dose of albuterol as needed with new referral placed to pulmonology for further evaluation and treatment.

## 2022-03-18 NOTE — Assessment & Plan Note (Signed)
Discussed importance of safe sexual practice and condom use. Condoms and STD testing offered.  Declines vaccinations.  

## 2022-03-18 NOTE — Assessment & Plan Note (Signed)
[name] continues to have well-controlled virus with good adherence and tolerance to [employer].  Reviewed previous lab work and discussed plan of care.  Check blood work today.  Continue current dose of Biktarvy.  Plan for follow-up in 2 months or sooner if needed with lab work on the same day.

## 2022-03-18 NOTE — Assessment & Plan Note (Signed)
[name]'s mood has remained stable with current dose of quetiapine and fluoxetine.  No suicidal ideations and or signs of psychosis.  Has gained some weight which is likely related to his psychiatric as well as HIV medications.  Continue current dose of quetiapine and fluoxetine.

## 2022-03-18 NOTE — Assessment & Plan Note (Signed)
Reflux is well-controlled with current dose of omeprazole.  Reminded of lifestyle management and food related triggers.  Continue current dose of omeprazole.

## 2022-03-19 LAB — COMPLETE METABOLIC PANEL WITH GFR
AG Ratio: 1.4 (calc) (ref 1.0–2.5)
ALT: 34 U/L (ref 9–46)
AST: 28 U/L (ref 10–35)
Albumin: 4.5 g/dL (ref 3.6–5.1)
Alkaline phosphatase (APISO): 57 U/L (ref 35–144)
BUN: 15 mg/dL (ref 7–25)
CO2: 23 mmol/L (ref 20–32)
Calcium: 9.2 mg/dL (ref 8.6–10.3)
Chloride: 107 mmol/L (ref 98–110)
Creat: 1.14 mg/dL (ref 0.70–1.30)
Globulin: 3.2 g/dL (calc) (ref 1.9–3.7)
Glucose, Bld: 88 mg/dL (ref 65–99)
Potassium: 4.1 mmol/L (ref 3.5–5.3)
Sodium: 138 mmol/L (ref 135–146)
Total Bilirubin: 0.5 mg/dL (ref 0.2–1.2)
Total Protein: 7.7 g/dL (ref 6.1–8.1)
eGFR: 76 mL/min/{1.73_m2} (ref >=60)

## 2022-03-19 LAB — T-HELPER CELL (CD4) - (RCID CLINIC ONLY)
CD4 % Helper T Cell: 17 % — ABNORMAL LOW (ref 33–65)
CD4 T Cell Abs: 341 /uL — ABNORMAL LOW (ref 400–1790)

## 2022-03-19 LAB — HIV-1 RNA QUANT-NO REFLEX-BLD
HIV 1 RNA Quant: NOT DETECTED Copies/mL
HIV-1 RNA Quant, Log: NOT DETECTED Log cps/mL

## 2022-04-03 NOTE — Progress Notes (Incomplete)
CC: ***  HPI:   [name] is a 57 y.o. male with a past medical history of HIV, PE 2015, hypotension, asthma, anxiety, and depression who presents for routine follow-up. He was last seen at [facility] in 10-2021.    Reactive airway disease Patient recently presented to the ED on 8-4 for productive cough, shortness of breath, and wheezing. Has a history of recurrent pneumonia.  He was discharged with albuterol, azithromycin, and prednisone.  He has been feeling much better since discharge and has no current complaints of shortness of breath, wheezing, and cough.  He quit smoking tobacco about 15 to 20 years ago and currently smokes about 1 to 2 cigarettes/month or less.  He does currently smoke about 1-2 joints of marijuana per day.  He has an upcoming visit with pulmonology on 9-11. -Continue to take albuterol as needed and follow-up with pulmonologist on September 11 - NEVER HAPPENED . Marland Kitchen [name] has had improvements with addition of Anoro (Umeclidinium bromide / Vilanterol LAMA-LABA) daily and albuterol SABA.  I am concerned about the amount of albuterol he is using and potential for underlying COPD and with poorly controlled respiratory disease.  Refill Anoro.  Continue current dose of albuterol as needed with new referral placed to pulmonology for further evaluation and treatment.         Relevant Medications    umeclidinium-vilanterol (ANORO ELLIPTA) 62.5-25 MCG/ACT AEPB    albuterol (VENTOLIN HFA) 108 (90 Base) MCG/ACT inhaler    Other Relevant Orders    Ambulatory referral to Pulmonology   xxx     Generalized anxiety disorder with panic attacks Patient reports that his mood is good, balanced, and he is able to sleep well.  He currently takes quetiapine and fluoxetine. -Continue fluoxetine 40 mg daily and quetiapine 200 mg daily    [name] mood has remained stable with current dose of quetiapine and fluoxetine.  No suicidal ideations and or signs of psychosis.  Has gained some  weight which is likely related to his psychiatric as well as HIV medications.  Continue current dose of quetiapine and fluoxetine.         Relevant Medications    FLUoxetine (PROZAC) 40 MG capsule   Xx     HIV (human immunodeficiency virus infection) (HCC) Patient has a long history of HIV dating back to his teenage years.  Currently followed by infectious disease here at [facility].  He was previously on Biktarvy and valacyclovir, but recently stopped valacyclovir and now only takes Biktarvy. -[employer] and regular visits with [facility] Saw ID on 03-18-2022: [name] continues to have well-controlled virus with good adherence and tolerance to [employer].  Reviewed previous lab work and discussed plan of care.  Check blood work today.  Continue current dose of Biktarvy.  Plan for follow-up in 2 months or sooner if needed with lab work on the same day.         Relevant Medications    bictegravir-emtricitabine-tenofovir AF (BIKTARVY) 50-200-25 MG TABS tablet    Other Relevant Orders    COMPLETE METABOLIC PANEL WITH GFR    HIV-1 RNA quant-no reflex-bld    T-helper cell (CD4)- ([facility] clinic only)   Xxxx     Shoulder pain, right Patient reports over a year of right shoulder pain that has recently worked worsened over the last month or so.  He denies experiencing any injuries, trauma, or falls at that time.  He describes the pain as a very deep intense  aching sensation that worsens with certain positions and it makes it difficult for him to sleep.  He says that it improves with modest activity, but then worsens afterward.  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  Pain, he takes about 10 ibuprofen 200 mg/day and he has also found gabapentin to be helpful.  Has not tried any heat pads, ice, or injections. On exam, there was significant tenderness to palpation over the right acromioclavicular joint.  Range of  motion in the right shoulder was normal, except internal rotation with adduction which was limited by pain. Strength in grip, elbow flexion and extension, and shoulder abduction strength were 5 out of 5 bilaterally.  Sensation to light touch were intact in both upper extremities. Spurling test negative. Empty can test was positive on the right negative on the left.  Differential includes subacromial impingement, rotator cuff tendinitis, and glenohumeral arthritis. -Prescribe diclofenac gel naproxen 500 mg twice per day for several weeks while discontinuing ibuprofen -Order x-ray of right shoulder rule out arthritis and return to clinic in 2 to 3 weeks for possible injection - NEVER COMPLETED  Continues to have neuropathic pain that is adequately managed with current dose of gabapentin. Continue current dose of gabapentin.          Relevant Medications    gabapentin (NEURONTIN) 600 MG tablet   Xxxx     Past Medical History:  Diagnosis Date   Anxiety    Arthritis    Asthma    Depression    History of Clostridium difficile colitis 04/22/2017   History of pulmonary embolus (PE) 03/17/2013   Overview:  post hip surgery, on blood thinners at the time   HIV (human immunodeficiency virus infection) ([place])    Hypokalemia    Hypotension 01/25/2015   Immune deficiency disorder ([place])    Infectious folliculitis 19/50/9326   MRSA (methicillin resistant staph aureus) culture positive    Sepsis ([place])    Streptococcal pneumonia ([place])      Review of Systems:    Reports *** Denies *** (subjective fever?, pain anywhere?, bowel changes?)   Physical Exam:  There were no vitals filed for this visit.  General:   awake and alert, sitting comfortably in chair, cooperative, not in acute distress Skin:   warm and dry, intact without any obvious lesions or scars, no rashes or lesions  Head:   normocephalic and atraumatic, oral mucosa moist with good dentition, no lymphadenopathy Eyes:   extraocular  movements intact, conjunctivae pink, pupils round and reactive to light, no periorbital swelling or scleral icterus Ears:   pinnae normal, no discharge or external lesions  Nose:   symmetrical and without mucosal inflammation, no external lesions or discharge Lungs:   normal respiratory effort, breathing unlabored, symmetrical chest rise, no crackles or wheezing Cardiac:   regular rate and rhythm, normal S1 and S2, capillary refill 2-3 seconds, dorsalis pedis pulses intact bilaterally, no pitting edema Abdomen:   soft and non-distended, normoactive bowel sounds present in all four quadrants, no guarding or palpable masses Musculoskeletal:   full range of motion in joints, motor strength 5 /5 in all four extremities, no obvious deformities or joint tenderness Neurologic:   oriented to person-place-time, moving all extremities, sensation to light touch intact, no facial droop Psychiatric:   euthymic mood with congruent affect, intelligible speech    Assessment & Plan:   No problem-specific Assessment &  Plan notes found for this encounter.     See Encounters Tab for problem based charting.  Patient {GC/GE:[id number]::"discussed with","seen with"} Dr. {NAMES:[id number]::"Guilloud","Hoffman","Mullen","Narendra","Williams","Vincent"}

## 2022-04-07 ENCOUNTER — Encounter: Payer: Medicare Other | Admitting: Student

## 2022-04-07 ENCOUNTER — Ambulatory Visit: Payer: Medicare Other

## 2022-04-07 ENCOUNTER — Other Ambulatory Visit: Payer: Self-pay

## 2022-07-21 ENCOUNTER — Other Ambulatory Visit: Payer: Self-pay | Admitting: Family

## 2022-07-21 DIAGNOSIS — K2100 Gastro-esophageal reflux disease with esophagitis, without bleeding: Secondary | ICD-10-CM

## 2022-07-21 NOTE — Telephone Encounter (Signed)
Do you want to continue? Patient no showed appt to establish with pcp

## 2022-07-28 ENCOUNTER — Telehealth: Payer: Self-pay

## 2022-07-28 DIAGNOSIS — J45909 Unspecified asthma, uncomplicated: Secondary | ICD-10-CM

## 2022-07-28 DIAGNOSIS — R200 Anesthesia of skin: Secondary | ICD-10-CM

## 2022-07-28 DIAGNOSIS — B20 Human immunodeficiency virus [HIV] disease: Secondary | ICD-10-CM

## 2022-07-28 MED ORDER — ALBUTEROL SULFATE HFA 108 (90 BASE) MCG/ACT IN AERS
2.0000 | INHALATION_SPRAY | Freq: Four times a day (QID) | RESPIRATORY_TRACT | 0 refills | Status: DC | PRN
Start: 2022-07-28 — End: 2022-08-04

## 2022-07-28 MED ORDER — BIKTARVY 50-200-25 MG PO TABS
1.0000 | ORAL_TABLET | Freq: Every day | ORAL | 0 refills | Status: DC
Start: 2022-07-28 — End: 2022-08-04

## 2022-07-28 MED ORDER — ANORO ELLIPTA 62.5-25 MCG/ACT IN AEPB
1.0000 | INHALATION_SPRAY | Freq: Every day | RESPIRATORY_TRACT | 0 refills | Status: DC
Start: 2022-07-28 — End: 2022-08-04

## 2022-07-28 MED ORDER — GABAPENTIN 600 MG PO TABS
1200.0000 mg | ORAL_TABLET | Freq: Three times a day (TID) | ORAL | 0 refills | Status: DC
Start: 2022-07-28 — End: 2022-08-04

## 2022-07-28 NOTE — Telephone Encounter (Signed)
Patient called with SOB stating he needs refills on his inhalers, gabapentin, and Biktarvy. States that he does not want to go to ED. Is requesting refills be sent today for all his medication.  Does not have a PCP and states he uses  [place], FNP is his PCP.  Will forward message to provider to advise on refills.  Number updated in chart. Juanita Laster, RMA

## 2022-07-28 NOTE — Telephone Encounter (Signed)
Called patient back regarding refills. Is scheduled for follow up on Monday 5/20. Understands if SOB does not improve or gets worse to go to ED. Verbalized understanding. Juanita Laster, RMA

## 2022-07-28 NOTE — Addendum Note (Signed)
Addended by: Juanita Laster on: 07/28/2022 01:51 PM   Modules accepted: Orders

## 2022-08-04 ENCOUNTER — Encounter: Payer: Self-pay | Admitting: Family

## 2022-08-04 ENCOUNTER — Other Ambulatory Visit: Payer: Self-pay

## 2022-08-04 ENCOUNTER — Ambulatory Visit (INDEPENDENT_AMBULATORY_CARE_PROVIDER_SITE_OTHER): Payer: Medicare Other | Admitting: Family

## 2022-08-04 VITALS — BP 134/91 | HR 69 | Temp 98.1°F | Ht 74.0 in | Wt 255.0 lb

## 2022-08-04 DIAGNOSIS — Z0000 Encounter for general adult medical examination without abnormal findings: Secondary | ICD-10-CM | POA: Diagnosis not present

## 2022-08-04 DIAGNOSIS — J45909 Unspecified asthma, uncomplicated: Secondary | ICD-10-CM | POA: Diagnosis not present

## 2022-08-04 DIAGNOSIS — Z9189 Other specified personal risk factors, not elsewhere classified: Secondary | ICD-10-CM

## 2022-08-04 DIAGNOSIS — F332 Major depressive disorder, recurrent severe without psychotic features: Secondary | ICD-10-CM

## 2022-08-04 DIAGNOSIS — R200 Anesthesia of skin: Secondary | ICD-10-CM | POA: Diagnosis not present

## 2022-08-04 DIAGNOSIS — B20 Human immunodeficiency virus [HIV] disease: Secondary | ICD-10-CM | POA: Diagnosis not present

## 2022-08-04 MED ORDER — BIKTARVY 50-200-25 MG PO TABS
1.0000 | ORAL_TABLET | Freq: Every day | ORAL | 5 refills | Status: DC
Start: 2022-08-04 — End: 2022-11-25

## 2022-08-04 MED ORDER — GABAPENTIN 600 MG PO TABS
1200.0000 mg | ORAL_TABLET | Freq: Three times a day (TID) | ORAL | 0 refills | Status: DC
Start: 2022-08-04 — End: 2022-11-25

## 2022-08-04 MED ORDER — QUETIAPINE FUMARATE 200 MG PO TABS: 200.0000 mg | ORAL_TABLET | Freq: Every day | ORAL | 1 refills | Status: DC

## 2022-08-04 MED ORDER — ANORO ELLIPTA 62.5-25 MCG/ACT IN AEPB
1.0000 | INHALATION_SPRAY | Freq: Every day | RESPIRATORY_TRACT | 5 refills | Status: DC
Start: 2022-08-04 — End: 2023-02-10

## 2022-08-04 MED ORDER — ALBUTEROL SULFATE HFA 108 (90 BASE) MCG/ACT IN AERS
2.0000 | INHALATION_SPRAY | Freq: Four times a day (QID) | RESPIRATORY_TRACT | 3 refills | Status: DC | PRN
Start: 2022-08-04 — End: 2022-11-25

## 2022-08-04 NOTE — Patient Instructions (Addendum)
Nice to see you. ? ?We will check your lab work today. ? ?Continue to take your medication daily as prescribed. ? ?Refills have been sent to the pharmacy. ? ?Please call [facility] ([facility]) to schedule/follow up on your dental care at [phone] ? ?Plan for follow up in 4 months or sooner if needed with lab work on the same day. ? ?Have a great day and stay safe! ? ?

## 2022-08-04 NOTE — Assessment & Plan Note (Signed)
Dayshawn continues to have well-controlled virus with good adherence and tolerance to [employer].  Reviewed previous lab work and discussed plan of care including U=U.  Check lab work.  Continue current dose of Biktarvy.  Plan for follow-up in 4 months or sooner if needed with lab work on the same day.

## 2022-08-04 NOTE — Assessment & Plan Note (Signed)
Discussed importance of safe sexual practice and condom use. Condoms and STD testing offered.  Vaccinations up-to-date. Referral placed for colonoscopy for colon cancer screening. Dental referral placed to see [facility]

## 2022-08-04 NOTE — Progress Notes (Signed)
Brief Narrative   Patient ID: [name], male    DOB: 1966-01-31, 57 y.o.   MRN: [id number]  [name] is a 57 y/o caucasian male with HIV disease diagnosed in 1995 with risk factor of sexual contact. Initial viral load and CD4 count are not available. Genotype from 03/07/13 with no significant medication resistant mutations.No history of opportunistic infection. Previous ART experience with nelfinavir, combivir, truvada, tenofovir, raltegravir, atazanavir, ritonivir, Symtuza and Tivicay.    Subjective:    Chief Complaint  Patient presents with   Follow-up   HIV Positive/AIDS   Depression    HPI:  [name] is a 57 y.o. male with HIV disease last seen on 03/18/2022 with well-controlled virus and good adherence and tolerance to [employer].  Viral load was undetectable with CD4 count of 341.  Kidney function, liver function, electrolytes within normal ranges.  Restarted on Anoro in addition to as needed albuterol for asthma/COPD.  Here today for routine follow-up.  [name] has been doing well since his last office visit and has since quit smoking marijuana and tobacco.  Continues to take [facility] as prescribed with no adverse side effects or problems obtaining medication.  Feeling very well with no new concerns/complaints.  Able to buy a motorcycle recently.  Condoms and STD testing offered.  Healthcare maintenance due includes colonoscopy.  Denies fevers, chills, night sweats, headaches, changes in vision, neck pain/stiffness, nausea, diarrhea, vomiting, lesions or rashes.   Allergies  Allergen Reactions   Hydrocodone Other (See Comments)    "makes me overly sleepy"   Sulfa Antibiotics Rash   Tramadol Nausea Only      Outpatient Medications Prior to Visit  Medication Sig Dispense Refill   diclofenac Sodium (VOLTAREN) 1 % GEL Apply 2 g topically 4 (four) times daily. 100 g 2   FLUoxetine (PROZAC) 40 MG capsule TAKE 1 CAPSULE (40 MG TOTAL) BY MOUTH DAILY. 90  capsule 1   ketoconazole (NIZORAL) 2 % cream APPLY 1 APPLICATION. TOPICALLY DAILY. ON RASH ON BOTTOM AND ABDOMEN 15 g 0   naproxen (NAPROSYN) 500 MG tablet TAKE 1 TABLET BY MOUTH TWICE A DAY WITH FOOD 60 tablet 0   omeprazole (PRILOSEC) 20 MG capsule TAKE 1 CAPSULE BY MOUTH EVERY DAY 90 capsule 1   sildenafil (VIAGRA) 50 MG tablet TAKE 1/2 (ONE-HALF) TABLET BY MOUTH ONCE DAILY AS NEEDED 10 tablet 0   triamcinolone ointment (KENALOG) 0.5 % Apply 1 Application topically 2 (two) times daily. 30 g 3   valACYclovir (VALTREX) 500 MG tablet Take 1 tablet (500 mg total) by mouth daily. 30 tablet 11   albuterol (VENTOLIN HFA) 108 (90 Base) MCG/ACT inhaler Inhale 2 puffs into the lungs every 6 (six) hours as needed for wheezing or shortness of breath. 54 g 0   bictegravir-emtricitabine-tenofovir AF (BIKTARVY) 50-200-25 MG TABS tablet Take 1 tablet by mouth daily. 30 tablet 0   gabapentin (NEURONTIN) 600 MG tablet Take 2 tablets (1,200 mg total) by mouth 3 (three) times daily. 180 tablet 0   QUEtiapine (SEROQUEL) 200 MG tablet Take 1 tablet (200 mg total) by mouth at bedtime. 90 tablet 1   umeclidinium-vilanterol (ANORO ELLIPTA) 62.5-25 MCG/ACT AEPB Inhale 1 puff into the lungs daily. 60 each 0   No facility-administered medications prior to visit.     Past Medical History:  Diagnosis Date   Anxiety    Arthritis    Asthma    Depression    History of Clostridium difficile colitis 04/22/2017  History of pulmonary embolus (PE) 03/17/2013   Overview:  post hip surgery, on blood thinners at the time   HIV (human immunodeficiency virus infection) (HCC)    Hypokalemia    Hypotension 01/25/2015   Immune deficiency disorder (HCC)    Infectious folliculitis 01/30/2017   MRSA (methicillin resistant staph aureus) culture positive    Sepsis (HCC)    Streptococcal pneumonia [facility])      Past Surgical History:  Procedure Laterality Date   hip arthroplasty Left 2016   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,  I&D with replacement in 2017   TOTAL HIP ARTHROPLASTY Right 02/09/2018   Procedure: TOTAL HIP ARTHROPLASTY ANTERIOR APPROACH;  Surgeon: Durene Romans, MD;  Location: [facility];  Service: Orthopedics;  Laterality: Right;  70 mins      Review of Systems  Constitutional:  Negative for appetite change, chills, fatigue, fever and unexpected weight change.  Eyes:  Negative for visual disturbance.  Respiratory:  Negative for cough, chest tightness, shortness of breath and wheezing.   Cardiovascular:  Negative for chest pain and leg swelling.  Gastrointestinal:  Negative for abdominal pain, constipation, diarrhea, nausea and vomiting.  Genitourinary:  Negative for dysuria, flank pain, frequency, genital sores, hematuria and urgency.  Skin:  Negative for rash.  Allergic/Immunologic: Negative for immunocompromised state.  Neurological:  Negative for dizziness and headaches.      Objective:    BP (!) 134/91   Pulse 69   Temp 98.1 F (36.7 C) (Temporal)   Ht 6\' 2"  (1.88 m)   Wt 255 lb (115.7 kg)   SpO2 96%   BMI 32.74 kg/m  Nursing note and vital signs reviewed.  Physical Exam Constitutional:      General: He is not in acute distress.    Appearance: He is well-developed.  Eyes:     Conjunctiva/sclera: Conjunctivae normal.  Cardiovascular:     Rate and Rhythm: Normal rate and regular rhythm.     Heart sounds: Normal heart sounds. No murmur heard.    No friction rub. No gallop.  Pulmonary:     Effort: Pulmonary effort is normal. No respiratory distress.     Breath sounds: Normal breath sounds. No wheezing or rales.  Chest:     Chest wall: No tenderness.  Abdominal:     General: Bowel sounds are normal.     Palpations: Abdomen is soft.     Tenderness: There is no abdominal tenderness.  Musculoskeletal:     Cervical back: Neck supple.  Lymphadenopathy:     Cervical: No cervical adenopathy.  Skin:    General: Skin is warm and dry.     Findings: No rash.  Neurological:      Mental Status: He is alert and oriented to person, place, and time.  Psychiatric:        Behavior: Behavior normal.        Thought Content: Thought content normal.        Judgment: Judgment normal.         08/04/2022   11:13 AM 03/18/2022   11:03 AM 11/04/2021    3:52 PM 10/28/2021   10:45 AM 02/26/2021   11:07 AM  Depression screen PHQ 2/9  Decreased Interest 0 0  0 0  Down, Depressed, Hopeless 0 0 0 0 1  PHQ - 2 Score 0 0 0 0 1  Altered sleeping   0    Tired, decreased energy   1    Change in appetite   0  Feeling bad or failure about yourself    0    Trouble concentrating   0    Moving slowly or fidgety/restless   0    Suicidal thoughts   0    PHQ-9 Score   1         Assessment & Plan:    Patient Active Problem List   Diagnosis Date Noted   HIV (human immunodeficiency virus infection) (HCC) 01/24/2015    Priority: High   Rash and nonspecific skin eruption 03/18/2022   Reactive airway disease 11/04/2021   Shoulder pain, right 11/04/2021   Alcohol abuse 09/26/2021   Erysipelas of left lower extremity 10/24/2020   Mood swings 10/24/2020   Erectile dysfunction 04/11/2019   Generalized anxiety disorder with panic attacks 10/25/2018   Anxiety about health 10/25/2018   Healthcare maintenance 07/06/2018   Numbness of right lower extremity 07/06/2018   Heartburn 07/06/2018   S/P right THA, AA 02/09/2018   S/P hip replacement 02/09/2018   Essential hypertension    Lung abscess (HCC) 06/21/2017   Lung nodule, multiple 06/14/2017   Asthma 04/22/2017   Complication of implanted device 07/24/2015   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  Has noted worsening after being out of medication.  Quit tobacco use and marijuana which should help significantly.  Continue Anoro and albuterol.      Relevant Medications   umeclidinium-vilanterol (ANORO ELLIPTA) 62.5-25 MCG/ACT AEPB   albuterol (VENTOLIN HFA) 108 (90 Base) MCG/ACT inhaler     Other   HIV (human immunodeficiency virus infection) (HCC) - Primary    Ewel continues to have well-controlled virus with good adherence and tolerance to [employer].  Reviewed previous lab work and discussed plan of care including U=U.  Check lab work.  Continue current dose of Biktarvy.  Plan for follow-up in 4 months or sooner if needed with lab work on the same day.      Relevant Medications   bictegravir-emtricitabine-tenofovir AF (BIKTARVY) 50-200-25 MG TABS tablet   Other Relevant Orders   COMPLETE METABOLIC PANEL WITH GFR   HIV-1 RNA quant-no reflex-bld   T-helper cells (CD4) count (not at [facility])   AMB REFERRAL TO [facility]ICE [facility]ENC[facility]   Severe episode of recurrent major depressive disorder, without psychotic features (HCC)    Emrys's mood has been stable and well-controlled with current dose of quetiapine and fluoxetine.  No suicidal ideations or adverse side effects.  Sleeping well.  Attributes significant improvements to medication as well as cessation of tobacco and marijuana.  Continue current dose of quetiapine and fluoxetine.      Healthcare maintenance    Discussed importance of safe sexual practice and condom use. Condoms and STD testing offered.  Vaccinations up-to-date. Referral placed for colonoscopy for colon cancer screening. Dental referral placed to see [facility]      Numbness of right lower extremity    Neuropathic pain adequately controlled with current dose of gabapentin and no adverse side effects.  Continue current dose of gabapentin.      Relevant Medications   gabapentin  (  NEURONTIN) 600 MG tablet   Other Visit Diagnoses     At risk for colon cancer       Relevant Orders   Ambulatory referral to Gastroenterology        I am having Theophilus Kinds. Pong maintain his valACYclovir, sildenafil, diclofenac Sodium, ketoconazole, triamcinolone ointment, naproxen, FLUoxetine, omeprazole, Anoro Ellipta, QUEtiapine, gabapentin, Biktarvy, and albuterol.   Meds ordered this encounter  Medications   umeclidinium-vilanterol (ANORO ELLIPTA) 62.5-25 MCG/ACT AEPB    Sig: Inhale 1 puff into the lungs daily.    Dispense:  60 each    Refill:  5    Order Specific Question:   Supervising Provider    Answer:   Drue Second, CYNTHIA [[id number]]   QUEtiapine (SEROQUEL) 200 MG tablet    Sig: Take 1 tablet (200 mg total) by mouth at bedtime.    Dispense:  90 tablet    Refill:  1    Order Specific Question:   Supervising Provider    Answer:   Drue Second, CYNTHIA [[id number]]   gabapentin (NEURONTIN) 600 MG tablet    Sig: Take 2 tablets (1,200 mg total) by mouth 3 (three) times daily.    Dispense:  180 tablet    Refill:  0    Order Specific Question:   Supervising Provider    Answer:   Drue Second, CYNTHIA [[id number]]   bictegravir-emtricitabine-tenofovir AF (BIKTARVY) 50-200-25 MG TABS tablet    Sig: Take 1 tablet by mouth daily.    Dispense:  30 tablet    Refill:  5    Order Specific Question:   Supervising Provider    Answer:   Drue Second, CYNTHIA [[id number]]   albuterol (VENTOLIN HFA) 108 (90 Base) MCG/ACT inhaler    Sig: Inhale 2 puffs into the lungs every 6 (six) hours as needed for wheezing or shortness of breath.    Dispense:  54 g    Refill:  3    Order Specific Question:   Supervising Provider    Answer:   Judyann Munson [[id number]]     Follow-up: Return in about 4 months (around 12/05/2022), or if symptoms worsen or fail to improve.   Marcos Eke, MSN, FNP-C Nurse Practitioner [facility] Main number: [phone]

## 2022-08-04 NOTE — Assessment & Plan Note (Signed)
[name]'s breathing is well-controlled when using Anoro in addition to albuterol as needed.  Has noted worsening after being out of medication.  Quit tobacco use and marijuana which should help significantly.  Continue Anoro and albuterol.

## 2022-08-04 NOTE — Assessment & Plan Note (Signed)
Rondo's mood has been stable and well-controlled with current dose of quetiapine and fluoxetine.  No suicidal ideations or adverse side effects.  Sleeping well.  Attributes significant improvements to medication as well as cessation of tobacco and marijuana.  Continue current dose of quetiapine and fluoxetine.

## 2022-08-04 NOTE — Assessment & Plan Note (Signed)
Neuropathic pain adequately controlled with current dose of gabapentin and no adverse side effects.  Continue current dose of gabapentin.

## 2022-08-05 LAB — COMPLETE METABOLIC PANEL WITH GFR
ALT: 124 U/L — ABNORMAL HIGH (ref 9–46)
Albumin: 4.5 g/dL (ref 3.6–5.1)
Chloride: 105 mmol/L (ref 98–110)
Potassium: 3.9 mmol/L (ref 3.5–5.3)
Sodium: 137 mmol/L (ref 135–146)
Total Protein: 7.6 g/dL (ref 6.1–8.1)
eGFR: 78 mL/min/{1.73_m2} (ref >=60)

## 2022-08-07 LAB — COMPLETE METABOLIC PANEL WITH GFR
AG Ratio: 1.5 (calc) (ref 1.0–2.5)
AST: 160 U/L — ABNORMAL HIGH (ref 10–35)
Alkaline phosphatase (APISO): 64 U/L (ref 35–144)
BUN: 21 mg/dL (ref 7–25)
CO2: 23 mmol/L (ref 20–32)
Calcium: 9.3 mg/dL (ref 8.6–10.3)
Creat: 1.12 mg/dL (ref 0.70–1.30)
Globulin: 3.1 g/dL (calc) (ref 1.9–3.7)
Glucose, Bld: 66 mg/dL (ref 65–99)
Total Bilirubin: 0.4 mg/dL (ref 0.2–1.2)

## 2022-08-07 LAB — HIV-1 RNA QUANT-NO REFLEX-BLD
HIV 1 RNA Quant: 29 Copies/mL — ABNORMAL HIGH
HIV-1 RNA Quant, Log: 1.46 Log cps/mL — ABNORMAL HIGH

## 2022-08-07 LAB — T-HELPER CELLS (CD4) COUNT (NOT AT ARMC)
Absolute CD4: 312 cells/uL — ABNORMAL LOW (ref 490–1740)
CD4 T Helper %: 23 % — ABNORMAL LOW (ref 30–61)
Total lymphocyte count: 1372 cells/uL (ref 850–3900)

## 2022-08-08 ENCOUNTER — Other Ambulatory Visit: Payer: Self-pay | Admitting: Family

## 2022-08-08 DIAGNOSIS — R748 Abnormal levels of other serum enzymes: Secondary | ICD-10-CM

## 2022-08-28 NOTE — Progress Notes (Signed)
The 10-year ASCVD risk score (Arnett DK, et al., 2019) is: 4.1%   Values used to calculate the score:     Age: 57 years     Sex: Male     Is Non-Hispanic African American: No     Diabetic: No     Tobacco smoker: No     Systolic Blood Pressure: 134 mmHg     Is BP treated: No     HDL Cholesterol: 68 mg/dL     Total Cholesterol: 181 mg/dL  Sandie Ano, RN

## 2022-11-05 ENCOUNTER — Other Ambulatory Visit: Payer: Self-pay | Admitting: Family

## 2022-11-05 NOTE — Telephone Encounter (Signed)
Refill ok? 

## 2022-11-05 NOTE — Telephone Encounter (Signed)
Refill

## 2022-11-25 ENCOUNTER — Encounter: Payer: Self-pay | Admitting: Family

## 2022-11-25 ENCOUNTER — Other Ambulatory Visit: Payer: Self-pay

## 2022-11-25 ENCOUNTER — Other Ambulatory Visit (HOSPITAL_COMMUNITY): Payer: Self-pay

## 2022-11-25 ENCOUNTER — Ambulatory Visit (INDEPENDENT_AMBULATORY_CARE_PROVIDER_SITE_OTHER): Payer: Medicare Other | Admitting: Family

## 2022-11-25 VITALS — BP 131/84 | HR 80 | Temp 97.8°F | Ht 74.0 in | Wt 238.8 lb

## 2022-11-25 DIAGNOSIS — I878 Other specified disorders of veins: Secondary | ICD-10-CM | POA: Insufficient documentation

## 2022-11-25 DIAGNOSIS — R12 Heartburn: Secondary | ICD-10-CM

## 2022-11-25 DIAGNOSIS — B20 Human immunodeficiency virus [HIV] disease: Secondary | ICD-10-CM | POA: Diagnosis not present

## 2022-11-25 DIAGNOSIS — Z79899 Other long term (current) drug therapy: Secondary | ICD-10-CM

## 2022-11-25 DIAGNOSIS — E785 Hyperlipidemia, unspecified: Secondary | ICD-10-CM | POA: Diagnosis not present

## 2022-11-25 DIAGNOSIS — K2100 Gastro-esophageal reflux disease with esophagitis, without bleeding: Secondary | ICD-10-CM

## 2022-11-25 DIAGNOSIS — Z0000 Encounter for general adult medical examination without abnormal findings: Secondary | ICD-10-CM

## 2022-11-25 DIAGNOSIS — J449 Chronic obstructive pulmonary disease, unspecified: Secondary | ICD-10-CM

## 2022-11-25 DIAGNOSIS — Z9189 Other specified personal risk factors, not elsewhere classified: Secondary | ICD-10-CM

## 2022-11-25 DIAGNOSIS — F332 Major depressive disorder, recurrent severe without psychotic features: Secondary | ICD-10-CM

## 2022-11-25 DIAGNOSIS — R200 Anesthesia of skin: Secondary | ICD-10-CM

## 2022-11-25 DIAGNOSIS — F1010 Alcohol abuse, uncomplicated: Secondary | ICD-10-CM

## 2022-11-25 MED ORDER — QUETIAPINE FUMARATE 200 MG PO TABS: 200.0000 mg | ORAL_TABLET | Freq: Every day | ORAL | 1 refills | Status: DC

## 2022-11-25 MED ORDER — DICLOFENAC SODIUM 1 % EX GEL: 2.0000 g | Freq: Four times a day (QID) | CUTANEOUS | 2 refills | Status: AC

## 2022-11-25 MED ORDER — FLUOXETINE HCL 40 MG PO CAPS: 40.0000 mg | ORAL_CAPSULE | Freq: Every day | ORAL | 1 refills | Status: DC

## 2022-11-25 MED ORDER — OMEPRAZOLE 20 MG PO CPDR
DELAYED_RELEASE_CAPSULE | ORAL | 1 refills | Status: DC
Start: 2022-11-25 — End: 2023-03-26

## 2022-11-25 MED ORDER — KETOCONAZOLE 2 % EX CREA: TOPICAL_CREAM | Freq: Every day | CUTANEOUS | 0 refills | Status: DC

## 2022-11-25 MED ORDER — ALBUTEROL SULFATE HFA 108 (90 BASE) MCG/ACT IN AERS
2.0000 | INHALATION_SPRAY | Freq: Four times a day (QID) | RESPIRATORY_TRACT | 3 refills | Status: DC | PRN
Start: 2022-11-25 — End: 2023-02-10

## 2022-11-25 MED ORDER — TRELEGY ELLIPTA 100-62.5-25 MCG/ACT IN AEPB: 1.0000 | INHALATION_SPRAY | Freq: Every day | RESPIRATORY_TRACT | 3 refills | Status: DC

## 2022-11-25 MED ORDER — BIKTARVY 50-200-25 MG PO TABS
1.0000 | ORAL_TABLET | Freq: Every day | ORAL | 5 refills | Status: DC
Start: 2022-11-25 — End: 2023-02-10

## 2022-11-25 MED ORDER — GABAPENTIN 600 MG PO TABS
1200.0000 mg | ORAL_TABLET | Freq: Three times a day (TID) | ORAL | 0 refills | Status: DC
Start: 2022-11-25 — End: 2023-02-10

## 2022-11-25 NOTE — Progress Notes (Signed)
Brief Narrative   Patient ID: [name], male    DOB: 01-14-1966, 57 y.o.   MRN: [id number]  [name] is a 57 y/o caucasian male with HIV disease diagnosed in 1995 with risk factor of sexual contact. Initial viral load and CD4 count are not available. Genotype from 03/07/13 with no significant medication resistant mutations.No history of opportunistic infection. Previous ART experience with nelfinavir, combivir, truvada, tenofovir, raltegravir, atazanavir, ritonivir, Symtuza and Tivicay.    Subjective:    Chief Complaint  Patient presents with   Follow-up    B20    HPI:  [name] is a 57 y.o. male with HIV disease last seen on 08/04/22 with well controlled virus and good adherence and tolerance to Biktarvy. Viral load was undetectable and CD4 count 312. Kidney function and electrolytes within normal ranges. Liver function testing elevated with AST 160 and ALT 124 suspected to be related to heavy alcohol usage. Here today for follow up.  [name] has been doing okay since his last office visit and has been overall feeling well. Taking medications as prescribed with no adverse side effects or problems or obtaining medications from the pharmacy. Continues to have venous stasis wounds on his left lower extremity without evidence of infection and has not had follow up with Vascular Surgery. Taking his Anoro which helps with his breathing but also has to use albuterol on average 2-3 times per day. Drinking a significant amount of [employer] and beer. Mood has been stable with no outbursts. Pain on occasion in his hips. Condoms and STD testing offered. Healthcare maintenance due includes colonoscopy for colon cancer screening and influenza vaccination.   Denies fevers, chills, night sweats, headaches, changes in vision, neck pain/stiffness, nausea, diarrhea, vomiting, lesions or rashes.  Lab Results  Component Value Date   CD4TCELL 23 (L) 08/04/2022   CD4TABS 341 (L)  03/18/2022   Lab Results  Component Value Date   HIV1RNAQUANT 29 (H) 08/04/2022     Allergies  Allergen Reactions   Hydrocodone Other (See Comments)    "makes me overly sleepy"   Sulfa Antibiotics Rash   Tramadol Nausea Only      Outpatient Medications Prior to Visit  Medication Sig Dispense Refill   triamcinolone ointment (KENALOG) 0.5 % Apply 1 Application topically 2 (two) times daily. 30 g 3   umeclidinium-vilanterol (ANORO ELLIPTA) 62.5-25 MCG/ACT AEPB Inhale 1 puff into the lungs daily. 60 each 5   valACYclovir (VALTREX) 500 MG tablet Take 1 tablet (500 mg total) by mouth daily. 30 tablet 11   albuterol (VENTOLIN HFA) 108 (90 Base) MCG/ACT inhaler Inhale 2 puffs into the lungs every 6 (six) hours as needed for wheezing or shortness of breath. 54 g 3   bictegravir-emtricitabine-tenofovir AF (BIKTARVY) 50-200-25 MG TABS tablet Take 1 tablet by mouth daily. 30 tablet 5   diclofenac Sodium (VOLTAREN) 1 % GEL Apply 2 g topically 4 (four) times daily. 100 g 2   FLUoxetine (PROZAC) 40 MG capsule TAKE 1 CAPSULE (40 MG TOTAL) BY MOUTH DAILY. 90 capsule 1   gabapentin (NEURONTIN) 600 MG tablet Take 2 tablets (1,200 mg total) by mouth 3 (three) times daily. 180 tablet 0   ketoconazole (NIZORAL) 2 % cream APPLY 1 APPLICATION. TOPICALLY DAILY. ON RASH ON BOTTOM AND ABDOMEN 15 g 0   omeprazole (PRILOSEC) 20 MG capsule TAKE 1 CAPSULE BY MOUTH EVERY DAY 90 capsule 1   QUEtiapine (SEROQUEL) 200 MG tablet TAKE 1 TABLET BY MOUTH  AT BEDTIME. 90 tablet 1   naproxen (NAPROSYN) 500 MG tablet TAKE 1 TABLET BY MOUTH TWICE A DAY WITH FOOD (Patient not taking: Reported on 11/25/2022) 60 tablet 0   sildenafil (VIAGRA) 50 MG tablet TAKE 1/2 (ONE-HALF) TABLET BY MOUTH ONCE DAILY AS NEEDED (Patient not taking: Reported on 11/25/2022) 10 tablet 0   No facility-administered medications prior to visit.     Past Medical History:  Diagnosis Date   Anxiety    Arthritis    Asthma    Depression    History  of Clostridium difficile colitis 04/22/2017   History of pulmonary embolus (PE) 03/17/2013   Overview:  post hip surgery, on blood thinners at the time   HIV (human immunodeficiency virus infection) (HCC)    Hypokalemia    Hypotension 01/25/2015   Immune deficiency disorder (HCC)    Infectious folliculitis 01/30/2017   MRSA (methicillin resistant staph aureus) culture positive    Sepsis (HCC)    Streptococcal pneumonia [facility])      Past Surgical History:  Procedure Laterality Date   hip arthroplasty Left 2016   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, I&D with replacement in 2017   TOTAL HIP ARTHROPLASTY Right 02/09/2018   Procedure: TOTAL HIP ARTHROPLASTY ANTERIOR APPROACH;  Surgeon: Durene Romans, MD;  Location: [facility];  Service: Orthopedics;  Laterality: Right;  70 mins      Review of Systems  Constitutional:  Negative for appetite change, chills, fatigue, fever and unexpected weight change.  Eyes:  Negative for visual disturbance.  Respiratory:  Positive for shortness of breath and wheezing. Negative for cough and chest tightness.   Cardiovascular:  Negative for chest pain and leg swelling.  Gastrointestinal:  Negative for abdominal pain, constipation, diarrhea, nausea and vomiting.  Genitourinary:  Negative for dysuria, flank pain, frequency, genital sores, hematuria and urgency.  Skin:  Negative for rash.  Allergic/Immunologic: Negative for immunocompromised state.  Neurological:  Negative for dizziness and headaches.      Objective:    BP 131/84   Pulse 80   Temp 97.8 F (36.6 C) (Temporal)   Ht 6\' 2"  (1.88 m)   Wt 238 lb 12.8 oz (108.3 kg)   SpO2 98%   BMI 30.66 kg/m  Nursing note and vital signs reviewed.  Physical Exam Constitutional:      General: He is not in acute distress.    Appearance: He is well-developed.  Cardiovascular:     Rate and Rhythm: Normal rate and regular rhythm.     Heart sounds: Normal heart sounds.  Pulmonary:     Effort: Pulmonary  effort is normal.     Breath sounds: Normal breath sounds.  Skin:    General: Skin is warm and dry.  Neurological:     Mental Status: He is alert and oriented to person, place, and time.  Psychiatric:        Mood and Affect: Mood normal.         08/04/2022   11:13 AM 03/18/2022   11:03 AM 11/04/2021    3:52 PM 10/28/2021   10:45 AM 02/26/2021   11:07 AM  Depression screen PHQ 2/9  Decreased Interest 0 0  0 0  Down, Depressed, Hopeless 0 0 0 0 1  PHQ - 2 Score 0 0 0 0 1  Altered sleeping   0    Tired, decreased energy   1    Change in appetite   0    Feeling bad or failure about yourself  0    Trouble concentrating   0    Moving slowly or fidgety/restless   0    Suicidal thoughts   0    PHQ-9 Score   1         Assessment & Plan:    Patient Active Problem List   Diagnosis Date Noted   HIV (human immunodeficiency virus infection) (HCC) 01/24/2015    Priority: High   Venous stasis 11/25/2022   Rash and nonspecific skin eruption 03/18/2022   Reactive airway disease 11/04/2021   Shoulder pain, right 11/04/2021   Alcohol abuse 09/26/2021   Erysipelas of left lower extremity 10/24/2020   Mood swings 10/24/2020   Erectile dysfunction 04/11/2019   Generalized anxiety disorder with panic attacks 10/25/2018   Anxiety about health 10/25/2018   Healthcare maintenance 07/06/2018   Numbness of right lower extremity 07/06/2018   Heartburn 07/06/2018   S/P right THA, AA 02/09/2018   S/P hip replacement 02/09/2018   Essential hypertension    Lung abscess (HCC) 06/21/2017   Lung nodule, multiple 06/14/2017   COPD (chronic obstructive pulmonary disease) (HCC) 04/22/2017   Complication of implanted device 07/24/2015   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 Will try Trelogy and continue with current dose of albuterol as needed with goal of decreasing albuterol usage. Would benefit from referral to Pulmonology for optimization of medication.       Relevant Medications   albuterol (VENTOLIN HFA) 108 (90 Base) MCG/ACT inhaler   Fluticasone-Umeclidin-Vilant (TRELEGY ELLIPTA) 100-62.5-25 MCG/ACT AEPB     Digestive   GERD (gastroesophageal reflux disease)    Well controlled with current dose of omeprazole and no adverse side effects. Continue current dose of omeprazole.       Relevant Medications   omeprazole (PRILOSEC) 20 MG capsule     Other   HIV (human immunodeficiency virus infection) (HCC)    Eeshan continues to have well controlled virus with good adherence and tolerance to [employer].  Reviewed lab work and discussed plan of care, U equals U, and family planning. Check lab work. Continue current dose of Biktarvy. Plan for follow up in  3 months or sooner if needed with lab work on the same day.       Relevant Medications   bictegravir-emtricitabine-tenofovir AF (BIKTARVY) 50-200-25 MG TABS tablet   ketoconazole (NIZORAL) 2 % cream   Other Relevant Orders   COMPLETE METABOLIC PANEL WITH GFR   HIV-1 RNA quant-no reflex-bld   T-helper cell (CD4)- ([facility] clinic only)   Severe episode of recurrent major depressive disorder, without psychotic features (HCC)    Mood has remained stable with current regimen and no adverse side effects. No suicidal ideations or signs of psychosis. Question underlying bipolar disorder versus contributions of alcohol usage. Continue current dose of seroquel and fluoxetine.      Relevant Medications   FLUoxetine (PROZAC) 40 MG capsule   Healthcare maintenance - Primary     Discussed importance of safe sexual practice and condom use. Condoms and STD testing offered.  Declines vaccinations Referral placed for colonoscopy for colon cancer screening.  Due for routine dental care and will schedule appointment.       Numbness of right lower extremity   Relevant Medications   gabapentin (NEURONTIN) 600 MG tablet   Heartburn   Alcohol abuse    [name] continues to consume a significant amount of alcohol and appears to lack insight into amount. Working on cutting back.  Counseled regarding resources and recommendations for no more than 2 drinks per day.       Venous stasis    Venous statis complicated with wounds on his left lower extremity. No evidence of infection. Circulation appears to be worsening. Will refer to Vascular Surgery for further evaluation.       Relevant Orders   Ambulatory referral to Vascular Surgery   Other Visit Diagnoses     Pharmacologic therapy       At risk for colon cancer       Relevant Orders   Ambulatory referral to Gastroenterology        I have changed [name]'s QUEtiapine and ketoconazole. I am also having him start on Trelegy Ellipta. Additionally, I am having him maintain his valACYclovir, sildenafil, triamcinolone ointment, naproxen, Anoro Ellipta, albuterol, Biktarvy, diclofenac Sodium, FLUoxetine, gabapentin, and omeprazole.   Meds ordered this encounter  Medications   albuterol (VENTOLIN HFA) 108 (90 Base) MCG/ACT inhaler    Sig: Inhale 2 puffs into the lungs every 6 (six) hours as needed for wheezing or shortness of breath.    Dispense:  54 g    Refill:  3    Order Specific Question:   Supervising Provider    Answer:   Drue Second, CYNTHIA [[id number]]   bictegravir-emtricitabine-tenofovir AF (BIKTARVY) 50-200-25 MG TABS tablet    Sig: Take 1 tablet by mouth daily.    Dispense:  30 tablet    Refill:  5    Order Specific Question:   Supervising Provider    Answer:   Drue Second, CYNTHIA [[id number]]    diclofenac Sodium (VOLTAREN) 1 % GEL    Sig: Apply 2 g topically 4 (four) times daily.    Dispense:  100 g    Refill:  2    Order Specific Question:   Supervising Provider    Answer:   Drue Second, CYNTHIA [[id number]]   FLUoxetine (PROZAC) 40 MG capsule    Sig: Take 1 capsule (40 mg total) by mouth daily.    Dispense:  90 capsule    Refill:  1    Order Specific Question:   Supervising Provider    Answer:   Drue Second, CYNTHIA [[id number]]   gabapentin (NEURONTIN) 600 MG tablet    Sig: Take 2 tablets (1,200 mg total) by mouth 3 (three) times daily.    Dispense:  180 tablet    Refill:  0    Order Specific Question:   Supervising Provider    Answer:   Drue Second, CYNTHIA [[id number]]   omeprazole (PRILOSEC) 20 MG capsule    Sig: TAKE 1 CAPSULE BY MOUTH EVERY DAY    Dispense:  90 capsule    Refill:  1    Order Specific Question:   Supervising Provider    Answer:   Drue Second, CYNTHIA [[id number]]   QUEtiapine (SEROQUEL) 200 MG tablet    Sig: Take 1 tablet (200 mg total) by mouth at bedtime.    Dispense:  90 tablet    Refill:  1    Order Specific Question:   Supervising Provider  Answer:   Judyann Munson [[id number]]   Fluticasone-Umeclidin-Vilant (TRELEGY ELLIPTA) 100-62.5-25 MCG/ACT AEPB    Sig: Inhale 1 Inhalation into the lungs daily.    Dispense:  28 each    Refill:  3    Order Specific Question:   Supervising Provider    Answer:   Drue Second, CYNTHIA [[id number]]   ketoconazole (NIZORAL) 2 % cream    Sig: Apply topically daily. On rash on bottom and abdomen    Dispense:  15 g    Refill:  0    Order Specific Question:   Supervising Provider    Answer:   Judyann Munson [[id number]]     Follow-up: Return in about 3 months (around 02/24/2023). or sooner if needed.    Marcos Eke, MSN, FNP-C Nurse Practitioner [facility] Main number: [phone]

## 2022-11-25 NOTE — Patient Instructions (Addendum)
Nice to see you.  We will check your lab work today.  Continue to take your medication daily as prescribed.  Refills have been sent to the pharmacy.  Plan for follow up in 3 months or sooner if needed with lab work on the same day.  Have a great day and stay safe!  

## 2022-11-25 NOTE — Assessment & Plan Note (Signed)
Mr. Finnigan continues to consume a significant amount of alcohol and appears to lack insight into amount. Working on cutting back.  Counseled regarding resources and recommendations for no more than 2 drinks per day.

## 2022-11-25 NOTE — Assessment & Plan Note (Signed)
Well controlled with current dose of omeprazole and no adverse side effects. Continue current dose of omeprazole.

## 2022-11-25 NOTE — Assessment & Plan Note (Signed)
[name] continues to have well controlled virus with good adherence and tolerance to [employer].  Reviewed lab work and discussed plan of care, U equals U, and family planning. Check lab work. Continue current dose of Biktarvy. Plan for follow up in  3 months or sooner if needed with lab work on the same day.

## 2022-11-25 NOTE — Assessment & Plan Note (Signed)
Appears to be stable with current dose of Anoro but continued need for albuterol. Will try Trelogy and continue with current dose of albuterol as needed with goal of decreasing albuterol usage. Would benefit from referral to Pulmonology for optimization of medication.

## 2022-11-25 NOTE — Assessment & Plan Note (Signed)
Discussed importance of safe sexual practice and condom use. Condoms and STD testing offered.  Declines vaccinations Referral placed for colonoscopy for colon cancer screening.  Due for routine dental care and will schedule appointment.

## 2022-11-25 NOTE — Assessment & Plan Note (Signed)
Mood has remained stable with current regimen and no adverse side effects. No suicidal ideations or signs of psychosis. Question underlying bipolar disorder versus contributions of alcohol usage. Continue current dose of seroquel and fluoxetine.

## 2022-11-25 NOTE — Assessment & Plan Note (Signed)
Venous statis complicated with wounds on his left lower extremity. No evidence of infection. Circulation appears to be worsening. Will refer to Vascular Surgery for further evaluation.

## 2022-11-27 LAB — COMPLETE METABOLIC PANEL WITH GFR
AG Ratio: 1.5 (calc) (ref 1.0–2.5)
ALT: 56 U/L — ABNORMAL HIGH (ref 9–46)
AST: 51 U/L — ABNORMAL HIGH (ref 10–35)
Albumin: 4.5 g/dL (ref 3.6–5.1)
Alkaline phosphatase (APISO): 76 U/L (ref 35–144)
BUN: 15 mg/dL (ref 7–25)
CO2: 23 mmol/L (ref 20–32)
Calcium: 9.3 mg/dL (ref 8.6–10.3)
Chloride: 106 mmol/L (ref 98–110)
Creat: 1.21 mg/dL (ref 0.70–1.30)
Globulin: 3.1 g/dL (ref 1.9–3.7)
Glucose, Bld: 76 mg/dL (ref 65–99)
Potassium: 4.5 mmol/L (ref 3.5–5.3)
Sodium: 137 mmol/L (ref 135–146)
Total Bilirubin: 0.5 mg/dL (ref 0.2–1.2)
Total Protein: 7.6 g/dL (ref 6.1–8.1)
eGFR: 71 mL/min/{1.73_m2} (ref >=60)

## 2022-11-27 LAB — HIV-1 RNA QUANT-NO REFLEX-BLD
HIV 1 RNA Quant: <20 {copies}/mL — ABNORMAL HIGH
HIV-1 RNA Quant, Log: <1.3 {Log_copies}/mL — ABNORMAL HIGH

## 2022-11-27 LAB — T-HELPER CELL (CD4) - (RCID CLINIC ONLY)
CD4 % Helper T Cell: 23 % — ABNORMAL LOW (ref 33–65)
CD4 T Cell Abs: 315 /uL — ABNORMAL LOW (ref 400–1790)

## 2022-11-29 ENCOUNTER — Other Ambulatory Visit: Payer: Self-pay | Admitting: Family

## 2022-11-29 DIAGNOSIS — R7989 Other specified abnormal findings of blood chemistry: Secondary | ICD-10-CM

## 2022-12-02 ENCOUNTER — Ambulatory Visit
Admission: RE | Admit: 2022-12-02 | Discharge: 2022-12-02 | Disposition: A | Discharge status: Home / Self Care | Payer: Medicare Other | Source: Ambulatory Visit | Attending: Family | Admitting: Family

## 2022-12-02 DIAGNOSIS — R7989 Other specified abnormal findings of blood chemistry: Secondary | ICD-10-CM

## 2022-12-02 DIAGNOSIS — R945 Abnormal results of liver function studies: Secondary | ICD-10-CM | POA: Diagnosis not present

## 2022-12-02 DIAGNOSIS — N281 Cyst of kidney, acquired: Secondary | ICD-10-CM | POA: Diagnosis not present

## 2022-12-17 ENCOUNTER — Other Ambulatory Visit: Payer: Self-pay | Admitting: *Deleted

## 2022-12-17 DIAGNOSIS — I878 Other specified disorders of veins: Secondary | ICD-10-CM

## 2022-12-26 ENCOUNTER — Ambulatory Visit (HOSPITAL_COMMUNITY)
Admission: RE | Admit: 2022-12-26 | Discharge: 2022-12-26 | Disposition: A | Discharge status: Home / Self Care | Payer: Medicare Other | Source: Ambulatory Visit | Attending: Vascular Surgery | Admitting: Vascular Surgery

## 2022-12-26 ENCOUNTER — Ambulatory Visit (INDEPENDENT_AMBULATORY_CARE_PROVIDER_SITE_OTHER): Payer: Medicare Other | Admitting: Physician Assistant

## 2022-12-26 VITALS — HR 81 | Temp 98.3°F | Wt 242.1 lb

## 2022-12-26 DIAGNOSIS — L97909 Non-pressure chronic ulcer of unspecified part of unspecified lower leg with unspecified severity: Secondary | ICD-10-CM

## 2022-12-26 DIAGNOSIS — I83009 Varicose veins of unspecified lower extremity with ulcer of unspecified site: Secondary | ICD-10-CM | POA: Diagnosis not present

## 2022-12-26 DIAGNOSIS — I878 Other specified disorders of veins: Secondary | ICD-10-CM | POA: Insufficient documentation

## 2022-12-26 DIAGNOSIS — I872 Venous insufficiency (chronic) (peripheral): Secondary | ICD-10-CM

## 2022-12-26 NOTE — Progress Notes (Signed)
Office Note     CC:  follow up Requesting Provider:  Veryl Speak, FNP  HPI: [name] is a 57 y.o. (25-Apr-1965) male who presents for evaluation of left lower extremity edema and wounds.  He has a history of pulmonary embolism however is unsure if he had a DVT.  In the past he has been in [employer] of the left lower extremity.  He has recurrent venous ulcerations.  He wraps his leg with ace wraps which offers relief however has not worn compression socks.  He denies any vascular intervention in the past.  He makes an effort to elevate his legs during the day.  He was addicted to crack cocaine for over 20 years however has been sober over the last 9 months.   Past Medical History:  Diagnosis Date   Anxiety    Arthritis    Asthma    Depression    History of Clostridium difficile colitis 04/22/2017   History of pulmonary embolus (PE) 03/17/2013   Overview:  post hip surgery, on blood thinners at the time   HIV (human immunodeficiency virus infection) (HCC)    Hypokalemia    Hypotension 01/25/2015   Immune deficiency disorder (HCC)    Infectious folliculitis 01/30/2017   MRSA (methicillin resistant staph aureus) culture positive    Sepsis (HCC)    Streptococcal pneumonia [facility])     Past Surgical History:  Procedure Laterality Date   hip arthroplasty Left 2016   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, I&D with replacement in 2017   TOTAL HIP ARTHROPLASTY Right 02/09/2018   Procedure: TOTAL HIP ARTHROPLASTY ANTERIOR APPROACH;  Surgeon: Durene Romans, MD;  Location: [facility];  Service: Orthopedics;  Laterality: Right;  70 mins    Social History   Socioeconomic History   Marital status: Divorced    Spouse name: Not on file   Number of children: 5   Years of education: 11   Highest education level: Not on file  Occupational History    Comment: na  Tobacco Use   Smoking status: Former    Types: Cigarettes   Smokeless tobacco: Current    Types: Chew  Vaping Use    Vaping status: Never Used  Substance and Sexual Activity   Alcohol use: Yes    Comment: daily to occasionally   Drug use: Yes    Types: Marijuana    Comment: socially   Sexual activity: Not on file    Comment: declined condoms  Other Topics Concern   Not on file  Social History Narrative   Lives alone   Social Determinants of Health   Financial Resource Strain: Not on file  Food Insecurity: No Food Insecurity (11/05/2021)   Hunger Vital Sign    Worried About Running Out of Food in the Last Year: Never true    Ran Out of Food in the Last Year: Never true  Transportation Needs: No Transportation Needs (11/05/2021)   PRAPARE - Administrator, Civil Service (Medical): No    Lack of Transportation (Non-Medical): No  Physical Activity: Not on file  Stress: Not on file  Social Connections: Not on file  Intimate Partner Violence: Not on file    Family History  Problem Relation Age of Onset   Cancer Mother    CAD Brother     Current Outpatient Medications  Medication Sig Dispense Refill   albuterol (VENTOLIN HFA) 108 (90 Base) MCG/ACT inhaler Inhale 2 puffs into the lungs every 6 (six)  hours as needed for wheezing or shortness of breath. 54 g 3   bictegravir-emtricitabine-tenofovir AF (BIKTARVY) 50-200-25 MG TABS tablet Take 1 tablet by mouth daily. 30 tablet 5   diclofenac Sodium (VOLTAREN) 1 % GEL Apply 2 g topically 4 (four) times daily. 100 g 2   FLUoxetine (PROZAC) 40 MG capsule Take 1 capsule (40 mg total) by mouth daily. 90 capsule 1   Fluticasone-Umeclidin-Vilant (TRELEGY ELLIPTA) 100-62.5-25 MCG/ACT AEPB Inhale 1 Inhalation into the lungs daily. 28 each 3   gabapentin (NEURONTIN) 600 MG tablet Take 2 tablets (1,200 mg total) by mouth 3 (three) times daily. 180 tablet 0   ketoconazole (NIZORAL) 2 % cream Apply topically daily. On rash on bottom and abdomen 15 g 0   naproxen (NAPROSYN) 500 MG tablet TAKE 1 TABLET BY MOUTH TWICE A DAY WITH FOOD 60 tablet 0    omeprazole (PRILOSEC) 20 MG capsule TAKE 1 CAPSULE BY MOUTH EVERY DAY 90 capsule 1   QUEtiapine (SEROQUEL) 200 MG tablet Take 1 tablet (200 mg total) by mouth at bedtime. 90 tablet 1   sildenafil (VIAGRA) 50 MG tablet TAKE 1/2 (ONE-HALF) TABLET BY MOUTH ONCE DAILY AS NEEDED 10 tablet 0   triamcinolone ointment (KENALOG) 0.5 % Apply 1 Application topically 2 (two) times daily. 30 g 3   umeclidinium-vilanterol (ANORO ELLIPTA) 62.5-25 MCG/ACT AEPB Inhale 1 puff into the lungs daily. 60 each 5   valACYclovir (VALTREX) 500 MG tablet Take 1 tablet (500 mg total) by mouth daily. 30 tablet 11   No current facility-administered medications for this visit.    Allergies  Allergen Reactions   Hydrocodone Other (See Comments)    "makes me overly sleepy"   Sulfa Antibiotics Rash   Tramadol Nausea Only     REVIEW OF SYSTEMS:   [X]  denotes positive finding, [ ]  denotes negative finding Cardiac  Comments:  Chest pain or chest pressure:    Shortness of breath upon exertion:    Short of breath when lying flat:    Irregular heart rhythm:        Vascular    Pain in calf, thigh, or hip brought on by ambulation:    Pain in feet at night that wakes you up from your sleep:     Blood clot in your veins:    Leg swelling:         Pulmonary    Oxygen at home:    Productive cough:     Wheezing:         Neurologic    Sudden weakness in arms or legs:     Sudden numbness in arms or legs:     Sudden onset of difficulty speaking or slurred speech:    Temporary loss of vision in one eye:     Problems with dizziness:         Gastrointestinal    Blood in stool:     Vomited blood:         Genitourinary    Burning when urinating:     Blood in urine:        Psychiatric    Major depression:         Hematologic    Bleeding problems:    Problems with blood clotting too easily:        Skin    Rashes or ulcers:        Constitutional    Fever or chills:      PHYSICAL EXAMINATION:  Vitals:  12/26/22 1021  Pulse: 81  Temp: 98.3 F (36.8 C)  TempSrc: Temporal  SpO2: 95%  Weight: 242 lb 1.6 oz (109.8 kg)    General:  WDWN in NAD; vital signs documented above Gait: Not observed HENT: WNL, normocephalic Pulmonary: normal non-labored breathing , without Rales, rhonchi,  wheezing Cardiac: regular HR Abdomen: soft, NT, no masses Skin: without rashes Vascular Exam/Pulses: palpable DP pulses Extremities: without ischemic changes, hyperpigmentation changes and indurated skin of the left lower extremity with cracking skin and open wounds pictured below; varicosities along the path of the greater saphenous vein in the left leg Musculoskeletal: no muscle wasting or atrophy  Neurologic: A&O X 3 Psychiatric:  The pt has Normal affect.    Non-Invasive Vascular Imaging:   Left lower extremity venous reflux study negative for DVT Completely incompetent deep system Incompetent and dilated GSV throughout the thigh   ASSESSMENT/PLAN:: 57 y.o. male here for evaluation of left lower extremity edema and venous ulcerations  [name] is a 57 year old male with chronic venous insufficiency resulting in venous ulcerations.  Left lower extremity venous reflux study negative for DVT.  He has an incompetent deep venous system as well as an incompetent and dilated GSV.  We will place him in an Unna boot today and try to have home health perform weekly Unna boot changes until wounds have healed.  He was also measured for and prescribed thigh-high 20 to 30 mmHg compression socks to be worn on a daily basis.  He will also focus on periodic leg elevation as well as avoiding prolonged sitting and standing.  He will follow-up in office in about 3 months to see Dr. Randie Haas to see if he would be a candidate for saphenous vein ablation therapy.   Joseph Rutter, PA-C [facility] [phone]  Clinic MD:   [name]

## 2023-01-02 DIAGNOSIS — F32A Depression, unspecified: Secondary | ICD-10-CM | POA: Diagnosis not present

## 2023-01-02 DIAGNOSIS — M1990 Unspecified osteoarthritis, unspecified site: Secondary | ICD-10-CM | POA: Diagnosis not present

## 2023-01-02 DIAGNOSIS — Z7951 Long term (current) use of inhaled steroids: Secondary | ICD-10-CM | POA: Diagnosis not present

## 2023-01-02 DIAGNOSIS — Z8701 Personal history of pneumonia (recurrent): Secondary | ICD-10-CM | POA: Diagnosis not present

## 2023-01-02 DIAGNOSIS — F419 Anxiety disorder, unspecified: Secondary | ICD-10-CM | POA: Diagnosis not present

## 2023-01-02 DIAGNOSIS — Z96643 Presence of artificial hip joint, bilateral: Secondary | ICD-10-CM | POA: Diagnosis not present

## 2023-01-02 DIAGNOSIS — Z87891 Personal history of nicotine dependence: Secondary | ICD-10-CM | POA: Diagnosis not present

## 2023-01-02 DIAGNOSIS — Z86711 Personal history of pulmonary embolism: Secondary | ICD-10-CM | POA: Diagnosis not present

## 2023-01-02 DIAGNOSIS — J45909 Unspecified asthma, uncomplicated: Secondary | ICD-10-CM | POA: Diagnosis not present

## 2023-01-02 DIAGNOSIS — L988 Other specified disorders of the skin and subcutaneous tissue: Secondary | ICD-10-CM | POA: Diagnosis not present

## 2023-01-02 DIAGNOSIS — I872 Venous insufficiency (chronic) (peripheral): Secondary | ICD-10-CM | POA: Diagnosis not present

## 2023-01-02 DIAGNOSIS — I959 Hypotension, unspecified: Secondary | ICD-10-CM | POA: Diagnosis not present

## 2023-01-02 DIAGNOSIS — B20 Human immunodeficiency virus [HIV] disease: Secondary | ICD-10-CM | POA: Diagnosis not present

## 2023-01-05 ENCOUNTER — Telehealth: Payer: Self-pay

## 2023-01-05 NOTE — Telephone Encounter (Signed)
John Giovanni with [facility] called stating that she did start of care on Friday and applied a new Unna boot. She states that the leg looks better, no wounds. She reapplied since the pt is waiting for compression stockings.  Reviewed pt's chart, returned call for clarification, two identifiers used. Informed her that this office does not order them. The pt is usually measured and offered to buy some from our stock. No measurements found in chart. Asked HH RN to measure pt and call back with measurements. His size can then be given to him to order through [facility]. Confirmed understanding.

## 2023-01-06 DIAGNOSIS — F419 Anxiety disorder, unspecified: Secondary | ICD-10-CM | POA: Diagnosis not present

## 2023-01-06 DIAGNOSIS — I872 Venous insufficiency (chronic) (peripheral): Secondary | ICD-10-CM | POA: Diagnosis not present

## 2023-01-06 DIAGNOSIS — B20 Human immunodeficiency virus [HIV] disease: Secondary | ICD-10-CM | POA: Diagnosis not present

## 2023-01-06 DIAGNOSIS — J45909 Unspecified asthma, uncomplicated: Secondary | ICD-10-CM | POA: Diagnosis not present

## 2023-01-06 DIAGNOSIS — M1990 Unspecified osteoarthritis, unspecified site: Secondary | ICD-10-CM | POA: Diagnosis not present

## 2023-01-06 DIAGNOSIS — L988 Other specified disorders of the skin and subcutaneous tissue: Secondary | ICD-10-CM | POA: Diagnosis not present

## 2023-01-07 NOTE — Telephone Encounter (Signed)
Stanton Kidney, RN with [facility] called with pt's L leg measurements.  Reviewed pt's chart, returned call for clarification, two identifiers used. Confirmed measurements, placed in chart, given information regarding [employer] to purchase. Confirmed understanding.

## 2023-01-13 DIAGNOSIS — M1990 Unspecified osteoarthritis, unspecified site: Secondary | ICD-10-CM | POA: Diagnosis not present

## 2023-01-13 DIAGNOSIS — F419 Anxiety disorder, unspecified: Secondary | ICD-10-CM | POA: Diagnosis not present

## 2023-01-13 DIAGNOSIS — L988 Other specified disorders of the skin and subcutaneous tissue: Secondary | ICD-10-CM | POA: Diagnosis not present

## 2023-01-13 DIAGNOSIS — I872 Venous insufficiency (chronic) (peripheral): Secondary | ICD-10-CM | POA: Diagnosis not present

## 2023-01-13 DIAGNOSIS — B20 Human immunodeficiency virus [HIV] disease: Secondary | ICD-10-CM | POA: Diagnosis not present

## 2023-01-13 DIAGNOSIS — J45909 Unspecified asthma, uncomplicated: Secondary | ICD-10-CM | POA: Diagnosis not present

## 2023-01-20 ENCOUNTER — Telehealth: Payer: Self-pay

## 2023-01-20 NOTE — Telephone Encounter (Signed)
Cancellation letter sent in My chart and in mail

## 2023-01-20 NOTE — Telephone Encounter (Signed)
Called patient back to try and clear up any misunderstanding the patient may have about the pre visit but was unable to reach him.  Left a message that we were trying to reach him about his upcoming colonoscopy to go over his prep instructions and for him to call back by 5pm today or we would have to cancel his colonoscopy.

## 2023-01-20 NOTE — Telephone Encounter (Signed)
Attempted to complete patients PV apt. Patient identified using two patient identifiers. Upon confirming address with patient line disconnects. Immediately called patient back in attempt to complete visit. Patient beings cursing and being argumentative saying " I hung up on him because he didn't know his D--- address I should be ashamed do I know how many people I had just offended and do I know how it feels to not be able to remember things" I attempted to explain to the patient that I did not hang up on him the phone disconnected which prompted me to call him back. Patient then hangs up the call. Another attempt then made to reach the patient again pt stating " I advise you not to call this D---- phone no more" again I attempted to explain I did not hang up on him and if it was ok with him we can complete his PV. Pt states I will check in with my other Drs I don't want to come and see you for a visit I will check in with people who are not cold hearted. Pt chart given to Augusto Gamble, RN to see if the patient will be willing to complete his PV if he talks to a different nurse.

## 2023-01-20 NOTE — Telephone Encounter (Signed)
Unable to reach patient.  No call back.  Pre visit and colonoscopy cancelled per Dr. Tomasa Rand.

## 2023-01-23 DIAGNOSIS — B20 Human immunodeficiency virus [HIV] disease: Secondary | ICD-10-CM | POA: Diagnosis not present

## 2023-01-23 DIAGNOSIS — I872 Venous insufficiency (chronic) (peripheral): Secondary | ICD-10-CM | POA: Diagnosis not present

## 2023-01-23 DIAGNOSIS — M1990 Unspecified osteoarthritis, unspecified site: Secondary | ICD-10-CM | POA: Diagnosis not present

## 2023-01-23 DIAGNOSIS — J45909 Unspecified asthma, uncomplicated: Secondary | ICD-10-CM | POA: Diagnosis not present

## 2023-01-23 DIAGNOSIS — F419 Anxiety disorder, unspecified: Secondary | ICD-10-CM | POA: Diagnosis not present

## 2023-01-23 DIAGNOSIS — L988 Other specified disorders of the skin and subcutaneous tissue: Secondary | ICD-10-CM | POA: Diagnosis not present

## 2023-01-30 DIAGNOSIS — M1990 Unspecified osteoarthritis, unspecified site: Secondary | ICD-10-CM | POA: Diagnosis not present

## 2023-01-30 DIAGNOSIS — F419 Anxiety disorder, unspecified: Secondary | ICD-10-CM | POA: Diagnosis not present

## 2023-01-30 DIAGNOSIS — B20 Human immunodeficiency virus [HIV] disease: Secondary | ICD-10-CM | POA: Diagnosis not present

## 2023-01-30 DIAGNOSIS — J45909 Unspecified asthma, uncomplicated: Secondary | ICD-10-CM | POA: Diagnosis not present

## 2023-01-30 DIAGNOSIS — I872 Venous insufficiency (chronic) (peripheral): Secondary | ICD-10-CM | POA: Diagnosis not present

## 2023-01-30 DIAGNOSIS — L988 Other specified disorders of the skin and subcutaneous tissue: Secondary | ICD-10-CM | POA: Diagnosis not present

## 2023-02-01 DIAGNOSIS — M1990 Unspecified osteoarthritis, unspecified site: Secondary | ICD-10-CM | POA: Diagnosis not present

## 2023-02-01 DIAGNOSIS — F419 Anxiety disorder, unspecified: Secondary | ICD-10-CM | POA: Diagnosis not present

## 2023-02-01 DIAGNOSIS — Z86711 Personal history of pulmonary embolism: Secondary | ICD-10-CM | POA: Diagnosis not present

## 2023-02-01 DIAGNOSIS — Z7951 Long term (current) use of inhaled steroids: Secondary | ICD-10-CM | POA: Diagnosis not present

## 2023-02-01 DIAGNOSIS — B20 Human immunodeficiency virus [HIV] disease: Secondary | ICD-10-CM | POA: Diagnosis not present

## 2023-02-01 DIAGNOSIS — I872 Venous insufficiency (chronic) (peripheral): Secondary | ICD-10-CM | POA: Diagnosis not present

## 2023-02-01 DIAGNOSIS — J45909 Unspecified asthma, uncomplicated: Secondary | ICD-10-CM | POA: Diagnosis not present

## 2023-02-01 DIAGNOSIS — F32A Depression, unspecified: Secondary | ICD-10-CM | POA: Diagnosis not present

## 2023-02-01 DIAGNOSIS — Z96643 Presence of artificial hip joint, bilateral: Secondary | ICD-10-CM | POA: Diagnosis not present

## 2023-02-01 DIAGNOSIS — Z8701 Personal history of pneumonia (recurrent): Secondary | ICD-10-CM | POA: Diagnosis not present

## 2023-02-01 DIAGNOSIS — I959 Hypotension, unspecified: Secondary | ICD-10-CM | POA: Diagnosis not present

## 2023-02-01 DIAGNOSIS — Z87891 Personal history of nicotine dependence: Secondary | ICD-10-CM | POA: Diagnosis not present

## 2023-02-01 DIAGNOSIS — L988 Other specified disorders of the skin and subcutaneous tissue: Secondary | ICD-10-CM | POA: Diagnosis not present

## 2023-02-05 ENCOUNTER — Encounter: Payer: Medicare Other | Admitting: Gastroenterology

## 2023-02-06 DIAGNOSIS — F419 Anxiety disorder, unspecified: Secondary | ICD-10-CM | POA: Diagnosis not present

## 2023-02-06 DIAGNOSIS — J45909 Unspecified asthma, uncomplicated: Secondary | ICD-10-CM | POA: Diagnosis not present

## 2023-02-06 DIAGNOSIS — L988 Other specified disorders of the skin and subcutaneous tissue: Secondary | ICD-10-CM | POA: Diagnosis not present

## 2023-02-06 DIAGNOSIS — M1990 Unspecified osteoarthritis, unspecified site: Secondary | ICD-10-CM | POA: Diagnosis not present

## 2023-02-06 DIAGNOSIS — I872 Venous insufficiency (chronic) (peripheral): Secondary | ICD-10-CM | POA: Diagnosis not present

## 2023-02-06 DIAGNOSIS — B20 Human immunodeficiency virus [HIV] disease: Secondary | ICD-10-CM | POA: Diagnosis not present

## 2023-02-10 ENCOUNTER — Other Ambulatory Visit: Payer: Self-pay

## 2023-02-10 ENCOUNTER — Ambulatory Visit (INDEPENDENT_AMBULATORY_CARE_PROVIDER_SITE_OTHER): Payer: Medicare Other | Admitting: Family

## 2023-02-10 ENCOUNTER — Encounter: Payer: Self-pay | Admitting: Family

## 2023-02-10 VITALS — BP 133/86 | HR 64 | Temp 97.5°F | Ht 73.0 in | Wt 241.0 lb

## 2023-02-10 DIAGNOSIS — Z23 Encounter for immunization: Secondary | ICD-10-CM | POA: Diagnosis not present

## 2023-02-10 DIAGNOSIS — J449 Chronic obstructive pulmonary disease, unspecified: Secondary | ICD-10-CM | POA: Diagnosis not present

## 2023-02-10 DIAGNOSIS — R200 Anesthesia of skin: Secondary | ICD-10-CM

## 2023-02-10 DIAGNOSIS — I872 Venous insufficiency (chronic) (peripheral): Secondary | ICD-10-CM | POA: Diagnosis not present

## 2023-02-10 DIAGNOSIS — Z21 Asymptomatic human immunodeficiency virus [HIV] infection status: Secondary | ICD-10-CM | POA: Diagnosis not present

## 2023-02-10 DIAGNOSIS — L988 Other specified disorders of the skin and subcutaneous tissue: Secondary | ICD-10-CM | POA: Diagnosis not present

## 2023-02-10 DIAGNOSIS — I878 Other specified disorders of veins: Secondary | ICD-10-CM

## 2023-02-10 DIAGNOSIS — Z0000 Encounter for general adult medical examination without abnormal findings: Secondary | ICD-10-CM

## 2023-02-10 DIAGNOSIS — B20 Human immunodeficiency virus [HIV] disease: Secondary | ICD-10-CM | POA: Diagnosis not present

## 2023-02-10 DIAGNOSIS — R4586 Emotional lability: Secondary | ICD-10-CM | POA: Diagnosis not present

## 2023-02-10 DIAGNOSIS — F419 Anxiety disorder, unspecified: Secondary | ICD-10-CM | POA: Diagnosis not present

## 2023-02-10 DIAGNOSIS — M1990 Unspecified osteoarthritis, unspecified site: Secondary | ICD-10-CM | POA: Diagnosis not present

## 2023-02-10 DIAGNOSIS — J45909 Unspecified asthma, uncomplicated: Secondary | ICD-10-CM | POA: Diagnosis not present

## 2023-02-10 MED ORDER — GABAPENTIN 600 MG PO TABS: 1200.0000 mg | ORAL_TABLET | Freq: Three times a day (TID) | ORAL | 0 refills | Status: DC

## 2023-02-10 MED ORDER — TRELEGY ELLIPTA 100-62.5-25 MCG/ACT IN AEPB: 1.0000 | INHALATION_SPRAY | Freq: Every day | RESPIRATORY_TRACT | 3 refills | Status: DC

## 2023-02-10 MED ORDER — QUETIAPINE FUMARATE 200 MG PO TABS: 200.0000 mg | ORAL_TABLET | Freq: Every day | ORAL | 1 refills | Status: AC

## 2023-02-10 MED ORDER — KETOCONAZOLE 2 % EX CREA: TOPICAL_CREAM | Freq: Every day | CUTANEOUS | 0 refills | Status: DC

## 2023-02-10 MED ORDER — FLUOXETINE HCL 40 MG PO CAPS: 40.0000 mg | ORAL_CAPSULE | Freq: Every day | ORAL | 1 refills | Status: DC

## 2023-02-10 MED ORDER — ALBUTEROL SULFATE HFA 108 (90 BASE) MCG/ACT IN AERS: 2.0000 | INHALATION_SPRAY | Freq: Four times a day (QID) | RESPIRATORY_TRACT | 3 refills | Status: AC | PRN

## 2023-02-10 MED ORDER — BIKTARVY 50-200-25 MG PO TABS: 1.0000 | ORAL_TABLET | Freq: Every day | ORAL | 5 refills | Status: AC

## 2023-02-10 NOTE — Assessment & Plan Note (Signed)
Seen by Vascular Surgery and wound is healing well with prescribed interventions and wound care. Continue management per Vascular Surgery.

## 2023-02-10 NOTE — Progress Notes (Signed)
Brief Narrative   Patient ID: [name], male    DOB: [birth year], 57 y.o.   MRN: [id number]  [name] is a 57 y/o caucasian male with HIV disease diagnosed in 1995 with risk factor of sexual contact. Initial viral load and CD4 count are not available. Genotype from 03/07/13 with no significant medication resistant mutations.No history of opportunistic infection. Previous ART experience with nelfinavir, combivir, truvada, tenofovir, raltegravir, atazanavir, ritonivir, Symtuza and Tivicay.    Subjective:    Chief Complaint  Patient presents with   Follow-up    B20    HPI:  [name] is a 57 y.o. male with HIV disease last seen on on 11/25/2022 with well-controlled virus and good adherence and tolerance to [employer].  Viral load was undetectable with CD4 count 315.  COPD medication was changed from Anoro to Trelegy.  Mood was stable and continued on fluoxetine and Seroquel.  Here today for routine follow-up.  [name] has been doing okay since his last office visit and continues to take [facility] as prescribed with no adverse side effects.  Has noted increased levels of chest pain and shortness of breath having to use his albuterol inhaler more since his previous office visit.  Has also been more "lazy".  Has missed a few doses of fluoxetine and Seroquel and has noticed his mood fluctuates when he misses medication.  Seen by vascular surgery with leg wound healing well.  Healthcare maintenance reviewed.  Condoms and STD testing offered.  Denies feelings of being down, depressed, or hopeless recently.   Denies fevers, chills, night sweats, headaches, changes in vision, neck pain/stiffness, nausea, diarrhea, vomiting, lesions or rashes.  Lab Results  Component Value Date   CD4TCELL 23 (L) 11/25/2022   CD4TABS 315 (L) 11/25/2022   Lab Results  Component Value Date   HIV1RNAQUANT <20 (H) 11/25/2022     Allergies  Allergen Reactions   Hydrocodone Other (See Comments)     "makes me overly sleepy"   Sulfa Antibiotics Rash   Tramadol Nausea Only      Outpatient Medications Prior to Visit  Medication Sig Dispense Refill   diclofenac Sodium (VOLTAREN) 1 % GEL Apply 2 g topically 4 (four) times daily. 100 g 2   naproxen (NAPROSYN) 500 MG tablet TAKE 1 TABLET BY MOUTH TWICE A DAY WITH FOOD 60 tablet 0   omeprazole (PRILOSEC) 20 MG capsule TAKE 1 CAPSULE BY MOUTH EVERY DAY 90 capsule 1   sildenafil (VIAGRA) 50 MG tablet TAKE 1/2 (ONE-HALF) TABLET BY MOUTH ONCE DAILY AS NEEDED 10 tablet 0   triamcinolone ointment (KENALOG) 0.5 % Apply 1 Application topically 2 (two) times daily. 30 g 3   valACYclovir (VALTREX) 500 MG tablet Take 1 tablet (500 mg total) by mouth daily. 30 tablet 11   albuterol (VENTOLIN HFA) 108 (90 Base) MCG/ACT inhaler Inhale 2 puffs into the lungs every 6 (six) hours as needed for wheezing or shortness of breath. 54 g 3   bictegravir-emtricitabine-tenofovir AF (BIKTARVY) 50-200-25 MG TABS tablet Take 1 tablet by mouth daily. 30 tablet 5   FLUoxetine (PROZAC) 40 MG capsule Take 1 capsule (40 mg total) by mouth daily. 90 capsule 1   Fluticasone-Umeclidin-Vilant (TRELEGY ELLIPTA) 100-62.5-25 MCG/ACT AEPB Inhale 1 Inhalation into the lungs daily. 28 each 3   gabapentin (NEURONTIN) 600 MG tablet Take 2 tablets (1,200 mg total) by mouth 3 (three) times daily. 180 tablet 0   ketoconazole (NIZORAL) 2 % cream Apply topically daily. On  rash on bottom and abdomen 15 g 0   QUEtiapine (SEROQUEL) 200 MG tablet Take 1 tablet (200 mg total) by mouth at bedtime. 90 tablet 1   umeclidinium-vilanterol (ANORO ELLIPTA) 62.5-25 MCG/ACT AEPB Inhale 1 puff into the lungs daily. 60 each 5   No facility-administered medications prior to visit.     Past Medical History:  Diagnosis Date   Anxiety    Arthritis    Asthma    Depression    History of Clostridium difficile colitis 04/22/2017   History of pulmonary embolus (PE) 03/17/2013   Overview:  post hip surgery,  on blood thinners at the time   HIV (human immunodeficiency virus infection) (HCC)    Hypokalemia    Hypotension 01/25/2015   Immune deficiency disorder (HCC)    Infectious folliculitis 01/30/2017   MRSA (methicillin resistant staph aureus) culture positive    Sepsis (HCC)    Streptococcal pneumonia [facility])      Past Surgical History:  Procedure Laterality Date   hip arthroplasty Left 2016   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, I&D with replacement in 2017   TOTAL HIP ARTHROPLASTY Right 02/09/2018   Procedure: TOTAL HIP ARTHROPLASTY ANTERIOR APPROACH;  Surgeon: Durene Romans, MD;  Location: [facility];  Service: Orthopedics;  Laterality: Right;  70 mins      Review of Systems  Constitutional:  Negative for appetite change, chills, fatigue, fever and unexpected weight change.  Eyes:  Negative for visual disturbance.  Respiratory:  Positive for shortness of breath. Negative for cough, chest tightness and wheezing.   Cardiovascular:  Negative for chest pain and leg swelling.  Gastrointestinal:  Negative for abdominal pain, constipation, diarrhea, nausea and vomiting.  Genitourinary:  Negative for dysuria, flank pain, frequency, genital sores, hematuria and urgency.  Skin:  Negative for rash.  Allergic/Immunologic: Negative for immunocompromised state.  Neurological:  Negative for dizziness and headaches.      Objective:    BP 133/86   Pulse 64   Temp (!) 97.5 F (36.4 C) (Temporal)   Ht 6\' 1"  (1.854 m)   Wt 241 lb (109.3 kg)   BMI 31.80 kg/m  Nursing note and vital signs reviewed.  Physical Exam Constitutional:      General: He is not in acute distress.    Appearance: He is well-developed.  Eyes:     Conjunctiva/sclera: Conjunctivae normal.  Cardiovascular:     Rate and Rhythm: Normal rate and regular rhythm.     Heart sounds: Normal heart sounds. No murmur heard.    No friction rub. No gallop.  Pulmonary:     Effort: Pulmonary effort is normal. No respiratory  distress.     Breath sounds: Normal breath sounds. No wheezing or rales.  Chest:     Chest wall: No tenderness.  Abdominal:     General: Bowel sounds are normal.     Palpations: Abdomen is soft.     Tenderness: There is no abdominal tenderness.  Musculoskeletal:     Cervical back: Neck supple.  Lymphadenopathy:     Cervical: No cervical adenopathy.  Skin:    General: Skin is warm and dry.     Findings: No rash.  Neurological:     Mental Status: He is alert and oriented to person, place, and time.  Psychiatric:        Behavior: Behavior normal.        Thought Content: Thought content normal.        Judgment: Judgment normal.  02/10/2023   10:25 AM 08/04/2022   11:13 AM 03/18/2022   11:03 AM 11/04/2021    3:52 PM 10/28/2021   10:45 AM  Depression screen PHQ 2/9  Decreased Interest 0 0 0  0  Down, Depressed, Hopeless 0 0 0 0 0  PHQ - 2 Score 0 0 0 0 0  Altered sleeping    0   Tired, decreased energy    1   Change in appetite    0   Feeling bad or failure about yourself     0   Trouble concentrating    0   Moving slowly or fidgety/restless    0   Suicidal thoughts    0   PHQ-9 Score    1        Assessment & Plan:    Patient Active Problem List   Diagnosis Date Noted   HIV (human immunodeficiency virus infection) (HCC) 01/24/2015    Priority: High   Venous stasis 11/25/2022   Rash and nonspecific skin eruption 03/18/2022   Reactive airway disease 11/04/2021   Shoulder pain, right 11/04/2021   Alcohol abuse 09/26/2021   Erysipelas of left lower extremity 10/24/2020   Mood swings 10/24/2020   Erectile dysfunction 04/11/2019   Generalized anxiety disorder with panic attacks 10/25/2018   Anxiety about health 10/25/2018   Healthcare maintenance 07/06/2018   Numbness of right lower extremity 07/06/2018   Heartburn 07/06/2018   S/P right THA, AA 02/09/2018   S/P hip replacement 02/09/2018   Essential hypertension    Lung abscess (HCC) 06/21/2017   Lung  nodule, multiple 06/14/2017   COPD (chronic obstructive pulmonary disease) (HCC) 04/22/2017   Complication of implanted device 07/24/2015   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 Recommend establishing with Pulmonology for further evaluation and management of COPD. Will continue current dose of Trelegy and as needed albuterol for now.       Relevant Medications   albuterol (VENTOLIN HFA) 108 (90 Base) MCG/ACT inhaler   Fluticasone-Umeclidin-Vilant (TRELEGY ELLIPTA) 100-62.5-25 MCG/ACT AEPB   Other Relevant Orders   Ambulatory referral to Pulmonology     Other   HIV (human immunodeficiency virus infection) (HCC) - Primary    [name] continues to have well controlled virus with good adherence and tolerance to [employer].  Reviewed lab work and discussed plan of care, U equals U, and family planning. Check lab work. Continue current dose of Biktarvy. Plan for follow up in  2 months or sooner if needed with lab work on the same day.       Relevant Medications   bictegravir-emtricitabine-tenofovir AF (BIKTARVY) 50-200-25 MG TABS tablet   ketoconazole (NIZORAL) 2 % cream   Other Relevant Orders   COMPLETE METABOLIC PANEL WITH GFR   HIV-1 RNA quant-no reflex-bld   T-helper cell (CD4)- ([facility] clinic only)   Healthcare maintenance    Discussed importance of safe sexual practice and condom use. Condoms and STD testing offered.  Vaccinations reviewed - influenza updated.  Attempting  to  get colonoscopy for colon cancer screening.  Due for routine dental care - reminded to schedule appointment.       Numbness of right lower extremity   Relevant Medications   gabapentin (NEURONTIN) 600 MG tablet   Mood swings    Mood is stable with current dose of fluoxetine and quetiapine and no adverse side effects. Continue current dose of fluoxetine and quetiapine.       Venous stasis    Seen by Vascular Surgery and wound is healing well with prescribed interventions and wound care. Continue management per Vascular Surgery.       Other Visit Diagnoses     Encounter for immunization       Relevant Orders   Flu vaccine trivalent PF, 6mos and older(Flulaval,Afluria,Fluarix,Fluzone) (Completed)        I have discontinued Theophilus Kinds. Lecuyer's Anoro Ellipta. I am also having him maintain his valACYclovir, sildenafil, triamcinolone ointment, naproxen, diclofenac Sodium, omeprazole, albuterol, Biktarvy, FLUoxetine, Trelegy Ellipta, gabapentin, QUEtiapine, and ketoconazole.   Meds ordered this encounter  Medications   albuterol (VENTOLIN HFA) 108 (90 Base) MCG/ACT inhaler    Sig: Inhale 2 puffs into the lungs every 6 (six) hours as needed for wheezing or shortness of breath.    Dispense:  54 g    Refill:  3    Order Specific Question:   Supervising Provider    Answer:   Drue Second, CYNTHIA [[id number]]   bictegravir-emtricitabine-tenofovir AF (BIKTARVY) 50-200-25 MG TABS tablet    Sig: Take 1 tablet by mouth daily.    Dispense:  30 tablet    Refill:  5    Order Specific Question:   Supervising Provider    Answer:   Judyann Munson [phone]    Order Specific Question:   Prescription Type:    Answer:   Renewal   FLUoxetine (PROZAC) 40 MG capsule    Sig: Take 1 capsule (40 mg total) by mouth daily.    Dispense:  90 capsule    Refill:  1    Order Specific Question:   Supervising Provider    Answer:   Judyann Munson [[id number]]   Fluticasone-Umeclidin-Vilant (TRELEGY ELLIPTA) 100-62.5-25  MCG/ACT AEPB    Sig: Inhale 1 Inhalation into the lungs daily.    Dispense:  28 each    Refill:  3    Order Specific Question:   Supervising Provider    Answer:   Drue Second, CYNTHIA [[id number]]   gabapentin (NEURONTIN) 600 MG tablet    Sig: Take 2 tablets (1,200 mg total) by mouth 3 (three) times daily.    Dispense:  180 tablet    Refill:  0    Order Specific Question:   Supervising Provider    Answer:   Drue Second, CYNTHIA [[id number]]   QUEtiapine (SEROQUEL) 200 MG tablet    Sig: Take 1 tablet (200 mg total) by mouth at bedtime.    Dispense:  90 tablet    Refill:  1    Order Specific Question:   Supervising Provider    Answer:   Drue Second, CYNTHIA [[id number]]   ketoconazole (NIZORAL) 2 % cream    Sig: Apply topically daily. On rash on bottom and abdomen    Dispense:  15 g    Refill:  0    Order Specific Question:   Supervising Provider    Answer:   Judyann Munson [[id number]]     Follow-up: Return in about 2 months (around 04/12/2023), or if symptoms worsen or fail to improve. or sooner  if needed.    Marcos Eke, MSN, FNP-C Nurse Practitioner [facility] for Infectious Disease [facility] Main number: [phone]

## 2023-02-10 NOTE — Assessment & Plan Note (Signed)
Mood is stable with current dose of fluoxetine and quetiapine and no adverse side effects. Continue current dose of fluoxetine and quetiapine.

## 2023-02-10 NOTE — Assessment & Plan Note (Signed)
Mr. Graulich continues to have less than optimally controlled COPD having increased albuterol usage despite change to Trelegy. Recommend establishing with Pulmonology for further evaluation and management of COPD. Will continue current dose of Trelegy and as needed albuterol for now.

## 2023-02-10 NOTE — Assessment & Plan Note (Signed)
Discussed importance of safe sexual practice and condom use. Condoms and STD testing offered.  Vaccinations reviewed - influenza updated.  Attempting to get colonoscopy for colon cancer screening.  Due for routine dental care - reminded to schedule appointment.

## 2023-02-10 NOTE — Assessment & Plan Note (Signed)
[name] continues to have well controlled virus with good adherence and tolerance to [employer].  Reviewed lab work and discussed plan of care, U equals U, and family planning. Check lab work. Continue current dose of Biktarvy. Plan for follow up in  2 months or sooner if needed with lab work on the same day.

## 2023-02-10 NOTE — Patient Instructions (Addendum)
Nice to see you.  We will check your lab work today.  Continue to take your medication daily as prescribed.  Refills have been sent to the pharmacy.  Plan for follow up in 2 months or sooner if needed with lab work on the same day.  Have a great day and stay safe!

## 2023-02-11 LAB — T-HELPER CELL (CD4) - (RCID CLINIC ONLY)
CD4 % Helper T Cell: 21 % — ABNORMAL LOW (ref 33–65)
CD4 T Cell Abs: 348 /uL — ABNORMAL LOW (ref 400–1790)

## 2023-02-12 LAB — COMPLETE METABOLIC PANEL WITH GFR
AG Ratio: 1.4 (calc) (ref 1.0–2.5)
ALT: 43 U/L (ref 9–46)
AST: 33 U/L (ref 10–35)
Albumin: 4.5 g/dL (ref 3.6–5.1)
Alkaline phosphatase (APISO): 73 U/L (ref 35–144)
BUN: 15 mg/dL (ref 7–25)
CO2: 22 mmol/L (ref 20–32)
Calcium: 9.1 mg/dL (ref 8.6–10.3)
Chloride: 107 mmol/L (ref 98–110)
Creat: 1.22 mg/dL (ref 0.70–1.30)
Globulin: 3.2 g/dL (ref 1.9–3.7)
Glucose, Bld: 97 mg/dL (ref 65–99)
Potassium: 4 mmol/L (ref 3.5–5.3)
Sodium: 138 mmol/L (ref 135–146)
Total Bilirubin: 0.5 mg/dL (ref 0.2–1.2)
Total Protein: 7.7 g/dL (ref 6.1–8.1)
eGFR: 70 mL/min/{1.73_m2} (ref >=60)

## 2023-02-12 LAB — HIV-1 RNA QUANT-NO REFLEX-BLD
HIV 1 RNA Quant: <20 {copies}/mL — ABNORMAL HIGH
HIV-1 RNA Quant, Log: <1.3 {Log} — ABNORMAL HIGH

## 2023-02-17 DIAGNOSIS — B20 Human immunodeficiency virus [HIV] disease: Secondary | ICD-10-CM | POA: Diagnosis not present

## 2023-02-17 DIAGNOSIS — L988 Other specified disorders of the skin and subcutaneous tissue: Secondary | ICD-10-CM | POA: Diagnosis not present

## 2023-02-17 DIAGNOSIS — M1990 Unspecified osteoarthritis, unspecified site: Secondary | ICD-10-CM | POA: Diagnosis not present

## 2023-02-17 DIAGNOSIS — F419 Anxiety disorder, unspecified: Secondary | ICD-10-CM | POA: Diagnosis not present

## 2023-02-17 DIAGNOSIS — J45909 Unspecified asthma, uncomplicated: Secondary | ICD-10-CM | POA: Diagnosis not present

## 2023-02-17 DIAGNOSIS — I872 Venous insufficiency (chronic) (peripheral): Secondary | ICD-10-CM | POA: Diagnosis not present

## 2023-02-26 DIAGNOSIS — L988 Other specified disorders of the skin and subcutaneous tissue: Secondary | ICD-10-CM | POA: Diagnosis not present

## 2023-02-26 DIAGNOSIS — F419 Anxiety disorder, unspecified: Secondary | ICD-10-CM | POA: Diagnosis not present

## 2023-02-26 DIAGNOSIS — J45909 Unspecified asthma, uncomplicated: Secondary | ICD-10-CM | POA: Diagnosis not present

## 2023-02-26 DIAGNOSIS — I872 Venous insufficiency (chronic) (peripheral): Secondary | ICD-10-CM | POA: Diagnosis not present

## 2023-02-26 DIAGNOSIS — M1990 Unspecified osteoarthritis, unspecified site: Secondary | ICD-10-CM | POA: Diagnosis not present

## 2023-02-26 DIAGNOSIS — B20 Human immunodeficiency virus [HIV] disease: Secondary | ICD-10-CM | POA: Diagnosis not present

## 2023-03-02 ENCOUNTER — Telehealth: Payer: Self-pay

## 2023-03-02 NOTE — Telephone Encounter (Signed)
Horatio Pel, RN with [facility] called stating that she saw the pt today for a renewal. Pt has still not gotten his compression stockings despite being repeatedly instructed to do so. She told him that they would not be able to continue wrapping his legs with ACE. She placed an Radio broadcast assistant on him for compression. She stated that he does not currently have any swelling.  Reviewed pt's chart, returned call for clarification, two identifiers used. Informed her that his measurements were in his chart and he was given the information to order them online so he wouldn't have to come to [place] or [place]. She stated that he was well aware, but keeps saying he will do it, but doesn't.

## 2023-03-03 DIAGNOSIS — F419 Anxiety disorder, unspecified: Secondary | ICD-10-CM | POA: Diagnosis not present

## 2023-03-03 DIAGNOSIS — Z7951 Long term (current) use of inhaled steroids: Secondary | ICD-10-CM | POA: Diagnosis not present

## 2023-03-03 DIAGNOSIS — Z79899 Other long term (current) drug therapy: Secondary | ICD-10-CM | POA: Diagnosis not present

## 2023-03-03 DIAGNOSIS — M1990 Unspecified osteoarthritis, unspecified site: Secondary | ICD-10-CM | POA: Diagnosis not present

## 2023-03-03 DIAGNOSIS — F32A Depression, unspecified: Secondary | ICD-10-CM | POA: Diagnosis not present

## 2023-03-03 DIAGNOSIS — Z87891 Personal history of nicotine dependence: Secondary | ICD-10-CM | POA: Diagnosis not present

## 2023-03-03 DIAGNOSIS — J45909 Unspecified asthma, uncomplicated: Secondary | ICD-10-CM | POA: Diagnosis not present

## 2023-03-03 DIAGNOSIS — Z86711 Personal history of pulmonary embolism: Secondary | ICD-10-CM | POA: Diagnosis not present

## 2023-03-03 DIAGNOSIS — B20 Human immunodeficiency virus [HIV] disease: Secondary | ICD-10-CM | POA: Diagnosis not present

## 2023-03-03 DIAGNOSIS — I959 Hypotension, unspecified: Secondary | ICD-10-CM | POA: Diagnosis not present

## 2023-03-03 DIAGNOSIS — Z8701 Personal history of pneumonia (recurrent): Secondary | ICD-10-CM | POA: Diagnosis not present

## 2023-03-03 DIAGNOSIS — Z96643 Presence of artificial hip joint, bilateral: Secondary | ICD-10-CM | POA: Diagnosis not present

## 2023-03-03 DIAGNOSIS — L988 Other specified disorders of the skin and subcutaneous tissue: Secondary | ICD-10-CM | POA: Diagnosis not present

## 2023-03-03 DIAGNOSIS — I872 Venous insufficiency (chronic) (peripheral): Secondary | ICD-10-CM | POA: Diagnosis not present

## 2023-03-09 DIAGNOSIS — J45909 Unspecified asthma, uncomplicated: Secondary | ICD-10-CM | POA: Diagnosis not present

## 2023-03-09 DIAGNOSIS — I872 Venous insufficiency (chronic) (peripheral): Secondary | ICD-10-CM | POA: Diagnosis not present

## 2023-03-09 DIAGNOSIS — B20 Human immunodeficiency virus [HIV] disease: Secondary | ICD-10-CM | POA: Diagnosis not present

## 2023-03-09 DIAGNOSIS — M1990 Unspecified osteoarthritis, unspecified site: Secondary | ICD-10-CM | POA: Diagnosis not present

## 2023-03-09 DIAGNOSIS — L988 Other specified disorders of the skin and subcutaneous tissue: Secondary | ICD-10-CM | POA: Diagnosis not present

## 2023-03-09 DIAGNOSIS — F419 Anxiety disorder, unspecified: Secondary | ICD-10-CM | POA: Diagnosis not present

## 2023-03-12 ENCOUNTER — Telehealth: Payer: Self-pay

## 2023-03-12 NOTE — Telephone Encounter (Signed)
Samantha from [facility] called and was requesting verbal orders to recert patient for nursing visits 1x week for 5 wks.  Reviewed pt's chart, returned call for clarification, two identifiers used. Verbal orders given to [facility].

## 2023-03-16 DIAGNOSIS — J45909 Unspecified asthma, uncomplicated: Secondary | ICD-10-CM | POA: Diagnosis not present

## 2023-03-16 DIAGNOSIS — L988 Other specified disorders of the skin and subcutaneous tissue: Secondary | ICD-10-CM | POA: Diagnosis not present

## 2023-03-16 DIAGNOSIS — M1990 Unspecified osteoarthritis, unspecified site: Secondary | ICD-10-CM | POA: Diagnosis not present

## 2023-03-16 DIAGNOSIS — B20 Human immunodeficiency virus [HIV] disease: Secondary | ICD-10-CM | POA: Diagnosis not present

## 2023-03-16 DIAGNOSIS — F419 Anxiety disorder, unspecified: Secondary | ICD-10-CM | POA: Diagnosis not present

## 2023-03-16 DIAGNOSIS — I872 Venous insufficiency (chronic) (peripheral): Secondary | ICD-10-CM | POA: Diagnosis not present

## 2023-03-23 DIAGNOSIS — L988 Other specified disorders of the skin and subcutaneous tissue: Secondary | ICD-10-CM | POA: Diagnosis not present

## 2023-03-23 DIAGNOSIS — I872 Venous insufficiency (chronic) (peripheral): Secondary | ICD-10-CM | POA: Diagnosis not present

## 2023-03-23 DIAGNOSIS — J45909 Unspecified asthma, uncomplicated: Secondary | ICD-10-CM | POA: Diagnosis not present

## 2023-03-23 DIAGNOSIS — M1990 Unspecified osteoarthritis, unspecified site: Secondary | ICD-10-CM | POA: Diagnosis not present

## 2023-03-23 DIAGNOSIS — F419 Anxiety disorder, unspecified: Secondary | ICD-10-CM | POA: Diagnosis not present

## 2023-03-23 DIAGNOSIS — B20 Human immunodeficiency virus [HIV] disease: Secondary | ICD-10-CM | POA: Diagnosis not present

## 2023-03-25 ENCOUNTER — Other Ambulatory Visit: Payer: Self-pay | Admitting: Family

## 2023-03-25 DIAGNOSIS — K2100 Gastro-esophageal reflux disease with esophagitis, without bleeding: Secondary | ICD-10-CM

## 2023-03-28 ENCOUNTER — Other Ambulatory Visit: Payer: Self-pay | Admitting: Family

## 2023-03-30 NOTE — Telephone Encounter (Signed)
 Okay to refill? Patient called back and says he still needs it.   Sandie Ano, RN

## 2023-03-30 NOTE — Telephone Encounter (Signed)
 Called patient to see if he needed a refill. Someone else answered and stated they would have him call right back.   Sandie Ano, RN

## 2023-03-31 DIAGNOSIS — J45909 Unspecified asthma, uncomplicated: Secondary | ICD-10-CM | POA: Diagnosis not present

## 2023-03-31 DIAGNOSIS — I872 Venous insufficiency (chronic) (peripheral): Secondary | ICD-10-CM | POA: Diagnosis not present

## 2023-03-31 DIAGNOSIS — M1990 Unspecified osteoarthritis, unspecified site: Secondary | ICD-10-CM | POA: Diagnosis not present

## 2023-03-31 DIAGNOSIS — B20 Human immunodeficiency virus [HIV] disease: Secondary | ICD-10-CM | POA: Diagnosis not present

## 2023-03-31 DIAGNOSIS — F419 Anxiety disorder, unspecified: Secondary | ICD-10-CM | POA: Diagnosis not present

## 2023-03-31 DIAGNOSIS — L988 Other specified disorders of the skin and subcutaneous tissue: Secondary | ICD-10-CM | POA: Diagnosis not present

## 2023-04-08 ENCOUNTER — Ambulatory Visit (INDEPENDENT_AMBULATORY_CARE_PROVIDER_SITE_OTHER): Payer: Medicare Other | Admitting: Vascular Surgery

## 2023-04-08 ENCOUNTER — Encounter: Payer: Self-pay | Admitting: Vascular Surgery

## 2023-04-08 VITALS — BP 133/88 | HR 67 | Temp 97.9°F | Resp 20 | Ht 73.0 in | Wt 243.0 lb

## 2023-04-08 DIAGNOSIS — I83023 Varicose veins of left lower extremity with ulcer of ankle: Secondary | ICD-10-CM | POA: Diagnosis not present

## 2023-04-08 DIAGNOSIS — L97329 Non-pressure chronic ulcer of left ankle with unspecified severity: Secondary | ICD-10-CM

## 2023-04-08 NOTE — Progress Notes (Signed)
Patient ID: [name], male   DOB: 1966-02-08, 58 y.o.   MRN: [id number]  Reason for Consult: Follow-up   Referred by No ref. provider found  Subjective:     HPI:  [name] is a 58 y.o. male history of previous venous ulceration with DVTs and PE not currently maintained on anticoagulation but does take baby aspirin daily.  He has been in [employer] and has healed his wounds with the help of home health care.  He currently wears compression stockings daily has not had any recent ulceration but does have discomfort and swelling of the left lower extremity particular when he does not have his compression stockings on.  He also has varicosities of the right lower extremity which are somewhat bothersome but he only has mild swelling and no history of ulceration.  Past Medical History:  Diagnosis Date   Anxiety    Arthritis    Asthma    Depression    History of Clostridium difficile colitis 04/22/2017   History of pulmonary embolus (PE) 03/17/2013   Overview:  post hip surgery, on blood thinners at the time   HIV (human immunodeficiency virus infection) (HCC)    Hypokalemia    Hypotension 01/25/2015   Immune deficiency disorder (HCC)    Infectious folliculitis 01/30/2017   MRSA (methicillin resistant staph aureus) culture positive    Sepsis (HCC)    Streptococcal pneumonia (HCC)    Family History  Problem Relation Age of Onset   Cancer Mother    CAD Brother    Past Surgical History:  Procedure Laterality Date   hip arthroplasty Left 2016   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, I&D with replacement in 2017   TOTAL HIP ARTHROPLASTY Right 02/09/2018   Procedure: TOTAL HIP ARTHROPLASTY ANTERIOR APPROACH;  Surgeon: Durene Romans, MD;  Location: [facility];  Service: Orthopedics;  Laterality: Right;  70 mins    Short Social History:  Social History   Tobacco Use   Smoking status: Former    Types: Cigarettes   Smokeless tobacco: Current    Types: Chew  Substance Use  Topics   Alcohol use: Yes    Comment: daily to occasionally    Allergies  Allergen Reactions   Hydrocodone Other (See Comments)    "makes me overly sleepy"   Sulfa Antibiotics Rash   Tramadol Nausea Only    Current Outpatient Medications  Medication Sig Dispense Refill   albuterol (VENTOLIN HFA) 108 (90 Base) MCG/ACT inhaler Inhale 2 puffs into the lungs every 6 (six) hours as needed for wheezing or shortness of breath. 54 g 3   bictegravir-emtricitabine-tenofovir AF (BIKTARVY) 50-200-25 MG TABS tablet Take 1 tablet by mouth daily. 30 tablet 5   diclofenac Sodium (VOLTAREN) 1 % GEL Apply 2 g topically 4 (four) times daily. 100 g 2   FLUoxetine (PROZAC) 40 MG capsule Take 1 capsule (40 mg total) by mouth daily. 90 capsule 1   Fluticasone-Umeclidin-Vilant (TRELEGY ELLIPTA) 100-62.5-25 MCG/ACT AEPB Inhale 1 Inhalation into the lungs daily. 28 each 3   gabapentin (NEURONTIN) 600 MG tablet Take 2 tablets (1,200 mg total) by mouth 3 (three) times daily. 180 tablet 0   ketoconazole (NIZORAL) 2 % cream APPLY TOPICALLY DAILY. ON RASH ON BOTTOM AND ABDOMEN 15 g 0   naproxen (NAPROSYN) 500 MG tablet TAKE 1 TABLET BY MOUTH TWICE A DAY WITH FOOD 60 tablet 0   omeprazole (PRILOSEC) 20 MG capsule TAKE 1 CAPSULE BY MOUTH EVERY DAY 90 capsule 1  QUEtiapine (SEROQUEL) 200 MG tablet Take 1 tablet (200 mg total) by mouth at bedtime. 90 tablet 1   sildenafil (VIAGRA) 50 MG tablet TAKE 1/2 (ONE-HALF) TABLET BY MOUTH ONCE DAILY AS NEEDED 10 tablet 0   triamcinolone ointment (KENALOG) 0.5 % Apply 1 Application topically 2 (two) times daily. 30 g 3   valACYclovir (VALTREX) 500 MG tablet Take 1 tablet (500 mg total) by mouth daily. 30 tablet 11   No current facility-administered medications for this visit.    Review of Systems  Constitutional:  Constitutional negative. HENT: HENT negative.  Eyes: Eyes negative.  Respiratory: Respiratory negative.  Cardiovascular: Positive for leg swelling.  GI:  Gastrointestinal negative.  Musculoskeletal: Positive for leg pain.  Neurological: Neurological negative. Hematologic: Hematologic/lymphatic negative.  Psychiatric: Psychiatric negative.        Objective:  Objective   Vitals:   04/08/23 1127  BP: 133/88  Pulse: 67  Resp: 20  Temp: 97.9 F (36.6 C)  SpO2: 95%  Weight: 243 lb (110.2 kg)  Height: 6\' 1"  (1.854 m)   Body mass index is 32.06 kg/m.  Physical Exam HENT:     Head: Normocephalic.     Nose: Nose normal.  Eyes:     Pupils: Pupils are equal, round, and reactive to light.  Cardiovascular:     Rate and Rhythm: Normal rate.     Pulses: Normal pulses.  Pulmonary:     Effort: Pulmonary effort is normal.  Abdominal:     General: Abdomen is flat.  Musculoskeletal:        General: Normal range of motion.     Cervical back: Normal range of motion.     Right lower leg: No edema.     Left lower leg: Edema present.  Skin:    General: Skin is warm.     Capillary Refill: Capillary refill takes less than 2 seconds.  Neurological:     General: No focal deficit present.     Mental Status: He is alert.  Psychiatric:        Mood and Affect: Mood normal.         Data: Left          Reflux NoRefluxReflux TimeDiameter cmsComments                                        Yes                                                 +--------------+---------+------+-----------+------------+-----------------  ----+  CFV                    yes   >1 second                                     +--------------+---------+------+-----------+------------+-----------------  ----+  FV prox                 yes   >1 second                                     +--------------+---------+------+-----------+------------+-----------------  ----+  FV mid                  yes   >1 second                                     +--------------+---------+------+-----------+------------+-----------------  ----+   FV dist                 yes   >1 second                                     +--------------+---------+------+-----------+------------+-----------------  ----+  Popliteal              yes   >1 second                                     +--------------+---------+------+-----------+------------+-----------------  ----+  GSV at [facility]              yes    >500 ms     0.766                            +--------------+---------+------+-----------+------------+-----------------  ----+  GSV prox thigh          yes    >500 ms     0.809                            +--------------+---------+------+-----------+------------+-----------------  ----+  GSV mid thigh           yes    >500 ms     0.589                            +--------------+---------+------+-----------+------------+-----------------  ----+  GSV dist thigh          yes    >500 ms     0.644                            +--------------+---------+------+-----------+------------+-----------------  ----+  GSV at knee             yes    >500 ms     0.547                            +--------------+---------+------+-----------+------------+-----------------  ----+  GSV prox calf           yes    >500 ms     0.393                            +--------------+---------+------+-----------+------------+-----------------  ----+  SSV Pop Fossa no                                    The SPJ with  retrograde flow  into                                                       the non  refluxing                                                          thigh  extenstion.      +--------------+---------+------+-----------+------------+-----------------  ----+  SSV prox calf no               >500 ms     0.361                             +--------------+---------+------+-----------+------------+-----------------  ----+  SSV mid calf  no               >500 ms     0.371                            +--------------+---------+------+-----------+------------+-----------------  ----+         Summary:  Left:  - No evidence of deep vein thrombosis seen in the left lower extremity,  from the common femoral through the popliteal veins.  - No evidence of superficial venous thrombosis in the left lower  extremity.    - Deep vein reflux in the CFV, FV, and popliteal vein.   - Superficial vein reflux in the SSV, SFJ, and GSV.  - The SPJ with retrograde flow into the non refluxing thigh extenstion.         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  His left greater saphenous vein was evaluated at bedside today noted to be within the fascia throughout the entirety of the upper leg and is also very large measuring upwards of 1 cm in the mid segment of the upper leg.  We have discussed the need for great saphenous vein ablation to prevent recurrence of ulceration and also the treatment of symptomatic varicosities with stab phlebectomy between 10-20.  We discussed the need for continued compression stockings likely lifelong even after treatment and particularly perioperatively.  We discussed the risk and benefits particularly risk of DVT and need for minimal activity for the first week and after the procedure and up to 2 weeks of nothing more strenuous than walking.  Patient demonstrates good understanding we will continue baby aspirin throughout the procedure and we will get him scheduled on a Thursday in the near future.     Maeola Harman MD [facility]

## 2023-04-14 ENCOUNTER — Other Ambulatory Visit: Payer: Self-pay | Admitting: *Deleted

## 2023-04-14 ENCOUNTER — Ambulatory Visit: Payer: Medicare Other | Admitting: Family

## 2023-04-14 DIAGNOSIS — I83892 Varicose veins of left lower extremities with other complications: Secondary | ICD-10-CM

## 2023-04-15 ENCOUNTER — Ambulatory Visit: Payer: Medicare Other | Admitting: Family

## 2023-05-06 ENCOUNTER — Other Ambulatory Visit: Payer: Self-pay | Admitting: Family

## 2023-05-07 NOTE — Telephone Encounter (Signed)
 Patient has appt on 2/24 - please advise if okay to refill.

## 2023-05-11 ENCOUNTER — Other Ambulatory Visit: Payer: Self-pay

## 2023-05-11 ENCOUNTER — Encounter: Payer: Self-pay | Admitting: Family

## 2023-05-11 ENCOUNTER — Ambulatory Visit (INDEPENDENT_AMBULATORY_CARE_PROVIDER_SITE_OTHER): Payer: Medicare Other | Admitting: Family

## 2023-05-11 VITALS — BP 133/91 | HR 79 | Temp 97.3°F | Ht 74.0 in | Wt 238.0 lb

## 2023-05-11 DIAGNOSIS — Z21 Asymptomatic human immunodeficiency virus [HIV] infection status: Secondary | ICD-10-CM | POA: Diagnosis not present

## 2023-05-11 DIAGNOSIS — Z0000 Encounter for general adult medical examination without abnormal findings: Secondary | ICD-10-CM

## 2023-05-11 DIAGNOSIS — Z9189 Other specified personal risk factors, not elsewhere classified: Secondary | ICD-10-CM

## 2023-05-11 DIAGNOSIS — R079 Chest pain, unspecified: Secondary | ICD-10-CM

## 2023-05-11 DIAGNOSIS — I878 Other specified disorders of veins: Secondary | ICD-10-CM | POA: Diagnosis not present

## 2023-05-11 DIAGNOSIS — J449 Chronic obstructive pulmonary disease, unspecified: Secondary | ICD-10-CM

## 2023-05-11 DIAGNOSIS — R4586 Emotional lability: Secondary | ICD-10-CM | POA: Diagnosis not present

## 2023-05-11 NOTE — Assessment & Plan Note (Signed)
 [name] continues to have well-controlled virus with good adherence and tolerance to [employer].  Reviewed previous lab work and discussed plan of care and U equals U.  No problems obtaining medication from the pharmacy and covered by Medicare and Medicaid.  Social determinants of health reviewed with no interventions indicated.  Plan for blood work at next office visit.  Continue current dose of Biktarvy.  Plan for follow-up in 2 months or sooner if needed.

## 2023-05-11 NOTE — Patient Instructions (Addendum)
 Nice to see you.  Continue to take your medication daily as prescribed.  Refills have been sent to the pharmacy.   Referrals have been sent to Gastroenterology and Pulmonology.   Plan for follow up in 2 months or sooner if needed with lab work on the same day.  Have a great day and stay safe!

## 2023-05-11 NOTE — Assessment & Plan Note (Signed)
 Gennie continues to take Trelegy daily and use albuterol as needed. Concerned for worsening COPD despite good adherence and tolerance to medication. Check chest x-ray. Referral placed to [facility] Pulmonology as he has been unable to get connected with [place]. Continue current dose of Trelegy and albuterol.

## 2023-05-11 NOTE — Assessment & Plan Note (Signed)
 [name] continues to have venous stasis with now worsening wound since wound care stopped.  Has appointment upcoming with vascular surgery for intervention.  Continue leg elevation and wound care per vascular surgery recommendations.

## 2023-05-11 NOTE — Assessment & Plan Note (Signed)
 [name]'s mood is remained stable with current dose of fluoxetine and quetiapine.  Sleeping well.  No suicidal ideations or signs of psychosis.  Continue current dose of quetiapine and fluoxetine.

## 2023-05-11 NOTE — Progress Notes (Signed)
 Brief Narrative   Patient ID: [name], male    DOB: [birth year], 58 y.o.   MRN: [id number]  [name] is a 58 y/o caucasian male with HIV disease diagnosed in 1995 with risk factor of sexual contact. Initial viral load and CD4 count are not available. Genotype from 03/07/13 with no significant medication resistant mutations.No history of opportunistic infection. Previous ART experience with nelfinavir, combivir, truvada, tenofovir, raltegravir, atazanavir, ritonivir, Symtuza and Tivicay.    Subjective:    Chief Complaint  Patient presents with   Follow-up    B20    HPI:  [name] is a 58 y.o. male with HIV disease last seen on 02/10/2023 with well-controlled virus and good adherence and tolerance to [employer].  Viral load was undetectable with CD4 count 348.  Mood swings were stable with current dose of fluoxetine and quetiapine.  Wound was healing well following a visit to vascular surgery on his lower leg.  Continued on Trelegy for COPD with albuterol as needed.  Here today for routine follow-up.  [name] has been doing well since his last office visit and continues to take medications as prescribed with no adverse side effects or problems obtaining them from the pharmacy.  Remains covered by Medicare/Medicaid.  Has noticed some chest pain recently.  Working on losing weight to help with his symptoms.  Rash on his lower left leg has returned with vascular surgery having plans for surgical intervention in the near future.  Has yet to be able to get up with pulmonology for COPD treatment.  Condoms and site-specific STD testing offered.  Healthcare maintenance reviewed.  Due for routine dental care.  Denies fevers, chills, night sweats, headaches, changes in vision, neck pain/stiffness, nausea, diarrhea, vomiting, lesions or rashes.  Lab Results  Component Value Date   CD4TCELL 21 (L) 02/10/2023   CD4TABS 348 (L) 02/10/2023   Lab Results  Component Value Date    HIV1RNAQUANT <20 (H) 02/10/2023     Allergies  Allergen Reactions   Hydrocodone Other (See Comments)    "makes me overly sleepy"   Sulfa Antibiotics Rash   Tramadol Nausea Only      Outpatient Medications Prior to Visit  Medication Sig Dispense Refill   albuterol (VENTOLIN HFA) 108 (90 Base) MCG/ACT inhaler Inhale 2 puffs into the lungs every 6 (six) hours as needed for wheezing or shortness of breath. 54 g 3   bictegravir-emtricitabine-tenofovir AF (BIKTARVY) 50-200-25 MG TABS tablet Take 1 tablet by mouth daily. 30 tablet 5   diclofenac Sodium (VOLTAREN) 1 % GEL Apply 2 g topically 4 (four) times daily. 100 g 2   FLUoxetine (PROZAC) 40 MG capsule Take 1 capsule (40 mg total) by mouth daily. 90 capsule 1   Fluticasone-Umeclidin-Vilant (TRELEGY ELLIPTA) 100-62.5-25 MCG/ACT AEPB Inhale 1 Inhalation into the lungs daily. 28 each 3   gabapentin (NEURONTIN) 600 MG tablet Take 2 tablets (1,200 mg total) by mouth 3 (three) times daily. 180 tablet 0   ketoconazole (NIZORAL) 2 % cream APPLY TOPICALLY DAILY. ON RASH ON BOTTOM AND ABDOMEN 15 g 0   naproxen (NAPROSYN) 500 MG tablet TAKE 1 TABLET BY MOUTH TWICE A DAY WITH FOOD 60 tablet 0   omeprazole (PRILOSEC) 20 MG capsule TAKE 1 CAPSULE BY MOUTH EVERY DAY 90 capsule 1   QUEtiapine (SEROQUEL) 200 MG tablet Take 1 tablet (200 mg total) by mouth at bedtime. 90 tablet 1   sildenafil (VIAGRA) 50 MG tablet TAKE 1/2 (ONE-HALF) TABLET BY MOUTH  ONCE DAILY AS NEEDED 10 tablet 0   triamcinolone ointment (KENALOG) 0.5 % Apply 1 Application topically 2 (two) times daily. 30 g 3   valACYclovir (VALTREX) 500 MG tablet Take 1 tablet (500 mg total) by mouth daily. 30 tablet 11   No facility-administered medications prior to visit.     Past Medical History:  Diagnosis Date   Anxiety    Arthritis    Asthma    Depression    History of Clostridium difficile colitis 04/22/2017   History of pulmonary embolus (PE) 03/17/2013   Overview:  post hip surgery, on  blood thinners at the time   HIV (human immunodeficiency virus infection) (HCC)    Hypokalemia    Hypotension 01/25/2015   Immune deficiency disorder (HCC)    Infectious folliculitis 01/30/2017   MRSA (methicillin resistant staph aureus) culture positive    Sepsis (HCC)    Streptococcal pneumonia [facility])      Past Surgical History:  Procedure Laterality Date   hip arthroplasty Left 2016   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, I&D with replacement in 2017   TOTAL HIP ARTHROPLASTY Right 02/09/2018   Procedure: TOTAL HIP ARTHROPLASTY ANTERIOR APPROACH;  Surgeon: Durene Romans, MD;  Location: [facility];  Service: Orthopedics;  Laterality: Right;  70 mins      Review of Systems  Constitutional:  Negative for appetite change, chills, fatigue, fever and unexpected weight change.  Eyes:  Negative for visual disturbance.  Respiratory:  Positive for shortness of breath. Negative for cough, chest tightness and wheezing.   Cardiovascular:  Positive for chest pain. Negative for leg swelling.  Gastrointestinal:  Negative for abdominal pain, constipation, diarrhea, nausea and vomiting.  Genitourinary:  Negative for dysuria, flank pain, frequency, genital sores, hematuria and urgency.  Skin:  Negative for rash.  Allergic/Immunologic: Negative for immunocompromised state.  Neurological:  Negative for dizziness and headaches.      Objective:    BP (!) 133/91   Pulse 79   Temp (!) 97.3 F (36.3 C) (Temporal)   Ht 6\' 2"  (1.88 m)   Wt 238 lb (108 kg)   SpO2 98%   BMI 30.56 kg/m  Nursing note and vital signs reviewed.  Physical Exam Constitutional:      General: He is not in acute distress.    Appearance: He is well-developed.  Eyes:     Conjunctiva/sclera: Conjunctivae normal.  Cardiovascular:     Rate and Rhythm: Normal rate and regular rhythm.     Heart sounds: Normal heart sounds. No murmur heard.    No friction rub. No gallop.  Pulmonary:     Effort: Pulmonary effort is normal.  No respiratory distress.     Breath sounds: Wheezing present. No rales.  Chest:     Chest wall: No tenderness.  Abdominal:     General: Bowel sounds are normal.     Palpations: Abdomen is soft.     Tenderness: There is no abdominal tenderness.  Musculoskeletal:     Cervical back: Neck supple.  Lymphadenopathy:     Cervical: No cervical adenopathy.  Skin:    General: Skin is warm and dry.     Findings: No rash.  Neurological:     Mental Status: He is alert and oriented to person, place, and time.  Psychiatric:        Behavior: Behavior normal.        Thought Content: Thought content normal.        Judgment: Judgment normal.  05/11/2023    1:18 PM 02/10/2023   10:25 AM 08/04/2022   11:13 AM 03/18/2022   11:03 AM 11/04/2021    3:52 PM  Depression screen PHQ 2/9  Decreased Interest 0 0 0 0   Down, Depressed, Hopeless 0 0 0 0 0  PHQ - 2 Score 0 0 0 0 0  Altered sleeping     0  Tired, decreased energy     1  Change in appetite     0  Feeling bad or failure about yourself      0  Trouble concentrating     0  Moving slowly or fidgety/restless     0  Suicidal thoughts     0  PHQ-9 Score     1       Assessment & Plan:    Patient Active Problem List   Diagnosis Date Noted   HIV (human immunodeficiency virus infection) (HCC) 01/24/2015    Priority: High   Venous stasis 11/25/2022   Rash and nonspecific skin eruption 03/18/2022   Reactive airway disease 11/04/2021   Shoulder pain, right 11/04/2021   Alcohol abuse 09/26/2021   Erysipelas of left lower extremity 10/24/2020   Mood swings 10/24/2020   Erectile dysfunction 04/11/2019   Generalized anxiety disorder with panic attacks 10/25/2018   Anxiety about health 10/25/2018   Healthcare maintenance 07/06/2018   Numbness of right lower extremity 07/06/2018   Heartburn 07/06/2018   S/P right THA, AA 02/09/2018   S/P hip replacement 02/09/2018   Essential hypertension    Lung abscess (HCC) 06/21/2017   Lung  nodule, multiple 06/14/2017   COPD (chronic obstructive pulmonary disease) (HCC) 04/22/2017   Complication of implanted device 07/24/2015   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 Concerned for worsening COPD despite good adherence and tolerance to medication. Check chest x-ray. Referral placed to [facility] Pulmonology as he has been unable to get connected with [place]. Continue current dose of Trelegy and albuterol.       Relevant Orders   DG Chest 2 View   Ambulatory referral to Pulmonology     Other   HIV (human immunodeficiency virus infection) (HCC) - Primary   [name] continues to have well-controlled virus with good adherence and tolerance to [employer].  Reviewed previous lab work and discussed plan of care and U equals U.  No problems obtaining medication from the pharmacy and covered by Medicare and Medicaid.  Social determinants of health reviewed with no interventions indicated.  Plan for blood work at next office visit.  Continue current dose of Biktarvy.  Plan for follow-up in 2 months or sooner if needed.      Relevant Orders   AMB REFERRAL TO [facility]ICE [facility]ENC[facility]   Healthcare maintenance   Discussed importance of safe sexual practice and condom use. Condoms and site specific STD testing offered.  Vaccinations reviewed - declined following counseling Due for colon cancer screening with referral to GI placed.  Due for dental care with referral to [facility] placed.       Mood  swings   [name] mood is remained stable with current  dose of fluoxetine and quetiapine.  Sleeping well.  No suicidal ideations or signs of psychosis.  Continue current dose of quetiapine and fluoxetine.      Venous stasis   [name] continues to have venous stasis with now worsening wound since wound care stopped.  Has appointment upcoming with vascular surgery for intervention.  Continue leg elevation and wound care per vascular surgery recommendations.      Other Visit Diagnoses       Chest pain, unspecified type       Relevant Orders   DG Chest 2 View     At risk for colon cancer       Relevant Orders   Ambulatory referral to Gastroenterology        I am having [name]. [name] maintain his valACYclovir, sildenafil, triamcinolone ointment, naproxen, diclofenac Sodium, albuterol, Biktarvy, FLUoxetine, Trelegy Ellipta, gabapentin, QUEtiapine, omeprazole, and ketoconazole.   Follow-up: Return in about 2 months (around 07/09/2023). or sooner if needed.    Marcos Eke, MSN, FNP-C Nurse Practitioner [facility] for Infectious Disease [facility] Main number: [phone]

## 2023-05-11 NOTE — Assessment & Plan Note (Signed)
 Discussed importance of safe sexual practice and condom use. Condoms and site specific STD testing offered.  Vaccinations reviewed - declined following counseling Due for colon cancer screening with referral to GI placed.  Due for dental care with referral to [facility] placed.

## 2023-05-27 ENCOUNTER — Other Ambulatory Visit: Payer: Self-pay | Admitting: *Deleted

## 2023-05-27 MED ORDER — LORAZEPAM 1 MG PO TABS: ORAL_TABLET | ORAL | 0 refills | Status: DC

## 2023-06-04 ENCOUNTER — Ambulatory Visit (INDEPENDENT_AMBULATORY_CARE_PROVIDER_SITE_OTHER): Payer: Medicare Other | Admitting: Vascular Surgery

## 2023-06-04 ENCOUNTER — Encounter: Payer: Self-pay | Admitting: Vascular Surgery

## 2023-06-04 VITALS — BP 127/84 | HR 64 | Temp 97.5°F | Resp 18 | Ht 74.0 in | Wt 237.0 lb

## 2023-06-04 DIAGNOSIS — I83892 Varicose veins of left lower extremities with other complications: Secondary | ICD-10-CM | POA: Diagnosis not present

## 2023-06-04 HISTORY — PX: ENDOVENOUS ABLATION SAPHENOUS VEIN W/ LASER: SUR449

## 2023-06-04 NOTE — Progress Notes (Signed)
 Patient name: [name] MRN: [id number] DOB: 09/21/1965 Sex: male  REASON FOR VISIT: Treatment of left lower extremity chronic insufficiency  HPI: [name] is a 58 y.o. male with history of C5 venous disease secondary to very large refluxing greater saphenous vein.  Plan is to proceed with left great saphenous vein ablation to prevent recurrence of ulceration in the treatment of symptomatic varicosities.  He has been compliant with thigh-high compression stockings and presents today for left greater saphenous vein ablation and stab phlebectomy to treat symptomatic varicosities.  Current Outpatient Medications  Medication Sig Dispense Refill   albuterol (VENTOLIN HFA) 108 (90 Base) MCG/ACT inhaler Inhale 2 puffs into the lungs every 6 (six) hours as needed for wheezing or shortness of breath. 54 g 3   bictegravir-emtricitabine-tenofovir AF (BIKTARVY) 50-200-25 MG TABS tablet Take 1 tablet by mouth daily. 30 tablet 5   diclofenac Sodium (VOLTAREN) 1 % GEL Apply 2 g topically 4 (four) times daily. 100 g 2   FLUoxetine (PROZAC) 40 MG capsule Take 1 capsule (40 mg total) by mouth daily. 90 capsule 1   Fluticasone-Umeclidin-Vilant (TRELEGY ELLIPTA) 100-62.5-25 MCG/ACT AEPB Inhale 1 Inhalation into the lungs daily. 28 each 3   gabapentin (NEURONTIN) 600 MG tablet Take 2 tablets (1,200 mg total) by mouth 3 (three) times daily. 180 tablet 0   ketoconazole (NIZORAL) 2 % cream APPLY TOPICALLY DAILY. ON RASH ON BOTTOM AND ABDOMEN 15 g 0   LORazepam (ATIVAN) 1 MG tablet Take 1 tablet 30 to 60 minutes before leaving house on day of office surgery.  Bring second tablet with you to office on day of office surgery. 2 tablet 0   naproxen (NAPROSYN) 500 MG tablet TAKE 1 TABLET BY MOUTH TWICE A DAY WITH FOOD 60 tablet 0   omeprazole (PRILOSEC) 20 MG capsule TAKE 1 CAPSULE BY MOUTH EVERY DAY 90 capsule 1   QUEtiapine (SEROQUEL) 200 MG tablet Take 1 tablet (200 mg total) by mouth at bedtime. 90  tablet 1   sildenafil (VIAGRA) 50 MG tablet TAKE 1/2 (ONE-HALF) TABLET BY MOUTH ONCE DAILY AS NEEDED 10 tablet 0   triamcinolone ointment (KENALOG) 0.5 % Apply 1 Application topically 2 (two) times daily. 30 g 3   valACYclovir (VALTREX) 500 MG tablet Take 1 tablet (500 mg total) by mouth daily. 30 tablet 11   No current facility-administered medications for this visit.    PHYSICAL EXAM: Vitals:   06/04/23 1119  BP: 127/84  Pulse: 64  Resp: 18  Temp: (!) 97.5 F (36.4 C)  SpO2: 97%      Awake alert and oriented Nonlabored respirations daily Varicosities of the left lower extremity marked with the patient standing  PROCEDURE: Left greater saphenous vein laser ablation totaling 33 cm and stab phlebectomy between 10 and 15 sites of the left posterior thigh and left medial calf  TECHNIQUE: [name] was taken the procedure room where she he was initially placed standing and the varicosities of the left lower extremity were marked on the skin.  He was then laid supine and sterilely prepped and draped circumferentially in standard fashion in the left lower extremity.  Timeout was called.  We began using ultrasound to identify the great saphenous vein just below the knee and the area was anesthetized with 1% lidocaine and cannulated with a micropuncture needle followed by wire and a sheath.  We then placed a J-wire up to the just short of the saphenofemoral junction and then placed  the laser introducer sheath and the laser was placed up to 33 cm under ultrasound guidance.  The length of the vein was then anesthetized with tumescent anesthesia and the vein was then ablated for a total of 33 cm and at completion the vein appeared sclerotic with ultrasound of the common femoral vein was patent and compressible.  Attention was then turned to the multiple varicosities which was a very long varicosity of the left posterior thigh and the area was anesthetized and stab incisions were created and the  vein was grabbed and removed directly with crochet hook and hemostat.  Similarly on the left medial calf several incisions were created overlying the previously marked varicosities and these were grabbed with crochet hook and removed with hemostat.  At completion Steri-Strips were placed all for the multiple stab incisions and a sterile compressive wrap was placed.  He tolerated procedure without immediate complication.  Plan will be for follow-up in 2 weeks with post ablation duplex to rule out DVT.  In the future we can evaluate for right lower extremity venous reflux given his history of symptomatic varicosities consistent with C3 venous disease.  [name] [facility] [phone]

## 2023-06-04 NOTE — Progress Notes (Signed)
     Laser Ablation Procedure    Date: 06/04/2023 [name] DOB:04/04/1965 Consent signed: Yes     Surgeon:  Dr. [name] Procedure: Laser Ablation: left Greater Saphenous Vein BP 127/84 (BP Location: Left Arm, Patient Position: Sitting, Cuff Size: Normal)   Pulse 64   Temp (!) 97.5 F (36.4 C) (Temporal)   Resp 18   Ht 6\' 2"  (1.88 m)   Wt 237 lb (107.5 kg)   SpO2 97%   BMI 30.43 kg/m   Tumescent Anesthesia: 510 cc 0.9% NaCl with 50 cc Lidocaine HCL 1%  and 15 cc 8.4% NaHCO3 Local Anesthesia: 15 cc Lidocaine HCL and NaHCO3 (ratio 2:1) 7 watts continuous mode    Total energy: 1653.4 joules    Total time: 236 seconds Treatment Length 33 cm  Laser Fiber Ref. # [id number]        Lot # [id number] Stab Phlebectomy: 10-20 Sites: Thigh and Calf  Patient tolerated procedure well  Notes:.All staff members wore facial masks. Pt had 2tab ( 1 mg) ativan at 09:25 am prior to surgical procedure  Description of Procedure: After marking the course of the secondary varicosities, the patient was placed on the operating table in the supine position, and the left leg was prepped and draped in sterile fashion.   Local anesthetic was administered and under ultrasound guidance the saphenous vein was accessed with a micro needle and guide wire; then the mirco puncture sheath was placed.  A guide wire was inserted saphenofemoral junction , followed by a 5 french sheath.  The position of the sheath and then the laser fiber below the junction was confirmed using the ultrasound.  Tumescent anesthesia was administered along the course of the saphenous vein using ultrasound guidance. The patient was placed in Trendelenburg position and protective laser glasses were placed on patient and staff, and the laser was fired at 7 watts continuous mode for a total of 1653.4 joules.  For stab phlebectomies, local anesthetic was administered at the previously marked varicosities, and tumescent anesthesia was administered  around the vessels.  Ten to 20 stab wounds were made using the tip of an 11 blade. And using the vein hook, the phlebectomies were performed using a hemostat to avulse the varicosities.  Adequate hemostasis was achieved.   Steri strips were applied to the stab wounds and ABD pads and thigh high compression stockings were applied.  Ace wrap bandages were applied over the phlebectomy sites and at the top of the saphenofemoral junction. Blood loss was less than 15 cc.  Discharge instructions reviewed with patient and hardcopy of discharge instructions given to patient to take home. The patient ambulated out of the operating room having tolerated the procedure well.

## 2023-06-18 ENCOUNTER — Ambulatory Visit (HOSPITAL_COMMUNITY)
Admission: RE | Admit: 2023-06-18 | Discharge: 2023-06-18 | Disposition: A | Discharge status: Home / Self Care | Payer: Medicare Other | Source: Ambulatory Visit | Attending: Vascular Surgery | Admitting: Vascular Surgery

## 2023-06-18 ENCOUNTER — Ambulatory Visit (INDEPENDENT_AMBULATORY_CARE_PROVIDER_SITE_OTHER): Payer: Medicare Other | Admitting: Vascular Surgery

## 2023-06-18 ENCOUNTER — Encounter: Payer: Self-pay | Admitting: Vascular Surgery

## 2023-06-18 VITALS — BP 138/92 | HR 71 | Temp 98.2°F | Resp 20 | Ht 74.0 in | Wt 249.9 lb

## 2023-06-18 DIAGNOSIS — L97909 Non-pressure chronic ulcer of unspecified part of unspecified lower leg with unspecified severity: Secondary | ICD-10-CM

## 2023-06-18 DIAGNOSIS — I83892 Varicose veins of left lower extremities with other complications: Secondary | ICD-10-CM | POA: Diagnosis not present

## 2023-06-18 DIAGNOSIS — I83891 Varicose veins of right lower extremities with other complications: Secondary | ICD-10-CM

## 2023-06-18 DIAGNOSIS — I83009 Varicose veins of unspecified lower extremity with ulcer of unspecified site: Secondary | ICD-10-CM

## 2023-06-18 NOTE — Progress Notes (Signed)
 Subjective:     Patient ID: [name], male   DOB: 1965/04/26, 58 y.o.   MRN: [id number]  HPI 58 year old male with history of C5 venous disease on the left with symptomatic varicosities.  He also has symptomatic varicosities of the right lower extremity.  He is now status post left greater saphenous vein ablation and stab phlebectomy between 10 and 15 sites.  He is recovering well states that the skin is improving of the left leg and he overall has much improvement in the pain in his left leg and his itching is improved as well.   Review of Systems As above    Objective:   Physical Exam Vitals:   06/18/23 1001  BP: (!) 138/92  Pulse: 71  Resp: 20  Temp: 98.2 F (36.8 C)  SpO2: 98%     LEFT         Reflux NoReflux Reflux  Diameter cmsComments                                          Yes    Time                                          +-------------+---------+------+---------+------------+--------------------  ----+  CFV                                             patent                     +-------------+---------+------+---------+------------+--------------------  ----+  FV prox                                          patent                     +-------------+---------+------+---------+------------+--------------------  ----+  FV mid                                           patent                     +-------------+---------+------+---------+------------+--------------------  ----+  FV dist                                          patent                     +-------------+---------+------+---------+------------+--------------------  ----+  Popliteal                                       patent                     +-------------+---------+------+---------+------------+--------------------  ----+  GSV at [facility]  patent to 2.27 cm  into                                                      GSV                        +-------------+---------+------+---------+------------+--------------------  ----+  GSV prox                                         successful ablation        thigh                                                                       +-------------+---------+------+---------+------------+--------------------  ----+  GSV mid thigh                                    successful ablation        +-------------+---------+------+---------+------------+--------------------  ----+  GSV dist                                         successful ablation        thigh                                                                       +-------------+---------+------+---------+------------+--------------------  ----+  GSV at knee                                      successful ablation        +-------------+---------+------+---------+------------+--------------------  ----+  GSV prox calf                                    successful ablation        +-------------+---------+------+---------+------------+--------------------  ----+         Summary:  Left:  - No evidence of deep vein thrombosis seen in the left lower extremity,  from the common femoral through the popliteal veins.  - Successful GSV ablation from the proximal calf to within 2.27 cm of the  SFJ.     Assessment/plan     58 year old male status post successful left greater saphenous vein ablation without evidence of DVT.  He also underwent stab phlebectomy between 10 and 15 sites and these are healing well.  Plan will be to follow-up in about 4 weeks with right lower extremity reflux testing for  symptomatic C3 venous disease.  He will continue with thigh-high compression stockings on the right as he has been doing since we initiated therapy on the left.  If he has reflux on the right would be great saphenous vein ablation  with stab ectomy again between 10 and 20 and this was discussed with the patient and his wife and they demonstrate good understanding.    Joseph Stegeman C. Randie Heinz, MD [facility] Office: [phone] Pager: [phone]

## 2023-06-19 ENCOUNTER — Other Ambulatory Visit: Payer: Self-pay | Admitting: *Deleted

## 2023-06-19 DIAGNOSIS — I872 Venous insufficiency (chronic) (peripheral): Secondary | ICD-10-CM

## 2023-06-19 DIAGNOSIS — I83891 Varicose veins of right lower extremities with other complications: Secondary | ICD-10-CM

## 2023-07-16 ENCOUNTER — Encounter (HOSPITAL_COMMUNITY)

## 2023-07-22 ENCOUNTER — Ambulatory Visit: Admitting: Pulmonary Disease

## 2023-07-22 ENCOUNTER — Ambulatory Visit: Admitting: Vascular Surgery

## 2023-08-04 ENCOUNTER — Other Ambulatory Visit: Payer: Self-pay | Admitting: Family

## 2023-08-04 DIAGNOSIS — R200 Anesthesia of skin: Secondary | ICD-10-CM

## 2023-08-04 NOTE — Progress Notes (Signed)
 The 10-year ASCVD risk score (Arnett DK, et al., 2019) is: 4.8%   Values used to calculate the score:     Age: 58 years     Sex: Male     Is Non-Hispanic African American: No     Diabetic: No     Tobacco smoker: No     Systolic Blood Pressure: 138 mmHg     Is BP treated: No     HDL Cholesterol: 68 mg/dL     Total Cholesterol: 181 mg/dL  ASCVD <1%.  Tan Clopper, [name], RN

## 2023-08-04 NOTE — Telephone Encounter (Signed)
 Please advise on refills.

## 2023-08-10 ENCOUNTER — Other Ambulatory Visit: Payer: Self-pay | Admitting: Family

## 2023-08-10 DIAGNOSIS — J45909 Unspecified asthma, uncomplicated: Secondary | ICD-10-CM

## 2023-09-07 ENCOUNTER — Other Ambulatory Visit: Payer: Self-pay | Admitting: Family

## 2023-09-07 DIAGNOSIS — R200 Anesthesia of skin: Secondary | ICD-10-CM

## 2023-09-07 NOTE — Telephone Encounter (Signed)
 Okay to refill?

## 2023-09-09 ENCOUNTER — Other Ambulatory Visit: Payer: Self-pay | Admitting: Family

## 2023-09-09 DIAGNOSIS — K2100 Gastro-esophageal reflux disease with esophagitis, without bleeding: Secondary | ICD-10-CM

## 2023-09-09 DIAGNOSIS — J45909 Unspecified asthma, uncomplicated: Secondary | ICD-10-CM

## 2023-09-09 NOTE — Telephone Encounter (Signed)
 Refilled

## 2023-09-10 NOTE — Telephone Encounter (Signed)
**Note De-identified  [place] Obfuscation** Please advise 

## 2023-10-07 ENCOUNTER — Ambulatory Visit: Admitting: Pulmonary Disease

## 2023-10-07 ENCOUNTER — Ambulatory Visit

## 2023-10-07 VITALS — BP 123/79 | HR 66 | Ht 74.0 in | Wt 243.0 lb

## 2023-10-07 DIAGNOSIS — R0602 Shortness of breath: Secondary | ICD-10-CM

## 2023-10-07 MED ORDER — ALBUTEROL SULFATE HFA 108 (90 BASE) MCG/ACT IN AERS: 2.0000 | INHALATION_SPRAY | Freq: Four times a day (QID) | RESPIRATORY_TRACT | 6 refills | Status: AC | PRN

## 2023-10-07 MED ORDER — TRELEGY ELLIPTA 200-62.5-25 MCG/ACT IN AEPB: 1.0000 | INHALATION_SPRAY | Freq: Every day | RESPIRATORY_TRACT | 3 refills | Status: AC

## 2023-10-07 NOTE — Patient Instructions (Addendum)
 Schedule for pulmonary function test, echocardiogram, chest x-ray today  Prescription for Trelegy will be sent to pharmacy for you  Prescription for albuterol  will be renewed  Graded exercises as tolerated  Make sure you work on quitting snuff use  Stop Anoro

## 2023-10-07 NOTE — Progress Notes (Signed)
 [name]    [id number]    May 25, 1965  Primary Care Physician:Pcp, No  Referring Physician: Philemon Cordella JONETTA, FNP [address] [place],  [place] [address]  Chief complaint:   Shortness of breath  HPI:  Shortness of breath has been going on for over 2 years now  Did have COVID, was not hospitalized but did go to the emergency room a couple of times for shortness of breath following the COVID  Does have a cough that is intermittently productive Not feeling acutely ill  Has been on Anoro and Trelegy recently - Ran out of medications about a week ago - Notices breathing is more stable when he was on inhalers  Only able to walk about 150 feet he stated  Has had hip replacements previously  History of HIV disease diagnosed in 1995-compliant with treatment  Has generalized anxiety disorder, GERD  Past history of smoking quit over 10 years ago Does chew snuff Past history of cocaine use  Outpatient Encounter Medications as of 10/07/2023  Medication Sig   bictegravir-emtricitabine -tenofovir  AF (BIKTARVY ) 50-200-25 MG TABS tablet Take 1 tablet by mouth daily.   diclofenac  Sodium (VOLTAREN ) 1 % GEL Apply 2 g topically 4 (four) times daily.   FLUoxetine  (PROZAC ) 40 MG capsule TAKE 1 CAPSULE (40 MG TOTAL) BY MOUTH DAILY.   gabapentin  (NEURONTIN ) 600 MG tablet TAKE 2 TABLETS (1,200 MG TOTAL) BY MOUTH 3 (THREE) TIMES DAILY.   ketoconazole  (NIZORAL ) 2 % cream APPLY TOPICALLY DAILY. ON RASH ON BOTTOM AND ABDOMEN   naproxen  (NAPROSYN ) 500 MG tablet TAKE 1 TABLET BY MOUTH TWICE A DAY WITH FOOD   omeprazole  (PRILOSEC) 20 MG capsule TAKE 1 CAPSULE BY MOUTH EVERY DAY   QUEtiapine  (SEROQUEL ) 200 MG tablet Take 1 tablet (200 mg total) by mouth at bedtime.   sildenafil  (VIAGRA ) 50 MG tablet TAKE 1/2 (ONE-HALF) TABLET BY MOUTH ONCE DAILY AS NEEDED   triamcinolone  ointment (KENALOG ) 0.5 % Apply 1 Application topically 2 (two) times daily.   valACYclovir  (VALTREX )  500 MG tablet Take 1 tablet (500 mg total) by mouth daily.   albuterol  (VENTOLIN  HFA) 108 (90 Base) MCG/ACT inhaler Inhale 2 puffs into the lungs every 6 (six) hours as needed for wheezing or shortness of breath. (Patient not taking: Reported on 10/07/2023)   ANORO ELLIPTA  62.5-25 MCG/ACT AEPB TAKE 1 PUFF BY MOUTH EVERY DAY (Patient not taking: Reported on 10/07/2023)   TRELEGY ELLIPTA  100-62.5-25 MCG/ACT AEPB INHALE 1 PUFF INTO THE LUNGS DAILY (Patient not taking: Reported on 10/07/2023)   No facility-administered encounter medications on file as of 10/07/2023.    Allergies as of 10/07/2023 - Review Complete 10/07/2023  Allergen Reaction Noted   Hydrocodone  Other (See Comments) 02/09/2018   Sulfa antibiotics Rash 01/29/2017   Tramadol Nausea Only 01/16/2015    Past Medical History:  Diagnosis Date   Anxiety    Arthritis    Asthma    Depression    History of Clostridium difficile colitis 04/22/2017   History of pulmonary embolus (PE) 03/17/2013   Overview:  post hip surgery, on blood thinners at the time   HIV (human immunodeficiency virus infection) (HCC)    Hypokalemia    Hypotension 01/25/2015   Immune deficiency disorder (HCC)    Infectious folliculitis 01/30/2017   MRSA (methicillin resistant staph aureus) culture positive    Sepsis (HCC)    Streptococcal pneumonia (HCC)     Past Surgical History:  Procedure Laterality  Date   ENDOVENOUS ABLATION SAPHENOUS VEIN W/ LASER Left 06/04/2023   endovenous laser ablation left greater saphenous vein and stab phlebectomy 10-20 incisions left leg by Penne Colorado MD   hip arthroplasty Left 2016   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, I&D with replacement in 2017   TOTAL HIP ARTHROPLASTY Right 02/09/2018   Procedure: TOTAL HIP ARTHROPLASTY ANTERIOR APPROACH;  Surgeon: Ernie Cough, MD;  Location: [facility];  Service: Orthopedics;  Laterality: Right;  70 mins    Family History  Problem Relation Age of Onset   Cancer Mother    CAD Brother      Social History   Socioeconomic History   Marital status: Divorced    Spouse name: Not on file   Number of children: 5   Years of education: 11   Highest education level: Not on file  Occupational History    Comment: na  Tobacco Use   Smoking status: Former    Types: Cigarettes   Smokeless tobacco: Current    Types: Designer, multimedia Use   Vaping status: Never Used  Substance and Sexual Activity   Alcohol use: Yes    Comment: daily to occasionally   Drug use: Yes    Types: Marijuana    Comment: socially   Sexual activity: Not on file    Comment: declined condoms  Other Topics Concern   Not on file  Social History Narrative   Lives alone   Social Drivers of Health   Financial Resource Strain: Not on file  Food Insecurity: No Food Insecurity (11/05/2021)   Hunger Vital Sign    Worried About Running Out of Food in the Last Year: Never true    Ran Out of Food in the Last Year: Never true  Transportation Needs: No Transportation Needs (11/05/2021)   PRAPARE - Administrator, Civil Service (Medical): No    Lack of Transportation (Non-Medical): No  Physical Activity: Not on file  Stress: Not on file  Social Connections: Not on file  Intimate Partner Violence: Not on file    Review of Systems  Respiratory:  Positive for shortness of breath.     Vitals:   10/07/23 0917  BP: 123/79  Pulse: 66  SpO2: 98%     Physical Exam Constitutional:      Appearance: He is obese.  HENT:     Head: Normocephalic.     Nose: Nose normal.     Mouth/Throat:     Mouth: Mucous membranes are moist.  Eyes:     General: No scleral icterus. Cardiovascular:     Rate and Rhythm: Normal rate and regular rhythm.     Heart sounds: No murmur heard.    No friction rub.  Pulmonary:     Effort: No respiratory distress.     Breath sounds: No stridor. No wheezing or rhonchi.  Musculoskeletal:     Cervical back: No rigidity or tenderness.  Neurological:     Mental Status: He  is alert.  Psychiatric:        Mood and Affect: Mood normal.    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

## 2023-10-21 ENCOUNTER — Ambulatory Visit: Admitting: Family

## 2023-11-01 ENCOUNTER — Other Ambulatory Visit: Payer: Self-pay | Admitting: Family

## 2023-11-01 DIAGNOSIS — R200 Anesthesia of skin: Secondary | ICD-10-CM

## 2023-11-04 ENCOUNTER — Ambulatory Visit: Admitting: Vascular Surgery

## 2023-11-04 ENCOUNTER — Ambulatory Visit (HOSPITAL_COMMUNITY)

## 2023-11-19 ENCOUNTER — Other Ambulatory Visit: Payer: Self-pay | Admitting: Family

## 2023-11-19 NOTE — Telephone Encounter (Signed)
 Patient due for appointment, per provider's note patient should be taking Seroquel  with fluoxetine . Called [name] to confirm that he is taking both and to schedule follow up, no answer. Left HIPAA compliant voicemail requesting callback.   Shahidah Nesbitt, [name], RN

## 2023-11-24 NOTE — Telephone Encounter (Signed)
 Attempted to call patient. Call cannot be completed

## 2023-12-01 ENCOUNTER — Encounter

## 2023-12-01 ENCOUNTER — Ambulatory Visit: Admitting: Pulmonary Disease

## 2023-12-01 DIAGNOSIS — R0602 Shortness of breath: Secondary | ICD-10-CM

## 2023-12-03 ENCOUNTER — Telehealth: Payer: Self-pay

## 2023-12-03 NOTE — Telephone Encounter (Signed)
 Left patient a voice mail to call back to schedule an appointment with Gregory Calone.

## 2023-12-13 ENCOUNTER — Other Ambulatory Visit: Payer: Self-pay | Admitting: Family

## 2024-01-01 ENCOUNTER — Other Ambulatory Visit: Payer: Self-pay | Admitting: Family

## 2024-01-01 DIAGNOSIS — R200 Anesthesia of skin: Secondary | ICD-10-CM

## 2024-01-01 NOTE — Telephone Encounter (Signed)
 Ok to refill

## 2024-01-16 DEATH — deceased
